# Patient Record
Sex: Male | Born: 1945
Health system: Southern US, Community
[De-identification: ages and names within clinical notes are randomized; demographics above are authoritative.]

## PROBLEM LIST (undated history)

## (undated) DIAGNOSIS — Z86018 Personal history of other benign neoplasm: Secondary | ICD-10-CM

## (undated) DIAGNOSIS — C3490 Malignant neoplasm of unspecified part of unspecified bronchus or lung: Secondary | ICD-10-CM

## (undated) DIAGNOSIS — R0600 Dyspnea, unspecified: Secondary | ICD-10-CM

## (undated) DIAGNOSIS — I219 Acute myocardial infarction, unspecified: Secondary | ICD-10-CM

## (undated) DIAGNOSIS — J9819 Other pulmonary collapse: Secondary | ICD-10-CM

## (undated) DIAGNOSIS — D689 Coagulation defect, unspecified: Secondary | ICD-10-CM

## (undated) DIAGNOSIS — M47816 Spondylosis without myelopathy or radiculopathy, lumbar region: Secondary | ICD-10-CM

## (undated) DIAGNOSIS — J189 Pneumonia, unspecified organism: Secondary | ICD-10-CM

## (undated) DIAGNOSIS — J449 Chronic obstructive pulmonary disease, unspecified: Secondary | ICD-10-CM

## (undated) DIAGNOSIS — J45909 Unspecified asthma, uncomplicated: Secondary | ICD-10-CM

## (undated) DIAGNOSIS — J939 Pneumothorax, unspecified: Secondary | ICD-10-CM

## (undated) DIAGNOSIS — A310 Pulmonary mycobacterial infection: Secondary | ICD-10-CM

## (undated) DIAGNOSIS — J9383 Other pneumothorax: Secondary | ICD-10-CM

## (undated) HISTORY — DX: Spondylosis without myelopathy or radiculopathy, lumbar region: M47.816

## (undated) HISTORY — DX: Pneumothorax, unspecified: J93.9

## (undated) HISTORY — DX: Other pneumothorax: J93.83

## (undated) HISTORY — PX: OTHER SURGICAL HISTORY: SHX169

## (undated) HISTORY — DX: Pneumonia, unspecified organism: J18.9

## (undated) HISTORY — DX: Malignant neoplasm of unspecified part of unspecified bronchus or lung: C34.90

## (undated) HISTORY — DX: Dyspnea, unspecified: R06.00

## (undated) HISTORY — DX: Other pulmonary collapse: J98.19

## (undated) HISTORY — DX: Unspecified asthma, uncomplicated: J45.909

## (undated) HISTORY — DX: Personal history of other benign neoplasm: Z86.018

## (undated) HISTORY — PX: APPENDECTOMY: SHX54

## (undated) HISTORY — DX: Acute myocardial infarction, unspecified: I21.9

## (undated) HISTORY — DX: Chronic obstructive pulmonary disease, unspecified: J44.9

## (undated) HISTORY — DX: Coagulation defect, unspecified: D68.9

## (undated) HISTORY — DX: Pulmonary mycobacterial infection: A31.0

## (undated) HISTORY — PX: CHEST TUBE INSERTION: SHX231

---

## 1999-10-20 ENCOUNTER — Encounter: Payer: Self-pay | Admitting: Thoracic Surgery

## 1999-10-22 ENCOUNTER — Encounter (INDEPENDENT_AMBULATORY_CARE_PROVIDER_SITE_OTHER): Payer: Self-pay | Admitting: *Deleted

## 1999-10-22 ENCOUNTER — Ambulatory Visit (HOSPITAL_COMMUNITY): Admission: RE | Admit: 1999-10-22 | Discharge: 1999-10-22 | Payer: Self-pay | Admitting: Thoracic Surgery

## 1999-10-29 ENCOUNTER — Encounter: Payer: Self-pay | Admitting: Thoracic Surgery

## 1999-10-31 ENCOUNTER — Inpatient Hospital Stay (HOSPITAL_COMMUNITY): Admission: RE | Admit: 1999-10-31 | Discharge: 1999-11-07 | Payer: Self-pay | Admitting: Thoracic Surgery

## 1999-10-31 ENCOUNTER — Encounter: Payer: Self-pay | Admitting: Thoracic Surgery

## 1999-10-31 ENCOUNTER — Encounter (INDEPENDENT_AMBULATORY_CARE_PROVIDER_SITE_OTHER): Payer: Self-pay | Admitting: Specialist

## 1999-11-01 ENCOUNTER — Encounter: Payer: Self-pay | Admitting: Thoracic Surgery

## 1999-11-02 ENCOUNTER — Encounter: Payer: Self-pay | Admitting: Thoracic Surgery

## 1999-11-03 ENCOUNTER — Encounter: Payer: Self-pay | Admitting: Thoracic Surgery

## 1999-11-04 ENCOUNTER — Encounter: Payer: Self-pay | Admitting: Thoracic Surgery

## 1999-11-05 ENCOUNTER — Encounter: Payer: Self-pay | Admitting: Thoracic Surgery

## 1999-11-06 ENCOUNTER — Encounter: Payer: Self-pay | Admitting: Thoracic Surgery

## 1999-11-07 ENCOUNTER — Encounter: Payer: Self-pay | Admitting: Thoracic Surgery

## 1999-11-12 ENCOUNTER — Encounter: Admission: RE | Admit: 1999-11-12 | Discharge: 1999-11-12 | Payer: Self-pay | Admitting: Thoracic Surgery

## 1999-11-12 ENCOUNTER — Encounter: Payer: Self-pay | Admitting: Thoracic Surgery

## 1999-11-26 ENCOUNTER — Encounter: Admission: RE | Admit: 1999-11-26 | Discharge: 1999-11-26 | Payer: Self-pay | Admitting: Thoracic Surgery

## 1999-11-26 ENCOUNTER — Encounter: Payer: Self-pay | Admitting: Thoracic Surgery

## 2000-02-06 ENCOUNTER — Encounter: Admission: RE | Admit: 2000-02-06 | Discharge: 2000-02-06 | Payer: Self-pay | Admitting: Thoracic Surgery

## 2000-02-06 ENCOUNTER — Encounter: Payer: Self-pay | Admitting: Thoracic Surgery

## 2000-05-14 ENCOUNTER — Encounter: Payer: Self-pay | Admitting: Thoracic Surgery

## 2000-05-14 ENCOUNTER — Encounter: Admission: RE | Admit: 2000-05-14 | Discharge: 2000-05-14 | Payer: Self-pay | Admitting: Thoracic Surgery

## 2000-08-13 ENCOUNTER — Encounter: Payer: Self-pay | Admitting: Thoracic Surgery

## 2000-08-13 ENCOUNTER — Encounter: Admission: RE | Admit: 2000-08-13 | Discharge: 2000-08-13 | Payer: Self-pay | Admitting: Thoracic Surgery

## 2000-12-15 ENCOUNTER — Encounter: Payer: Self-pay | Admitting: Thoracic Surgery

## 2000-12-15 ENCOUNTER — Encounter: Admission: RE | Admit: 2000-12-15 | Discharge: 2000-12-15 | Payer: Self-pay | Admitting: Thoracic Surgery

## 2001-06-14 ENCOUNTER — Encounter: Payer: Self-pay | Admitting: Thoracic Surgery

## 2001-06-14 ENCOUNTER — Encounter: Admission: RE | Admit: 2001-06-14 | Discharge: 2001-06-14 | Payer: Self-pay | Admitting: Thoracic Surgery

## 2001-09-14 ENCOUNTER — Encounter: Admission: RE | Admit: 2001-09-14 | Discharge: 2001-09-14 | Payer: Self-pay | Admitting: Thoracic Surgery

## 2001-09-14 ENCOUNTER — Encounter: Payer: Self-pay | Admitting: Thoracic Surgery

## 2002-03-08 ENCOUNTER — Encounter: Admission: RE | Admit: 2002-03-08 | Discharge: 2002-03-08 | Payer: Self-pay | Admitting: Thoracic Surgery

## 2002-03-08 ENCOUNTER — Encounter: Payer: Self-pay | Admitting: Thoracic Surgery

## 2002-07-31 ENCOUNTER — Ambulatory Visit (HOSPITAL_COMMUNITY): Admission: RE | Admit: 2002-07-31 | Discharge: 2002-07-31 | Payer: Self-pay | Admitting: Internal Medicine

## 2002-09-14 ENCOUNTER — Encounter: Admission: RE | Admit: 2002-09-14 | Discharge: 2002-09-14 | Payer: Self-pay | Admitting: Thoracic Surgery

## 2002-09-14 ENCOUNTER — Encounter: Payer: Self-pay | Admitting: Thoracic Surgery

## 2002-09-29 ENCOUNTER — Encounter: Payer: Self-pay | Admitting: Thoracic Surgery

## 2002-09-29 ENCOUNTER — Encounter: Admission: RE | Admit: 2002-09-29 | Discharge: 2002-09-29 | Payer: Self-pay | Admitting: Thoracic Surgery

## 2002-10-31 ENCOUNTER — Encounter: Payer: Self-pay | Admitting: Thoracic Surgery

## 2002-10-31 ENCOUNTER — Encounter: Admission: RE | Admit: 2002-10-31 | Discharge: 2002-10-31 | Payer: Self-pay | Admitting: Thoracic Surgery

## 2003-05-17 ENCOUNTER — Encounter: Admission: RE | Admit: 2003-05-17 | Discharge: 2003-05-17 | Payer: Self-pay | Admitting: Thoracic Surgery

## 2003-05-17 ENCOUNTER — Encounter: Payer: Self-pay | Admitting: Thoracic Surgery

## 2003-11-20 ENCOUNTER — Encounter: Admission: RE | Admit: 2003-11-20 | Discharge: 2003-11-20 | Payer: Self-pay | Admitting: Thoracic Surgery

## 2004-05-20 ENCOUNTER — Encounter: Admission: RE | Admit: 2004-05-20 | Discharge: 2004-05-20 | Payer: Self-pay | Admitting: Thoracic Surgery

## 2004-06-04 ENCOUNTER — Ambulatory Visit (HOSPITAL_COMMUNITY): Admission: RE | Admit: 2004-06-04 | Discharge: 2004-06-04 | Payer: Self-pay | Admitting: Internal Medicine

## 2005-04-06 ENCOUNTER — Encounter: Payer: Self-pay | Admitting: General Surgery

## 2005-04-06 ENCOUNTER — Inpatient Hospital Stay (HOSPITAL_COMMUNITY): Admission: EM | Admit: 2005-04-06 | Discharge: 2005-04-09 | Payer: Self-pay | Admitting: Emergency Medicine

## 2005-04-13 ENCOUNTER — Inpatient Hospital Stay: Payer: Self-pay | Admitting: General Surgery

## 2005-04-20 ENCOUNTER — Ambulatory Visit: Payer: Self-pay | Admitting: General Surgery

## 2005-04-24 ENCOUNTER — Inpatient Hospital Stay (HOSPITAL_COMMUNITY): Admission: EM | Admit: 2005-04-24 | Discharge: 2005-05-06 | Payer: Self-pay | Admitting: Emergency Medicine

## 2005-04-25 ENCOUNTER — Ambulatory Visit: Payer: Self-pay | Admitting: Internal Medicine

## 2005-04-30 ENCOUNTER — Encounter (INDEPENDENT_AMBULATORY_CARE_PROVIDER_SITE_OTHER): Payer: Self-pay | Admitting: Specialist

## 2005-05-13 ENCOUNTER — Encounter: Admission: RE | Admit: 2005-05-13 | Discharge: 2005-05-13 | Payer: Self-pay | Admitting: Thoracic Surgery

## 2005-06-03 ENCOUNTER — Encounter: Admission: RE | Admit: 2005-06-03 | Discharge: 2005-06-03 | Payer: Self-pay | Admitting: Thoracic Surgery

## 2005-07-15 ENCOUNTER — Encounter: Admission: RE | Admit: 2005-07-15 | Discharge: 2005-07-15 | Payer: Self-pay | Admitting: Thoracic Surgery

## 2005-07-17 ENCOUNTER — Ambulatory Visit: Payer: Self-pay | Admitting: Internal Medicine

## 2005-09-15 ENCOUNTER — Ambulatory Visit: Payer: Self-pay | Admitting: Internal Medicine

## 2005-10-14 ENCOUNTER — Encounter: Admission: RE | Admit: 2005-10-14 | Discharge: 2005-10-14 | Payer: Self-pay | Admitting: Thoracic Surgery

## 2006-02-17 ENCOUNTER — Encounter: Admission: RE | Admit: 2006-02-17 | Discharge: 2006-02-17 | Payer: Self-pay | Admitting: Thoracic Surgery

## 2006-03-15 ENCOUNTER — Ambulatory Visit: Payer: Self-pay | Admitting: Internal Medicine

## 2006-03-30 ENCOUNTER — Ambulatory Visit: Payer: Self-pay | Admitting: Internal Medicine

## 2006-07-13 ENCOUNTER — Ambulatory Visit: Payer: Self-pay | Admitting: Internal Medicine

## 2007-01-10 ENCOUNTER — Ambulatory Visit: Payer: Self-pay | Admitting: Internal Medicine

## 2007-05-10 ENCOUNTER — Ambulatory Visit: Payer: Self-pay | Admitting: Internal Medicine

## 2007-10-31 DIAGNOSIS — J930 Spontaneous tension pneumothorax: Secondary | ICD-10-CM

## 2007-10-31 DIAGNOSIS — J939 Pneumothorax, unspecified: Secondary | ICD-10-CM | POA: Insufficient documentation

## 2007-11-01 ENCOUNTER — Ambulatory Visit: Payer: Self-pay | Admitting: Internal Medicine

## 2007-11-06 DIAGNOSIS — J449 Chronic obstructive pulmonary disease, unspecified: Secondary | ICD-10-CM

## 2007-11-18 DIAGNOSIS — R0602 Shortness of breath: Secondary | ICD-10-CM

## 2008-04-30 ENCOUNTER — Ambulatory Visit: Payer: Self-pay | Admitting: Internal Medicine

## 2008-04-30 DIAGNOSIS — C3492 Malignant neoplasm of unspecified part of left bronchus or lung: Secondary | ICD-10-CM

## 2008-05-10 ENCOUNTER — Telehealth (INDEPENDENT_AMBULATORY_CARE_PROVIDER_SITE_OTHER): Payer: Self-pay | Admitting: *Deleted

## 2008-07-10 ENCOUNTER — Ambulatory Visit: Payer: Self-pay | Admitting: Internal Medicine

## 2008-07-10 ENCOUNTER — Ambulatory Visit (HOSPITAL_COMMUNITY): Admission: RE | Admit: 2008-07-10 | Discharge: 2008-07-10 | Payer: Self-pay | Admitting: Internal Medicine

## 2008-11-28 ENCOUNTER — Ambulatory Visit: Payer: Self-pay | Admitting: Internal Medicine

## 2008-12-11 ENCOUNTER — Ambulatory Visit: Payer: Self-pay | Admitting: Internal Medicine

## 2009-01-23 ENCOUNTER — Telehealth: Payer: Self-pay | Admitting: Internal Medicine

## 2009-04-11 ENCOUNTER — Ambulatory Visit: Payer: Self-pay | Admitting: Internal Medicine

## 2009-06-04 ENCOUNTER — Ambulatory Visit (HOSPITAL_COMMUNITY): Admission: RE | Admit: 2009-06-04 | Discharge: 2009-06-04 | Payer: Self-pay | Admitting: Internal Medicine

## 2009-10-18 ENCOUNTER — Telehealth: Payer: Self-pay | Admitting: Internal Medicine

## 2009-11-07 ENCOUNTER — Ambulatory Visit: Payer: Self-pay | Admitting: Internal Medicine

## 2010-04-22 ENCOUNTER — Telehealth (INDEPENDENT_AMBULATORY_CARE_PROVIDER_SITE_OTHER): Payer: Self-pay | Admitting: *Deleted

## 2010-06-06 ENCOUNTER — Ambulatory Visit: Payer: Self-pay | Admitting: Internal Medicine

## 2010-10-19 ENCOUNTER — Encounter: Payer: Self-pay | Admitting: Internal Medicine

## 2010-10-28 NOTE — Progress Notes (Signed)
Summary: prescript  Phone Note Call from Patient   Caller: Patient Call For: young Summary of Call: pt need prednisone called to [facility] Initial call taken by: Rickard Patience,  October 18, 2009 8:52 AM  Follow-up for Phone Call        Pt c/o increase SOB and productive cough x 3-4 weeks. Cough is productive with yellow phlegm.  Pt request refill on prednisone. Please advise. Carron Curie CMA  October 18, 2009 9:03 AM allergies: NKDA  Additional Follow-up for Phone Call Additional follow up Details #1::        pred 10 mg, # 20 1 tab four times daily x 2 days, 3 times daily x 2 days, 2 times daily x 2 days, 1 time daily x 2 days  No refill Additional Follow-up by: Waymon Budge MD,  October 18, 2009 9:10 AM    Additional Follow-up for Phone Call Additional follow up Details #2::    rx sent. pt aware.Carron Curie CMA  October 18, 2009 9:26 AM   Prescriptions: PREDNISONE 10 MG TABS (PREDNISONE) 1 tab four times daily x 2 days, 3 times daily x 2 days, 2 times daily x 2 days, 1 time daily x 2 days  #20 x 0   Entered by:   Carron Curie CMA   Authorized by:   Waymon Budge MD   Signed by:   Carron Curie CMA on 10/18/2009   Method used:   [facility] to        [id number]       [address]       [place], [place]  [address]       Ph: [phone] or [phone]       Fax: [phone]   RxID:   [id number]

## 2010-10-28 NOTE — Assessment & Plan Note (Signed)
Summary: F/U 6 MONTHS///KP   Primary Provider/Referring Provider:  Carylon Perches  CC:  6 month follow up-breathing doing better; "cold weather giving me a fit".  History of Present Illness:  11/28/08-  COPD, Hx small cell ca, Hx pneumothorax Notices little wheeze, but stable exertional dyspnea with lifting or steady walking. Combivent used about once daily, not as effective as it used to be. Still using Advair. Got both flu shots .Denies cough/ phlegm, chest pain/ palpitation/ edema.  04/11/09-COPD, hx small cell CA, hx pneumothorax. About to vacation in [place]- at altitude and smokers in casino. Asks prednisone to carry. Feels washed out in the current heat. Remains easily dyspneic with hills and steps. Uses Combivent 1-2 x daily. Coughs occasional scant white mucus. Denies blood, chest pain, purulent, palpitation, glands, edema. PFT- Moderate obstuctive disease with some response to dilator. FEV1/FVC 0.56. - 98/98, 99%; 429 meters. Room air sat today at rest 98%.  .November 07, 2009- COPD, Hx Small Cell Ca, Hx pneumothorax. Cold air tightens him some. Had early flu shot in September and he asked about getting a booster, but he decided he didn't care to do that. . Had a bronchitis needing prednisone a couple of weeks ago, fully resolved and back to baseline. Little cough and phlegm- not much.   Current Medications (verified): 1)  Advair Diskus 250-50 Mcg/dose  Misc (Fluticasone-Salmeterol) .Marland Kitchen.. 1 Puff Two Times A Day 2)  Lortab 5 5-500 Mg  Tabs (Hydrocodone-Acetaminophen) .... As Needed 3)  Combivent 103-18 Mcg/act  Aero (Ipratropium-Albuterol) .... Use As Directed As Needed 4)  Tylenol 325 Mg Tabs (Acetaminophen) .... To Use As Needed  Allergies (verified): No Known Drug Allergies  Past History:  Past Medical History: Last updated: 11/28/2008 DYSPNEA (ICD-786.05) C O P D (ICD-496) SMALL CELL CARCINOMA OF THE LUNG (ICD-162.9) SPONTANEOUS PNEUMOTHORAX (ICD-512.8)  Past  Surgical History: Last updated: 11/01/2007 left Upper lobectomy for Small Cell Lung Cancer Chest tube, VATS R thoracotomy- resection of bullaew Benign thyroid nodule resected appendectomy  Family History: Last updated: 04/30/2008 Mother - heart disease Father - colon cancer  Social History: Last updated: 11/01/2007 Patient states former smoker. 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  The patient denies anorexia, fever, weight loss, weight gain, vision loss, decreased hearing, hoarseness, chest pain, syncope, peripheral edema, prolonged cough, headaches, hemoptysis, abdominal pain, and severe indigestion/heartburn.         Dyspnea on hills.  Vital Signs:  Patient profile:   65 year old male Height:      67 inches Weight:      186.38 pounds O2 Sat:      96 % on Room air Pulse rate:   88 / minute BP sitting:   120 / 80  (left arm) Cuff size:   regular  Vitals Entered By: Reynaldo Minium CMA (November 07, 2009 9:56 AM)  O2 Flow:  Room air CC: 6 month follow up-breathing doing better; "cold weather giving me a fit" Comments Medications reviewed with patient Renold Genta RCP, LPN  November 07, 2009 9:33 AM    Physical Exam  Additional Exam:  General: A/Ox3; pleasant and cooperative, NAD, quiet spoken SKIN: no rash, lesions NODES: no lymphadenopathy HEENT: [place]/AT, EOM- WNL, Conjuctivae- clear, PERRLA, TM-WNL, Nose- clear, Throat- clear and wnl. [place]  II NECK: Supple w/ fair ROM, JVD-  none, normal carotid impulses w/o bruits Thyroid-  CHEST: Clear to P&A HEART: RRR, no m/g/r heard ABDOMEN: Soft and nl;  IRS:WNIO, nl pulses, no edema  NEURO: Grossly intact to observation      Impression & Recommendations:  Problem # 1:  C O P D (ICD-496) Recent bronchitis resolved. Reasonable exercise tolerance and no changes, no  symptoms suggesting cardiac issues. He understands cold air precautions.  Medications Added to Medication List This Visit: 1)  Prednisone 10 Mg Tabs (Prednisone) .Marland Kitchen.. 1 tab four times daily x 2 days, 3 times daily x 2 days, 2 times daily x 2 days, 1 time daily x 2 days  Other Orders: Est. Patient Level II (27035)  Patient Instructions: 1)  Please schedule a follow-up appointment in 6 months. 2)  Call sooner as needed. 3)  Prednisone taper to hold for travel Prescriptions: PREDNISONE 10 MG TABS (PREDNISONE) 1 tab four times daily x 2 days, 3 times daily x 2 days, 2 times daily x 2 days, 1 time daily x 2 days  #20 x 1   Entered and Authorized by:   Waymon Budge MD   Signed by:   Waymon Budge MD on 11/07/2009   Method used:   Print then Give to Patient   RxID:   [id number]

## 2010-10-28 NOTE — Assessment & Plan Note (Signed)
Summary: rov 6 months///kp   Primary Provider/Referring Provider:  Carylon Perches  CC:  6 month follow up visit-slight SOB today(flares up when around perfumes and smoke)..  History of Present Illness:  04/11/09-COPD, hx small cell CA, hx pneumothorax. About to vacation in [place]- at altitude and smokers in casino. Asks prednisone to carry. Feels washed out in the current heat. Remains easily dyspneic with hills and steps. Uses Combivent 1-2 x daily. Coughs occasional scant white mucus. Denies blood, chest pain, purulent, palpitation, glands, edema. PFT- Moderate obstuctive disease with some response to dilator. FEV1/FVC 0.56. - 98/98, 99%; 429 meters. Room air sat today at rest 98%.  June 06, 2010- COPD, hx small Cell CA, hx PTX Had good report on recent phy exam by Dr Ouida Sills. [place], exposed to smoke about 10 days ago- dyspnea persisted for a day or two. He took a prednisone burst. Has taken 2-3 prednisone bursts this year. Feels a little tight today, but no cough or phlegm, chest pain or fever. Feet don't swell. Dyspnea still limiting trying to walk up stairs or hills.  Last Combivent use yesterday evening, with increased use in past week.     Preventive Screening-Counseling & Management  Alcohol-Tobacco     Smoking Status: quit > 6 months     Year Quit: 1996     Pack years: 3 ppd x 15 years  Current Medications (verified): 1)  Advair Diskus 250-50 Mcg/dose  Misc (Fluticasone-Salmeterol) .Marland Kitchen.. 1 Puff Two Times A Day 2)  Lortab 5 5-500 Mg  Tabs (Hydrocodone-Acetaminophen) .... As Needed 3)  Combivent 103-18 Mcg/act  Aero (Ipratropium-Albuterol) .... Use As Directed As Needed 4)  Tylenol 325 Mg Tabs (Acetaminophen) .... To Use As Needed  Allergies (verified): No Known Drug Allergies  Past History:  Family History: Last updated: 04/30/2008 Mother - heart disease Father - colon cancer  Social History: Last updated: 11/01/2007 Patient  states former smoker. 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  The patient denies shortness of breath at rest, productive cough, non-productive cough, coughing up blood, chest pain, irregular heartbeats, acid heartburn, indigestion, loss of appetite, weight change, abdominal pain, difficulty swallowing, sore throat, tooth/dental problems, headaches, nasal congestion/difficulty breathing through nose, and sneezing.    Vital Signs:  Patient profile:   65 year old male Height:      67 inches Weight:      176.13 pounds BMI:     27.69 O2 Sat:      100 % on Room air Pulse rate:   78 / minute BP sitting:   124 / 68  (left arm) Cuff size:   regular  Vitals Entered By: Reynaldo Minium CMA (June 06, 2010 11:03 AM)  O2 Flow:  Room air CC: 6 month follow up visit-slight SOB today(flares up when around perfumes and smoke).   Physical Exam  Additional Exam:  General: A/Ox3; pleasant and cooperative, NAD, quiet spoken SKIN: no rash, lesions NODES: no lymphadenopathy HEENT: [place]/AT, EOM- WNL, Conjuctivae- clear, PERRLA, TM-WNL, Nose- clear, Throat- clear and wnl. Mallampati  II NECK: Supple w/ fair ROM, JVD- none, normal carotid impulses w/o bruits Thyroid-  CHEST: Clear to P&A, distant HEART: RRR, no m/g/r heard ABDOMEN: Soft and nl;  ZOX:WRUE, nl pulses, no edema  NEURO: Grossly intact to observation      Impression & Recommendations:  Problem # 1:  C O P D  (ICD-496) Exacerbation after irritant smoke exposure. We discussed steroids. Prednisone bursts help but he needed refresher discussion on steroid side effects which we did. I will give sample Dulera to see if that can clear him back to comfort without systemic steroids. He would like flu shot.   Problem # 2:  Hx of SMALL CELL CARCINOMA OF THE LUNG (ICD-162.9) No recurrence after VATS resection. Long term surveillance.  Problem # 3:  SPONTANEOUS PNEUMOTHORAX (ICD-512.8) Hx recurrent spontaneous PTX with chest tube. pleurodesis.  Medications Added to Medication List This Visit: 1)  Zithromax Z-pak 250 Mg Tabs (Azithromycin) .... 2 today then one daily  Other Orders: Est. Patient Level III (45409) Flu Vaccine 38yrs + MEDICARE PATIENTS (W1191) Administration Flu vaccine - MCR (Y7829)  Patient Instructions: 1)  Please schedule a follow-up appointment in 6 months. 2)  Try sample Dulera 200-5, 2 puffs and rinse mouth twice every day. When you have used this up, go back to Advair.  3)  Flu vax 4)  Script to hold for antibiotic to take if needed Prescriptions: ZITHROMAX Z-PAK 250 MG TABS (AZITHROMYCIN) 2 today then one daily  #1 pak x 0   Entered and Authorized by:   Waymon Budge MD   Signed by:   Waymon Budge MD on 06/06/2010   Method used:   Print then Give to Patient   RxID:   [id number]    Flu Vaccine Consent Questions     Do you have a history of severe allergic reactions to this vaccine? no    Any prior history of allergic reactions to egg and/or gelatin? no    Do you have a sensitivity to the preservative Thimersol? no    Do you have a past history of Guillan-Barre Syndrome? no    Do you currently have an acute febrile illness? no    Have you ever had a severe reaction to latex? no    Vaccine information given and explained to patient? yes    Are you currently pregnant? no    Lot Number:[id number]   Exp Date:03/28/2011   Site Given  Left Deltoid IMflu   Randell Loop  [facility]  June 06, 2010 12:08 PM

## 2010-10-28 NOTE — Progress Notes (Signed)
Summary: waiting on prednisone  Phone Note Call from Patient Call back at Home Phone [phone]   Caller: Patient Call For: young Summary of Call: pt says pharmacy is waiting to hear back re: [facility]ill[facility] [place] Initial call taken by: Tivis Ringer, CNA,  April 22, 2010 2:26 PM  Follow-up for Phone Call        called and spoke with pt.  pt states pharmacy never got refill that we show in our system from 04-01-2010.  pt states he last had refill on prednisone on 02-03-2010.  pt requesting a refill on the prednisone to have on hand when he [place] trips to the mountains.  will forward message to CY to address.  Arman Filter LPN  April 22, 2010 2:37 PM   Additional Follow-up for Phone Call Additional follow up Details #1::        ok per CY to send rx to pharmacy.  pt aware rx sent.  Aundra Millet Reynolds LPN  April 22, 2010 3:37 PM     Prescriptions: PREDNISONE 10 MG TABS (PREDNISONE) 1 tab four times daily x 2 days, 3 times daily x 2 days, 2 times daily x 2 days, 1 time daily x 2 days  #20 Tablet x 0   Entered by:   Arman Filter LPN   Authorized by:   Waymon Budge MD   Signed by:   Arman Filter LPN on 09/81/1914   Method used:   [facility]        [id number]       [address]       [place], [place]  [address]       Ph: [phone] or [phone]       Fax: [phone]   RxID:   [id number]

## 2010-12-03 ENCOUNTER — Ambulatory Visit (INDEPENDENT_AMBULATORY_CARE_PROVIDER_SITE_OTHER)
Admission: RE | Admit: 2010-12-03 | Discharge: 2010-12-03 | Disposition: A | Discharge status: Home / Self Care | Payer: Self-pay | Source: Ambulatory Visit | Attending: Internal Medicine | Admitting: Internal Medicine

## 2010-12-03 ENCOUNTER — Other Ambulatory Visit: Payer: Self-pay | Admitting: Internal Medicine

## 2010-12-03 ENCOUNTER — Ambulatory Visit (INDEPENDENT_AMBULATORY_CARE_PROVIDER_SITE_OTHER): Payer: Medicare Other | Admitting: Internal Medicine

## 2010-12-03 ENCOUNTER — Encounter: Payer: Self-pay | Admitting: Internal Medicine

## 2010-12-03 DIAGNOSIS — C3490 Malignant neoplasm of unspecified part of unspecified bronchus or lung: Secondary | ICD-10-CM

## 2010-12-03 DIAGNOSIS — J449 Chronic obstructive pulmonary disease, unspecified: Secondary | ICD-10-CM

## 2010-12-09 NOTE — Assessment & Plan Note (Signed)
Summary: follow up   Primary Provider/Referring Provider:  Carylon Perches  CC:  Follow up visit-COPD; slight wheezing, chest tightness, and slight cough-clear in color; had fever on Monday.Marland Kitchen  History of Present Illness:  04/11/09-COPD, hx small cell CA, hx pneumothorax. About to vacation in [place]- at [place] and smokers in [place]. Asks prednisone to carry. Feels washed out in the current heat. Remains easily dyspneic with hills and steps. Uses Combivent 1-2 x daily. Coughs occasional scant white mucus. Denies blood, chest pain, purulent, palpitation, glands, edema. PFT- Moderate obstuctive disease with some response to dilator. FEV1/FVC 0.56. - 98/98, 99%; 429 meters. Room air sat today at rest 98%.  June 06, 2010- COPD, hx small Cell CA, hx PTX Had good report on recent phy exam by Dr Ouida Sills. [place], exposed to smoke about 10 days ago- dyspnea persisted for a day or two. He took a prednisone burst. Has taken 2-3 prednisone bursts this year. Feels a little tight today, but no cough or phlegm, chest pain or fever. Feet don't swell. Dyspnea still limiting trying to walk up stairs or hills.  Last Combivent use yesterday evening, with increased use in past week.   December 03, 2010- COPD, hx small Cell CA, hx PTX Nurse-CC: Follow up visit-COPD; slight wheezing, chest tightness, slight cough-clear in color; had fever on Monday. Acute- 1 week definite cold w/ head anc chest congestion, productive cough, clear mucus nose and chest, some fever/ chill. Took mucinex and tylenol-may have helped. Slowly better. Still tight chest and short of breath. Using Combivent more- 3 to 4 x/ day. Last prednisone was in January.     Preventive Screening-Counseling & Management  Alcohol-Tobacco     Smoking Status: quit > 6 months     Year Quit: 1996     Pack years: 3 ppd x 15 years  Current Medications (verified): 1)  Advair Diskus 250-50 Mcg/dose  Misc  (Fluticasone-Salmeterol) .Marland Kitchen.. 1 Puff Two Times A Day 2)  Lortab 5 5-500 Mg  Tabs (Hydrocodone-Acetaminophen) .... As Needed 3)  Combivent 103-18 Mcg/act  Aero (Ipratropium-Albuterol) .... Use As Directed As Needed 4)  Tylenol 325 Mg Tabs (Acetaminophen) .... To Use As Needed  Allergies (verified): No Known Drug Allergies  Past History:  Past Surgical History: Last updated: 06/06/2010 left Upper lobectomy for Small Cell Lung Cancer Chest tube, VATS R thoracotomy- resection of bullae Benign thyroid nodule resected appendectomy  Family History: Last updated: 04/30/2008 Mother - heart disease Father - colon cancer  Social History: Last updated: 12/03/2010 Patient states former smoker. 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 retired Production designer, theatre/television/film of paper company Married - 1 daughter/ a Engineer, civil (consulting)  Review of Systems      See HPI       The patient complains of shortness of breath with activity, shortness of breath at rest, productive cough, nasal congestion/difficulty breathing through nose, itching, ear ache, hand/feet swelling, joint stiffness or pain, rash, and change in color of mucus.  The patient denies non-productive cough, coughing up blood, chest pain, irregular heartbeats, acid heartburn, indigestion, loss of  appetite, weight change, abdominal pain, difficulty swallowing, sore throat, tooth/dental problems, headaches, sneezing, anxiety, depression, and fever.    Vital Signs:  Patient profile:   65 year old male Height:      67 inches Weight:      183 pounds BMI:     28.77 O2 Sat:      95 % on Room air Pulse rate:   76 /  minute BP sitting:   116 / 78  (left arm) Cuff size:   regular  Vitals Entered By: Reynaldo Minium CMA (December 03, 2010 8:59 AM)  O2 Flow:  Room air CC: Follow up visit-COPD; slight wheezing, chest tightness, slight cough-clear in color; had fever on Monday.   Physical Exam  Additional Exam:  General: A/Ox3; pleasant and cooperative, NAD, quiet spoken SKIN: no rash, lesions NODES: no lymphadenopathy HEENT: [place]/AT, EOM- WNL, Conjuctivae- clear, PERRLA, TM-WNL, Nose- clear, Throat- clear and wnl, not red, no exudate. Mallampati  II NECK: Supple w/ fair ROM, JVD- none, normal carotid impulses w/o bruits Thyroid-  CHEST: Clear to P&A, distant. Dry cough repeatedly, no wheeze.  HEART: RRR, no m/g/r heard ABDOMEN: Soft and nl;  EAV:WUJW, nl pulses, no edema  NEURO: Grossly intact to observation      Impression & Recommendations:  Problem # 1:  C O P D (ICD-496) Acute exacerbation by viral syndrome URI with bronchitis. Not purulent. We discussed options and will respect his lack of reserve by giving neb, depo and script to hold for Z pak.   Problem # 2:  Hx of SMALL CELL CARCINOMA OF THE LUNG (ICD-162.9) No recurrence. No longer being followed formally for this. We will update CXR since last was done 2009.   Medications Added to Medication List This Visit: 1)  Zithromax Z-pak 250 Mg Tabs (Azithromycin) .... 2 today then one daily  Other Orders: Est. Patient Level III (11914) T-2 View CXR (71020TC) Admin of Therapeutic Inj  intramuscular or subcutaneous (78295) Depo- Medrol 80mg  (J1040) Nebulizer Tx (62130) Albuterol Sulfate Sol 1mg  unit dose (Q6578)  Patient Instructions: 1)  Please schedule a follow-up appointment in 6 months. 2)  A chest x-ray has been recommended.  Your imaging study may require preauthorization.  3)  neb a 4)  depo 80 5)  script for Z pak antibiotic to hold Prescriptions: ZITHROMAX Z-PAK 250 MG TABS (AZITHROMYCIN) 2 today then one daily  #1 pak x 0    Entered and Authorized by:   Waymon Budge MD   Signed by:   Waymon Budge MD on 12/03/2010   Method used:   Print then Give to Patient   RxID:   [id number]      Medication Administration  Injection # 1:    Medication: Depo- Medrol 80mg     Diagnosis: C O P D (ICD-496)    Route: SQ    Site: RUOQ gluteus    Exp Date: 03/2013    Lot #: [id number]    Mfr: Pharmacia    Patient tolerated injection without complications    Given by: Reynaldo Minium CMA (December 03, 2010 10:20 AM)  Medication # 1:    Medication: Albuterol Sulfate Sol 1mg  unit dose    Diagnosis: C O P D (ICD-496)    Dose: 1 vial    Route: inhaled    Exp Date: 10-2011    Lot #: [id number]    Mfr: nephron    Patient tolerated medication without complications    Given by: Florentina Addison  Welchel CMA (December 03, 2010 10:21 AM)  Orders Added: 1)  Est. Patient Level III [19147] 2)  T-2 View CXR [71020TC] 3)  Admin of Therapeutic Inj  intramuscular or subcutaneous [96372] 4)  Depo- Medrol 80mg  [J1040] 5)  Nebulizer Tx [82956] 6)  Albuterol Sulfate Sol 1mg  unit dose [O1308]

## 2011-02-10 NOTE — Assessment & Plan Note (Signed)
[facility]                             PULMONARY OFFICE NOTE   LIBAN, GUEDES                        MRN:          [id number]  DATE:05/10/2007                            DOB:          Jul 12, 1946    PROBLEM:  1. Chronic obstructive pulmonary disease.  2. Recurrent spontaneous pneumothorax.  3. Left upper lobectomy for small cell cancer (Dr. Edwyna Shell).   HISTORY:  Complaints of persistent exertional dyspnea, but says physical  exam went okay.  Occasional cough, comes and goes, scant phlegm, no  edema, bilateral rib pains reflecting old surgery.  He finds he prefers  Combivent over other inhalers, and I discussed change in propellants.   MEDICATIONS:  1. Advair 250/50.  2. Combivent inhaler.   ALLERGIES:  NO MEDICATION ALLERGY.   OBJECTIVE:  VITAL SIGNS:  Weight 176 pounds, BP 124/68, pulse 85, room  air saturation 97%, inspiratory wheeze almost sounds laryngeal, raising  possibility of ECD.  There is some petechiae on his arms, no adenopathy,  no edema.  HEART:  Sounds regular without murmur.   IMPRESSION:  1. Chronic obstructive pulmonary disease, status post bilateral      lobectomy.  2. Cancer with recurrent pneumothorax.   PLAN:  Continue Advair 250/50, and at his request he is given a  comparison sample Advair metered inhaler, 115/21 two puffs b.i.d.  Schedule pulmonary function test.  Pneumococcal vaccine booster.  Schedule return 6 months, earlier p.r.n.     Clinton D. Maple Hudson, MD, Tonny Bollman, FACP  Electronically Signed    CDY/MedQ  DD: 05/14/2007  DT: 05/15/2007  Job #: [id number]   cc:   Kingsley Callander. Ouida Sills, MD

## 2011-02-10 NOTE — Op Note (Signed)
NAMEBENNET, [name]                 ACCOUNT NO.:  [id number]   MEDICAL RECORD NO.:  [id number]          PATIENT TYPE:  AMB   LOCATION:  DAY                           FACILITY:  [facility]   PHYSICIAN:  R. Roetta Sessions, M.D. DATE OF BIRTH:  [birth year]   DATE OF PROCEDURE:  DATE OF DISCHARGE:                               OPERATIVE REPORT   INDICATIONS FOR PROCEDURE:  A 65 year old gentleman with history of  colonic adenoma back in 1998.  His last exam was by Dr. Dionicia Abler in 2003,  was found to have diverticulosis and diminutive rectosigmoid polyps  which were ablated.  He has no lower GI symptoms currently.  He is here  for surveillance.  Risks, benefits, alternatives, and limitations have  been reviewed, questions answered, is agreeable.  Please see  documentation in the medical record.   PROCEDURE NOTE:  O2 saturation, blood pressure, pulse, respirations  monitored throughout the entire procedure.   CONSCIOUS SEDATION:  Versed 4 mg IV, Demerol 75 mg IV in divided doses.   INSTRUMENTS:  Pentax video chip system.   FINDINGS:  Digital rectal exam revealed no abnormalities.  Endoscopic  Findings:  Prep was adequate.  Colon:  Colonic mucosa was surveyed from  the rectosigmoid junction through the left transverse right colon  appendiceal orifice, ileocecal valve, and cecum.  These structures were  well seen and photographed for the record.  From this level, the scope  was slowly and cautiously withdrawn.  All previously mentioned mucosal  surfaces were again seen.  The patient had scattered left-sided  diverticula and the colonic mucosa appeared normal.  Scope was pulled  down the rectum where a thorough examination of the rectal mucosa  including retroflexed anal verge demonstrated no abnormalities.  The  patient tolerated the procedure well and was reacted in Endoscopy.   IMPRESSION:  1. Normal rectum.  2. Left-sided diverticulum and colonic mucosa appeared normal.   RECOMMENDATIONS:  1. Diverticulosis, literature provided to [name].  2. Repeat colonoscopy in 5 years.      Jonathon Bellows, M.D.  Electronically Signed     RMR/MEDQ  D:  07/10/2008  T:  07/10/2008  Job:  [id number]   cc:   Kingsley Callander. Ouida Sills, MD  Fax: [phone]

## 2011-02-13 NOTE — Assessment & Plan Note (Signed)
[facility]                               PULMONARY OFFICE NOTE   [name], [name]                        MRN:          [id number]  DATE:07/13/2006                            DOB:          1946/07/09    PULMONARY FOLLOW-UP:   PROBLEMS:  1. Chronic obstructive pulmonary disease.  2. Recurrent spontaneous pneumothorax.  3. Left upper lobectomy for small cell cancer (Dr. Edwyna Shell).   HISTORY:  Wheezing and exertional dyspnea vary, mainly with weather changes.  He is trying to walk a mile a day.  He says he had a recent good report on  his physical exam.  Had pneumococcal vaccine about 4 years ago but would  like a flu shot now.  Occasional dry cough, little sputum, no chest pain or  palpitation.  He remains a little sore, as expected, at his old thoracotomy  site.  Dr. Edwyna Shell had done his last chest x-ray on may.   MEDICATIONS:  1. Spiriva.  2. Lopressor 25 mg b.i.d.  3. Foradil b.i.d.  4. Albuterol rescue inhaler.   No medication allergy.   OBJECTIVE:  VITAL SIGNS:  Weight 184 pounds, pulse regular at 76, room air  saturation 98%.  CHEST:  Breath sounds are diminished and clear.  He coughed once while he  was with me, but it did not sound especially congested.  CARDIAC:  Heart sounds regular without murmur.  LYMPHATIC:  There is no adenopathy.   IMPRESSION:  Mainly chronic obstructive pulmonary disease, now status post  lobectomy for his cancer but stable.   PLAN:  Keep walking.  Flu vaccine was given with discussion.  Plan return  about 6 months, anticipating chest x-ray once a year or so, earlier p.r.n.       Clinton D. Maple Hudson, MD, FCCP, FACP   CDY/MedQ DD:  07/17/2006 DT:  07/19/2006 Job #:  [id number]   cc:   Kingsley Callander. Ouida Sills, MD  Ines Bloomer, M.D.

## 2011-02-13 NOTE — Op Note (Signed)
[name], [name]                 ACCOUNT NO.:  [id number]   MEDICAL RECORD NO.:  [id number]          PATIENT TYPE:  INP   LOCATION:  3305                         FACILITY:  [facility]   PHYSICIAN:  Ines Bloomer, M.D. DATE OF BIRTH:  10/24/1945   DATE OF PROCEDURE:  04/30/2005  DATE OF DISCHARGE:                                 OPERATIVE REPORT   PREOPERATIVE DIAGNOSIS:  Persistent right pneumothorax secondary to bullous  disease.   POSTOPERATIVE DIAGNOSIS:  Persistent right pneumothorax secondary to bullous  disease.   OPERATION PERFORMED:  Right video assisted thoracoscopic surgery with  resection of right upper lobe bullae with pleurectomy, pleurodesis, mini-  thoracotomy.   SURGEON:  Ines Bloomer, M.D.   FIRST ASSISTANT:  Kennis Carina, RNFA   ANESTHESIA:  General.   After the percutaneous insertion of all monitoring lines, the patient  underwent general anesthesia and was turned to the right lateral thoracotomy  position.  A dual lumen tube was inserted.  He was prepped and draped in the  usual sterile manner.  Two trocar sites were made, a posterior trocar site  at the seventh intercostal space at the posterior axillary line, and a  trocar was inserted, and a 0 degree scope was inserted.  You could see some  adhesions of the lung to the anterior chest wall as well as to the apex and  could see several bullae, the largest being the posterior segment of the  right upper lobe.  Another trocar site was made at the anterior axillary  line through a previous chest tube site and another trocar was inserted.  The lung was then taken down using sharp and blunt dissection and we had to  use the hook scissors with electrocautery to take down the apical adhesions.  After this had been done, an anterior mini-thoracotomy was done over the  fifth intercostal space with the latissimus being only slightly divided,  approximately 1 cm, and the serratus anterior spread, and the fifth  intercostal space entered, and a Tuffier placed in the space.  This allowed  [place] to identify the posterior segment bullae and this was resected with the  TLC-75 stapler with Peri-Guard reinforcement and the EZ-45 stapler with Peri-  Guard reinforcement.  Then, there were several bullae in the apex and we  checked for a leak and there was a leak from this area, so we had to take  down all the apical adhesions and resected the apical bullae, two areas,  with the EZ-45 stapler with Peri-Guard reinforcement.  One area was still  leaking and this was stapled with a 45 stapler.  The pleura was removed from  the first 5-6 ribs circumferentially with sharp dissection and then a  pleurodesis was done of the lower ribs using both RDS sponges and a Bovie  scraper.  Two chest tubes were brought through the trocar sites and tied in  place with 0 silk.  The On-Q  catheter was placed under direct vision with the scope in the subpleural  space and a Marcaine block was done in the usual fashion.  The chest was  closed with four pericostals, #1 Vicryl in the muscle layer, 2-0 Vicryl in  the subcutaneous tissue, and Ethicon skin clips.  The returned to the  recovery room in stable condition.       DPB/MEDQ  D:  04/30/2005  T:  04/30/2005  Job:  [id number]   cc:   Joni Fears D. Maple Hudson, M.D.

## 2011-02-13 NOTE — H&P (Signed)
[name], [name]                 ACCOUNT NO.:  [id number]   MEDICAL RECORD NO.:  [id number]          PATIENT TYPE:  INP   LOCATION:  1825                         FACILITY:  [facility]   PHYSICIAN:  Ines Bloomer, M.D. DATE OF BIRTH:  Jan 03, 1946   DATE OF ADMISSION:  04/24/2005  DATE OF DISCHARGE:                                HISTORY & PHYSICAL   ADMIT DIAGNOSES:  Shortness of breath.   HISTORY OF PRESENT ILLNESS:  This is a 65 year old Caucasian male with a  history of resection of a left upper lobe secondary to non-small-cell lung  cancer.  Patient also has a history of recurrent right spontaneous  pneumothoraces.  This happened twice in the past.  This morning the patient  became very dyspneic with minimal exertion.  He had no chest pain or  shortness of breath at rest.  Patient had presented to the emergency room as  he is familiar with symptoms he experienced in the past with pneumothoraces.  The patient had a chest x-ray in the emergency room.  This revealed a  greater than 40% right spontaneous pneumothorax.  Dr. Edwyna Shell of the [facility]  service was consulted regarding chest tube placement.  Dr. Edwyna Shell evaluated  the patient and placed a chest tube.  The patient was also taken for chest  CT, results of which are pending at this time.  Patient will be admitted to  Dr. Scheryl Darter service for routine care of his chest tube.  Patient denies  cough, sputum production, hemoptysis, paroxysmal nocturnal dyspnea,  orthopnea, fever, chills, weight loss, reflux symptoms, peripheral edema,  angina, palpitations, arrhythmias, and TIA/CVA symptoms.   PAST MEDICAL HISTORY:  1.  Spontaneous right pneumothorax x2.  2.  COPD.  3.  Irritable bowel syndrome.  4.  Lung cancer, non-small-cell, status post left upper lobectomy.  5.  Colon adenoma.  6.  Benign thyroid tumor.   PAST SURGICAL HISTORY:  1.  Left upper lobe lobectomy.  2.  Appendectomy.  3.  Thyroid tumor resection.   ALLERGIES:  No  known drug allergies.   MEDICATIONS:  1.  Advair 100 mcg b.i.d.  2.  Combivent two puffs q.i.d.   REVIEW OF SYSTEMS:  Please see HPI for significant positives and negatives.  Otherwise, negative for renal disease, diabetes mellitus.   SOCIAL HISTORY:  This is a married male with one child who lives with his  family.  He stopped smoking tobacco in 1995 and denies drinking alcohol.  Patient is retired and he does continue to drive.   FAMILY HISTORY:  Noncontributory.   PHYSICAL EXAMINATION:  VITAL SIGNS:  Blood pressure 117/72, heart rate 79,  O2 saturation 90% on 2 L.  GENERAL:  This is a 65 year old Caucasian male in no acute distress.  HEENT:  Normocephalic, atraumatic.  Pupils are equal, round, and reactive to  light and accommodation.  Extraocular movements are intact.  The oral mucosa  is pink and moist.  The sclerae are non-icteric.  NECK:  Supple with no JVD, no lymphadenopathy, and no bruits.  The carotids  are easily palpable.  LUNGS:  Respirations are unlabored and clear on the left.  They are  diminished throughout the right lung field and the right chest tube is in  place.  CARDIAC:  Regular rate and rhythm.  No murmurs, rubs, or gallops.  ABDOMEN:  Soft, nontender, nondistended with normoactive bowel sounds.  There are no masses palpated.  GENITOURINARY:  Deferred.  RECTAL:  Deferred.  EXTREMITIES:  There is mild ankle edema present bilaterally.  There are no  varicosities or venous stasis changes.  Temperature is warm.  PULSES:  Radial, femoral, popliteal, and pedal pulses are 2+ bilaterally.  NEUROLOGIC:  Nonfocal.  Patient is alert and oriented x3.  Gait is  unobserved.  Muscle strength is 5/5 throughout and symmetric.  Deep tendon  reflexes are 2+.   ASSESSMENT:  Right spontaneous pneumothorax, recurrent.   PLAN:  Placement of right chest tube, chest CT, and admit for routine care  of chest tube.      Aman   AY/MEDQ  D:  04/24/2005  T:  04/24/2005  Job:   [id number]

## 2011-02-13 NOTE — H&P (Signed)
[name], [name]                 ACCOUNT NO.:  [id number]   MEDICAL RECORD NO.:  [id number]          PATIENT TYPE:  INP   LOCATION:  A319                          FACILITY:  [facility]   PHYSICIAN:  Kingsley Callander. Ouida Sills, MD       DATE OF BIRTH:  1946/04/13   DATE OF ADMISSION:  04/06/2005  DATE OF DISCHARGE:  LH                                HISTORY & PHYSICAL   CHIEF COMPLAINT:  Can't breathe.   HISTORY OF PRESENT ILLNESS:  This patient is a 65 year old white male with a  history of lung cancer who presented with difficulty breathing.  He had  awakened feeling as though he could not breathe normally.  He was seen at  the office and was obviously dyspneic and was sent to the emergency room for  definitive evaluation and treatment.  He was found to have dull breath  sounds on the right side at the office and was felt to possibly have a  pneumothorax.  His chest x-ray confirmed this, and Dr. Lovell Sheehan was consulted  and a chest tube was placed in the emergency room.  He has a history of  COPD.  He had a left upper lobectomy in February 2001 for lung cancer and  has had no recurrent cancer since that time.  His previous CT had revealed  numerous bullae and subpleural blebs.   PAST MEDICAL HISTORY:  1.  Lung cancer.  2.  COPD.  3.  Colon adenoma.  4.  IBS.  5.  Thyroid tumor.  6.  Appendectomy.   MEDICATIONS:  1.  Advair 100 mcg b.i.d.  2.  Combivent two puffs q.i.d.   ALLERGIES:  None.   SOCIAL HISTORY:  He is a former smoker.  He does not smoke now, nor does he  drink alcohol.   FAMILY HISTORY:  His mother died at 19 of stroke.  She had also had an MI.  A brother died at 48 of an MI.  Sister has had endometrial cancer.   REVIEW OF SYSTEMS:  Noncontributory.   PHYSICAL EXAMINATION:  VITAL SIGNS:  Temperature 98.3, blood pressure  146/102, pulse 90, respirations 16, oxygen saturation 95% on 2 L.  HEENT:  The eyes and oropharynx are unremarkable.  NECK:  No JVD or thyromegaly.  CHEST:  Lungs clear.  Initially dull on the right side.  No wheezes.  Chest  has a left thoracotomy scar.  CARDIAC:  Heart initially tachycardic but now normal, regular, no murmurs.  ABDOMEN:  Nontender, no hepatosplenomegaly.  EXTREMITIES:  No cyanosis, clubbing or edema.  NEUROLOGIC:  Intact.  GENITOURINARY, RECTAL:  He had a normal prostate exam and heme-negative  stool on July 6.   LABORATORY DATA:  His chest x-ray revealed a 40% right-sided pneumothorax.  ABG revealed a pH of 7.46, PCO2 29, PO2 of 58.  EKG revealed sinus  tachycardia at 100 beats per minute.   IMPRESSION:  1.  Right pneumothorax.  As stated, Dr. Lovell Sheehan was consulted and a chest      tube was placed.  There was full inflation of  the right lung following      that, and he had relief of his symptoms.  He will be hospitalized for      further chest tube management.  2.  History of lung cancer.  No sign of recurrence.  3.  Chronic obstructive pulmonary disease.  Continue Advair and Combivent.       ROF/MEDQ  D:  04/07/2005  T:  04/07/2005  Job:  [id number]

## 2011-02-13 NOTE — Consult Note (Signed)
[name], [name]                 ACCOUNT NO.:  [id number]   MEDICAL RECORD NO.:  [id number]          PATIENT TYPE:  INP   LOCATION:  A319                          FACILITY:  [facility]   PHYSICIAN:  Dalia Heading, M.D.  DATE OF BIRTH:  [birth year]   DATE OF CONSULTATION:  04/06/2005  DATE OF DISCHARGE:                                   CONSULTATION   SURGERY CONSULTATION:   DATE OF CONSULTATION:  April 06, 2005   REASON FOR CONSULTATION:  Right pneumothorax.   REFERRING PHYSICIAN:  Dr. Carylon Perches   HISTORY OF PRESENT ILLNESS:  Patient is a 65 year old white male status post  left upper lobectomy 5 years ago by Dr. Edwyna Shell in [place] for lung  carcinoma who complained of shortness of breath earlier today.  He was seen  by Dr. Carylon Perches who noticed decreased breath sounds on the right side.  A  chest x-ray was then performed which revealed a 40% pneumothorax with mild  mediastinal shift.  Patient was sent to the emergency room.   PAST MEDICAL AND SURGICAL HISTORY:  See Dr. Alonza Smoker H&P.   ALLERGIES:  No known drug allergies.   PHYSICAL EXAMINATION:  On physical examination, patient is a pleasant 59-  year-old white male in no acute distress.  Oxygen saturations are at 93% on  3 L nasal cannula.  Lung exam revealed decreased breath sounds on the right  side.   PROCEDURE NOTE:  Informed consent was obtained from the patient.  The right  midaxillary line at the fifth intercostal space was cleaned with ChloraPrep.  One percent Xylocaine was used for local anesthesia.  A small-bore chest  tube was then inserted into the right pleural space without difficulty.  This was connected to a Pleurovac.  Initially, air did leak through the  waterseal chamber, but this subsequently stopped.  The patient did have some  coughing at the end of procedure.  The patient tolerated the procedure well.   A chest x-ray after chest tube placement showed adequate position of the  chest tube with full  resolution of the right pneumothorax.   IMPRESSION:  Spontaneous right pneumothorax, history of left lung cancer.   PLAN:  The patient will be admitted by Dr. Ouida Sills for further management and  treatment.  I will follow the patient with him during the patient's stay.       MAJ/MEDQ  D:  04/06/2005  T:  04/06/2005  Job:  [id number]   cc:   Kingsley Callander. Ouida Sills, MD  [address]  [place]  [place] [address]  Fax: [phone]   Ines Bloomer, M.D.  [address]  [place]  [place] [address]

## 2011-02-13 NOTE — Op Note (Signed)
[place]. [facility]  Patient:    BARRET, ESQUIVEL                      MRN: [id number] Attending:  D. Karle Plumber, M.D. CC:         Algis Downs Karle Plumber, M.D.                           Operative Report  PREOPERATIVE DIAGNOSIS:  Non-small cell cancer, left upper lobe.  POSTOPERATIVE DIAGNOSIS:  Non-small cell cancer, left upper lobe.  OPERATION:  Fiberoptic bronchoscopy and mediastinoscopy.  SURGEON:  D. Karle Plumber, M.D.  ANESTHESIA:  General anesthesia.  DESCRIPTION OF PROCEDURE:  After general anesthesia, the fiberoptic bronchoscope was passed through the endotracheal tube and the right upper lobe, right middle  lobe and right lower lobe orifices were normal.  The left upper lobe and left lower lobe orifices were normal.  The carina was in the midline and the distal trachea was normal.  The fiberoptic bronchoscope was removed.  The anterior neck was prepped and draped in the usual sterile manner.  The patient had a previous thyroidectomy scar in the midportion and thyroidectomy scar was opened for approximately 2 to 3 cm.  Dissection was carried down to the subcutaneous tissue, the strap muscles were slit and the pretracheal fascia was entered.  Digital exploration was carried out and then a video mediastinoscope was inserted and then 2R, 4R and 4L nodes were biopsied; these were enlarged nodes that appeared to be more inflammatory but were definitely enlarged.  Strap muscles were closed with 2-0 Vicryl, subcutaneous tissue with 3-0 Vicryl and Steri-Strips.  The patient was returned to the recovery room in stable condition. DD:  10/22/99 TD:  10/23/99 Job: [id number]

## 2011-02-13 NOTE — Procedures (Signed)
[place]. [facility]  Patient:    [name]                         MRN: [id number] Proc. Date: 10/31/99 Adm. Date:  [id number] Attending:  Cameron Proud CC:         Norton Blizzard, M.D.             Dept of Anesthesiology                           Procedure Report  PROCEDURE PERFORMED:  ANESTHESIOLOGIST:  Edwin Cap. Zoila Shutter, M.D.  HISTORY:  The patient is a 65 year old white male with a diagnosis of carcinoma of the lung scheduled for thoracotomy, lobectomy and for whom epidural analgesia in the postoperative period has been requested, explained, understood and accepted  preoperatively as part of the medical surgical management.  DESCRIPTION OF PROCEDURE:  After the termination of surgery while still under general anesthesia, [name] remained in the right lateral decubitus position.  His back was prepped with Betadine and draped in the usual sterile fashion. Using loss of resistance technique and a midline approach, a peridural tap was accomplished at the T12-L1 interspace with a 17 gauge Tuohy needle.  After aspiration for blood and CSF were negative, a total of 10 cc of 0.5% lidocaine ith 75 mcg of fentanyl was infused with no problems.  This was followed by the passage of a peridural catheter 10 cm cephalad.  The catheter was then checked for patency and affixed to the patients back.  The patient was then returned to the supine position, awakened and brought to the post anesthesia care unit where his peridural catheter will be connected to an infusion pump with a mixture of fentanyl 5 mcg per cc and Marcaine 1/16% at an initial rate of 12 cc per hour to be adjusted as indicated.  There were no complications.  He did well and will be followed in the usual fashion. DD:  10/31/99 TD:  11/01/99 Job: [id number]

## 2011-02-13 NOTE — Consult Note (Signed)
[name], [name]                 ACCOUNT NO.:  [id number]   MEDICAL RECORD NO.:  [id number]          PATIENT TYPE:  INP   LOCATION:  3311                         FACILITY:  [facility]   PHYSICIAN:  Clinton D. Maple Hudson, M.D. DATE OF BIRTH:  1946/03/22   DATE OF CONSULTATION:  DATE OF DISCHARGE:                                   CONSULTATION   DATE OF PULMONARY CONSULTATION:  April 25, 2005.   PROBLEM FOR CONSULTATION:  A 65 year old former smoker seen at the request  of Dr. Edwyna Shell in Pulmonary consultation because of recurrent pneumothorax,  COPD, and pneumonia.   HISTORY:  This gentleman had a left upper lobectomy for non-small-cell  carcinoma around 1994 without adjuvant therapy.  He had quit smoking about  four years prior.  He was admitted with a new spontaneous right pneumothorax  and a history of two prior pneumothoraces.  A chest tube has been placed.  He understands that surgery is anticipated for definitive control.  Radiology during this admission most recently has demonstrated the  decreasing right pneumothorax.  Chest CT yesterday showed numerous, large,  peripheral blebs, right greater than left lung with no recurrence of  previous left cancer, but a left precarinal lymph node, which appears to  have enlarged.  He also now has a left lower lobe pneumonia with effusion.   REVIEW OF SYSTEMS:  He feels fairly comfortable currently, able to sit up in  a chair and watch TV.  He is coughing a little.  Baseline function is  dyspnea climbing hills and stairs.  He does not usually cough but does  sometimes wheeze.  Little sputum at home.  He had not been having exertional  or pleuritic pains, fevers or sweats, bleeding, nausea or vomiting.   FAMILY HISTORY:  Heart disease.  Nobody with pneumothorax.   SOCIAL HISTORY:  He quit cigarettes around 1990.  A retired Production designer, theatre/television/film of a  Research scientist (medical).   OBJECTIVE:  GENERAL:  Alert, pleasant, moderately obese man sitting upright  in a chair,  conversational.  Right chest tube, nasal prong oxygen.  VITAL SIGNS:  Temperature 97.9, BP 120/65, pulse regular 75, oxygen  saturation 97% on 2 liters.  SKIN:  No rash.  ADENOPATHY:  None found.  HEENT:  Speech clear, no neck vein distention or stridor.  LUNGS:  Lung fields are clear posteriorly.  Anterior upper zone expiratory  wheezes, left greater than right.  HEART:  Heart sounds are regular, normal S1 and S2, no murmur or gallop.  ABDOMEN:  Quiet, not distended, nontender.  EXTREMITIES:  No cyanosis, clubbing, or edema.   LABORATORY DATA:  Labs from April 24, 2005, include a white count of 10,600  with a hemoglobin of 12.8.  Glucose 108.  Renal function normal.  Albumin  3.7.  Bilirubin 1.3.   MEDICATIONS:  Completing his first hospital day of Maxipime.  Getting  nebulized Albuterol q.i.d. and a flutter valve.   IMPRESSION:  1.  Recurrent pneumothoraces reflecting bullous emphysema.  Bronchodilators      are appropriate, and he may benefit from adding Spiriva.  Ultimately,  pleurodesis/bulectomy may be the best course, if appropriate.  It will      help to order an alpha 1A trypsin level, recognizing that result will      not return during this hospitalization.  2.  Precarinal lymph node for Dr. Scheryl Darter consideration.  3.  Chronic obstructive pulmonary disease.  Outpatient Advair and Combivent      are appropriate.  Once stabilized after this hospitalization, he will      need pulmonary function tests before and after bronchodilator, and      measured lung volumes.  In the meantime, we will an add empiric trial of      Spiriva while continuing his nebulizer.  4.  Left lower lobe pneumonia covered appropriately with Maxipime pending      culture result.  5.  Left lower lobe effusion consistent with parapneumonic, although I do      not know how long it has been present.  It should be tapped if it grows.      If secondary to pneumonia, it may clear spontaneously with  antibiotics.       CDY/MEDQ  D:  04/25/2005  T:  04/25/2005  Job:  [id number]   cc:   Kingsley Callander. Ouida Sills, MD  [address]  [place]  [place] [address]  Fax: [phone]

## 2011-02-13 NOTE — Op Note (Signed)
. [facility]  Patient:    [name], [name]                       MRN: [id number] Attending:  D. Karle Plumber, M.D. Dictator:   D. Karle Plumber, M.D. CC:         Algis Downs Karle Plumber, M.D.             Carylon Perches, M.D. in [place]                           Operative Report  PREOPERATIVE DIAGNOSIS:  Non-small cell lung cancer, left upper lobe.  POSTOPERATIVE DIAGNOSIS:  Non-small cell lung cancer, left upper lobe.  OPERATIONS:  Left VATS, left upper lobectomy.  SURGEON:  Dr. Norton Blizzard.  ASSISTANT:  Eugenia Pancoast, P.A.  ANESTHESIA:  General.  DESCRIPTION OF PROCEDURE:  After proper insertion of all monitor lines the patient underwent general anesthesia.  He was prepped and draped in the usual sterile manner.  Was turned in the left lateral thoracotomy position.  Two trocars were  adequately placed in the anterior and posterior axillary line at the seventh intercostal space.  Two trocars were inserted.  A 30 degree scope was inserted, and the patient was found to have cancer was stuck to the anterior chest wall in the left upper lobe and the second and third ribs.  For this reason, I decided to do a posterolateral thoracotomy in order to resect this off the chest wall.  A posterolateral incision was made.  Subcutaneous tissue and latissimus was divided with electrocautery.  The serratus was split superiorly.  A portion of the sixth rib was taken subperiosteally posteriorly at the angle to finish that it was inserted, and then a ______ was placed at right angle.  The cancer in the left upper lobe was taken down with electrocautery for resection, and then dissection was started in the hilum, dissecting out a #6 and #5 node, and then dissecting p the fissure, and partially divided the fissure with an Autosuture 45 stapler which exposed the anterior branch to the left upper lobe.  This was ligated proximally with 2-0 silk,  clipped proximally and distally, and divided.  Then dissection was carried up to the apical posterior branch.  It was dissected out, stapled with he Autosuture stapler and divided.  A 10R node was taken in the dissection off the  bronchus, and dissection was going inferiorly, and the lingular branch was dissected out loop, then stapled with an Autosuture stapler.  This exposed the est of the fissure which was divided throughout the case, looped with vascular tape, and with an Systems analyst.  Then the superior pulmonary vein was resected ut, looped with the vascular tape and divided with an Systems analyst, divided with an Systems analyst.  The bronchus was stapled with an ethicon TL30 and divided distally.  Bronchial margins were negative.  There was a non-small lung cancer.  The area was irrigated copiously.  Another 11L node was taken, and then the inferior pulmonary ligament was taken down.  The patient had an emphysematous bleb and superior segment.  This was stapled with one application of the Autosuture 0 stapler with ______ guard reinforcement.  Three chest tubes were placed through  the trocar sites and tied in place with 0 silk.  The chest was closed with three pericostal #1 Dexon in the muscle layer, 2-0 Vicryl in  subcutaneous tissue, and  Ethicon skin clips.  The patient was taken to the recovery room in stable condition. DD:  10/31/99 TD:  11/01/99 Job: [id number]

## 2011-02-13 NOTE — Discharge Summary (Signed)
NAMEJOHNATHEN, TESTA                 ACCOUNT NO.:  [id number]   MEDICAL RECORD NO.:  [id number]          PATIENT TYPE:  INP   LOCATION:  A319                          FACILITY:  [facility]   PHYSICIAN:  Kingsley Callander. Ouida Sills, MD       DATE OF BIRTH:  [birth year]   DATE OF ADMISSION:  04/06/2005  DATE OF DISCHARGE:  07/13/2006LH                                 DISCHARGE SUMMARY   DISCHARGE DIAGNOSES:  1.  Spontaneous right pneumothorax.  2.  Chronic obstructive pulmonary disease with bullous disease.  3.  History of left upper lobectomy for lung cancer in February 2001.  4.  Irritable bowel syndrome.  5.  Colon adenoma.  6.  History of benign thyroid tumor.   DISCHARGE MEDICATIONS:  1.  Advair 100 mcg b.i.d.  2.  Combivent 2 puffs q.i.d. p.r.n.   HOSPITAL COURSE:  This patient is a 65 year old male who presented with  difficulty breathing.  He was found to have a 40% right-sided pneumothorax.  He had known bullous disease.  He was felt to have likely popped a bleb.  His ABG revealed a pH of 7.46, pCO2 29, pO2 of 58.  Dr.  Lovell Sheehan was  consulted and a chest tube was placed.  His lung reinflated nicely and  remained reinflated.  The chest tube was converted to water seal on July 12.  The tube was removed on July 13.  His lung remained well inflated and he was  stable for discharge on July 13.   FOLLOW UP:  He will follow up in the office in 1 week with a chest x-ray  then.       ROF/MEDQ  D:  04/09/2005  T:  04/09/2005  Job:  [id number]

## 2011-02-13 NOTE — Op Note (Signed)
NAMEHARRY, [name]                           ACCOUNT NO.:  [id number]   MEDICAL RECORD NO.:  [id number]                   PATIENT TYPE:  AMB   LOCATION:  DAY                                  FACILITY:  [facility]   PHYSICIAN:  Lionel December, M.D.                 DATE OF BIRTH:  1945/12/19   DATE OF PROCEDURE:  07/31/2002  DATE OF DISCHARGE:                                 OPERATIVE REPORT   PROCEDURE:  Total colonoscopy.   ENDOSCOPIST:  Lionel December, M.D.   INDICATIONS:  This patient is a 65 year old Caucasian male with a history of  colonic polyps.  His last exam was in December 1998.  While he does not have  any GI symptoms, he did have a left upper lobectomy fro a carcinoma, about 3  years ago, and remains in remission.  His family history is negative for  colorectal carcinoma.   PREOPERATIVE MEDICATIONS:  Demerol 25 mg IV and Versed 6 mg IV.   INSTRUMENT:  Olympus video system.   FINDINGS:  Procedure performed in endoscopy suite.  The patient's vital  signs and O2 saturation were monitored during the procedure and remained  stable.  The patient was placed in the left lateral recumbent position and  rectal examination was performed.  This was within normal limits.   The scope was placed in the rectum and advanced under vision into the  sigmoid colon and beyond.  Preparation was satisfactory to fair.  Still a  lot of thick liquid stool was scattered in his colon.  The scope was passed  to the cecum which was identified by ileocecal valve and appendiceal  orifice.  Pictures were taken for the record.  As the scope was withdrawn  the mucosa was carefully examined.  There were a few tiny diverticula in the  sigmoid colon.  Rectal mucosa was normal except in the distal segment where  there were multiple small polyps.  All of these were coagulated using snare  tip.  No tissue was taken for biopsy.  He also had hemorrhoids below the  dentate line.   Endoscope was straightened and  withdrawn.  The patient tolerated the  procedure well.   FINAL DIAGNOSES:  1. Examination performed to the cecum.  2. A few tiny diverticula at the sigmoid colon.  3. Multiple small polyps of the rectum were coagulated using snare tip.  4. Still small external hemorrhoids.    RECOMMENDATIONS:  Standard instructions given.  High fiber diet.  He should  consider the next exam in 5 years from now.                                                Lionel December, M.D.    NR/MEDQ  D:  07/31/2002  T:  07/31/2002  Job:  [id number]   cc:   Kingsley Callander. Ouida Sills, M.D.  [address]  [place]  [place] [address]  Fax: [phone]

## 2011-02-13 NOTE — Discharge Summary (Signed)
NAMEBRYTON, [name]                 ACCOUNT NO.:  [id number]   MEDICAL RECORD NO.:  [id number]          PATIENT TYPE:  INP   LOCATION:  3305                         FACILITY:  [facility]   PHYSICIAN:  Ines Bloomer, M.D. DATE OF BIRTH:  03-28-1946   DATE OF ADMISSION:  04/24/2005  DATE OF DISCHARGE:                                 DISCHARGE SUMMARY   ADMISSION DIAGNOSES:  Right spontaneous pneumothorax.   PAST MEDICAL HISTORY AND DISCHARGE DIAGNOSES:  1.  Chronic obstructive pulmonary disease, emphysematous.  2.  Irritable bowel syndrome.  3.  Lung cancer, non-small-cell, status post left upper lobectomy.  4.  Colon adenoma.  5.  Benign thyroid tumor, status post resection.  6.  Appendectomy.  7.  Spontaneous right pneumothorax X3, status post chest tube placement and      status post right video-assisted thoracoscopic surgery,  right      thoracotomy and resection of right upper lobe bullae.  8.  Community acquired pneumonia, resolved.   ALLERGIES:  No known drug allergies.   BRIEF HISTORY:  The patient is a 65 year old Caucasian male with a history  of non-small-cell lung cancer, status post resection of left upper lobe.  He  also has a history of recurrent spontaneous pneumothorax X2.  On the morning  of admission the patient became very dyspneic with minimal exertion in the  absence of chest pain.  The patient recognized these symptoms as similar to  those he had experienced in the past with previous pneumothoraces and  therefore he presented to the emergency room.  Chest x-ray in the emergency  room revealed a greater than 40% right spontaneous pneumothorax and  therefore Dr. Edwyna Shell and the CVTS service was consulted.  Dr. Edwyna Shell placed  a chest tube and admitted the patient for routine care.  A chest CT scan was  also ordered.   HOSPITAL COURSE:  The patient was admitted via the emergency room as  previously stated for spontaneous right pneumothorax after chest tube  placement.  The patient was initially monitored and then a chest CT scan was  performed.  This revealed a residual 30% anterior pneumothorax and a left  lower lobe infiltrate as well as multiple bullae in the right lung.  Secondary to the new diagnosis of community acquired pneumonia, any surgery  was postponed.  The patient was started on Maxipime and the chest tube was  continued to suction.   A pulmonary consult by Dr. Maple Hudson was obtained on April 25, 2005.  It was his  opinion that the patient would likely require pleurodesis and embolectomy.  He also started the patient on Spiriva.  The Maxipime was continued.  The  patient's pneumonia subsequently resolved with antibiotic treatment.  The  patient's chest x-ray remained stable with no air leak and his pneumothorax  had resolved.  Secondary to this his chest tube was discontinued and he was  monitored closely.   On the morning following chest tube removal, chest x-ray revealed an  increase in the right pneumothorax nondistended the patient was short of  breath.  Therefore, a video-assisted  thorascopic surgery with thoracotomy  and bullae resection was scheduled.  The patient was taken to the operating  room on April 30, 2005 for right video-assisted thorascopic surgery, right  thoracotomy, resection of right upper lobe bullae and pleurectomy.  The  patient tolerated the procedures well and was hemodynamically stable  immediately postoperatively.  The patient was transferred from the operating  room to the PACU in stable condition.  Patient was extubated without  complications, woke up from anesthesia neurologically intact.   On postoperative day #1 the patient's only complaint was of soreness.  His  vital signs were stable with the exception of a sinus tachycardia with a  heart rate of 120.  He was started on a low dose beta blocker for this.  His  heart rate has remained stable since that time.  His chest x-ray revealed a  decrease  in the right pneumothorax.  The chest tube was continued to suction  secondary to a 1 to 2 out of 7 air leak.   The remainder of the patient's postoperative course has progressed as  expected.  His anterior/posterior tube was discontinued in a routine manner  and he tolerated this well.  On postoperative day #4 the anterior tube was  switched to a PneumoSTAT and again he tolerated this well.  The patient was  ambulating well and his chest x-ray remained stable with no change in his  small right apical pneumothorax.  There was no air leak present.  On  postoperative day #5 the patient's chest tube was discontinued and follow up  chest x-ray revealed no pneumothorax.  The patient is in stable at this time  and as along as he continues to progress in the current manner should be  ready for discharge within the next one to two days pending morning rounds  re-evaluations.   LABORATORY DATA:  CBC on May 05, 2005 revealed white blood cell count of  11.2, hemoglobin 10.2, hematocrit 29.5, platelet count 346,000. BMP on  May 04, 2005 with sodium 134, potassium 3.6, BUN 10, creatinine 0.8,  glucose 123.   CONDITION ON DISCHARGE:  Improved.   DISCHARGE INSTRUCTIONS:  1.  Medications:      1.  Tylox one to two q.4-6h. PRN pain.      2.  Mucinex 600 mg b.i.d.      3.  Spiriva 18 mcg inhaled daily.      4.  Lopressor 25 mg b.i.d.  2.  Diet is low salt, low fat.  3.  Activity:  Patient should increase his activity slowly.  No driving for      two weeks and no lifting for three weeks.  4.  Wound care:  Clean site daily with soap and water.  If wound problems      arise he should contact the CVTS office.   FOLLOW UP:  Follow up appointment with [facility] for PA and lateral  chest x-ray on May 13, 2005 at 12:50.  Dr. Edwyna Shell on May 13, 2005 at  1:50.  Dr. Maple Hudson:  Patient will be instructed to call his office for a  follow up appointment.      [place]  AY/MEDQ  D:  05/05/2005   T:  July 15, 202006  Job:  [id number]   cc:   Joni Fears D. Maple Hudson, M.D.

## 2011-02-13 NOTE — Assessment & Plan Note (Signed)
[facility]                             PULMONARY OFFICE NOTE   LORA, GLOMSKI                        MRN:          [id number]  DATE:01/10/2007                            DOB:          Jan 24, 1946    PROBLEMS:  1. Chronic obstructive pulmonary disease.  2. Recurrent spontaneous pneumothorax.  3. Left upper lobectomy for small cell cancer (Dr. Cyndra Numbers).   HISTORY:  Increased shortness of breath in the last month or so. Spiriva  is not helping as much. He asks return to Advair which he thinks worked  better. There has been no sudden event chest pain or palpitation.  Nothing bloody or purulent.   MEDICATIONS:  1. Spiriva.  2. Lopressor 25 mg b.i.d.  3. Foradil with rescue Pro Air inhaler.   ALLERGIES:  None.   OBJECTIVE:  Weight 189 pounds, blood pressure 122/78, pulse regular 73,  room air saturation 98%. Dry cough, diminished breath sounds. No  difference in breath sounds between the two sides. Unlabored. No  adenopathy. No edema.   IMPRESSION:  Asthma with chronic obstructive pulmonary disease, mild  exacerbation; history of lobectomy for cancer.   PLAN:  1. Stop Foradil and Spiriva as currently unhelpful.  2. Return to Advair 250/50 once b.i.d. per discussion.  3. Chest x-ray.  4. Change his Pro Air inhaler to a Combivent inhaler 2 puffs daily      p.r.n.  5. Schedule return six months, earlier p.r.n.     Clinton D. Maple Hudson, MD, Tonny Bollman, FACP  Electronically Signed    CDY/MedQ  DD: 01/15/2007  DT: 01/16/2007  Job #: [id number]   cc:   Kingsley Callander. Ouida Sills, MD

## 2011-03-03 ENCOUNTER — Other Ambulatory Visit: Payer: Self-pay | Admitting: Internal Medicine

## 2011-03-12 ENCOUNTER — Other Ambulatory Visit: Payer: Self-pay | Admitting: *Deleted

## 2011-03-12 MED ORDER — PREDNISONE 10 MG PO TABS: ORAL_TABLET | ORAL | Status: DC

## 2011-03-25 ENCOUNTER — Other Ambulatory Visit: Payer: Self-pay | Admitting: Internal Medicine

## 2011-06-08 ENCOUNTER — Ambulatory Visit (INDEPENDENT_AMBULATORY_CARE_PROVIDER_SITE_OTHER): Payer: Medicare Other | Admitting: Internal Medicine

## 2011-06-08 ENCOUNTER — Encounter: Payer: Self-pay | Admitting: Internal Medicine

## 2011-06-08 VITALS — BP 118/74 | HR 74 | Ht 67.0 in | Wt 184.8 lb

## 2011-06-08 DIAGNOSIS — C3490 Malignant neoplasm of unspecified part of unspecified bronchus or lung: Secondary | ICD-10-CM

## 2011-06-08 DIAGNOSIS — J4489 Other specified chronic obstructive pulmonary disease: Secondary | ICD-10-CM

## 2011-06-08 DIAGNOSIS — J449 Chronic obstructive pulmonary disease, unspecified: Secondary | ICD-10-CM

## 2011-06-08 DIAGNOSIS — Z23 Encounter for immunization: Secondary | ICD-10-CM

## 2011-06-08 MED ORDER — TETANUS-DIPHTH-ACELL PERTUSSIS 5-2.5-18.5 LF-MCG/0.5 IM SUSP: 0.5000 mL | Freq: Once | INTRAMUSCULAR | Status: DC

## 2011-06-08 MED ORDER — FLUTICASONE-SALMETEROL 500-50 MCG/DOSE IN AEPB: 1.0000 | INHALATION_SPRAY | Freq: Two times a day (BID) | RESPIRATORY_TRACT | Status: DC

## 2011-06-08 MED ORDER — PREDNISONE 10 MG PO TABS: ORAL_TABLET | ORAL | Status: DC

## 2011-06-08 NOTE — Assessment & Plan Note (Addendum)
Stable, except that he has occasional need for prednisone. I will see if increase to stronger Advair makes cost-effective, systemic steroid sparing benefit.  He asked about pertussis vaccine and we will give TDAP and flu vaccine today with discusion.

## 2011-06-08 NOTE — Assessment & Plan Note (Signed)
No recurrence as of latest CXR, but at long term risk for new neoplasm.

## 2011-06-08 NOTE — Patient Instructions (Signed)
Sample and script changing Advair to 500-50  Script prednisone taper to hold  Flu vax  TDAP vax

## 2011-06-08 NOTE — Progress Notes (Signed)
Subjective:    Patient ID: [name], male    DOB: [birth year], 65 y.o.   MRN: [id number]  HPI 06/08/11- 65 year old male former smoker followed for COPD, history of small cell cancer, history of pneumothorax.   PCP is Dr Carylon Perches Last here December 03, 2010- Since then had routine physical w/ Dr Ouida Sills- okay except for the breathing part. He did use stand-by prednisone 3 months ago on trip to mountains and would like to keep some on hand.  He is okay today, asking about pertussis vaccine and wants flu vax. Last tetanus about 3 years ago- discussed. Has had more than one pneumovax. Had shingles vaccine.  Expects wheeze and dyspnea with exertion- unchanged. Spiriva didn't help- made him worse.  Review of Systems Constitutional:   No-   weight loss, night sweats, fevers, chills, fatigue, lassitude. HEENT:   No-  headaches, difficulty swallowing, tooth/dental problems, sore throat,       No-  sneezing, itching, ear ache, nasal congestion, post nasal drip,  CV:  No-   chest pain, orthopnea, PND, swelling in lower extremities, anasarca, dizziness, palpitations Resp: Persistent shortness of breath with exertion not at rest.              No-   productive cough,  No non-productive cough,  No-  coughing up of blood.              No-   change in color of mucus.  No- wheezing.   Skin: No-   rash or lesions. GI:  No-   heartburn, indigestion, abdominal pain, nausea, vomiting, diarrhea,                 change in bowel habits, loss of appetite GU: No-   dysuria, change in color of urine, no urgency or frequency.  No- flank pain. MS:  No-   joint pain or swelling.  No- decreased range of motion.  No- back pain. Neuro- grossly normal to observation, Or:  Psych:  No- change in mood or affect. No depression or anxiety.  No memory loss.      Objective:   Physical Exam  General- Alert, Oriented, Affect-appropriate, Distress- none acute  Abdominal obesity Skin- rash-none, lesions- none, excoriation-  none Lymphadenopathy- none Head- atraumatic            Eyes- Gross vision intact, PERRLA, conjunctivae clear secretions            Ears- Hearing, canals normal            Nose- Clear, No-Septal dev, mucus, polyps, erosion, perforation             Throat- Mallampati II , mucosa clear , drainage- none, tonsils- atrophic Neck- flexible , trachea midline, no stridor , thyroid nl, carotid no bruit Chest - symmetrical excursion , unlabored           Heart/CV- RRR , no murmur , no gallop  , no rub, nl s1 s2                           - JVD- none , edema- none, stasis changes- none, varices- none           Lung- faint expiratory wheeze, LUL, cough- light , dullness-none, rub- none           Chest wall-  Abd- tender-no, distended-no, bowel sounds-present, HSM- no Br/ Gen/ Rectal- Not done, not indicated Extrem- cyanosis- none, clubbing,  none, atrophy- none, strength- nl Neuro- grossly intact to observation       Assessment & Plan:

## 2011-07-21 ENCOUNTER — Other Ambulatory Visit: Payer: Self-pay | Admitting: Internal Medicine

## 2011-09-17 ENCOUNTER — Other Ambulatory Visit: Payer: Self-pay | Admitting: Internal Medicine

## 2011-09-29 HISTORY — PX: CARDIAC CATHETERIZATION: SHX172

## 2011-12-07 ENCOUNTER — Ambulatory Visit (INDEPENDENT_AMBULATORY_CARE_PROVIDER_SITE_OTHER): Payer: Medicare Other | Admitting: Internal Medicine

## 2011-12-07 ENCOUNTER — Ambulatory Visit (INDEPENDENT_AMBULATORY_CARE_PROVIDER_SITE_OTHER)
Admission: RE | Admit: 2011-12-07 | Discharge: 2011-12-07 | Disposition: A | Discharge status: Home / Self Care | Payer: Medicare Other | Source: Ambulatory Visit | Attending: Internal Medicine | Admitting: Internal Medicine

## 2011-12-07 ENCOUNTER — Encounter: Payer: Self-pay | Admitting: Internal Medicine

## 2011-12-07 VITALS — BP 126/76 | HR 80 | Ht 67.0 in | Wt 183.6 lb

## 2011-12-07 DIAGNOSIS — J984 Other disorders of lung: Secondary | ICD-10-CM | POA: Diagnosis not present

## 2011-12-07 DIAGNOSIS — J45909 Unspecified asthma, uncomplicated: Secondary | ICD-10-CM | POA: Diagnosis not present

## 2011-12-07 DIAGNOSIS — J449 Chronic obstructive pulmonary disease, unspecified: Secondary | ICD-10-CM | POA: Diagnosis not present

## 2011-12-07 DIAGNOSIS — C3490 Malignant neoplasm of unspecified part of unspecified bronchus or lung: Secondary | ICD-10-CM | POA: Diagnosis not present

## 2011-12-07 DIAGNOSIS — Z85118 Personal history of other malignant neoplasm of bronchus and lung: Secondary | ICD-10-CM

## 2011-12-07 MED ORDER — PREDNISONE 10 MG PO TABS: ORAL_TABLET | ORAL | Status: DC

## 2011-12-07 MED ORDER — FLUTICASONE-SALMETEROL 250-50 MCG/DOSE IN AEPB: 1.0000 | INHALATION_SPRAY | Freq: Two times a day (BID) | RESPIRATORY_TRACT | Status: DC

## 2011-12-07 NOTE — Patient Instructions (Signed)
Order- CXR- dx COPD, hx lung Ca  Scripts sent for Advair 250 and for prednisone taper  Please call as needed

## 2011-12-07 NOTE — Progress Notes (Signed)
Patient ID: [name], male    DOB: [birth year], 66 y.o.   MRN: [id number]  HPI 06/08/11- 66 year old male former smoker followed for COPD, history of small cell cancer, history of pneumothorax.   PCP is Dr Carylon Perches Last here December 03, 2010- Since then had routine physical w/ Dr Ouida Sills- okay except for the breathing part. He did use stand-by prednisone 3 months ago on trip to mountains and would like to keep some on hand.  He is okay today, asking about pertussis vaccine and wants flu vax. Last tetanus about 3 years ago- discussed. Has had more than one pneumovax. Had shingles vaccine.  Expects wheeze and dyspnea with exertion- unchanged. Spiriva didn't help- made him worse.   12/07/11-06/08/11- 66 year old male former smoker followed for COPD, history of small cell cancer, history of pneumothorax.   PCP is Dr Carylon Perches Did well through the winter until he caught a cold last week. Coughing clear mucus. Denies fever or purulent sputum. Feels he just needs more time to clear this. Used up last standby prescription for prednisone one month ago. Discussed change of delivery device for Combivent. Advair 250 works as well as Advair 500.  Review of Systems- see HPI Constitutional:   No-   weight loss, night sweats, fevers, chills, fatigue, lassitude. HEENT:   No-  headaches, difficulty swallowing, tooth/dental problems, sore throat,       No-  sneezing, itching, ear ache, nasal congestion, post nasal drip,  CV:  No-   chest pain, orthopnea, PND, swelling in lower extremities, anasarca, dizziness, palpitations Resp:  +Persistent shortness of breath with exertion not at rest.              +  productive cough,  No non-productive cough,  No-  coughing up of blood.              No-   change in color of mucus.  No- wheezing.   Skin: No-   rash or lesions. GI:  No-   heartburn, indigestion, abdominal pain, nausea, vomiting, GU: . MS:  No-   joint pain or swelling.   Neuro- grossly normal to observation, Or:   Psych:  No- change in mood or affect. No depression or anxiety.  No memory loss.      Objective:   Physical Exam  General- Alert, Oriented, Affect-appropriate, Distress- none acute  Abdominal obesity Skin- rash-none, lesions- none, excoriation- none Lymphadenopathy- none Head- atraumatic            Eyes- Gross vision intact, PERRLA, conjunctivae clear secretions            Ears- Hearing, canals normal            Nose- Clear, No-Septal dev, mucus, polyps, erosion, perforation             Throat- Mallampati II , mucosa clear , drainage- none, tonsils- atrophic Neck- flexible , trachea midline, no stridor , thyroid nl, carotid no bruit Chest - symmetrical excursion , unlabored           Heart/CV- RRR , no murmur , no gallop  , no rub, nl s1 s2                           - JVD- none , edema- none, stasis changes- none, varices- none           Lung- faint expiratory wheeze, very distant, LUL, cough- light , dullness-none, rub- none  Chest wall-  Abd-  Br/ Gen/ Rectal- Not done, not indicated Extrem- cyanosis- none, clubbing, none, atrophy- none, strength- nl Neuro- grossly intact to observation

## 2011-12-11 ENCOUNTER — Encounter: Payer: Self-pay | Admitting: Internal Medicine

## 2011-12-11 NOTE — Assessment & Plan Note (Addendum)
Upper respiratory infection with bronchitis, viral pattern, resolving without antibiotics. Acute exacerbation of COPD. Change to Advair 250, refill prednisone taper to hold.

## 2011-12-11 NOTE — Assessment & Plan Note (Signed)
Plan-chest x-ray 

## 2012-02-16 DIAGNOSIS — M545 Low back pain: Secondary | ICD-10-CM | POA: Diagnosis not present

## 2012-02-16 DIAGNOSIS — G894 Chronic pain syndrome: Secondary | ICD-10-CM | POA: Diagnosis not present

## 2012-02-16 DIAGNOSIS — M47817 Spondylosis without myelopathy or radiculopathy, lumbosacral region: Secondary | ICD-10-CM | POA: Diagnosis not present

## 2012-03-03 ENCOUNTER — Other Ambulatory Visit: Payer: Self-pay | Admitting: Internal Medicine

## 2012-03-07 DIAGNOSIS — M62838 Other muscle spasm: Secondary | ICD-10-CM | POA: Diagnosis not present

## 2012-03-07 DIAGNOSIS — M48062 Spinal stenosis, lumbar region with neurogenic claudication: Secondary | ICD-10-CM | POA: Diagnosis not present

## 2012-03-07 DIAGNOSIS — IMO0002 Reserved for concepts with insufficient information to code with codable children: Secondary | ICD-10-CM | POA: Diagnosis not present

## 2012-03-07 DIAGNOSIS — M5137 Other intervertebral disc degeneration, lumbosacral region: Secondary | ICD-10-CM | POA: Diagnosis not present

## 2012-03-08 DIAGNOSIS — M47817 Spondylosis without myelopathy or radiculopathy, lumbosacral region: Secondary | ICD-10-CM | POA: Diagnosis not present

## 2012-03-08 DIAGNOSIS — IMO0002 Reserved for concepts with insufficient information to code with codable children: Secondary | ICD-10-CM | POA: Diagnosis not present

## 2012-03-08 DIAGNOSIS — M5137 Other intervertebral disc degeneration, lumbosacral region: Secondary | ICD-10-CM | POA: Diagnosis not present

## 2012-03-08 DIAGNOSIS — M62838 Other muscle spasm: Secondary | ICD-10-CM | POA: Diagnosis not present

## 2012-04-11 DIAGNOSIS — M62838 Other muscle spasm: Secondary | ICD-10-CM | POA: Diagnosis not present

## 2012-04-11 DIAGNOSIS — M545 Low back pain: Secondary | ICD-10-CM | POA: Diagnosis not present

## 2012-04-11 DIAGNOSIS — M48061 Spinal stenosis, lumbar region without neurogenic claudication: Secondary | ICD-10-CM | POA: Diagnosis not present

## 2012-04-11 DIAGNOSIS — G894 Chronic pain syndrome: Secondary | ICD-10-CM | POA: Diagnosis not present

## 2012-04-11 DIAGNOSIS — M5137 Other intervertebral disc degeneration, lumbosacral region: Secondary | ICD-10-CM | POA: Diagnosis not present

## 2012-05-05 ENCOUNTER — Other Ambulatory Visit: Payer: Self-pay | Admitting: Internal Medicine

## 2012-05-05 NOTE — Telephone Encounter (Signed)
Ok to refill 

## 2012-05-05 NOTE — Telephone Encounter (Signed)
CY-please advise if okay to refill. Thanks.  

## 2012-05-09 ENCOUNTER — Inpatient Hospital Stay: Payer: Self-pay | Admitting: Internal Medicine

## 2012-05-09 DIAGNOSIS — R918 Other nonspecific abnormal finding of lung field: Secondary | ICD-10-CM | POA: Diagnosis not present

## 2012-05-09 DIAGNOSIS — R748 Abnormal levels of other serum enzymes: Secondary | ICD-10-CM | POA: Diagnosis not present

## 2012-05-09 DIAGNOSIS — R0602 Shortness of breath: Secondary | ICD-10-CM | POA: Diagnosis not present

## 2012-05-09 DIAGNOSIS — Z87891 Personal history of nicotine dependence: Secondary | ICD-10-CM | POA: Diagnosis not present

## 2012-05-09 DIAGNOSIS — Z800 Family history of malignant neoplasm of digestive organs: Secondary | ICD-10-CM | POA: Diagnosis not present

## 2012-05-09 DIAGNOSIS — I214 Non-ST elevation (NSTEMI) myocardial infarction: Secondary | ICD-10-CM | POA: Diagnosis not present

## 2012-05-09 DIAGNOSIS — Z823 Family history of stroke: Secondary | ICD-10-CM | POA: Diagnosis not present

## 2012-05-09 DIAGNOSIS — R079 Chest pain, unspecified: Secondary | ICD-10-CM | POA: Diagnosis not present

## 2012-05-09 DIAGNOSIS — R062 Wheezing: Secondary | ICD-10-CM | POA: Diagnosis not present

## 2012-05-09 DIAGNOSIS — Z85118 Personal history of other malignant neoplasm of bronchus and lung: Secondary | ICD-10-CM | POA: Diagnosis not present

## 2012-05-09 DIAGNOSIS — I517 Cardiomegaly: Secondary | ICD-10-CM | POA: Diagnosis not present

## 2012-05-09 DIAGNOSIS — J441 Chronic obstructive pulmonary disease with (acute) exacerbation: Secondary | ICD-10-CM | POA: Diagnosis not present

## 2012-05-09 DIAGNOSIS — R799 Abnormal finding of blood chemistry, unspecified: Secondary | ICD-10-CM | POA: Diagnosis not present

## 2012-05-09 DIAGNOSIS — D72829 Elevated white blood cell count, unspecified: Secondary | ICD-10-CM | POA: Diagnosis not present

## 2012-05-09 DIAGNOSIS — Z8520 Personal history of malignant neoplasm of unspecified respiratory organ: Secondary | ICD-10-CM | POA: Diagnosis not present

## 2012-05-09 DIAGNOSIS — J449 Chronic obstructive pulmonary disease, unspecified: Secondary | ICD-10-CM | POA: Diagnosis not present

## 2012-05-09 DIAGNOSIS — Z8249 Family history of ischemic heart disease and other diseases of the circulatory system: Secondary | ICD-10-CM | POA: Diagnosis not present

## 2012-05-09 DIAGNOSIS — R6889 Other general symptoms and signs: Secondary | ICD-10-CM | POA: Diagnosis not present

## 2012-05-09 DIAGNOSIS — R0989 Other specified symptoms and signs involving the circulatory and respiratory systems: Secondary | ICD-10-CM | POA: Diagnosis not present

## 2012-05-09 DIAGNOSIS — Z79899 Other long term (current) drug therapy: Secondary | ICD-10-CM | POA: Diagnosis not present

## 2012-05-09 LAB — CBC
HCT: 39.7 % — ABNORMAL LOW (ref 40.0–52.0)
MCH: 30.1 pg (ref 26.0–34.0)
MCHC: 32.8 g/dL (ref 32.0–36.0)
MCV: 92 fL (ref 80–100)
RDW: 14.2 % (ref 11.5–14.5)
WBC: 14.7 10*3/uL — ABNORMAL HIGH (ref 3.8–10.6)

## 2012-05-09 LAB — COMPREHENSIVE METABOLIC PANEL
Albumin: 3.9 g/dL (ref 3.4–5.0)
Anion Gap: 9 (ref 7–16)
BUN: 16 mg/dL (ref 7–18)
Bilirubin,Total: 0.4 mg/dL (ref 0.2–1.0)
Chloride: 105 mmol/L (ref 98–107)
Co2: 23 mmol/L (ref 21–32)
Creatinine: 1.19 mg/dL (ref 0.60–1.30)
EGFR (African American): >60
EGFR (Non-African Amer.): >60
Glucose: 179 mg/dL — ABNORMAL HIGH (ref 65–99)
Osmolality: 279 (ref 275–301)
Potassium: 4.4 mmol/L (ref 3.5–5.1)
Total Protein: 7.5 g/dL (ref 6.4–8.2)

## 2012-05-09 LAB — CK TOTAL AND CKMB (NOT AT ARMC)
CK, Total: 272 U/L — ABNORMAL HIGH (ref 35–232)
CK-MB: 9.4 ng/mL — ABNORMAL HIGH (ref 0.5–3.6)

## 2012-05-09 LAB — TROPONIN I: Troponin-I: 1.41 ng/mL — ABNORMAL HIGH

## 2012-05-09 LAB — PROTIME-INR: Prothrombin Time: 11.8 secs (ref 11.5–14.7)

## 2012-05-10 DIAGNOSIS — I517 Cardiomegaly: Secondary | ICD-10-CM

## 2012-05-10 DIAGNOSIS — R0602 Shortness of breath: Secondary | ICD-10-CM | POA: Diagnosis not present

## 2012-05-10 DIAGNOSIS — I214 Non-ST elevation (NSTEMI) myocardial infarction: Secondary | ICD-10-CM

## 2012-05-10 LAB — CK TOTAL AND CKMB (NOT AT ARMC)
CK, Total: 277 U/L — ABNORMAL HIGH (ref 35–232)
CK-MB: 15 ng/mL — ABNORMAL HIGH (ref 0.5–3.6)

## 2012-05-10 LAB — CBC WITH DIFFERENTIAL/PLATELET
Basophil %: 0.1 %
Eosinophil %: 0 %
HCT: 42.6 % (ref 40.0–52.0)
Lymphocyte #: 0.9 10*3/uL — ABNORMAL LOW (ref 1.0–3.6)
MCH: 29.1 pg (ref 26.0–34.0)
MCV: 92 fL (ref 80–100)
Monocyte #: 0.6 x10 3/mm (ref 0.2–1.0)
Monocyte %: 2.5 %
Neutrophil #: 23.7 10*3/uL — ABNORMAL HIGH (ref 1.4–6.5)
Platelet: 391 10*3/uL (ref 150–440)
RBC: 4.64 10*6/uL (ref 4.40–5.90)

## 2012-05-10 LAB — TROPONIN I: Troponin-I: 1.66 ng/mL — ABNORMAL HIGH

## 2012-05-10 LAB — BASIC METABOLIC PANEL
Anion Gap: 10 (ref 7–16)
BUN: 17 mg/dL (ref 7–18)
Calcium, Total: 9.7 mg/dL (ref 8.5–10.1)
Chloride: 101 mmol/L (ref 98–107)
Co2: 28 mmol/L (ref 21–32)
Creatinine: 1.1 mg/dL (ref 0.60–1.30)
EGFR (Non-African Amer.): >60
Potassium: 4.2 mmol/L (ref 3.5–5.1)

## 2012-05-25 ENCOUNTER — Encounter: Payer: Self-pay | Admitting: Cardiovascular Disease

## 2012-05-27 DIAGNOSIS — Z79899 Other long term (current) drug therapy: Secondary | ICD-10-CM | POA: Diagnosis not present

## 2012-05-27 DIAGNOSIS — E785 Hyperlipidemia, unspecified: Secondary | ICD-10-CM | POA: Diagnosis not present

## 2012-05-27 DIAGNOSIS — E039 Hypothyroidism, unspecified: Secondary | ICD-10-CM | POA: Diagnosis not present

## 2012-05-27 DIAGNOSIS — J449 Chronic obstructive pulmonary disease, unspecified: Secondary | ICD-10-CM | POA: Diagnosis not present

## 2012-05-27 DIAGNOSIS — Z125 Encounter for screening for malignant neoplasm of prostate: Secondary | ICD-10-CM | POA: Diagnosis not present

## 2012-06-03 ENCOUNTER — Encounter: Payer: Self-pay | Admitting: Cardiovascular Disease

## 2012-06-03 ENCOUNTER — Ambulatory Visit (INDEPENDENT_AMBULATORY_CARE_PROVIDER_SITE_OTHER): Payer: Medicare Other | Admitting: Cardiovascular Disease

## 2012-06-03 VITALS — BP 160/80 | HR 85 | Ht 66.0 in | Wt 183.2 lb

## 2012-06-03 DIAGNOSIS — I10 Essential (primary) hypertension: Secondary | ICD-10-CM

## 2012-06-03 DIAGNOSIS — I214 Non-ST elevation (NSTEMI) myocardial infarction: Secondary | ICD-10-CM | POA: Diagnosis not present

## 2012-06-03 DIAGNOSIS — I2109 ST elevation (STEMI) myocardial infarction involving other coronary artery of anterior wall: Secondary | ICD-10-CM

## 2012-06-03 DIAGNOSIS — J4489 Other specified chronic obstructive pulmonary disease: Secondary | ICD-10-CM

## 2012-06-03 DIAGNOSIS — J449 Chronic obstructive pulmonary disease, unspecified: Secondary | ICD-10-CM

## 2012-06-03 MED ORDER — ATORVASTATIN CALCIUM 10 MG PO TABS: 10.0000 mg | ORAL_TABLET | Freq: Every day | ORAL | Status: DC

## 2012-06-03 NOTE — Assessment & Plan Note (Signed)
Recent non-ST elevation MI in the setting of severe choking on a vitamin pill. Cardiac catheterization showing minimal coronary artery disease. He is on aspirin and statin.

## 2012-06-03 NOTE — Progress Notes (Signed)
Patient ID: [name], male    DOB: [birth year], 66 y.o.   MRN: [id number]  HPI Comments: [name] is a pleasant 66 year old gentleman with history of lung cancer, COPD who had a non-ST elevation MI after choking on a vitamin pill, admission to [facility] 05/09/2012 with peak troponin 1.5. He had a cardiac catheterization that showed nonocclusive coronary artery disease, several regions of 20% stenosis otherwise normal ejection fraction, normal echocardiogram and he was discharged with followup today.  He was started on low-dose Lipitor. Overall he reports that he is doing well. He does not check his blood pressure at home. He is active and manages a large garden. He denies any significant chest pain or shortness of breath. Lab work in the hospital showed BNP 142, peak troponin 1.5,   EKG shows normal sinus rhythm with rate 85 beats per minute, nonspecific T wave abnormality in V6, 1, aVL    Outpatient Encounter Prescriptions as of 06/03/2012  Medication Sig Dispense Refill  . acetaminophen (TYLENOL) 325 MG tablet Take 650 mg by mouth as needed.        Marland Kitchen ADVAIR DISKUS 250-50 MCG/DOSE AEPB INHALE 1 PUFF BY MOUTH TWICE A DAY  1 each  3  . aspirin 81 MG EC tablet Take 81 mg by mouth daily. Swallow whole.      Marland Kitchen atorvastatin (LIPITOR) 10 MG tablet Take 1 tablet (10 mg total) by mouth daily.  90 tablet  3  . COMBIVENT 18-103 MCG/ACT inhaler USE AS DIRECTED AS NEEDED  14.7 g  3  . Fluticasone-Salmeterol (ADVAIR DISKUS) 250-50 MCG/DOSE AEPB Inhale 1 puff into the lungs 2 (two) times daily.  60 each  prn  . HYDROcodone-acetaminophen (LORTAB 5) 5-500 MG per tablet Take 1 tablet by mouth as needed.        . metoprolol succinate (TOPROL-XL) 12.5 mg TB24 Take by mouth daily.      . predniSONE (DELTASONE) 10 MG tablet TAKE 4 TABLETS BY MOUTH FOR 2 DAYS THEN 3 TABS FOR 2 DAYS, 2 TABS FOR 2 DAYS, 1 TABLET FOR 2 DAYS  20 tablet  3  . traMADol (ULTRAM) 50 MG tablet Takes 2 tablets 4 times daily as needed for pain.          Review of Systems  Constitutional: Negative.   HENT: Negative.   Eyes: Negative.   Respiratory: Negative.   Cardiovascular: Negative.   Gastrointestinal: Negative.   Musculoskeletal: Negative.   Skin: Negative.   Neurological: Negative.   Hematological: Negative.   Psychiatric/Behavioral: Negative.   All other systems reviewed and are negative.    BP 160/80  Pulse 85  Ht 5\' 6"  (1.676 m)  Wt 183 lb 4 oz (83.122 kg)  BMI 29.58 kg/m2  Physical Exam  Nursing note and vitals reviewed. Constitutional: He is oriented to person, place, and time. He appears well-developed and well-nourished.  HENT:  Head: Normocephalic.  Nose: Nose normal.  Mouth/Throat: Oropharynx is clear and moist.  Eyes: Conjunctivae are normal. Pupils are equal, round, and reactive to light.  Neck: Normal range of motion. Neck supple. No JVD present.  Cardiovascular: Normal rate, regular rhythm, S1 normal, S2 normal, normal heart sounds and intact distal pulses.  Exam reveals no gallop and no friction rub.   No murmur heard. Pulmonary/Chest: Effort normal and breath sounds normal. No respiratory distress. He has no wheezes. He has no rales. He exhibits no tenderness.  Abdominal: Soft. Bowel sounds are normal. He exhibits no distension. There is  no tenderness.  Musculoskeletal: Normal range of motion. He exhibits no edema and no tenderness.  Lymphadenopathy:    He has no cervical adenopathy.  Neurological: He is alert and oriented to person, place, and time. Coordination normal.  Skin: Skin is warm and dry. No rash noted. No erythema.  Psychiatric: He has a normal mood and affect. His behavior is normal. Judgment and thought content normal.           Assessment and Plan

## 2012-06-03 NOTE — Assessment & Plan Note (Signed)
Blood pressure is elevated today. Will improvement on recheck. We've asked him to have his daughter who is a nurse check his blood pressure at home on a regular basis and call our office in the next several weeks. We have given him some parameters with goal systolic pressure less than 140.

## 2012-06-03 NOTE — Patient Instructions (Addendum)
Please monitor your blood pressure at home It is high today Goal blood pressure on the top number is less than 140 Call the office if pressures are high: [phone]  Please call us if you have new issues that need to be addressed before your next appt.  Your physician wants you to follow-up in: 6 months.  You will receive a reminder letter in the mail two months in advance. If you don't receive a letter, please call our office to schedule the follow-up appointment.

## 2012-06-03 NOTE — Assessment & Plan Note (Signed)
Currently on inhalers. Followed by pulmonary.

## 2012-06-07 ENCOUNTER — Telehealth: Payer: Self-pay

## 2012-06-07 ENCOUNTER — Encounter: Payer: Self-pay | Admitting: Internal Medicine

## 2012-06-07 ENCOUNTER — Ambulatory Visit (INDEPENDENT_AMBULATORY_CARE_PROVIDER_SITE_OTHER): Payer: Medicare Other | Admitting: Internal Medicine

## 2012-06-07 VITALS — BP 172/80 | HR 84 | Ht 67.0 in | Wt 182.0 lb

## 2012-06-07 DIAGNOSIS — Z23 Encounter for immunization: Secondary | ICD-10-CM

## 2012-06-07 DIAGNOSIS — J449 Chronic obstructive pulmonary disease, unspecified: Secondary | ICD-10-CM

## 2012-06-07 MED ORDER — PREDNISONE (PAK) 10 MG PO TABS: 10.0000 mg | ORAL_TABLET | Freq: Every day | ORAL | Status: AC

## 2012-06-07 MED ORDER — ALBUTEROL SULFATE HFA 108 (90 BASE) MCG/ACT IN AERS: 2.0000 | INHALATION_SPRAY | Freq: Four times a day (QID) | RESPIRATORY_TRACT | Status: DC | PRN

## 2012-06-07 NOTE — Progress Notes (Signed)
Patient ID: VAUN HYNDMAN, male    DOB: 11-08-1945, 66 y.o.   MRN: [id number]  HPI 06/08/11- 66 year old male former smoker followed for COPD, history of small cell cancer, history of pneumothorax.   PCP is Dr Carylon Perches Last here December 03, 2010- Since then had routine physical w/ Dr Ouida Sills- okay except for the breathing part. He did use stand-by prednisone 3 months ago on trip to mountains and would like to keep some on hand.  He is okay today, asking about pertussis vaccine and wants flu vax. Last tetanus about 3 years ago- discussed. Has had more than one pneumovax. Had shingles vaccine.  Expects wheeze and dyspnea with exertion- unchanged. Spiriva didn't help- made him worse.   12/07/11- 67 year old male former smoker followed for COPD, history of small cell cancer, history of pneumothorax.   PCP is Dr Carylon Perches Did well through the winter until he caught a cold last week. Coughing clear mucus. Denies fever or purulent sputum. Feels he just needs more time to clear this. Used up last standby prescription for prednisone one month ago. Discussed change of delivery device for Combivent. Advair 250 works as well as Advair 500.  06/07/12-57 year-old male former smoker followed for COPD, history of small cell cancer, history of pneumothorax.   PCP is Dr Carylon Perches Had  NSTMI about 3 weeks ago;/  [facility]; Denies any wheezing, cough, congestion, or SOB. He describes the episode as having choked on a vitamin pill. He tried to use his new Combivent Respimat device for his hands were shaking too much so he decided he did not like that type of dispenser. Workup indicated normal ejection fraction and nonocclusive coronary artery disease.  He says his breathing goes "fine" on 10 mg of maintenance prednisone daily, but poorly if she skips it. Little cough or phlegm, no wheeze. COPD assessment test (CAT) core 5/40 CXR 12/07/11 IMPRESSION:  Stable postoperative and chronic changes. No active disease.    Original Report Authenticated By: Cyndie Chime, M.D.   Review of Systems- see HPI Constitutional:   No-   weight loss, night sweats, fevers, chills, fatigue, lassitude. HEENT:   No-  headaches, difficulty swallowing, tooth/dental problems, sore throat,       No-  sneezing, itching, ear ache, nasal congestion, post nasal drip,  CV:  No-   chest pain, orthopnea, PND, swelling in lower extremities, anasarca, dizziness, palpitations Resp:  +Persistent shortness of breath with exertion not at rest.              No-  productive cough, + occasional non-productive cough,  No-  coughing up of blood.              No-   change in color of mucus.  No- wheezing.   Skin: No-   rash or lesions. GI:  No-   heartburn, indigestion, abdominal pain, nausea, vomiting, GU: . MS:  No-   joint pain or swelling.   Neuro- nothing unusual Psych:  No- change in mood or affect. No depression or anxiety.  No memory loss.  Objective:   Physical Exam BP 172/80  Pulse 84  Ht 5\' 7"  (1.702 m)  Wt 182 lb (82.555 kg)  BMI 28.51 kg/m2  SpO2 99%  General- Alert, Oriented, Affect-appropriate, Distress- none acute  Abdominal obesity Skin- rash-none, lesions- none, excoriation- none Lymphadenopathy- none Head- atraumatic            Eyes- Gross vision intact, PERRLA, conjunctivae clear secretions  Ears- Hearing, canals normal            Nose- Clear, No-Septal dev, mucus, polyps, erosion, perforation             Throat- Mallampati II , mucosa clear , drainage- none, tonsils- atrophic Neck- flexible , trachea midline, no stridor , thyroid nl, carotid no bruit Chest - symmetrical excursion , unlabored           Heart/CV- RRR , no murmur , no gallop  , no rub, nl s1 s2                           - JVD- none , edema- none, stasis changes- none, varices- none           Lung- +faint expiratory wheeze, very distant, LUL, cough- light , dullness-none, rub- none           Chest wall-  Abd-  Br/ Gen/ Rectal- Not  done, not indicated Extrem- cyanosis- none, clubbing, none, atrophy- none, strength- nl Neuro- grossly intact to observation

## 2012-06-07 NOTE — Patient Instructions (Addendum)
Sample albuterol HFA rescue inhaler     2 puffs, up to 4 times daily as needed. Try this instead of Combivent.    Script for rescue inhaler  Try reducing your total prednisone intake by alternating 1 tab(10 mg) with 1/2 tab(5 mg) every other day Script for prednisone  Flu v ax

## 2012-06-07 NOTE — Telephone Encounter (Signed)
Would start amlodipine 5 mg daily with close check of the blood pressure, increased to 10 mg in one week if not at goal. Would have him continue close blood pressure checks and call us with results weekly.

## 2012-06-07 NOTE — Telephone Encounter (Signed)
Dr. Roxy Cedar office called to say pt is in their office now BP=172/80 Wants to know if we want to make any changes in meds We will call pt back at home

## 2012-06-08 ENCOUNTER — Ambulatory Visit: Payer: Medicare Other | Admitting: Internal Medicine

## 2012-06-08 ENCOUNTER — Other Ambulatory Visit: Payer: Self-pay

## 2012-06-08 MED ORDER — AMLODIPINE BESYLATE 5 MG PO TABS: 5.0000 mg | ORAL_TABLET | Freq: Every day | ORAL | Status: DC

## 2012-06-08 NOTE — Telephone Encounter (Signed)
Pt informed Understanding verb New RX sent to pharmacy 

## 2012-06-08 NOTE — Telephone Encounter (Signed)
LMTCB

## 2012-06-10 ENCOUNTER — Telehealth: Payer: Self-pay | Admitting: Internal Medicine

## 2012-06-10 MED ORDER — ALBUTEROL SULFATE HFA 108 (90 BASE) MCG/ACT IN AERS: 2.0000 | INHALATION_SPRAY | Freq: Four times a day (QID) | RESPIRATORY_TRACT | Status: DC | PRN

## 2012-06-10 NOTE — Telephone Encounter (Signed)
Rx changed from ventolin to proair and called change into [facility].  Med list updated.  Pt aware.

## 2012-06-14 DIAGNOSIS — I10 Essential (primary) hypertension: Secondary | ICD-10-CM | POA: Diagnosis not present

## 2012-06-14 DIAGNOSIS — I2510 Atherosclerotic heart disease of native coronary artery without angina pectoris: Secondary | ICD-10-CM | POA: Diagnosis not present

## 2012-06-14 DIAGNOSIS — E785 Hyperlipidemia, unspecified: Secondary | ICD-10-CM | POA: Diagnosis not present

## 2012-06-14 DIAGNOSIS — J449 Chronic obstructive pulmonary disease, unspecified: Secondary | ICD-10-CM | POA: Diagnosis not present

## 2012-06-14 DIAGNOSIS — Z23 Encounter for immunization: Secondary | ICD-10-CM | POA: Diagnosis not present

## 2012-06-14 DIAGNOSIS — Z1212 Encounter for screening for malignant neoplasm of rectum: Secondary | ICD-10-CM | POA: Diagnosis not present

## 2012-06-15 NOTE — Assessment & Plan Note (Signed)
Low-dose maintenance prednisone has stabilized his breathing considerably. We have discussed trying to reduce the dose by alternating 5 with 10 mg every other day. Plan-flu shot. Replace Combivent with albuterol metered inhaler so he'll be more comfortable with his device or rescue as needed.

## 2012-06-16 ENCOUNTER — Telehealth: Payer: Self-pay

## 2012-06-16 NOTE — Telephone Encounter (Signed)
Patient returned your call, told him you would call him back when you return to office.

## 2012-06-16 NOTE — Telephone Encounter (Signed)
FYI

## 2012-06-16 NOTE — Telephone Encounter (Signed)
Pt reports systolic BP being normal (119 mm hg) since starting amlodipine. He also says he went to [facility] the other day and BP was normal. I advised him to remain on this medication and continue to monitor BP. Understanding verb.

## 2012-06-16 NOTE — Telephone Encounter (Signed)
Message copied by [facility], Zamoria Boss E on Thu Jun 16, 2012 10:00 AM ------      Message from: Khamarion Bjelland E      Created: Wed Jun 08, 2012  9:15 AM      Regarding: BP       BP after starting amlodipine

## 2012-06-16 NOTE — Telephone Encounter (Signed)
LMTCB re: BP/amlodipine start

## 2012-07-02 ENCOUNTER — Other Ambulatory Visit: Payer: Self-pay | Admitting: Internal Medicine

## 2012-08-02 ENCOUNTER — Other Ambulatory Visit: Payer: Self-pay | Admitting: Internal Medicine

## 2012-11-16 ENCOUNTER — Other Ambulatory Visit: Payer: Self-pay | Admitting: Internal Medicine

## 2012-11-16 NOTE — Telephone Encounter (Signed)
Ok to refill and add back to his med list

## 2012-11-16 NOTE — Telephone Encounter (Signed)
Please advise if okay to refill as this is not his his medication list. Thanks.

## 2012-11-17 NOTE — Telephone Encounter (Signed)
Rx has been sent in per CY. 

## 2012-11-24 ENCOUNTER — Other Ambulatory Visit: Payer: Self-pay | Admitting: Internal Medicine

## 2012-11-25 ENCOUNTER — Telehealth: Payer: Self-pay | Admitting: Internal Medicine

## 2012-11-25 NOTE — Telephone Encounter (Signed)
[facility] Was advised they have RX on hold for pt form when we sent it in on 11/17/12. I advised them pt calling and needed it today. She will get this filled for pt. Called and made pt aware of this. Nothing further was needed

## 2012-12-06 ENCOUNTER — Ambulatory Visit: Payer: Medicare Other | Admitting: Cardiovascular Disease

## 2012-12-06 ENCOUNTER — Ambulatory Visit (INDEPENDENT_AMBULATORY_CARE_PROVIDER_SITE_OTHER): Payer: Medicare Other | Admitting: Internal Medicine

## 2012-12-06 ENCOUNTER — Encounter: Payer: Self-pay | Admitting: Internal Medicine

## 2012-12-06 ENCOUNTER — Ambulatory Visit (INDEPENDENT_AMBULATORY_CARE_PROVIDER_SITE_OTHER)
Admission: RE | Admit: 2012-12-06 | Discharge: 2012-12-06 | Disposition: A | Discharge status: Home / Self Care | Payer: Medicare Other | Source: Ambulatory Visit | Attending: Internal Medicine | Admitting: Internal Medicine

## 2012-12-06 VITALS — BP 138/70 | HR 83 | Ht 67.0 in | Wt 189.2 lb

## 2012-12-06 DIAGNOSIS — IMO0002 Reserved for concepts with insufficient information to code with codable children: Secondary | ICD-10-CM

## 2012-12-06 DIAGNOSIS — C3490 Malignant neoplasm of unspecified part of unspecified bronchus or lung: Secondary | ICD-10-CM

## 2012-12-06 DIAGNOSIS — J4489 Other specified chronic obstructive pulmonary disease: Secondary | ICD-10-CM

## 2012-12-06 DIAGNOSIS — Z9889 Other specified postprocedural states: Secondary | ICD-10-CM | POA: Diagnosis not present

## 2012-12-06 MED ORDER — ALBUTEROL SULFATE HFA 108 (90 BASE) MCG/ACT IN AERS: 2.0000 | INHALATION_SPRAY | Freq: Four times a day (QID) | RESPIRATORY_TRACT | Status: DC | PRN

## 2012-12-06 MED ORDER — MOMETASONE FURO-FORMOTEROL FUM 100-5 MCG/ACT IN AERO: INHALATION_SPRAY | RESPIRATORY_TRACT | Status: DC

## 2012-12-06 MED ORDER — MOMETASONE FURO-FORMOTEROL FUM 100-5 MCG/ACT IN AERO: 2.0000 | INHALATION_SPRAY | Freq: Two times a day (BID) | RESPIRATORY_TRACT | Status: DC

## 2012-12-06 MED ORDER — PREDNISONE (PAK) 5 MG PO TABS: ORAL_TABLET | ORAL | Status: DC

## 2012-12-06 NOTE — Patient Instructions (Addendum)
Change Advair to sample and script (sent) for Dulera 100      2 puffs then rinse mouth, twice daily  Change prednisone to 5 mg tabs (script sent) so you can alternate 10 mg with 5 mg every other day.  Please call as needed  Now that the weather is getting better, try to walk as much as you can. This will build stamina and help you get your weight under control.  Order Bone density test  For dx chronic steroid therapy  Order- CXR  Dx COPD  Script refill Proair

## 2012-12-06 NOTE — Progress Notes (Signed)
Patient ID: [name], male    DOB: April 06, 1946, 67 y.o.   MRN: [id number]  HPI 06/08/11- 67 year old male former smoker followed for COPD, history of small cell cancer, history of pneumothorax.   PCP is Dr Carylon Perches Last here December 03, 2010- Since then had routine physical w/ Dr Ouida Sills- okay except for the breathing part. He did use stand-by prednisone 3 months ago on trip to mountains and would like to keep some on hand.  He is okay today, asking about pertussis vaccine and wants flu vax. Last tetanus about 3 years ago- discussed. Has had more than one pneumovax. Had shingles vaccine.  Expects wheeze and dyspnea with exertion- unchanged. Spiriva didn't help- made him worse.   12/07/11- 67 year old male former smoker followed for COPD, history of small cell cancer, history of pneumothorax.   PCP is Dr Carylon Perches Did well through the winter until he caught a cold last week. Coughing clear mucus. Denies fever or purulent sputum. Feels he just needs more time to clear this. Used up last standby prescription for prednisone one month ago. Discussed change of delivery device for Combivent. Advair 250 works as well as Advair 500.  06/07/12-67 year-old male former smoker followed for COPD, history of small cell cancer, history of pneumothorax.   PCP is Dr Carylon Perches Had  NSTMI about 3 weeks ago;/  [facility]; Denies any wheezing, cough, congestion, or SOB. He describes the episode as having choked on a vitamin pill. He tried to use his new Combivent Respimat device but his hands were shaking too much so he decided he did not like that type of dispenser. Workup indicated normal ejection fraction and nonocclusive coronary artery disease.  He says his breathing goes "fine" on 10 mg of maintenance prednisone daily, but poorly if he skips it. Little cough or phlegm, no wheeze. COPD assessment test (CAT) core 5/40 CXR 12/07/11 IMPRESSION:  Stable postoperative and chronic changes. No active disease.   Original Report Authenticated By: Cyndie Chime, M.D.   12/06/12- 67 year-old male former smoker followed for COPD, history of small cell cancer, history of pneumothorax, complicated by CAD/ MI.  FOLLOWS FOR: SOB with activity, wheezing at times as well with chest congestion. Noticing weight gain. We discussed chronic steroids and exercise. No cough, chest pain or acute issues.  Has not had bone density check.  Review of Systems- see HPI Constitutional:   No-   weight loss, night sweats, fevers, chills, fatigue, lassitude. HEENT:   No-  headaches, difficulty swallowing, tooth/dental problems, sore throat,       No-  sneezing, itching, ear ache, nasal congestion, post nasal drip,  CV:  No-   chest pain, orthopnea, PND, swelling in lower extremities, anasarca, dizziness, palpitations Resp:  +Persistent shortness of breath with exertion not at rest.              No-  productive cough, + occasional non-productive cough,  No-  coughing up of blood.              No-   change in color of mucus.  No- wheezing.   Skin: No-   rash or lesions. GI:  No-   heartburn, indigestion, abdominal pain, nausea, vomiting, GU: . MS:  No-   joint pain or swelling.   Neuro- nothing unusual Psych:  No- change in mood or affect. No depression or anxiety.  No memory loss.  Objective:   Physical Exam BP 138/70  Pulse 83  Ht  5\' 7"  (1.702 m)  Wt 189 lb 3.2 oz (85.821 kg)  BMI 29.63 kg/m2  SpO2 98% General- Alert, Oriented, Affect-appropriate, Distress- none acute.  Abdominal obesity Skin- rash-none, lesions- none, excoriation- none Lymphadenopathy- none Head- atraumatic            Eyes- Gross vision intact, PERRLA, conjunctivae clear secretions            Ears- Hearing, canals normal            Nose- Clear, No-Septal dev, mucus, polyps, erosion, perforation             Throat- Mallampati II , mucosa clear , drainage- none, tonsils- atrophic Neck- flexible , trachea midline, no stridor , thyroid nl, carotid  no bruit Chest - symmetrical excursion , unlabored           Heart/CV- RRR , no murmur , no gallop  , no rub, nl s1 s2                           - JVD- none , edema- none, stasis changes- none, varices- none           Lung- No- wheeze, very distant, LUL, cough- light , dullness-none, rub- none. Mild puffing breathlessness with long sentences.            Chest wall-  Abd-  Br/ Gen/ Rectal- Not done, not indicated Extrem- cyanosis- none, clubbing, none, atrophy- none, strength- nl Neuro- grossly intact to observation

## 2012-12-06 NOTE — Assessment & Plan Note (Signed)
Steroid dependent, but getting weight gain. We discussed steroids and will try to reduce the dose. Plan- reduce maintenance steroid to 10 alt w/ 5 mg every other day from 10 mg daily.  Check bone density.           Because of cost increase for Advair- will change to [facility] as discussed

## 2012-12-06 NOTE — Assessment & Plan Note (Signed)
Still no evidence of recurrence.  Plan CXR

## 2012-12-14 ENCOUNTER — Ambulatory Visit: Payer: Medicare Other | Admitting: Cardiovascular Disease

## 2012-12-14 NOTE — Progress Notes (Signed)
Quick Note:  Pt aware of results. ______ 

## 2012-12-15 ENCOUNTER — Ambulatory Visit: Payer: Medicare Other | Admitting: Cardiovascular Disease

## 2012-12-20 ENCOUNTER — Ambulatory Visit (INDEPENDENT_AMBULATORY_CARE_PROVIDER_SITE_OTHER)
Admission: RE | Admit: 2012-12-20 | Discharge: 2012-12-20 | Disposition: A | Discharge status: Home / Self Care | Payer: Medicare Other | Source: Ambulatory Visit | Attending: Internal Medicine | Admitting: Internal Medicine

## 2012-12-20 DIAGNOSIS — IMO0002 Reserved for concepts with insufficient information to code with codable children: Secondary | ICD-10-CM

## 2012-12-26 ENCOUNTER — Encounter: Payer: Self-pay | Admitting: Cardiovascular Disease

## 2012-12-26 ENCOUNTER — Ambulatory Visit (INDEPENDENT_AMBULATORY_CARE_PROVIDER_SITE_OTHER): Payer: Medicare Other | Admitting: Cardiovascular Disease

## 2012-12-26 VITALS — BP 140/80 | HR 89 | Ht 67.0 in | Wt 185.5 lb

## 2012-12-26 DIAGNOSIS — J449 Chronic obstructive pulmonary disease, unspecified: Secondary | ICD-10-CM

## 2012-12-26 DIAGNOSIS — I10 Essential (primary) hypertension: Secondary | ICD-10-CM | POA: Diagnosis not present

## 2012-12-26 DIAGNOSIS — I214 Non-ST elevation (NSTEMI) myocardial infarction: Secondary | ICD-10-CM

## 2012-12-26 DIAGNOSIS — E785 Hyperlipidemia, unspecified: Secondary | ICD-10-CM | POA: Insufficient documentation

## 2012-12-26 NOTE — Assessment & Plan Note (Signed)
Blood pressure is well controlled on today's visit. No changes made to the medications. 

## 2012-12-26 NOTE — Assessment & Plan Note (Signed)
Cholesterol is at goal on the current lipid regimen. No changes to the medications were made.  

## 2012-12-26 NOTE — Patient Instructions (Addendum)
You are doing well. No medication changes were made.  Please call us if you have new issues that need to be addressed before your next appt.  Your physician wants you to follow-up in: 12 months.  You will receive a reminder letter in the mail two months in advance. If you don't receive a letter, please call our office to schedule the follow-up appointment. 

## 2012-12-26 NOTE — Assessment & Plan Note (Signed)
Currently on chronic steroids

## 2012-12-26 NOTE — Assessment & Plan Note (Signed)
Currently with no symptoms of angina. No further workup at this time. Continue current medication regimen. 

## 2012-12-26 NOTE — Progress Notes (Signed)
Patient ID: [name], male    DOB: April 02, 1946, 67 y.o.   MRN: [id number]  HPI Comments: Mr. Enberg is a pleasant 67 year old gentleman with history of lung cancer, COPD who had a non-ST elevation MI after choking on a vitamin pill, admission to [facility] 05/09/2012 with peak troponin 1.5. He had a cardiac catheterization that showed nonocclusive coronary artery disease, several regions of 20% stenosis otherwise normal ejection fraction, normal echocardiogram. He presents for routine followup  Lab work in the hospital showed BNP 142, peak troponin 1.5,   He was previously started on low-dose Lipitor. Overall he reports that he is doing well.  He does not check his blood pressure at home. He is active and manages a large garden. He denies any significant chest pain or shortness of breath.he is currently on chronic steroids for lung disease. Prior lung cancer 15 years ago with treatment at that time  Total cholesterol 158, LDL 65, HDL 54  EKG shows normal sinus rhythm with rate 89 beats per minute, no significant ST or T wave changes    Outpatient Encounter Prescriptions as of 12/26/2012  Medication Sig Dispense Refill  . acetaminophen (TYLENOL) 325 MG tablet Take 650 mg by mouth as needed.        Marland Kitchen albuterol (PROAIR HFA) 108 (90 BASE) MCG/ACT inhaler Inhale 2 puffs into the lungs every 6 (six) hours as needed for wheezing or shortness of breath.  1 Inhaler  prn  . amLODipine (NORVASC) 5 MG tablet Take 1 tablet (5 mg total) by mouth daily.  180 tablet  3  . aspirin 81 MG EC tablet Take 81 mg by mouth daily. Swallow whole.      Marland Kitchen atorvastatin (LIPITOR) 10 MG tablet Take 1 tablet (10 mg total) by mouth daily.  90 tablet  3  . mometasone-formoterol (DULERA) 100-5 MCG/ACT AERO 2 puffs, then rinse mouth, twice daily  1 Inhaler  prn  . mometasone-formoterol (DULERA) 100-5 MCG/ACT AERO Inhale 2 puffs into the lungs 2 (two) times daily. RINSE MOUTH AFTER USE  1 Inhaler  0  . predniSONE (STERAPRED UNI-PAK)  5 MG TABS Alternate 2 (10 mg) with 1 tab (5 mg) every other day  50 tablet  3  . traMADol (ULTRAM) 50 MG tablet Takes 2 tablets 4 times daily as needed for pain.       Review of Systems  Constitutional: Negative.   HENT: Negative.   Eyes: Negative.   Respiratory: Negative.   Cardiovascular: Negative.   Gastrointestinal: Negative.   Musculoskeletal: Negative.   Skin: Negative.   Neurological: Negative.   Psychiatric/Behavioral: Negative.   All other systems reviewed and are negative.    BP 140/80  Pulse 89  Ht 5\' 7"  (1.702 m)  Wt 185 lb 8 oz (84.142 kg)  BMI 29.05 kg/m2  Physical Exam  Nursing note and vitals reviewed. Constitutional: He is oriented to person, place, and time. He appears well-developed and well-nourished.  HENT:  Head: Normocephalic.  Nose: Nose normal.  Mouth/Throat: Oropharynx is clear and moist.  Eyes: Conjunctivae are normal. Pupils are equal, round, and reactive to light.  Neck: Normal range of motion. Neck supple. No JVD present.  Cardiovascular: Normal rate, regular rhythm, S1 normal, S2 normal, normal heart sounds and intact distal pulses.  Exam reveals no gallop and no friction rub.   No murmur heard. Pulmonary/Chest: Effort normal and breath sounds normal. No respiratory distress. He has no wheezes. He has no rales. He exhibits no tenderness.  Abdominal: Soft. Bowel sounds are normal. He exhibits no distension. There is no tenderness.  Musculoskeletal: Normal range of motion. He exhibits no edema and no tenderness.  Lymphadenopathy:    He has no cervical adenopathy.  Neurological: He is alert and oriented to person, place, and time. Coordination normal.  Skin: Skin is warm and dry. No rash noted. No erythema.  Psychiatric: He has a normal mood and affect. His behavior is normal. Judgment and thought content normal.      Assessment and Plan

## 2013-01-23 ENCOUNTER — Other Ambulatory Visit: Payer: Self-pay | Admitting: Internal Medicine

## 2013-03-27 ENCOUNTER — Other Ambulatory Visit: Payer: Self-pay | Admitting: Internal Medicine

## 2013-04-07 ENCOUNTER — Encounter: Payer: Self-pay | Admitting: Internal Medicine

## 2013-04-07 ENCOUNTER — Ambulatory Visit (INDEPENDENT_AMBULATORY_CARE_PROVIDER_SITE_OTHER): Payer: Medicare Other | Admitting: Internal Medicine

## 2013-04-07 VITALS — BP 142/76 | HR 89 | Ht 67.0 in | Wt 184.4 lb

## 2013-04-07 DIAGNOSIS — C3490 Malignant neoplasm of unspecified part of unspecified bronchus or lung: Secondary | ICD-10-CM | POA: Diagnosis not present

## 2013-04-07 DIAGNOSIS — J449 Chronic obstructive pulmonary disease, unspecified: Secondary | ICD-10-CM

## 2013-04-07 DIAGNOSIS — J4489 Other specified chronic obstructive pulmonary disease: Secondary | ICD-10-CM

## 2013-04-07 MED ORDER — FLUTICASONE-SALMETEROL 250-50 MCG/DOSE IN AEPB: INHALATION_SPRAY | RESPIRATORY_TRACT | Status: DC

## 2013-04-07 MED ORDER — PREDNISONE 5 MG PO TABS: ORAL_TABLET | ORAL | Status: DC

## 2013-04-07 NOTE — Progress Notes (Signed)
Patient ID: [name], male    DOB: 1945/10/24, 67 y.o.   MRN: [id number]  HPI 06/08/11- 67 year old male former smoker followed for COPD, history of small cell cancer, history of pneumothorax.   PCP is Dr Carylon Perches Last here December 03, 2010- Since then had routine physical w/ Dr Ouida Sills- okay except for the breathing part. He did use stand-by prednisone 3 months ago on trip to mountains and would like to keep some on hand.  He is okay today, asking about pertussis vaccine and wants flu vax. Last tetanus about 3 years ago- discussed. Has had more than one pneumovax. Had shingles vaccine.  Expects wheeze and dyspnea with exertion- unchanged. Spiriva didn't help- made him worse.   12/07/11- 67 year old male former smoker followed for COPD, history of small cell cancer, history of pneumothorax.   PCP is Dr Carylon Perches Did well through the winter until he caught a cold last week. Coughing clear mucus. Denies fever or purulent sputum. Feels he just needs more time to clear this. Used up last standby prescription for prednisone one month ago. Discussed change of delivery device for Combivent. Advair 250 works as well as Advair 500.  06/07/12-28 year-old male former smoker followed for COPD, history of small cell cancer, history of pneumothorax.   PCP is Dr Carylon Perches Had  NSTMI about 3 weeks ago;/  [facility]; Denies any wheezing, cough, congestion, or SOB. He describes the episode as having choked on a vitamin pill. He tried to use his new Combivent Respimat device but his hands were shaking too much so he decided he did not like that type of dispenser. Workup indicated normal ejection fraction and nonocclusive coronary artery disease.  He says his breathing goes "fine" on 10 mg of maintenance prednisone daily, but poorly if he skips it. Little cough or phlegm, no wheeze. COPD assessment test (CAT) core 5/40 CXR 12/07/11 IMPRESSION:  Stable postoperative and chronic changes. No active disease.   Original Report Authenticated By: Cyndie Chime, M.D.   12/06/12- 68 year-old male former smoker followed for COPD, history of small cell cancer, history of pneumothorax, complicated by CAD/ MI.  FOLLOWS FOR: SOB with activity, wheezing at times as well with chest congestion. Noticing weight gain. We discussed chronic steroids and exercise. No cough, chest pain or acute issues.  Has not had bone density check.  04/07/13- 56 year-old male former smoker followed for COPD, history of small cell cancer, history of pneumothorax, complicated by CAD/ MI.  FOLLOWS FOR: reports breathing has been doing well since last ov.  feels that the [facility] does not help breathing as much as as the Advair and is much more expensive.  no new complaints. Incidental tick bites. Still alternating prednisone 5 with 10 mg every other day. Had bone density test in March. CXR 12/14/12 IMPRESSION: Underlying emphysema. Postoperative change in the  left. Bibasilar scarring. No edema or consolidation. Essentially  stable chest appearance compared to prior study.  Original Report Authenticated By: Bretta Bang, M.D.  Review of Systems- see HPI Constitutional:   No-   weight loss, night sweats, fevers, chills, fatigue, lassitude. HEENT:   No-  headaches, difficulty swallowing, tooth/dental problems, sore throat,       No-  sneezing, itching, ear ache, nasal congestion, post nasal drip,  CV:  No-   chest pain, orthopnea, PND, swelling in lower extremities, anasarca, dizziness, palpitations Resp:  +Persistent shortness of breath with exertion not at rest.  No-  productive cough, + occasional non-productive cough,  No-  coughing up of blood.              No-   change in color of mucus.  + wheezing.   Skin: No-   rash or lesions. GI:  No-   heartburn, indigestion, abdominal pain, nausea, vomiting, GU: . MS:  No-   joint pain or swelling.   Neuro- nothing unusual Psych:  No- change in mood or affect. No  depression or anxiety.  No memory loss.  Objective:   Physical Exam  General- Alert, Oriented, Affect-appropriate, Distress- none acute.  Abdominal obesity Skin- rash-none, lesions- none, excoriation- none Lymphadenopathy- none Head- atraumatic            Eyes- Gross vision intact, PERRLA, conjunctivae clear secretions            Ears- Hearing, canals normal            Nose- Clear, No-Septal dev, mucus, polyps, erosion, perforation             Throat- Mallampati II , mucosa clear , drainage- none, tonsils- atrophic Neck- flexible , trachea midline, no stridor , thyroid nl, carotid no bruit Chest - symmetrical excursion , unlabored           Heart/CV- RRR , no murmur , no gallop  , no rub, nl s1 s2                           - JVD- none , edema- none, stasis changes- none, varices- none           Lung- +wheeze course and unlabored, very distant, LUL, cough- light , dullness-none, rub- none.             Chest wall-  Abd-  Br/ Gen/ Rectal- Not done, not indicated Extrem- cyanosis- none, clubbing, none, atrophy- none, strength- nl Neuro- grossly intact to observation

## 2013-04-07 NOTE — Patient Instructions (Addendum)
Script sent for prednisone. See if you can reduce to 1 tab daily. If not, ok to stay alternating one with two tabs every other day  Script sent to switch from [facility] back to Advair 250  Take a copy of your bone density test result back to your primary doctor for his information.  Please call as needed

## 2013-04-24 NOTE — Assessment & Plan Note (Signed)
No recurrence so far-long-term remission

## 2013-04-25 ENCOUNTER — Other Ambulatory Visit: Payer: Self-pay | Admitting: Internal Medicine

## 2013-05-25 ENCOUNTER — Other Ambulatory Visit: Payer: Self-pay | Admitting: Internal Medicine

## 2013-06-06 ENCOUNTER — Encounter: Payer: Self-pay | Admitting: Internal Medicine

## 2013-06-06 ENCOUNTER — Telehealth: Payer: Self-pay | Admitting: Internal Medicine

## 2013-06-06 NOTE — Telephone Encounter (Signed)
Error

## 2013-06-14 DIAGNOSIS — Z79899 Other long term (current) drug therapy: Secondary | ICD-10-CM | POA: Diagnosis not present

## 2013-06-14 DIAGNOSIS — E785 Hyperlipidemia, unspecified: Secondary | ICD-10-CM | POA: Diagnosis not present

## 2013-06-14 DIAGNOSIS — C3410 Malignant neoplasm of upper lobe, unspecified bronchus or lung: Secondary | ICD-10-CM | POA: Diagnosis not present

## 2013-06-14 DIAGNOSIS — J4489 Other specified chronic obstructive pulmonary disease: Secondary | ICD-10-CM | POA: Diagnosis not present

## 2013-06-14 DIAGNOSIS — J449 Chronic obstructive pulmonary disease, unspecified: Secondary | ICD-10-CM | POA: Diagnosis not present

## 2013-06-14 DIAGNOSIS — Z125 Encounter for screening for malignant neoplasm of prostate: Secondary | ICD-10-CM | POA: Diagnosis not present

## 2013-06-22 ENCOUNTER — Other Ambulatory Visit: Payer: Self-pay | Admitting: Cardiovascular Disease

## 2013-06-22 DIAGNOSIS — C3490 Malignant neoplasm of unspecified part of unspecified bronchus or lung: Secondary | ICD-10-CM | POA: Diagnosis not present

## 2013-06-22 DIAGNOSIS — I10 Essential (primary) hypertension: Secondary | ICD-10-CM | POA: Diagnosis not present

## 2013-06-22 DIAGNOSIS — E785 Hyperlipidemia, unspecified: Secondary | ICD-10-CM | POA: Diagnosis not present

## 2013-06-22 DIAGNOSIS — Z1212 Encounter for screening for malignant neoplasm of rectum: Secondary | ICD-10-CM | POA: Diagnosis not present

## 2013-06-22 DIAGNOSIS — J449 Chronic obstructive pulmonary disease, unspecified: Secondary | ICD-10-CM | POA: Diagnosis not present

## 2013-06-22 DIAGNOSIS — Z23 Encounter for immunization: Secondary | ICD-10-CM | POA: Diagnosis not present

## 2013-07-03 ENCOUNTER — Other Ambulatory Visit: Payer: Self-pay | Admitting: Internal Medicine

## 2013-07-23 ENCOUNTER — Other Ambulatory Visit: Payer: Self-pay | Admitting: Cardiovascular Disease

## 2013-07-24 ENCOUNTER — Other Ambulatory Visit: Payer: Self-pay | Admitting: *Deleted

## 2013-07-24 MED ORDER — ATORVASTATIN CALCIUM 10 MG PO TABS: 10.0000 mg | ORAL_TABLET | Freq: Every day | ORAL | Status: DC

## 2013-07-24 NOTE — Telephone Encounter (Signed)
Requested Prescriptions   Signed Prescriptions Disp Refills  . atorvastatin (LIPITOR) 10 MG tablet 90 tablet 3    Sig: Take 1 tablet (10 mg total) by mouth daily.    Authorizing Provider: GOLLAN, TIMOTHY J    Ordering User: LOPEZ, MARINA C    

## 2013-07-31 ENCOUNTER — Other Ambulatory Visit: Payer: Self-pay | Admitting: Cardiovascular Disease

## 2013-07-31 ENCOUNTER — Other Ambulatory Visit: Payer: Self-pay | Admitting: *Deleted

## 2013-07-31 ENCOUNTER — Other Ambulatory Visit: Payer: Self-pay | Admitting: Internal Medicine

## 2013-07-31 MED ORDER — AMLODIPINE BESYLATE 5 MG PO TABS: ORAL_TABLET | ORAL | Status: DC

## 2013-07-31 NOTE — Telephone Encounter (Signed)
Requested Prescriptions   Signed Prescriptions Disp Refills  . amLODipine (NORVASC) 5 MG tablet 90 tablet 3    Sig: TAKE 1 TABLET BY MOUTH ONCE A DAY    Authorizing Provider: Antonieta Iba    Ordering User: Kendrick Fries

## 2013-08-03 ENCOUNTER — Other Ambulatory Visit: Payer: Self-pay | Admitting: Internal Medicine

## 2013-08-09 ENCOUNTER — Emergency Department: Payer: Self-pay | Admitting: Emergency Medicine

## 2013-08-09 DIAGNOSIS — J45909 Unspecified asthma, uncomplicated: Secondary | ICD-10-CM | POA: Diagnosis not present

## 2013-08-09 DIAGNOSIS — I10 Essential (primary) hypertension: Secondary | ICD-10-CM | POA: Diagnosis not present

## 2013-08-09 DIAGNOSIS — Z79899 Other long term (current) drug therapy: Secondary | ICD-10-CM | POA: Diagnosis not present

## 2013-08-09 DIAGNOSIS — S81009A Unspecified open wound, unspecified knee, initial encounter: Secondary | ICD-10-CM | POA: Diagnosis not present

## 2013-08-09 DIAGNOSIS — Z87891 Personal history of nicotine dependence: Secondary | ICD-10-CM | POA: Diagnosis not present

## 2013-08-17 ENCOUNTER — Emergency Department: Payer: Self-pay | Admitting: Emergency Medicine

## 2013-08-28 ENCOUNTER — Other Ambulatory Visit: Payer: Self-pay | Admitting: Internal Medicine

## 2013-09-05 DIAGNOSIS — I10 Essential (primary) hypertension: Secondary | ICD-10-CM | POA: Diagnosis not present

## 2013-09-05 DIAGNOSIS — B354 Tinea corporis: Secondary | ICD-10-CM | POA: Diagnosis not present

## 2013-09-24 ENCOUNTER — Other Ambulatory Visit: Payer: Self-pay | Admitting: Internal Medicine

## 2013-10-09 DIAGNOSIS — I10 Essential (primary) hypertension: Secondary | ICD-10-CM | POA: Diagnosis not present

## 2013-10-13 ENCOUNTER — Ambulatory Visit (INDEPENDENT_AMBULATORY_CARE_PROVIDER_SITE_OTHER)
Admission: RE | Admit: 2013-10-13 | Discharge: 2013-10-13 | Disposition: A | Discharge status: Home / Self Care | Payer: Medicare Other | Source: Ambulatory Visit | Attending: Internal Medicine | Admitting: Internal Medicine

## 2013-10-13 ENCOUNTER — Encounter: Payer: Self-pay | Admitting: Internal Medicine

## 2013-10-13 ENCOUNTER — Ambulatory Visit (INDEPENDENT_AMBULATORY_CARE_PROVIDER_SITE_OTHER): Payer: Medicare Other | Admitting: Internal Medicine

## 2013-10-13 VITALS — BP 118/80 | HR 85 | Ht 67.0 in | Wt 187.2 lb

## 2013-10-13 DIAGNOSIS — J449 Chronic obstructive pulmonary disease, unspecified: Secondary | ICD-10-CM

## 2013-10-13 DIAGNOSIS — Z23 Encounter for immunization: Secondary | ICD-10-CM | POA: Diagnosis not present

## 2013-10-13 DIAGNOSIS — C3490 Malignant neoplasm of unspecified part of unspecified bronchus or lung: Secondary | ICD-10-CM

## 2013-10-13 DIAGNOSIS — J438 Other emphysema: Secondary | ICD-10-CM | POA: Diagnosis not present

## 2013-10-13 MED ORDER — FLUTICASONE-SALMETEROL 250-50 MCG/DOSE IN AEPB: INHALATION_SPRAY | RESPIRATORY_TRACT | Status: DC

## 2013-10-13 MED ORDER — PREDNISONE 5 MG PO TABS: ORAL_TABLET | ORAL | Status: DC

## 2013-10-13 MED ORDER — ALBUTEROL SULFATE HFA 108 (90 BASE) MCG/ACT IN AERS: 1.0000 | INHALATION_SPRAY | RESPIRATORY_TRACT | Status: DC | PRN

## 2013-10-13 NOTE — Patient Instructions (Addendum)
Scripts for prednisone, Advair, Tour manager- CXR             hx COPD, lung cancer  Prevnar pneumonia conjugate 13 vaccine  Please call as needed

## 2013-10-13 NOTE — Progress Notes (Signed)
Patient ID: [name], male    DOB: [birth year], 68 y.o.   MRN: [id number]  HPI 06/08/11- 68 year old male former smoker followed for COPD, history of small cell cancer, history of pneumothorax.   PCP is Dr Asencion Noble Last here December 03, 2010- Since then had routine physical w/ Dr Willey Blade- okay except for the breathing part. He did use stand-by prednisone 3 months ago on trip to [place] and would like to keep some on hand.  He is okay today, asking about pertussis vaccine and wants flu vax. Last tetanus about 3 years ago- discussed. Has had more than one pneumovax. Had shingles vaccine.  Expects wheeze and dyspnea with exertion- unchanged. Spiriva didn't help- made him worse.   12/07/11-06/08/11- 68 year old male former smoker followed for COPD, history of small cell cancer, history of pneumothorax.   PCP is Dr Asencion Noble Did well through the winter until he caught a cold last week. Coughing clear mucus. Denies fever or purulent sputum. Feels he just needs more time to clear this. Used up last standby prescription for prednisone one month ago. Discussed change of delivery device for Combivent. Advair 250 works as well as Advair 500.  10/13/13- 68 year old male former smoker followed for COPD, history of small cell cancer, history of pneumothorax.  FOLLOWS URK:[id number] about the same since last visit; continues to use Advair with success. Prednisone alternating 10 mg with 5 mg every other day. CXR 12/14/12 IMPRESSION: Underlying emphysema. Postoperative change in the  left. Bibasilar scarring. No edema or consolidation. Essentially  stable chest appearance compared to prior study.  Original Report Authenticated By: Lowella Grip, M.D.  Review of Systems- see HPI Constitutional:   No-   weight loss, night sweats, fevers, chills, fatigue, lassitude. HEENT:   No-  headaches, difficulty swallowing, tooth/dental problems, sore throat,       No-  sneezing, itching, ear ache, nasal congestion, post  nasal drip,  CV:  No-   chest pain, orthopnea, PND, swelling in lower extremities, anasarca, dizziness, palpitations Resp:  +Persistent shortness of breath with exertion not at rest.              +  productive cough,  No non-productive cough,  No-  coughing up of blood.              No-   change in color of mucus.  No- wheezing.   Skin: No-   rash or lesions. GI:  No-   heartburn, indigestion, abdominal pain, nausea, vomiting, GU: . MS:  No-   joint pain or swelling.   Neuro- grossly normal to observation, Or:  Psych:  No- change in mood or affect. No depression or anxiety.  No memory loss.    Objective:   Physical Exam  General- Alert, Oriented, Affect-appropriate, Distress- none acute  Abdominal obesity Skin- rash-none, lesions- none, excoriation- none Lymphadenopathy- none Head- atraumatic            Eyes- Gross vision intact, PERRLA, conjunctivae clear secretions            Ears- Hearing, canals normal            Nose- Clear, No-Septal dev, mucus, polyps, erosion, perforation             Throat- Mallampati II , mucosa clear , drainage- none, tonsils- atrophic Neck- flexible , trachea midline, no stridor , thyroid nl, carotid no bruit Chest - symmetrical excursion , unlabored           Heart/CV-  RRR , no murmur , no gallop  , no rub, nl s1 s2                           - JVD- none , edema- none, stasis changes- none, varices- none           Lung-  Wheeze-none, very distant, unlabored,  cough- light , dullness-none, rub- none           Chest wall-  Abd-  Br/ Gen/ Rectal- Not done, not indicated Extrem- cyanosis- none, clubbing, none, atrophy- none, strength- nl Neuro- grossly intact to observation

## 2013-10-25 ENCOUNTER — Other Ambulatory Visit: Payer: Self-pay | Admitting: Internal Medicine

## 2013-11-12 NOTE — Assessment & Plan Note (Signed)
Needing to maintain prednisone 10 mg alternating with 5 mg every other day. Plan-chest x-ray, refill prednisone, update pneumonia vaccine with Prevnar

## 2013-11-12 NOTE — Assessment & Plan Note (Signed)
No recurrence so far 

## 2013-12-11 ENCOUNTER — Inpatient Hospital Stay: Payer: Self-pay | Admitting: Internal Medicine

## 2013-12-11 DIAGNOSIS — I059 Rheumatic mitral valve disease, unspecified: Secondary | ICD-10-CM | POA: Diagnosis not present

## 2013-12-11 DIAGNOSIS — R059 Cough, unspecified: Secondary | ICD-10-CM | POA: Diagnosis not present

## 2013-12-11 DIAGNOSIS — J90 Pleural effusion, not elsewhere classified: Secondary | ICD-10-CM | POA: Diagnosis not present

## 2013-12-11 DIAGNOSIS — Z87891 Personal history of nicotine dependence: Secondary | ICD-10-CM | POA: Diagnosis not present

## 2013-12-11 DIAGNOSIS — D72829 Elevated white blood cell count, unspecified: Secondary | ICD-10-CM | POA: Diagnosis not present

## 2013-12-11 DIAGNOSIS — J189 Pneumonia, unspecified organism: Secondary | ICD-10-CM | POA: Diagnosis not present

## 2013-12-11 DIAGNOSIS — R05 Cough: Secondary | ICD-10-CM | POA: Diagnosis not present

## 2013-12-11 DIAGNOSIS — I498 Other specified cardiac arrhythmias: Secondary | ICD-10-CM | POA: Diagnosis not present

## 2013-12-11 DIAGNOSIS — J441 Chronic obstructive pulmonary disease with (acute) exacerbation: Secondary | ICD-10-CM | POA: Diagnosis not present

## 2013-12-11 DIAGNOSIS — I2699 Other pulmonary embolism without acute cor pulmonale: Secondary | ICD-10-CM | POA: Diagnosis not present

## 2013-12-11 DIAGNOSIS — A419 Sepsis, unspecified organism: Secondary | ICD-10-CM | POA: Diagnosis not present

## 2013-12-11 DIAGNOSIS — I252 Old myocardial infarction: Secondary | ICD-10-CM | POA: Diagnosis not present

## 2013-12-11 DIAGNOSIS — J9600 Acute respiratory failure, unspecified whether with hypoxia or hypercapnia: Secondary | ICD-10-CM | POA: Diagnosis not present

## 2013-12-11 DIAGNOSIS — IMO0002 Reserved for concepts with insufficient information to code with codable children: Secondary | ICD-10-CM | POA: Diagnosis not present

## 2013-12-11 DIAGNOSIS — R000 Tachycardia, unspecified: Secondary | ICD-10-CM | POA: Diagnosis not present

## 2013-12-11 DIAGNOSIS — R0602 Shortness of breath: Secondary | ICD-10-CM | POA: Diagnosis not present

## 2013-12-11 DIAGNOSIS — J984 Other disorders of lung: Secondary | ICD-10-CM | POA: Diagnosis not present

## 2013-12-11 DIAGNOSIS — R0902 Hypoxemia: Secondary | ICD-10-CM | POA: Diagnosis not present

## 2013-12-11 DIAGNOSIS — Z833 Family history of diabetes mellitus: Secondary | ICD-10-CM | POA: Diagnosis not present

## 2013-12-11 DIAGNOSIS — R040 Epistaxis: Secondary | ICD-10-CM | POA: Diagnosis present

## 2013-12-11 DIAGNOSIS — Z79899 Other long term (current) drug therapy: Secondary | ICD-10-CM | POA: Diagnosis not present

## 2013-12-11 DIAGNOSIS — I2510 Atherosclerotic heart disease of native coronary artery without angina pectoris: Secondary | ICD-10-CM | POA: Diagnosis present

## 2013-12-11 DIAGNOSIS — Z9089 Acquired absence of other organs: Secondary | ICD-10-CM | POA: Diagnosis not present

## 2013-12-11 DIAGNOSIS — Z7982 Long term (current) use of aspirin: Secondary | ICD-10-CM | POA: Diagnosis not present

## 2013-12-11 DIAGNOSIS — I82409 Acute embolism and thrombosis of unspecified deep veins of unspecified lower extremity: Secondary | ICD-10-CM | POA: Diagnosis not present

## 2013-12-11 DIAGNOSIS — Z85118 Personal history of other malignant neoplasm of bronchus and lung: Secondary | ICD-10-CM | POA: Diagnosis not present

## 2013-12-11 DIAGNOSIS — I824Z9 Acute embolism and thrombosis of unspecified deep veins of unspecified distal lower extremity: Secondary | ICD-10-CM | POA: Diagnosis not present

## 2013-12-11 DIAGNOSIS — I10 Essential (primary) hypertension: Secondary | ICD-10-CM | POA: Diagnosis present

## 2013-12-11 LAB — CBC
HCT: 35.6 % — ABNORMAL LOW (ref 40.0–52.0)
HGB: 11.8 g/dL — ABNORMAL LOW (ref 13.0–18.0)
MCH: 30.4 pg (ref 26.0–34.0)
MCHC: 33.3 g/dL (ref 32.0–36.0)
MCV: 91 fL (ref 80–100)
PLATELETS: 333 10*3/uL (ref 150–440)
RBC: 3.89 10*6/uL — ABNORMAL LOW (ref 4.40–5.90)
RDW: 14.8 % — AB (ref 11.5–14.5)
WBC: 16.1 10*3/uL — ABNORMAL HIGH (ref 3.8–10.6)

## 2013-12-11 LAB — TROPONIN I: Troponin-I: <0.02 ng/mL

## 2013-12-11 LAB — BASIC METABOLIC PANEL
ANION GAP: 10 (ref 7–16)
BUN: 14 mg/dL (ref 7–18)
CHLORIDE: 101 mmol/L (ref 98–107)
Calcium, Total: 9.2 mg/dL (ref 8.5–10.1)
Co2: 23 mmol/L (ref 21–32)
Creatinine: 1.05 mg/dL (ref 0.60–1.30)
EGFR (African American): >60
Glucose: 106 mg/dL — ABNORMAL HIGH (ref 65–99)
OSMOLALITY: 269 (ref 275–301)
Potassium: 3.9 mmol/L (ref 3.5–5.1)
SODIUM: 134 mmol/L — AB (ref 136–145)

## 2013-12-11 LAB — PROTIME-INR
INR: 1
PROTHROMBIN TIME: 13.2 s (ref 11.5–14.7)

## 2013-12-11 LAB — PRO B NATRIURETIC PEPTIDE: B-Type Natriuretic Peptide: 276 pg/mL — ABNORMAL HIGH (ref 0–125)

## 2013-12-12 DIAGNOSIS — I059 Rheumatic mitral valve disease, unspecified: Secondary | ICD-10-CM

## 2013-12-12 LAB — CBC WITH DIFFERENTIAL/PLATELET
BASOS PCT: 0.3 %
Basophil #: 0 10*3/uL (ref 0.0–0.1)
Eosinophil #: 0 10*3/uL (ref 0.0–0.7)
Eosinophil %: 0.1 %
HCT: 36.4 % — ABNORMAL LOW (ref 40.0–52.0)
HGB: 11.5 g/dL — ABNORMAL LOW (ref 13.0–18.0)
LYMPHS PCT: 4.9 %
Lymphocyte #: 0.8 10*3/uL — ABNORMAL LOW (ref 1.0–3.6)
MCH: 28.9 pg (ref 26.0–34.0)
MCHC: 31.6 g/dL — AB (ref 32.0–36.0)
MCV: 91 fL (ref 80–100)
MONO ABS: 0.3 x10 3/mm (ref 0.2–1.0)
Monocyte %: 1.7 %
NEUTROS ABS: 15.2 10*3/uL — AB (ref 1.4–6.5)
Neutrophil %: 93 %
PLATELETS: 345 10*3/uL (ref 150–440)
RBC: 3.98 10*6/uL — ABNORMAL LOW (ref 4.40–5.90)
RDW: 14.9 % — ABNORMAL HIGH (ref 11.5–14.5)
WBC: 16.3 10*3/uL — ABNORMAL HIGH (ref 3.8–10.6)

## 2013-12-12 LAB — BASIC METABOLIC PANEL
ANION GAP: 10 (ref 7–16)
BUN: 19 mg/dL — AB (ref 7–18)
CHLORIDE: 100 mmol/L (ref 98–107)
Calcium, Total: 9.6 mg/dL (ref 8.5–10.1)
Co2: 25 mmol/L (ref 21–32)
Creatinine: 1.04 mg/dL (ref 0.60–1.30)
EGFR (Non-African Amer.): >60
Glucose: 151 mg/dL — ABNORMAL HIGH (ref 65–99)
Osmolality: 275 (ref 275–301)
POTASSIUM: 4.2 mmol/L (ref 3.5–5.1)
Sodium: 135 mmol/L — ABNORMAL LOW (ref 136–145)

## 2013-12-13 LAB — CBC WITH DIFFERENTIAL/PLATELET
BASOS PCT: 0.1 %
Basophil #: 0 10*3/uL (ref 0.0–0.1)
Eosinophil #: 0 10*3/uL (ref 0.0–0.7)
Eosinophil %: 0 %
HCT: 34.2 % — AB (ref 40.0–52.0)
HGB: 10.9 g/dL — AB (ref 13.0–18.0)
LYMPHS ABS: 1.1 10*3/uL (ref 1.0–3.6)
LYMPHS PCT: 3.1 %
MCH: 29 pg (ref 26.0–34.0)
MCHC: 32 g/dL (ref 32.0–36.0)
MCV: 91 fL (ref 80–100)
Monocyte #: 1.3 x10 3/mm — ABNORMAL HIGH (ref 0.2–1.0)
Monocyte %: 3.5 %
Neutrophil #: 34.1 10*3/uL — ABNORMAL HIGH (ref 1.4–6.5)
Neutrophil %: 93.3 %
Platelet: 429 10*3/uL (ref 150–440)
RBC: 3.77 10*6/uL — ABNORMAL LOW (ref 4.40–5.90)
RDW: 15.1 % — AB (ref 11.5–14.5)
WBC: 36.5 10*3/uL — ABNORMAL HIGH (ref 3.8–10.6)

## 2013-12-14 LAB — CBC WITH DIFFERENTIAL/PLATELET
Bands: 1 %
Comment - H1-Com2: NORMAL
HCT: 34.9 % — AB (ref 40.0–52.0)
HGB: 11 g/dL — AB (ref 13.0–18.0)
Lymphocytes: 5 %
MCH: 28.4 pg (ref 26.0–34.0)
MCHC: 31.5 g/dL — AB (ref 32.0–36.0)
MCV: 90 fL (ref 80–100)
MONOS PCT: 5 %
Metamyelocyte: 4 %
PLATELETS: 469 10*3/uL — AB (ref 150–440)
RBC: 3.87 10*6/uL — ABNORMAL LOW (ref 4.40–5.90)
RDW: 14.9 % — ABNORMAL HIGH (ref 11.5–14.5)
Segmented Neutrophils: 85 %
WBC: 35.2 10*3/uL — AB (ref 3.8–10.6)

## 2013-12-14 LAB — CLOSTRIDIUM DIFFICILE(ARMC)

## 2013-12-15 LAB — CBC WITH DIFFERENTIAL/PLATELET
Basophil #: 0.1 10*3/uL (ref 0.0–0.1)
Basophil %: 0.3 %
EOS PCT: 0.5 %
Eosinophil #: 0.1 10*3/uL (ref 0.0–0.7)
HCT: 36.5 % — ABNORMAL LOW (ref 40.0–52.0)
HGB: 11.9 g/dL — ABNORMAL LOW (ref 13.0–18.0)
LYMPHS ABS: 2.3 10*3/uL (ref 1.0–3.6)
Lymphocyte %: 7.6 %
MCH: 29.6 pg (ref 26.0–34.0)
MCHC: 32.6 g/dL (ref 32.0–36.0)
MCV: 91 fL (ref 80–100)
MONO ABS: 1.7 x10 3/mm — AB (ref 0.2–1.0)
Monocyte %: 5.7 %
NEUTROS ABS: 25.8 10*3/uL — AB (ref 1.4–6.5)
Neutrophil %: 85.9 %
Platelet: 461 10*3/uL — ABNORMAL HIGH (ref 150–440)
RBC: 4.03 10*6/uL — AB (ref 4.40–5.90)
RDW: 14.8 % — AB (ref 11.5–14.5)
WBC: 30.1 10*3/uL — AB (ref 3.8–10.6)

## 2013-12-15 LAB — WBCS, STOOL

## 2013-12-16 LAB — CBC WITH DIFFERENTIAL/PLATELET
BASOS ABS: 0.1 10*3/uL (ref 0.0–0.1)
Basophil %: 0.3 %
EOS ABS: 0.1 10*3/uL (ref 0.0–0.7)
Eosinophil %: 0.4 %
HCT: 37.6 % — AB (ref 40.0–52.0)
HGB: 12 g/dL — AB (ref 13.0–18.0)
LYMPHS ABS: 2.2 10*3/uL (ref 1.0–3.6)
Lymphocyte %: 6.5 %
MCH: 29.1 pg (ref 26.0–34.0)
MCHC: 31.9 g/dL — ABNORMAL LOW (ref 32.0–36.0)
MCV: 91 fL (ref 80–100)
MONO ABS: 1.3 x10 3/mm — AB (ref 0.2–1.0)
MONOS PCT: 3.9 %
NEUTROS ABS: 30.4 10*3/uL — AB (ref 1.4–6.5)
Neutrophil %: 88.9 %
Platelet: 432 10*3/uL (ref 150–440)
RBC: 4.12 10*6/uL — AB (ref 4.40–5.90)
RDW: 14.6 % — ABNORMAL HIGH (ref 11.5–14.5)
WBC: 34.1 10*3/uL — AB (ref 3.8–10.6)

## 2013-12-16 LAB — CREATININE, SERUM
CREATININE: 1.06 mg/dL (ref 0.60–1.30)
EGFR (African American): >60
EGFR (Non-African Amer.): >60

## 2013-12-16 LAB — CULTURE, BLOOD (SINGLE)

## 2013-12-18 ENCOUNTER — Telehealth: Payer: Self-pay | Admitting: Internal Medicine

## 2013-12-18 DIAGNOSIS — J189 Pneumonia, unspecified organism: Secondary | ICD-10-CM

## 2013-12-18 LAB — PSA: PSA: 1 ng/mL (ref 0.0–4.0)

## 2013-12-18 NOTE — Telephone Encounter (Signed)
Called spoke with patient who reported he was discharged from [facility] on 3.21.15 for "blood clots and pneumonia."  Per pt he was instructed to follow up with this office within 1 week and was discharged on 5 days of cefuroxime.  CDY with no openings for the next few weeks.  HFU with TP scheduled for 3.27.15 @ 3pm, to arrive early for cxr.  Order placed.  Pt okay with this date/time/provider.  Nothing further needed at this time; will sign off.

## 2013-12-22 ENCOUNTER — Ambulatory Visit (INDEPENDENT_AMBULATORY_CARE_PROVIDER_SITE_OTHER)
Admission: RE | Admit: 2013-12-22 | Discharge: 2013-12-22 | Disposition: A | Discharge status: Home / Self Care | Payer: Medicare Other | Source: Ambulatory Visit | Attending: Adult Health | Admitting: Adult Health

## 2013-12-22 ENCOUNTER — Ambulatory Visit (INDEPENDENT_AMBULATORY_CARE_PROVIDER_SITE_OTHER): Payer: Medicare Other | Admitting: Adult Health

## 2013-12-22 ENCOUNTER — Encounter: Payer: Self-pay | Admitting: Adult Health

## 2013-12-22 VITALS — BP 132/72 | HR 110 | Temp 97.8°F | Ht 66.0 in | Wt 188.2 lb

## 2013-12-22 DIAGNOSIS — J449 Chronic obstructive pulmonary disease, unspecified: Secondary | ICD-10-CM

## 2013-12-22 DIAGNOSIS — R918 Other nonspecific abnormal finding of lung field: Secondary | ICD-10-CM | POA: Diagnosis not present

## 2013-12-22 DIAGNOSIS — J189 Pneumonia, unspecified organism: Secondary | ICD-10-CM | POA: Diagnosis not present

## 2013-12-22 DIAGNOSIS — T81718A Complication of other artery following a procedure, not elsewhere classified, initial encounter: Secondary | ICD-10-CM | POA: Diagnosis not present

## 2013-12-22 DIAGNOSIS — I2699 Other pulmonary embolism without acute cor pulmonale: Secondary | ICD-10-CM | POA: Insufficient documentation

## 2013-12-22 NOTE — Assessment & Plan Note (Signed)
Compensated on present regimen Cont on Advair

## 2013-12-22 NOTE — Assessment & Plan Note (Signed)
Cont on xarelto  follow up Dr. Annamaria Boots  In 3-4 weeks and As needed   Obtain records from [facility]

## 2013-12-22 NOTE — Assessment & Plan Note (Signed)
Recent PNA w/ recent admission at [facility] abx  cxr w/ perisistent markings on left   Plan  Repeat cxr on return  No further abx at this time as clinically improved.

## 2013-12-22 NOTE — Patient Instructions (Signed)
Continue on Xarelto.  Follow up Dr. Annamaria Boots  In 3 weeks with chest xray  Please contact office for sooner follow up if symptoms do not improve or worsen or seek emergency care

## 2013-12-22 NOTE — Progress Notes (Signed)
Patient ID: [name], male    DOB: 07/27/1946, 68 y.o.   MRN: [id number]  HPI 06/08/11- 68 year old male former smoker followed for COPD, history of small cell cancer, history of pneumothorax.   PCP is Dr Asencion Noble Last here December 03, 2010- Since then had routine physical w/ Dr Willey Blade- okay except for the breathing part. He did use stand-by prednisone 3 months ago on trip to [place] and would like to keep some on hand.  He is okay today, asking about pertussis vaccine and wants flu vax. Last tetanus about 3 years ago- discussed. Has had more than one pneumovax. Had shingles vaccine.  Expects wheeze and dyspnea with exertion- unchanged. Spiriva didn't help- made him worse.   12/07/11-06/08/11- 68 year old male former smoker followed for COPD, history of small cell cancer, history of pneumothorax.   PCP is Dr Asencion Noble Did well through the winter until he caught a cold last week. Coughing clear mucus. Denies fever or purulent sputum. Feels he just needs more time to clear this. Used up last standby prescription for prednisone one month ago. Discussed change of delivery device for Combivent. Advair 250 works as well as Advair 500.  10/13/13- 68 year old male former smoker followed for COPD, history of small cell cancer, history of pneumothorax.  FOLLOWS NWG:[id number] about the same since last visit; continues to use Advair with success. Prednisone alternating 10 mg with 5 mg every other day. CXR 12/14/12 IMPRESSION: Underlying emphysema. Postoperative change in the  left. Bibasilar scarring. No edema or consolidation. Essentially  stable chest appearance compared to prior study.  Original Report Authenticated By: Lowella Grip, M.D.   12/22/2013 [facility] follow up --68 year old male former smoker followed for COPD, history of small cell cancer, history of pneumothorax. Patient returns for a post hospital followup. He was admitted at [facility] on March 16 and found  to have an acute pulmonary embolus. CT chest showed pulmonary embolus involving the right middle lobe and posterior, right lower lobe pulmonary arteries. Patchy peripheral consolidation in the right middle lobe and right base, consistent with infarct. Says he developed worsening dyspnea after recent URI that prompted the ER visit. Also had a DVT in right lower leg.  Pt was also tx for possible PNA on left. Was tx w/ abx, says he finished them yesterday.  Started on Xarelto .  Records requested from [facility].  Since discharge he is feeling some better, but gets winded easily . Still weak.  Can walk on flat surfaces ok , incline makes it harder.  No chest pain, hemopytsis, leg swelling, n/v/d., orthopnea.  Remains on Advair and prednisone 5mg  alt 10mg  .       Review of Systems- see HPI Constitutional:   No  weight loss, night sweats,  Fevers, chills,  +fatigue, or  lassitude.  HEENT:   No headaches,  Difficulty swallowing,  Tooth/dental problems, or  Sore throat,                No sneezing, itching, ear ache, nasal congestion, post nasal drip,   CV:  No chest pain,  Orthopnea, PND, swelling in lower extremities, anasarca, dizziness, palpitations, syncope.   GI  No heartburn, indigestion, abdominal pain, nausea, vomiting, diarrhea, change in bowel habits, loss of appetite, bloody stools.   Resp:    No chest wall deformity  Skin: no rash or lesions.  GU: no dysuria, change in color of urine, no urgency or frequency.  No flank pain, no hematuria  MS:  No joint pain or swelling.  No decreased range of motion.  No back pain.  Psych:  No change in mood or affect. No depression or anxiety.  No memory loss.         Objective:   Physical Exam GEN: A/Ox3; pleasant , NAD, elderly   HEENT:  [place]/AT,  EACs-clear, TMs-wnl, NOSE-clear, THROAT-clear, no lesions, no postnasal drip or exudate noted.   NECK:  Supple w/ fair ROM; no JVD; normal carotid impulses w/o bruits; no thyromegaly or  nodules palpated; no lymphadenopathy.  RESP  Diminshed BS in bases no accessory muscle use, no dullness to percussion  CARD:  RRR, no m/r/g  , tr peripheral edema, pulses intact, no cyanosis or clubbing.  GI:   Soft & nt; nml bowel sounds; no organomegaly or masses detected.  Musco: Warm bil, no deformities or joint swelling noted.   Neuro: alert, no focal deficits noted.    Skin: Warm, no lesions or rashes

## 2013-12-26 ENCOUNTER — Ambulatory Visit (INDEPENDENT_AMBULATORY_CARE_PROVIDER_SITE_OTHER): Payer: Medicare Other | Admitting: Cardiovascular Disease

## 2013-12-26 ENCOUNTER — Encounter: Payer: Self-pay | Admitting: Cardiovascular Disease

## 2013-12-26 VITALS — BP 140/60 | HR 92 | Ht 67.0 in | Wt 177.0 lb

## 2013-12-26 DIAGNOSIS — I10 Essential (primary) hypertension: Secondary | ICD-10-CM

## 2013-12-26 DIAGNOSIS — R0602 Shortness of breath: Secondary | ICD-10-CM | POA: Diagnosis not present

## 2013-12-26 DIAGNOSIS — S40269A Insect bite (nonvenomous) of unspecified shoulder, initial encounter: Secondary | ICD-10-CM

## 2013-12-26 DIAGNOSIS — I214 Non-ST elevation (NSTEMI) myocardial infarction: Secondary | ICD-10-CM

## 2013-12-26 DIAGNOSIS — S40869A Insect bite (nonvenomous) of unspecified upper arm, initial encounter: Secondary | ICD-10-CM | POA: Insufficient documentation

## 2013-12-26 DIAGNOSIS — E785 Hyperlipidemia, unspecified: Secondary | ICD-10-CM

## 2013-12-26 DIAGNOSIS — I2699 Other pulmonary embolism without acute cor pulmonale: Secondary | ICD-10-CM

## 2013-12-26 DIAGNOSIS — W57XXXA Bitten or stung by nonvenomous insect and other nonvenomous arthropods, initial encounter: Secondary | ICD-10-CM | POA: Diagnosis not present

## 2013-12-26 DIAGNOSIS — J449 Chronic obstructive pulmonary disease, unspecified: Secondary | ICD-10-CM

## 2013-12-26 NOTE — Assessment & Plan Note (Signed)
Cholesterol is at goal on the current lipid regimen. No changes to the medications were made.  

## 2013-12-26 NOTE — Assessment & Plan Note (Signed)
Followed by Dr. Annamaria Boots. He feels that he is back to his baseline

## 2013-12-26 NOTE — Progress Notes (Signed)
Quick Note:  LMOM TCB x1. ______ 

## 2013-12-26 NOTE — Patient Instructions (Signed)
You are doing well. No medication changes were made.  Please call us if you have new issues that need to be addressed before your next appt.  Your physician wants you to follow-up in: 6 months.  You will receive a reminder letter in the mail two months in advance. If you don't receive a letter, please call our office to schedule the follow-up appointment.   

## 2013-12-26 NOTE — Progress Notes (Signed)
Quick Note:  Patient returned call. He is aware he will need to have another cxr prior to his 5.1.15 appt with CDY. Pt verbalized his understanding and denied any questions. ______

## 2013-12-26 NOTE — Assessment & Plan Note (Signed)
Samples of anticoagulation/xarelto given today, 20 mg. Encouraged him to follow up with pulmonary. He reports being very sedentary through the winter. Encouraged him to wear his compression hose when not working in his garden

## 2013-12-26 NOTE — Assessment & Plan Note (Signed)
Tick was removed after some difficulty. It was felt that all of the insect was extracted. Suggested he followup with primary care.

## 2013-12-26 NOTE — Assessment & Plan Note (Signed)
Blood pressure is well controlled on today's visit. No changes made to the medications. 

## 2013-12-26 NOTE — Assessment & Plan Note (Signed)
Back to his baseline mild chronic shortness of breath. Encouraged him to stay on his anticoagulation for now. Concerning that he developed DVT and PE without precipitating cause.

## 2013-12-26 NOTE — Progress Notes (Signed)
Patient ID: [name], male    DOB: [birth year], 68 y.o.   MRN: [id number]  HPI Comments: [name] is a pleasant 68 year old gentleman with history of lung cancer, COPD who had a non-ST elevation MI after choking on a vitamin pill, admission to [facility] 05/09/2012 with peak troponin 1.5. He had a cardiac catheterization that showed nonocclusive coronary artery disease, several regions of 20% stenosis otherwise normal ejection fraction, normal echocardiogram. He presents for routine followup  Lab work in the hospital showed BNP 142, peak troponin 1.5,   He presents today after recent hospital admission December 12 2011 with discharge 12/16/2013. Final diagnosis was acute respiratory failure, pneumonia with clinical sepsis, DVT with PE. He was started on xarelto initially 15 mg twice a day. He is scheduled to start 20 mg daily next week. Shortness of breath has significantly improved since his hospital admission. Echocardiogram 12/13/2011 showed normal LV function, moderately elevated right ventricular systolic pressure Ultrasound of the leg showed DVT in the right posterior tibial vein branch otherwise no other DVT CT scan of the chest showed PE of the right middle lobe and posterior basal right lower lobe pulmonary arteries  Tolerating low-dose Lipitor. Overall he reports that he is doing well. He was out in his garden yesterday and we noted a tick on his left side, lower armpit area. He did not know he had this This was removed during his visit today.  He is active and manages a large garden. He denies any significant chest pain or shortness of breath.  on chronic steroids for lung disease. Prior lung cancer 15 years ago with treatment at that time  Total cholesterol 158, LDL 65, HDL 54  EKG shows normal sinus rhythm with rate 92 beats per minute, no significant ST or T wave changes    Outpatient Encounter Prescriptions as of 12/26/2013  Medication Sig  . acetaminophen (TYLENOL) 325 MG  tablet Take 650 mg by mouth as needed.    Marland Kitchen albuterol (PROAIR HFA) 108 (90 BASE) MCG/ACT inhaler Inhale 1-2 puffs into the lungs every 4 (four) hours as needed for wheezing or shortness of breath.  Marland Kitchen amLODipine (NORVASC) 5 MG tablet TAKE 1 TABLET BY MOUTH ONCE A DAY  . aspirin 81 MG EC tablet Take 81 mg by mouth daily. Swallow whole.  Marland Kitchen atorvastatin (LIPITOR) 10 MG tablet TAKE 1 TABLET BY MOUTH EVERY DAY  . Fluticasone-Salmeterol (ADVAIR DISKUS) 250-50 MCG/DOSE AEPB 1 puff then rinse mouth, twice daily  . losartan (COZAAR) 50 MG tablet Take 1 tablet by mouth daily.  . predniSONE (DELTASONE) 5 MG tablet ALTERNATE TAKING 2 TABLETS WITH 1 TABLET EVERY OTHER DAY  . traMADol (ULTRAM) 50 MG tablet Takes 2 tablets 4 times daily as needed for pain.  Marland Kitchen XARELTO 15 MG TABS tablet Take 1 tablet by mouth 2 (two) times daily.  . [DISCONTINUED] metoprolol succinate (TOPROL-XL) 25 MG 24 hr tablet 1/2 tab by mouth once daily    Review of Systems  Constitutional: Negative.   HENT: Negative.   Eyes: Negative.   Respiratory: Negative.   Cardiovascular: Negative.   Gastrointestinal: Negative.   Endocrine: Negative.   Musculoskeletal: Negative.   Skin: Negative.   Allergic/Immunologic: Negative.   Neurological: Negative.   Hematological: Negative.   Psychiatric/Behavioral: Negative.   All other systems reviewed and are negative.   BP 140/60  Pulse 92  Ht 5\' 7"  (1.702 m)  Wt 177 lb (80.287 kg)  BMI 27.72 kg/m2  Physical Exam  Nursing  note and vitals reviewed. Constitutional: He is oriented to person, place, and time. He appears well-developed and well-nourished.  Tick noted in his left armpit area, white spot on the back of the Tick. No bullseye or erythema noted around the site.  HENT:  Head: Normocephalic.  Nose: Nose normal.  Mouth/Throat: Oropharynx is clear and moist.  Eyes: Conjunctivae are normal. Pupils are equal, round, and reactive to light.  Neck: Normal range of motion. Neck supple.  No JVD present.  Cardiovascular: Normal rate, regular rhythm, S1 normal, S2 normal, normal heart sounds and intact distal pulses.  Exam reveals no gallop and no friction rub.   No murmur heard. Pulmonary/Chest: Effort normal and breath sounds normal. No respiratory distress. He has no wheezes. He has no rales. He exhibits no tenderness.  Abdominal: Soft. Bowel sounds are normal. He exhibits no distension. There is no tenderness.  Musculoskeletal: Normal range of motion. He exhibits no edema and no tenderness.  Lymphadenopathy:    He has no cervical adenopathy.  Neurological: He is alert and oriented to person, place, and time. Coordination normal.  Skin: Skin is warm and dry. No rash noted. No erythema.  Psychiatric: He has a normal mood and affect. His behavior is normal. Judgment and thought content normal.      Assessment and Plan

## 2013-12-27 ENCOUNTER — Encounter: Payer: Self-pay | Admitting: Cardiovascular Disease

## 2014-01-11 DIAGNOSIS — I10 Essential (primary) hypertension: Secondary | ICD-10-CM | POA: Diagnosis not present

## 2014-01-11 DIAGNOSIS — I2699 Other pulmonary embolism without acute cor pulmonale: Secondary | ICD-10-CM | POA: Diagnosis not present

## 2014-01-12 ENCOUNTER — Telehealth: Payer: Self-pay | Admitting: *Deleted

## 2014-01-12 NOTE — Telephone Encounter (Signed)
Spoke w/ pt.  Advised him that these meds were on his med list at his last ov and Dr. Rockey Situ is aware of the two together, to continue taking them as prescribed.  He asks that his last office note be sent to Dr. Willey Blade in [place].

## 2014-01-12 NOTE — Telephone Encounter (Signed)
Patient called and wants to know if he should take a baby asprin while on Xarelto 20mg .

## 2014-01-26 ENCOUNTER — Encounter: Payer: Self-pay | Admitting: Internal Medicine

## 2014-01-26 ENCOUNTER — Ambulatory Visit (INDEPENDENT_AMBULATORY_CARE_PROVIDER_SITE_OTHER)
Admission: RE | Admit: 2014-01-26 | Discharge: 2014-01-26 | Disposition: A | Discharge status: Home / Self Care | Payer: Medicare Other | Source: Ambulatory Visit | Attending: Internal Medicine | Admitting: Internal Medicine

## 2014-01-26 ENCOUNTER — Ambulatory Visit (INDEPENDENT_AMBULATORY_CARE_PROVIDER_SITE_OTHER): Payer: Medicare Other | Admitting: Internal Medicine

## 2014-01-26 ENCOUNTER — Other Ambulatory Visit (INDEPENDENT_AMBULATORY_CARE_PROVIDER_SITE_OTHER): Payer: Medicare Other

## 2014-01-26 ENCOUNTER — Encounter (INDEPENDENT_AMBULATORY_CARE_PROVIDER_SITE_OTHER): Payer: Self-pay

## 2014-01-26 VITALS — BP 130/80 | HR 91 | Ht 67.0 in | Wt 174.8 lb

## 2014-01-26 DIAGNOSIS — I2699 Other pulmonary embolism without acute cor pulmonale: Secondary | ICD-10-CM

## 2014-01-26 DIAGNOSIS — Z85118 Personal history of other malignant neoplasm of bronchus and lung: Secondary | ICD-10-CM

## 2014-01-26 DIAGNOSIS — J449 Chronic obstructive pulmonary disease, unspecified: Secondary | ICD-10-CM

## 2014-01-26 DIAGNOSIS — R590 Localized enlarged lymph nodes: Secondary | ICD-10-CM | POA: Insufficient documentation

## 2014-01-26 DIAGNOSIS — R079 Chest pain, unspecified: Secondary | ICD-10-CM | POA: Diagnosis not present

## 2014-01-26 DIAGNOSIS — C3490 Malignant neoplasm of unspecified part of unspecified bronchus or lung: Secondary | ICD-10-CM

## 2014-01-26 DIAGNOSIS — R599 Enlarged lymph nodes, unspecified: Secondary | ICD-10-CM | POA: Diagnosis not present

## 2014-01-26 LAB — CBC WITH DIFFERENTIAL/PLATELET
BASOS ABS: 0 10*3/uL (ref 0.0–0.1)
Basophils Relative: 0.4 % (ref 0.0–3.0)
EOS PCT: 3 % (ref 0.0–5.0)
Eosinophils Absolute: 0.3 10*3/uL (ref 0.0–0.7)
HCT: 36.1 % — ABNORMAL LOW (ref 39.0–52.0)
Hemoglobin: 12 g/dL — ABNORMAL LOW (ref 13.0–17.0)
Lymphocytes Relative: 7.7 % — ABNORMAL LOW (ref 12.0–46.0)
Lymphs Abs: 0.8 10*3/uL (ref 0.7–4.0)
MCHC: 33.2 g/dL (ref 30.0–36.0)
MCV: 91.7 fl (ref 78.0–100.0)
MONO ABS: 0.7 10*3/uL (ref 0.1–1.0)
Monocytes Relative: 6.1 % (ref 3.0–12.0)
Neutro Abs: 8.9 10*3/uL — ABNORMAL HIGH (ref 1.4–7.7)
Neutrophils Relative %: 82.8 % — ABNORMAL HIGH (ref 43.0–77.0)
PLATELETS: 321 10*3/uL (ref 150.0–400.0)
RBC: 3.94 Mil/uL — ABNORMAL LOW (ref 4.22–5.81)
RDW: 14.8 % — ABNORMAL HIGH (ref 11.5–14.6)
WBC: 10.7 10*3/uL — ABNORMAL HIGH (ref 4.5–10.5)

## 2014-01-26 LAB — BASIC METABOLIC PANEL
BUN: 12 mg/dL (ref 6–23)
CALCIUM: 9.5 mg/dL (ref 8.4–10.5)
CO2: 26 mEq/L (ref 19–32)
Chloride: 104 mEq/L (ref 96–112)
Creatinine, Ser: 0.7 mg/dL (ref 0.4–1.5)
GFR: 115.69 mL/min (ref >60.00)
GLUCOSE: 99 mg/dL (ref 70–99)
POTASSIUM: 4.5 meq/L (ref 3.5–5.1)
Sodium: 138 mEq/L (ref 135–145)

## 2014-01-26 MED ORDER — PREDNISONE 5 MG PO TABS: ORAL_TABLET | ORAL | Status: DC

## 2014-01-26 NOTE — Assessment & Plan Note (Signed)
We agreed to maintain prednisone 10 mg daily for now

## 2014-01-26 NOTE — Assessment & Plan Note (Signed)
Without known recurrence. CT scan 12/11/2013 showed mediastinal adenopathy Plan-followup chest CT with contrast to reassess mediastinal nodes

## 2014-01-26 NOTE — Progress Notes (Signed)
Patient ID: [name], male    DOB: [birth year], 68 y.o.   MRN: [id number]  HPI 06/08/11- 68 year old male former smoker followed for COPD, history of small cell cancer, history of pneumothorax.   PCP is Dr Asencion Noble Last here December 03, 2010- Since then had routine physical w/ Dr Willey Blade- okay except for the breathing part. He did use stand-by prednisone 3 months ago on trip to [place] and would like to keep some on hand.  He is okay today, asking about pertussis vaccine and wants flu vax. Last tetanus about 3 years ago- discussed. Has had more than one pneumovax. Had shingles vaccine.  Expects wheeze and dyspnea with exertion- unchanged. Spiriva didn't help- made him worse.   12/07/11-06/08/11- 68 year old male former smoker followed for COPD, history of small cell cancer, history of pneumothorax.   PCP is Dr Asencion Noble Did well through the winter until he caught a cold last week. Coughing clear mucus. Denies fever or purulent sputum. Feels he just needs more time to clear this. Used up last standby prescription for prednisone one month ago. Discussed change of delivery device for Combivent. Advair 250 works as well as Advair 500.  10/13/13- 68 year old male former smoker followed for COPD, history of small cell cancer, history of pneumothorax.  FOLLOWS [id number] about the same since last visit; continues to use Advair with success. Prednisone alternating 10 mg with 5 mg every other day. CXR 12/14/12 IMPRESSION: Underlying emphysema. Postoperative change in the  left. Bibasilar scarring. No edema or consolidation. Essentially  stable chest appearance compared to prior study.  Original Report Authenticated By: Lowella Grip, M.D.   12/22/2013 [facility] follow up --68 year old male former smoker followed for COPD, history of small cell cancer, history of pneumothorax. Patient returns for a post hospital followup. He was admitted at [facility] on March 16 and found  to have an acute pulmonary embolus. CT chest showed pulmonary embolus involving the right middle lobe and posterior, right lower lobe pulmonary arteries. Patchy peripheral consolidation in the right middle lobe and right base, consistent with infarct. Says he developed worsening dyspnea after recent URI that prompted the ER visit. Also had a DVT in right lower leg.  Pt was also tx for possible PNA on left. Was tx w/ abx, says he finished them yesterday.  Started on Xarelto .  Records requested from [facility].  Since discharge he is feeling some better, but gets winded easily . Still weak.  Can walk on flat surfaces ok , incline makes it harder.  No chest pain, hemopytsis, leg swelling, n/v/d., orthopnea.  Remains on Advair and prednisone 5mg  alt 10mg  .   01/26/14- 68 year old male former smoker followed for COPD, history of small cell cancer, history of pneumothorax, hx DVT/PE/Xarelto FOLLOWS FOR:  CXR done today-Still having SOB with exertion, tightness in chest and some  cough with clear mucus CT chest done 12/11/2013 at [facility] noted enlarged paratracheal and hilar nodes, as well as diagnosed pulmonary emboli. Clot was in the right calf based on Doppler. He is concerned about cost of Xarelto.still notes easy dyspnea on exertion some days. He feels better on the days he takes prednisone 10 mg rather than 5, asking to stay on 10 mg daily. CXR 01/26/14 IMPRESSION:  Improved opacities throughout the left lung with volume loss.  Baseline has not been achieved yet compared with January 2015.  Continued followup is warranted.  Electronically Signed  By: Maryclare Bean M.D.  On: 01/26/2014 09:17  ROS-see HPI Constitutional:   No-   weight loss, night sweats, fevers, chills, fatigue, lassitude. HEENT:   No-  headaches, difficulty swallowing, tooth/dental problems, sore throat,       No-  sneezing, itching, ear ache, nasal congestion, post nasal drip,  CV:  No-   chest pain, orthopnea, PND, swelling in lower  extremities, anasarca,                                  dizziness, palpitations Resp: + shortness of breath with exertion or at rest.              No-   productive cough,  No non-productive cough,  No- coughing up of blood.              No-   change in color of mucus.  No- wheezing.   Skin: No-   rash or lesions. GI:  No-   heartburn, indigestion, abdominal pain, nausea, vomiting,  GU:  MS:  No-   joint pain or swelling.   Neuro-     nothing unusual Psych:  No- change in mood or affect. No depression or anxiety.  No memory loss.    Objective:  OBJ- Physical Exam General- Alert, Oriented, Affect-appropriate, Distress- none acute Skin- rash-none, lesions- none, excoriation- none Lymphadenopathy- none Head- atraumatic            Eyes- Gross vision intact, PERRLA, conjunctivae and secretions clear            Ears- Hearing, canals-normal            Nose- Clear, no-Septal dev, mucus, polyps, erosion, perforation             Throat- Mallampati II , mucosa clear , drainage- none, tonsils- atrophic Neck- flexible , trachea midline, no stridor , thyroid nl, carotid no bruit Chest - symmetrical excursion , unlabored           Heart/CV- RRR , no murmur , no gallop  , no rub, nl s1 s2                           - JVD- none , edema- none, stasis changes- none, varices- none           Lung- clear to P&A/ distant, wheeze- none, cough- none , dullness-none, rub- none           Chest wall-  Abd-  Br/ Gen/ Rectal- Not done, not indicated Extrem- cyanosis- none, clubbing, none, atrophy- none, strength- nl Neuro- grossly intact to observation

## 2014-01-26 NOTE — Assessment & Plan Note (Signed)
Xarelto very expensive Plan- samples Xareelto 20 mg, coupon

## 2014-01-26 NOTE — Patient Instructions (Addendum)
Samples/ coupon for Xarelto 20 mg if available  Order- future CT chest with contrast " PE, hx lung CA, adenopathy on CT at [facility]"   To be done in about 1 week- after May 9  Order lab- CBC w diff, BMET  Increase prednisone to 10 mg daily. Refill sent.

## 2014-01-26 NOTE — Assessment & Plan Note (Signed)
Concern is this adenopathy, although it might be reactive after PE, but also be malignant given his past history. This would be an explanation for his PE, so it needs followup Plan-followup chest CT with contrast for diagnosis of mediastinal adenopathy, and 6 weeks after his March 16 pulmonary embolism. BMET for contrast tolerance.

## 2014-02-09 ENCOUNTER — Telehealth: Payer: Self-pay | Admitting: Internal Medicine

## 2014-02-09 NOTE — Telephone Encounter (Signed)
ATC line busy x 2 Then called back someone answered and then hung up. Tried calling back again and line was busy x 3  Called back again and line rang numerous times and no answer and no CM [facility]

## 2014-02-09 NOTE — Telephone Encounter (Signed)
Spoke with [name] at [employer] to clarify what we were doing CT for-- PE or ?CA If for PE and possible Cancer, will need 2 separate CTs Explained to [place] per CDY notes as seen below Mediastinal adenopathy - Deneise Lever, MD at 01/26/2014  8:01 PM      Status: Written Related Problem: Mediastinal adenopathy    Concern is this adenopathy, although it might be reactive after PE, but also be malignant given his past history. This would be an explanation for his PE, so it needs followup Plan-followup chest CT with contrast for diagnosis of mediastinal adenopathy, and 6 weeks after his March 16 pulmonary embolism. BMET for contrast tolerance.       SMALL CELL CARCINOMA OF THE LUNG, hx of - Deneise Lever, MD at 01/26/2014  8:01 PM      Status: Written Related Problem: SMALL CELL CARCINOMA OF THE LUNG, hx of    Without known recurrence. CT scan 12/11/2013 showed mediastinal adenopathy Plan-followup chest CT with contrast to reassess mediastinal nodes    [name] states nothing further needed. Will do CT for Mediastinal Adenopathy/?CA

## 2014-02-09 NOTE — Telephone Encounter (Signed)
[name] returned call regarding CT scheduled for tomorrow.

## 2014-02-12 ENCOUNTER — Ambulatory Visit: Payer: Self-pay | Admitting: Internal Medicine

## 2014-02-12 DIAGNOSIS — K7689 Other specified diseases of liver: Secondary | ICD-10-CM | POA: Diagnosis not present

## 2014-02-12 DIAGNOSIS — C3490 Malignant neoplasm of unspecified part of unspecified bronchus or lung: Secondary | ICD-10-CM | POA: Diagnosis not present

## 2014-02-12 DIAGNOSIS — R599 Enlarged lymph nodes, unspecified: Secondary | ICD-10-CM | POA: Diagnosis not present

## 2014-02-12 DIAGNOSIS — I2699 Other pulmonary embolism without acute cor pulmonale: Secondary | ICD-10-CM | POA: Diagnosis not present

## 2014-02-12 DIAGNOSIS — Z902 Acquired absence of lung [part of]: Secondary | ICD-10-CM | POA: Diagnosis not present

## 2014-02-12 DIAGNOSIS — Z85118 Personal history of other malignant neoplasm of bronchus and lung: Secondary | ICD-10-CM | POA: Diagnosis not present

## 2014-03-02 ENCOUNTER — Ambulatory Visit (INDEPENDENT_AMBULATORY_CARE_PROVIDER_SITE_OTHER): Payer: Medicare Other | Admitting: Internal Medicine

## 2014-03-02 ENCOUNTER — Encounter: Payer: Self-pay | Admitting: Internal Medicine

## 2014-03-02 ENCOUNTER — Ambulatory Visit (INDEPENDENT_AMBULATORY_CARE_PROVIDER_SITE_OTHER)
Admission: RE | Admit: 2014-03-02 | Discharge: 2014-03-02 | Disposition: A | Discharge status: Home / Self Care | Payer: Medicare Other | Source: Ambulatory Visit | Attending: Internal Medicine | Admitting: Internal Medicine

## 2014-03-02 ENCOUNTER — Other Ambulatory Visit (INDEPENDENT_AMBULATORY_CARE_PROVIDER_SITE_OTHER): Payer: Medicare Other

## 2014-03-02 VITALS — BP 128/74 | HR 96 | Ht 67.0 in | Wt 169.4 lb

## 2014-03-02 DIAGNOSIS — J449 Chronic obstructive pulmonary disease, unspecified: Secondary | ICD-10-CM

## 2014-03-02 DIAGNOSIS — R918 Other nonspecific abnormal finding of lung field: Secondary | ICD-10-CM | POA: Diagnosis not present

## 2014-03-02 DIAGNOSIS — I2699 Other pulmonary embolism without acute cor pulmonale: Secondary | ICD-10-CM

## 2014-03-02 DIAGNOSIS — R0602 Shortness of breath: Secondary | ICD-10-CM

## 2014-03-02 LAB — CBC WITH DIFFERENTIAL/PLATELET
Basophils Absolute: 0 10*3/uL (ref 0.0–0.1)
Basophils Relative: 0.2 % (ref 0.0–3.0)
EOS ABS: 0.2 10*3/uL (ref 0.0–0.7)
Eosinophils Relative: 2.3 % (ref 0.0–5.0)
HCT: 39.6 % (ref 39.0–52.0)
Hemoglobin: 13.2 g/dL (ref 13.0–17.0)
Lymphocytes Relative: 8 % — ABNORMAL LOW (ref 12.0–46.0)
Lymphs Abs: 0.8 10*3/uL (ref 0.7–4.0)
MCHC: 33.3 g/dL (ref 30.0–36.0)
MCV: 91.1 fl (ref 78.0–100.0)
MONO ABS: 0.7 10*3/uL (ref 0.1–1.0)
Monocytes Relative: 7.1 % (ref 3.0–12.0)
NEUTROS PCT: 82.4 % — AB (ref 43.0–77.0)
Neutro Abs: 8.5 10*3/uL — ABNORMAL HIGH (ref 1.4–7.7)
PLATELETS: 322 10*3/uL (ref 150.0–400.0)
RBC: 4.35 Mil/uL (ref 4.22–5.81)
RDW: 14.8 % (ref 11.5–15.5)
WBC: 10.3 10*3/uL (ref 4.0–10.5)

## 2014-03-02 LAB — PROTIME-INR
INR: 1.4 ratio — AB (ref 0.8–1.0)
PROTHROMBIN TIME: 14.9 s — AB (ref 9.6–13.1)

## 2014-03-02 MED ORDER — WARFARIN SODIUM 5 MG PO TABS: 5.0000 mg | ORAL_TABLET | Freq: Every day | ORAL | Status: DC

## 2014-03-02 NOTE — Assessment & Plan Note (Signed)
Controlled. RA sat 96% today.  Plan continue walking

## 2014-03-02 NOTE — Assessment & Plan Note (Signed)
He cannot afford Xolair. We discussed change to coumadin.  Plan- use up current Xarelto supply. Refer to coumadin clinic/[place]. Initial script written for 5 mg tabs, 2 daily. Preliminary discussion of diet etc, needs reinforcement. Duration of therapy at least 6 months total, but he is at increased risk of recurrent DVT.  PT, PTT, INR

## 2014-03-02 NOTE — Progress Notes (Signed)
Patient ID: [name], male    DOB: 04-13-1946, 68 y.o.   MRN: [id number]  HPI 06/08/11- 68 year old male former smoker followed for COPD, history of small cell cancer, history of pneumothorax.   PCP is Dr Asencion Noble Last here December 03, 2010- Since then had routine physical w/ Dr Willey Blade- okay except for the breathing part. He did use stand-by prednisone 3 months ago on trip to [place] and would like to keep some on hand.  He is okay today, asking about pertussis vaccine and wants flu vax. Last tetanus about 3 years ago- discussed. Has had more than one pneumovax. Had shingles vaccine.  Expects wheeze and dyspnea with exertion- unchanged. Spiriva didn't help- made him worse.   12/07/11-06/08/11- 68 year old male former smoker followed for COPD, history of small cell cancer, history of pneumothorax.   PCP is Dr Asencion Noble Did well through the winter until he caught a cold last week. Coughing clear mucus. Denies fever or purulent sputum. Feels he just needs more time to clear this. Used up last standby prescription for prednisone one month ago. Discussed change of delivery device for Combivent. Advair 250 works as well as Advair 500.  10/13/13- 68 year old male former smoker followed for COPD, history of small cell cancer, history of pneumothorax.  FOLLOWS [id number] about the same since last visit; continues to use Advair with success. Prednisone alternating 10 mg with 5 mg every other day. CXR 12/14/12 IMPRESSION: Underlying emphysema. Postoperative change in the  left. Bibasilar scarring. No edema or consolidation. Essentially  stable chest appearance compared to prior study.  Original Report Authenticated By: Lowella Grip, M.D.   12/22/2013 [facility] follow up --68 year old male former smoker followed for COPD, history of small cell cancer, history of pneumothorax. Patient returns for a post hospital followup. He was admitted at [facility] on March 16 and found  to have an acute pulmonary embolus. CT chest showed pulmonary embolus involving the right middle lobe and posterior, right lower lobe pulmonary arteries. Patchy peripheral consolidation in the right middle lobe and right base, consistent with infarct. Says he developed worsening dyspnea after recent URI that prompted the ER visit. Also had a DVT in right lower leg.  Pt was also tx for possible PNA on left. Was tx w/ abx, says he finished them yesterday.  Started on Xarelto .  Records requested from [facility].  Since discharge he is feeling some better, but gets winded easily . Still weak.  Can walk on flat surfaces ok , incline makes it harder.  No chest pain, hemopytsis, leg swelling, n/v/d., orthopnea.  Remains on Advair and prednisone 5mg  alt 10mg  .   01/26/14- 67 year old male former smoker followed for COPD, history of small cell cancer, history of pneumothorax, hx DVT/PE/Xarelto FOLLOWS FOR:  CXR done today-Still having SOB with exertion, tightness in chest and some  cough with clear mucus CT chest done 12/11/2013 at [facility] noted enlarged paratracheal and hilar nodes, as well as diagnosed pulmonary emboli. Clot was in the right calf based on Doppler. He is concerned about cost of Xarelto.still notes easy dyspnea on exertion some days. He feels better on the days he takes prednisone 10 mg rather than 5, asking to stay on 10 mg daily. CXR 01/26/14 IMPRESSION:  Improved opacities throughout the left lung with volume loss.  Baseline has not been achieved yet compared with January 2015.  Continued followup is warranted.  Electronically Signed  By: Maryclare Bean M.D.  On: 01/26/2014 09:17  03/02/14-  68 year old male former smoker followed for COPD, history of small cell cancer, history of pneumothorax, hx DVT/PE/Xarelto(December 10, 2013) FOLLOWS FOR:  Breathing improved since last OV Little cough, no chest pain. Can now walk 1 mile daily w/o stopping, on room air.  Xarelto is too expensive, unsustainable.  Discussed change to coumadin. Easy bruise on xarelto but no overt bleed.   ROS-see HPI Constitutional:   No-   weight loss, night sweats, fevers, chills, fatigue, lassitude. HEENT:   No-  headaches, difficulty swallowing, tooth/dental problems, sore throat,       No-  sneezing, itching, ear ache, nasal congestion, post nasal drip,  CV:  No-   chest pain, orthopnea, PND, swelling in lower extremities, anasarca,                                  dizziness, palpitations Resp: + shortness of breath with exertion or at rest.              No-   productive cough,  No non-productive cough,  No- coughing up of blood.              No-   change in color of mucus.  No- wheezing.   Skin: No-   rash or lesions. GI:  No-   heartburn, indigestion, abdominal pain, nausea, vomiting,  GU:  MS:  No-   joint pain or swelling.   Neuro-     nothing unusual Psych:  No- change in mood or affect. No depression or anxiety.  No memory loss.    Objective:  OBJ- Physical Exam General- Alert, Oriented, Affect-appropriate, Distress- none acute Skin- rash-none, lesions- none, excoriation- none Lymphadenopathy- none Head- atraumatic            Eyes- Gross vision intact, PERRLA, conjunctivae and secretions clear            Ears- Hearing, canals-normal            Nose- Clear, no-Septal dev, mucus, polyps, erosion, perforation             Throat- Mallampati II , mucosa clear , drainage- none, tonsils- atrophic Neck- flexible , trachea midline, no stridor , thyroid nl, carotid no bruit Chest - symmetrical excursion , unlabored           Heart/CV- RRR , no murmur , no gallop  , no rub, nl s1 s2                           - JVD- none , edema- none, stasis changes- none, varices- none           Lung- clear to P&A/ distant, wheeze- none, cough- none , dullness-none, rub- none           Chest wall-  Abd-  Br/ Gen/ Rectal- Not done, not indicated Extrem- +prominent superficial leg veins, superficial varices, spider varices.  Some bruising. Neuro- grossly intact to observation

## 2014-03-02 NOTE — Progress Notes (Signed)
Quick Note:  lmtcb X1 to relay results. ______ 

## 2014-03-02 NOTE — Patient Instructions (Signed)
Order- CXR  COPD, hx lung Ca, hx PE  Order- lab- CBC w diff, PT,PTT       Dx pulmonary embolism  Stop Xarelto, then start coumadin/ warfarin at 10 mg daily (5mg  tabs) until advised by coumadin clinic  Order- refer to coumadin clinic downstairs. Hx PE. Changing from xarelto due to cost.

## 2014-03-05 ENCOUNTER — Telehealth: Payer: Self-pay | Admitting: Internal Medicine

## 2014-03-05 NOTE — Telephone Encounter (Signed)
Labs- Blood count and clotting tests are ok for change from Xarelto to coumadin, and will be available to the coumadin clinic   Pt aware of results. He is unsure if he wants to change to coumadin right now; will see his PCP this week and decide. He will keep [place] updated.

## 2014-03-05 NOTE — Telephone Encounter (Signed)
Noted. He was concerned about cost of Xarelto. If he chooses, he can stay on it instead of switching to coumadin.

## 2014-03-05 NOTE — Telephone Encounter (Signed)
I called made pt aware. Nothing further needed 

## 2014-03-06 ENCOUNTER — Telehealth: Payer: Self-pay | Admitting: General Practice

## 2014-03-06 NOTE — Telephone Encounter (Signed)
Message copied by Warden Fillers on Tue Mar 06, 2014 10:52 AM ------      Message from: Baird Lyons D      Created: Fri Mar 02, 2014  5:20 PM       That will work fine. Thank you for your help.      -Clint Young      ----- Message -----         From: Warden Fillers, RN         Sent: 03/02/2014  10:50 AM           To: Deneise Lever, MD            Hi Dr. Annamaria Boots,            I did just see patient.  Protocol is to bridge with Lovenox when changing from Xarelto to coumadin.  Just want to get your OK for that and I will be happy to dose it and call it in.                  Thanks,      Villa Herb, RN       ------

## 2014-03-06 NOTE — Telephone Encounter (Signed)
Spoke with patient regarding the switch from Xarelto to coumadin.  Patient not sure at this point if he wants to make the change.  Patient will call coumadin clinic after speaking with PCP this week.

## 2014-03-08 DIAGNOSIS — T81718A Complication of other artery following a procedure, not elsewhere classified, initial encounter: Secondary | ICD-10-CM | POA: Diagnosis not present

## 2014-03-08 DIAGNOSIS — I10 Essential (primary) hypertension: Secondary | ICD-10-CM | POA: Diagnosis not present

## 2014-03-08 DIAGNOSIS — I2699 Other pulmonary embolism without acute cor pulmonale: Secondary | ICD-10-CM | POA: Diagnosis not present

## 2014-03-09 ENCOUNTER — Other Ambulatory Visit: Payer: Self-pay | Admitting: General Practice

## 2014-03-09 ENCOUNTER — Telehealth: Payer: Self-pay | Admitting: General Practice

## 2014-03-09 MED ORDER — WARFARIN SODIUM 5 MG PO TABS: ORAL_TABLET | ORAL | Status: DC

## 2014-03-09 MED ORDER — ENOXAPARIN SODIUM 120 MG/0.8ML ~~LOC~~ SOLN: 1.5000 mg/kg | SUBCUTANEOUS | Status: DC

## 2014-03-09 NOTE — Telephone Encounter (Signed)
Spoke with patient regarding switching from Xarelto to Coumadin.  Instructed patient, starting Monday, 6-22 to take Coumadin, 5 mg and Lovenox for 4 days and to keep appointment in coumadin clinic Friday, 6-26.  Patient verbalized understanding.

## 2014-03-14 ENCOUNTER — Encounter: Payer: Self-pay | Admitting: Internal Medicine

## 2014-03-15 ENCOUNTER — Other Ambulatory Visit: Payer: Self-pay | Admitting: General Practice

## 2014-03-15 ENCOUNTER — Ambulatory Visit (INDEPENDENT_AMBULATORY_CARE_PROVIDER_SITE_OTHER): Payer: Medicare Other | Admitting: General Practice

## 2014-03-15 DIAGNOSIS — I2699 Other pulmonary embolism without acute cor pulmonale: Secondary | ICD-10-CM | POA: Diagnosis not present

## 2014-03-15 DIAGNOSIS — Z5181 Encounter for therapeutic drug level monitoring: Secondary | ICD-10-CM

## 2014-03-15 LAB — POCT INR: INR: 1.2

## 2014-03-15 MED ORDER — ENOXAPARIN SODIUM 120 MG/0.8ML ~~LOC~~ SOLN: 1.5000 mg/kg | SUBCUTANEOUS | Status: DC

## 2014-03-15 NOTE — Progress Notes (Signed)
Pre visit review using our clinic review tool, if applicable. No additional management support is needed unless otherwise documented below in the visit note. 

## 2014-03-15 NOTE — Patient Instructions (Signed)

## 2014-03-20 ENCOUNTER — Ambulatory Visit (INDEPENDENT_AMBULATORY_CARE_PROVIDER_SITE_OTHER): Payer: Medicare Other | Admitting: Family Medicine

## 2014-03-20 DIAGNOSIS — Z5181 Encounter for therapeutic drug level monitoring: Secondary | ICD-10-CM

## 2014-03-20 DIAGNOSIS — I2699 Other pulmonary embolism without acute cor pulmonale: Secondary | ICD-10-CM | POA: Diagnosis not present

## 2014-03-20 LAB — POCT INR: INR: 1.9

## 2014-03-27 ENCOUNTER — Ambulatory Visit (INDEPENDENT_AMBULATORY_CARE_PROVIDER_SITE_OTHER): Payer: Medicare Other | Admitting: General Practice

## 2014-03-27 DIAGNOSIS — I2699 Other pulmonary embolism without acute cor pulmonale: Secondary | ICD-10-CM | POA: Diagnosis not present

## 2014-03-27 DIAGNOSIS — Z5181 Encounter for therapeutic drug level monitoring: Secondary | ICD-10-CM

## 2014-03-27 LAB — POCT INR: INR: 1.6

## 2014-03-27 NOTE — Progress Notes (Signed)
Pre visit review using our clinic review tool, if applicable. No additional management support is needed unless otherwise documented below in the visit note. 

## 2014-04-03 ENCOUNTER — Ambulatory Visit (INDEPENDENT_AMBULATORY_CARE_PROVIDER_SITE_OTHER): Payer: Medicare Other | Admitting: General Practice

## 2014-04-03 DIAGNOSIS — I2699 Other pulmonary embolism without acute cor pulmonale: Secondary | ICD-10-CM | POA: Diagnosis not present

## 2014-04-03 DIAGNOSIS — Z5181 Encounter for therapeutic drug level monitoring: Secondary | ICD-10-CM | POA: Diagnosis not present

## 2014-04-03 LAB — POCT INR: INR: 2.5

## 2014-04-03 NOTE — Progress Notes (Signed)
Pre visit review using our clinic review tool, if applicable. No additional management support is needed unless otherwise documented below in the visit note. 

## 2014-04-17 ENCOUNTER — Ambulatory Visit (INDEPENDENT_AMBULATORY_CARE_PROVIDER_SITE_OTHER): Payer: Medicare Other | Admitting: General Practice

## 2014-04-17 DIAGNOSIS — I2699 Other pulmonary embolism without acute cor pulmonale: Secondary | ICD-10-CM | POA: Diagnosis not present

## 2014-04-17 DIAGNOSIS — Z5181 Encounter for therapeutic drug level monitoring: Secondary | ICD-10-CM

## 2014-04-17 LAB — POCT INR: INR: 2.6

## 2014-04-17 NOTE — Progress Notes (Signed)
Pre visit review using our clinic review tool, if applicable. No additional management support is needed unless otherwise documented below in the visit note. 

## 2014-05-09 ENCOUNTER — Ambulatory Visit (INDEPENDENT_AMBULATORY_CARE_PROVIDER_SITE_OTHER): Payer: Medicare Other | Admitting: Family Medicine

## 2014-05-09 DIAGNOSIS — Z5181 Encounter for therapeutic drug level monitoring: Secondary | ICD-10-CM

## 2014-05-09 DIAGNOSIS — I2699 Other pulmonary embolism without acute cor pulmonale: Secondary | ICD-10-CM | POA: Diagnosis not present

## 2014-05-09 LAB — POCT INR: INR: 4.9

## 2014-05-16 ENCOUNTER — Ambulatory Visit (INDEPENDENT_AMBULATORY_CARE_PROVIDER_SITE_OTHER): Payer: Medicare Other | Admitting: *Deleted

## 2014-05-16 DIAGNOSIS — I2699 Other pulmonary embolism without acute cor pulmonale: Secondary | ICD-10-CM

## 2014-05-16 DIAGNOSIS — Z5181 Encounter for therapeutic drug level monitoring: Secondary | ICD-10-CM

## 2014-05-16 LAB — POCT INR: INR: 2.3

## 2014-06-11 ENCOUNTER — Ambulatory Visit (INDEPENDENT_AMBULATORY_CARE_PROVIDER_SITE_OTHER): Payer: Medicare Other | Admitting: Cardiovascular Disease

## 2014-06-11 ENCOUNTER — Encounter: Payer: Self-pay | Admitting: Cardiovascular Disease

## 2014-06-11 VITALS — BP 132/70 | HR 85 | Ht 67.0 in | Wt 169.2 lb

## 2014-06-11 DIAGNOSIS — Z5181 Encounter for therapeutic drug level monitoring: Secondary | ICD-10-CM

## 2014-06-11 DIAGNOSIS — I158 Other secondary hypertension: Secondary | ICD-10-CM | POA: Diagnosis not present

## 2014-06-11 DIAGNOSIS — E785 Hyperlipidemia, unspecified: Secondary | ICD-10-CM | POA: Diagnosis not present

## 2014-06-11 DIAGNOSIS — I214 Non-ST elevation (NSTEMI) myocardial infarction: Secondary | ICD-10-CM

## 2014-06-11 NOTE — Progress Notes (Signed)
Patient ID: [name], male    DOB: 12-06-1945, 68 y.o.   MRN: [id number]  HPI Comments: Mr. Servantes is a pleasant 68 year old gentleman with history of lung cancer, COPD who had a non-ST elevation MI after choking on a vitamin pill, admission to [facility] 05/09/2012 with peak troponin 1.5. He had a cardiac catheterization that showed nonocclusive coronary artery disease, several regions of 20% stenosis otherwise normal ejection fraction, normal echocardiogram. He presents for routine followup  Lab work in the hospital showed BNP 142, peak troponin 1.5,    hospital admission December 12 2011 with discharge 12/16/2013. Final diagnosis was acute respiratory failure, pneumonia with clinical sepsis, DVT with PE. He was started on xarelto initially 15 mg twice a day then changed to 20 mg daily   In followup today, he reports that he is doing well. He is on warfarin, unable to handle the cost of xarelto.  So far has no complaints apart from bruising on his arms He is active, does not participate in a regular exercise program He is active and manages a large garden. He denies any significant chest pain or shortness of breath.  on chronic steroids for lung disease. Prior lung cancer 15 years ago with treatment at that time  Echocardiogram 12/13/2011 showed normal LV function, moderately elevated right ventricular systolic pressure Ultrasound of the leg showed DVT in the right posterior tibial vein branch otherwise no other DVT CT scan of the chest showed PE of the right middle lobe and posterior basal right lower lobe pulmonary arteries  Total cholesterol 158, LDL 65, HDL 54  EKG shows normal sinus rhythm with rate 85 beats per minute, no significant ST or T wave changes    Outpatient Encounter Prescriptions as of 06/11/2014  Medication Sig  . acetaminophen (TYLENOL) 325 MG tablet Take 650 mg by mouth as needed.    Marland Kitchen albuterol (PROAIR HFA) 108 (90 BASE) MCG/ACT inhaler Inhale 1-2 puffs into the lungs  every 4 (four) hours as needed for wheezing or shortness of breath.  Marland Kitchen amLODipine (NORVASC) 5 MG tablet TAKE 1 TABLET BY MOUTH ONCE A DAY  . aspirin 81 MG EC tablet Take 81 mg by mouth daily. Swallow whole.  Marland Kitchen atorvastatin (LIPITOR) 10 MG tablet TAKE 1 TABLET BY MOUTH EVERY DAY  . Fluticasone-Salmeterol (ADVAIR DISKUS) 250-50 MCG/DOSE AEPB 1 puff then rinse mouth, twice daily  . losartan (COZAAR) 50 MG tablet Take 1 tablet by mouth daily.  . predniSONE (DELTASONE) 5 MG tablet 2 daily  . traMADol (ULTRAM) 50 MG tablet Takes 2 tablets 4 times daily as needed for pain.  Marland Kitchen warfarin (COUMADIN) 5 MG tablet Take as directed by anticoagulation clinic    Review of Systems  Constitutional: Negative.   HENT: Negative.   Eyes: Negative.   Respiratory: Negative.   Cardiovascular: Negative.   Gastrointestinal: Negative.   Endocrine: Negative.   Musculoskeletal: Negative.   Skin: Negative.   Allergic/Immunologic: Negative.   Neurological: Negative.   Hematological: Negative.   Psychiatric/Behavioral: Negative.   All other systems reviewed and are negative.  BP 150/80  Pulse 85  Ht 5\' 7"  (1.702 m)  Wt 169 lb 4 oz (76.771 kg)  BMI 26.50 kg/m2  Physical Exam  Nursing note and vitals reviewed. Constitutional: He is oriented to person, place, and time. He appears well-developed and well-nourished.  Tick noted in his left armpit area, white spot on the back of the Tick. No bullseye or erythema noted around the site.  HENT:  Head: Normocephalic.  Nose: Nose normal.  Mouth/Throat: Oropharynx is clear and moist.  Eyes: Conjunctivae are normal. Pupils are equal, round, and reactive to light.  Neck: Normal range of motion. Neck supple. No JVD present.  Cardiovascular: Normal rate, regular rhythm, S1 normal, S2 normal, normal heart sounds and intact distal pulses.  Exam reveals no gallop and no friction rub.   No murmur heard. Pulmonary/Chest: Effort normal and breath sounds normal. No respiratory  distress. He has no wheezes. He has no rales. He exhibits no tenderness.  Abdominal: Soft. Bowel sounds are normal. He exhibits no distension. There is no tenderness.  Musculoskeletal: Normal range of motion. He exhibits no edema and no tenderness.  Lymphadenopathy:    He has no cervical adenopathy.  Neurological: He is alert and oriented to person, place, and time. Coordination normal.  Skin: Skin is warm and dry. No rash noted. No erythema.  Psychiatric: He has a normal mood and affect. His behavior is normal. Judgment and thought content normal.      Assessment and Plan

## 2014-06-11 NOTE — Patient Instructions (Signed)
You are doing well. No medication changes were made.  Please call us if you have new issues that need to be addressed before your next appt.  Your physician wants you to follow-up in: 12 months.  You will receive a reminder letter in the mail two months in advance. If you don't receive a letter, please call our office to schedule the follow-up appointment. 

## 2014-06-11 NOTE — Assessment & Plan Note (Signed)
He is tolerating warfarin apart from some bruising

## 2014-06-11 NOTE — Assessment & Plan Note (Signed)
Blood pressure is well controlled on today's visit. No changes made to the medications. 

## 2014-06-11 NOTE — Assessment & Plan Note (Signed)
Encouraged him to stay on his Lipitor

## 2014-06-13 ENCOUNTER — Ambulatory Visit (INDEPENDENT_AMBULATORY_CARE_PROVIDER_SITE_OTHER): Payer: Medicare Other | Admitting: *Deleted

## 2014-06-13 DIAGNOSIS — I2699 Other pulmonary embolism without acute cor pulmonale: Secondary | ICD-10-CM

## 2014-06-13 DIAGNOSIS — Z5181 Encounter for therapeutic drug level monitoring: Secondary | ICD-10-CM

## 2014-06-13 LAB — POCT INR: INR: 3.6

## 2014-06-18 DIAGNOSIS — Z79899 Other long term (current) drug therapy: Secondary | ICD-10-CM | POA: Diagnosis not present

## 2014-06-18 DIAGNOSIS — J449 Chronic obstructive pulmonary disease, unspecified: Secondary | ICD-10-CM | POA: Diagnosis not present

## 2014-06-18 DIAGNOSIS — C3410 Malignant neoplasm of upper lobe, unspecified bronchus or lung: Secondary | ICD-10-CM | POA: Diagnosis not present

## 2014-06-18 DIAGNOSIS — E785 Hyperlipidemia, unspecified: Secondary | ICD-10-CM | POA: Diagnosis not present

## 2014-06-26 DIAGNOSIS — Z0000 Encounter for general adult medical examination without abnormal findings: Secondary | ICD-10-CM | POA: Diagnosis not present

## 2014-06-26 DIAGNOSIS — Z23 Encounter for immunization: Secondary | ICD-10-CM | POA: Diagnosis not present

## 2014-06-27 ENCOUNTER — Other Ambulatory Visit: Payer: Self-pay | Admitting: *Deleted

## 2014-06-27 MED ORDER — ATORVASTATIN CALCIUM 10 MG PO TABS: ORAL_TABLET | ORAL | Status: DC

## 2014-07-02 ENCOUNTER — Encounter: Payer: Self-pay | Admitting: Internal Medicine

## 2014-07-02 ENCOUNTER — Ambulatory Visit (INDEPENDENT_AMBULATORY_CARE_PROVIDER_SITE_OTHER): Payer: Medicare Other | Admitting: Internal Medicine

## 2014-07-02 VITALS — BP 124/70 | HR 80 | Ht 66.0 in | Wt 174.2 lb

## 2014-07-02 DIAGNOSIS — I2699 Other pulmonary embolism without acute cor pulmonale: Secondary | ICD-10-CM | POA: Diagnosis not present

## 2014-07-02 DIAGNOSIS — J432 Centrilobular emphysema: Secondary | ICD-10-CM

## 2014-07-02 DIAGNOSIS — J930 Spontaneous tension pneumothorax: Secondary | ICD-10-CM | POA: Diagnosis not present

## 2014-07-02 MED ORDER — AMOXICILLIN-POT CLAVULANATE 875-125 MG PO TABS: 1.0000 | ORAL_TABLET | Freq: Two times a day (BID) | ORAL | Status: DC

## 2014-07-02 NOTE — Assessment & Plan Note (Signed)
Coumadin managed by Coumadin clinic/[facility]

## 2014-07-02 NOTE — Progress Notes (Signed)
Patient ID: [name], male    DOB: November 29, 1945, 68 y.o.   MRN: [id number]  HPI 06/08/11- 68 year old male former smoker followed for COPD, history of small cell cancer, history of pneumothorax.   PCP is Dr Asencion Noble Last here December 03, 2010- Since then had routine physical w/ Dr Willey Blade- okay except for the breathing part. He did use stand-by prednisone 3 months ago on trip to [place] and would like to keep some on hand.  He is okay today, asking about pertussis vaccine and wants flu vax. Last tetanus about 3 years ago- discussed. Has had more than one pneumovax. Had shingles vaccine.  Expects wheeze and dyspnea with exertion- unchanged. Spiriva didn't help- made him worse.   12/07/11-06/08/11- 68 year old male former smoker followed for COPD, history of small cell cancer, history of pneumothorax.   PCP is Dr Asencion Noble Did well through the winter until he caught a cold last week. Coughing clear mucus. Denies fever or purulent sputum. Feels he just needs more time to clear this. Used up last standby prescription for prednisone one month ago. Discussed change of delivery device for Combivent. Advair 250 works as well as Advair 500.  10/13/13- 68 year old male former smoker followed for COPD, history of small cell cancer, history of pneumothorax.  FOLLOWS TIR:[id number] about the same since last visit; continues to use Advair with success. Prednisone alternating 10 mg with 5 mg every other day. CXR 12/14/12 IMPRESSION: Underlying emphysema. Postoperative change in the  left. Bibasilar scarring. No edema or consolidation. Essentially  stable chest appearance compared to prior study.  Original Report Authenticated By: Lowella Grip, M.D.   12/22/2013 [facility] follow up --68 year old male former smoker followed for COPD, history of small cell cancer, history of pneumothorax. Patient returns for a post hospital followup. He was admitted at [facility] on March 16 and found  to have an acute pulmonary embolus. CT chest showed pulmonary embolus involving the right middle lobe and posterior, right lower lobe pulmonary arteries. Patchy peripheral consolidation in the right middle lobe and right base, consistent with infarct. Says he developed worsening dyspnea after recent URI that prompted the ER visit. Also had a DVT in right lower leg.  Pt was also tx for possible PNA on left. Was tx w/ abx, says he finished them yesterday.  Started on Xarelto .  Records requested from [facility].  Since discharge he is feeling some better, but gets winded easily . Still weak.  Can walk on flat surfaces ok , incline makes it harder.  No chest pain, hemopytsis, leg swelling, n/v/d., orthopnea.  Remains on Advair and prednisone 5mg  alt 10mg  .   01/26/14- 68 year old male former smoker followed for COPD, history of small cell cancer, history of pneumothorax, hx DVT/PE/Xarelto FOLLOWS FOR:  CXR done today-Still having SOB with exertion, tightness in chest and some  cough with clear mucus CT chest done 12/11/2013 [facility] noted enlarged paratracheal and hilar nodes, as well as diagnosed pulmonary emboli. Clot was in the right calf based on Doppler. He is concerned about cost of Xarelto.still notes easy dyspnea on exertion some days. He feels better on the days he takes prednisone 10 mg rather than 5, asking to stay on 10 mg daily. CXR 01/26/14 IMPRESSION:  Improved opacities throughout the left lung with volume loss.  Baseline has not been achieved yet compared with January 2015.  Continued followup is warranted.  Electronically Signed  By: Maryclare Bean M.D.  On: 01/26/2014 09:17  03/02/14-  68 year old male former smoker followed for COPD, history of small cell cancer, history of pneumothorax, hx DVT/PE/Xarelto(December 10, 2013) FOLLOWS FOR:  Breathing improved since last OV Little cough, no chest pain. Can now walk 1 mile daily w/o stopping, on room air.  Xarelto is too expensive, unsustainable.  Discussed change to coumadin. Easy bruise on xarelto but no overt bleed.   07/02/14- 68 year old male former smoker followed for COPD, history of small cell cancer, history of pneumothorax, hx DVT/PE/warfarin(December 10, 2013) FOLLOWS FOR: continues to cough up green colored mucus-recently had cold. Pt denies any wheezing, SOB, fever, or chills. Recent chest cold clearing very slowly with residual cough and some green sputum. No fever, blood or chest pain. Converted to warfarin because Xarelto was too expensive. Managed by Coumadin clinic. No bleeding.  ROS-see HPI Constitutional:   No-   weight loss, night sweats, fevers, chills, fatigue, lassitude. HEENT:   No-  headaches, difficulty swallowing, tooth/dental problems, sore throat,       No-  sneezing, itching, ear ache, nasal congestion, post nasal drip,  CV:  No-   chest pain, orthopnea, PND, swelling in lower extremities, anasarca,                                  dizziness, palpitations Resp: + shortness of breath with exertion or at rest.              + productive cough,  No non-productive cough,  No- coughing up of blood.              No-   change in color of mucus.  No- wheezing.   Skin: No-   rash or lesions. GI:  No-   heartburn, indigestion, abdominal pain, nausea, vomiting,  GU:  MS:  No-   joint pain or swelling.   Neuro-     nothing unusual Psych:  No- change in mood or affect. No depression or anxiety.  No memory loss.    Objective:  OBJ- Physical Exam General- Alert, Oriented, Affect-appropriate, Distress- none acute Skin- rash-none, lesions- none, excoriation- none Lymphadenopathy- none Head- atraumatic            Eyes- Gross vision intact, PERRLA, conjunctivae and secretions clear            Ears- Hearing, canals-normal            Nose- Clear, no-Septal dev, mucus, polyps, erosion, perforation             Throat- Mallampati II , mucosa clear , drainage- none, tonsils- atrophic Neck- flexible , trachea midline, no  stridor , thyroid nl, carotid no bruit Chest - symmetrical excursion , unlabored           Heart/CV- RRR , no murmur , no gallop  , no rub, nl s1 s2                           - JVD- none , edema- none, stasis changes- none, varices- none           Lung- + crackles right lateral chest/unlabored, wheeze- none, cough- none ,                                dullness-none, rub- none           Chest  wall-  Abd-  Br/ Gen/ Rectal- Not done, not indicated Extrem- +prominent superficial leg veins, superficial varices, spider varices. Some bruising. Neuro- grossly intact to observation

## 2014-07-02 NOTE — Patient Instructions (Signed)
Script for antibiotic sent  We can continue regular meds  Please call as needed

## 2014-07-02 NOTE — Assessment & Plan Note (Signed)
Recent acute exacerbation of bronchitis Plan- augmentin

## 2014-07-02 NOTE — Assessment & Plan Note (Signed)
No further recurrence

## 2014-07-11 ENCOUNTER — Ambulatory Visit (INDEPENDENT_AMBULATORY_CARE_PROVIDER_SITE_OTHER): Payer: Medicare Other | Admitting: *Deleted

## 2014-07-11 DIAGNOSIS — I2699 Other pulmonary embolism without acute cor pulmonale: Secondary | ICD-10-CM

## 2014-07-11 DIAGNOSIS — Z5181 Encounter for therapeutic drug level monitoring: Secondary | ICD-10-CM

## 2014-07-11 LAB — POCT INR: INR: 3.5

## 2014-07-13 ENCOUNTER — Telehealth: Payer: Self-pay

## 2014-07-13 NOTE — Telephone Encounter (Signed)
Spoke w/ pt.  He states that he was given instructions at the coumadin clinic to discontinue his lipitor.  Advised him that I see no indication of why this was done and Dr. Rockey Situ did not make any changes to his meds at last ov.  Advised him to call the doctor stopped this to discuss.  He verbalizes understanding and will call back w/ any questions or concerns.

## 2014-07-13 NOTE — Telephone Encounter (Signed)
Pt called, states he was taken off his Lipitor and not sure why. States his PCP took him off. Please call and advise

## 2014-07-23 ENCOUNTER — Other Ambulatory Visit: Payer: Self-pay | Admitting: Internal Medicine

## 2014-07-23 NOTE — Telephone Encounter (Signed)
Ok to refill 

## 2014-07-23 NOTE — Telephone Encounter (Signed)
12/22/2013 [facility] follow up --68 year old male former smoker followed for COPD, history of small cell cancer, history of pneumothorax.  Patient returns for a post hospital followup.  He was admitted at [facility] on March 16 and found to have an acute pulmonary embolus.  CT chest showed pulmonary embolus involving the right middle lobe and posterior, right lower lobe pulmonary arteries. Patchy peripheral consolidation in the right middle lobe and right base, consistent with infarct.  Says he developed worsening dyspnea after recent URI that prompted the ER visit. Also had a DVT in right lower leg.  Pt was also tx for possible PNA on left. Was tx w/ abx, says he finished them yesterday.  Started on Xarelto .  Records requested from [facility].  Since discharge he is feeling some better, but gets winded easily . Still weak.  Can walk on flat surfaces ok , incline makes it harder.  No chest pain, hemopytsis, leg swelling, n/v/d., orthopnea.  Remains on Advair and prednisone 5mg  alt 10mg  .   01/26/14- 68 year old male former smoker followed for COPD, history of small cell cancer, history of pneumothorax, hx DVT/PE/Xarelto  FOLLOWS FOR: CXR done today-Still having SOB with exertion, tightness in chest and some cough with clear mucus  CT chest done 12/11/2013 at [facility] noted enlarged paratracheal and hilar nodes, as well as diagnosed pulmonary emboli. Clot was in the right calf based on Doppler.  He is concerned about cost of Xarelto.still notes easy dyspnea on exertion some days. He feels better on the days he takes prednisone 10 mg rather than 5, asking to stay on 10 mg daily.  03/02/14- 68 year old male former smoker followed for COPD, history of small cell cancer, history of pneumothorax, hx DVT/PE/Xarelto(December 10, 2013)  FOLLOWS FOR: Breathing improved since last OV  Little cough, no chest pain. Can now walk 1 mile daily w/o stopping, on room air.  Xarelto is too expensive,  unsustainable. Discussed change to coumadin. Easy bruise on xarelto but no overt bleed.   07/02/14- 68 year old male former smoker followed for COPD, history of small cell cancer, history of pneumothorax, hx DVT/PE/warfarin(December 10, 2013)  FOLLOWS FOR: continues to cough up green colored mucus-recently had cold. Pt denies any wheezing, SOB, fever, or chills.  Recent chest cold clearing very slowly with residual cough and some green sputum. No fever, blood or chest pain.  Converted to warfarin because Xarelto was too expensive. Managed by Coumadin clinic. No bleeding.  CY, okay to refill in under your name? Thanks.

## 2014-07-26 ENCOUNTER — Telehealth: Payer: Self-pay | Admitting: Internal Medicine

## 2014-07-26 NOTE — Telephone Encounter (Signed)
Spoke with patient-aware that Rx refill has been sent to pharmacy. Nothing more needed at this time.

## 2014-07-31 DIAGNOSIS — H2513 Age-related nuclear cataract, bilateral: Secondary | ICD-10-CM | POA: Diagnosis not present

## 2014-08-08 ENCOUNTER — Telehealth: Payer: Self-pay | Admitting: Family

## 2014-08-08 ENCOUNTER — Ambulatory Visit (INDEPENDENT_AMBULATORY_CARE_PROVIDER_SITE_OTHER): Payer: Medicare Other

## 2014-08-08 DIAGNOSIS — Z5181 Encounter for therapeutic drug level monitoring: Secondary | ICD-10-CM | POA: Diagnosis not present

## 2014-08-08 DIAGNOSIS — I2699 Other pulmonary embolism without acute cor pulmonale: Secondary | ICD-10-CM

## 2014-08-08 LAB — POCT INR: INR: 3.4

## 2014-08-08 NOTE — Telephone Encounter (Signed)
Informed patient of coumadin instructions as outlined during his visit.  Verbalized understanding.

## 2014-08-08 NOTE — Telephone Encounter (Signed)
Agree with recommendations of holding one dose.

## 2014-08-08 NOTE — Progress Notes (Signed)
Pre visit review using our clinic review tool, if applicable. No additional management support is needed unless otherwise documented below in the visit note. 

## 2014-09-05 ENCOUNTER — Ambulatory Visit (INDEPENDENT_AMBULATORY_CARE_PROVIDER_SITE_OTHER): Payer: Medicare Other

## 2014-09-05 DIAGNOSIS — Z5181 Encounter for therapeutic drug level monitoring: Secondary | ICD-10-CM | POA: Diagnosis not present

## 2014-09-05 DIAGNOSIS — I2699 Other pulmonary embolism without acute cor pulmonale: Secondary | ICD-10-CM

## 2014-09-05 LAB — POCT INR: INR: 2

## 2014-09-06 ENCOUNTER — Emergency Department: Payer: Self-pay | Admitting: Emergency Medicine

## 2014-09-06 DIAGNOSIS — Z87891 Personal history of nicotine dependence: Secondary | ICD-10-CM | POA: Diagnosis not present

## 2014-09-06 DIAGNOSIS — H1132 Conjunctival hemorrhage, left eye: Secondary | ICD-10-CM | POA: Diagnosis not present

## 2014-09-06 DIAGNOSIS — I10 Essential (primary) hypertension: Secondary | ICD-10-CM | POA: Diagnosis not present

## 2014-09-07 LAB — PROTIME-INR
INR: 2
Prothrombin Time: 21.9 secs — ABNORMAL HIGH (ref 11.5–14.7)

## 2014-09-13 DIAGNOSIS — H1132 Conjunctival hemorrhage, left eye: Secondary | ICD-10-CM | POA: Diagnosis not present

## 2014-09-13 DIAGNOSIS — H05232 Hemorrhage of left orbit: Secondary | ICD-10-CM | POA: Diagnosis not present

## 2014-09-14 DIAGNOSIS — H1132 Conjunctival hemorrhage, left eye: Secondary | ICD-10-CM | POA: Diagnosis not present

## 2014-09-18 DIAGNOSIS — H1132 Conjunctival hemorrhage, left eye: Secondary | ICD-10-CM | POA: Diagnosis not present

## 2014-09-19 ENCOUNTER — Ambulatory Visit: Payer: Medicare Other | Admitting: Family Medicine

## 2014-09-19 DIAGNOSIS — I2699 Other pulmonary embolism without acute cor pulmonale: Secondary | ICD-10-CM | POA: Diagnosis not present

## 2014-09-19 DIAGNOSIS — Z5181 Encounter for therapeutic drug level monitoring: Secondary | ICD-10-CM | POA: Diagnosis not present

## 2014-09-19 LAB — POCT INR: INR: 2.2

## 2014-09-24 DIAGNOSIS — H1132 Conjunctival hemorrhage, left eye: Secondary | ICD-10-CM | POA: Diagnosis not present

## 2014-09-24 DIAGNOSIS — D3132 Benign neoplasm of left choroid: Secondary | ICD-10-CM | POA: Diagnosis not present

## 2014-09-24 DIAGNOSIS — H18412 Arcus senilis, left eye: Secondary | ICD-10-CM | POA: Diagnosis not present

## 2014-10-02 ENCOUNTER — Other Ambulatory Visit: Payer: Medicare Other

## 2014-10-02 ENCOUNTER — Ambulatory Visit: Payer: Medicare Other | Admitting: Internal Medicine

## 2014-10-02 ENCOUNTER — Other Ambulatory Visit: Payer: Self-pay | Admitting: Internal Medicine

## 2014-10-02 ENCOUNTER — Encounter: Payer: Self-pay | Admitting: Internal Medicine

## 2014-10-02 VITALS — BP 174/84 | HR 95 | Ht 66.0 in | Wt 175.8 lb

## 2014-10-02 DIAGNOSIS — J441 Chronic obstructive pulmonary disease with (acute) exacerbation: Secondary | ICD-10-CM

## 2014-10-02 MED ORDER — PROMETHAZINE-CODEINE 6.25-10 MG/5ML PO SYRP
5.0000 mL | ORAL_SOLUTION | Freq: Four times a day (QID) | ORAL | Status: DC | PRN
Start: 2014-10-02 — End: 2014-10-24

## 2014-10-02 MED ORDER — AMOXICILLIN-POT CLAVULANATE 875-125 MG PO TABS: 1.0000 | ORAL_TABLET | Freq: Two times a day (BID) | ORAL | Status: DC

## 2014-10-02 MED ORDER — METHYLPREDNISOLONE ACETATE 80 MG/ML IJ SUSP
80.0000 mg | Freq: Once | INTRAMUSCULAR | Status: AC
Start: 2014-10-02 — End: 2014-10-02
  Administered 2014-10-02: 80 mg via INTRAMUSCULAR

## 2014-10-02 MED ORDER — LEVALBUTEROL HCL 0.63 MG/3ML IN NEBU
0.6300 mg | INHALATION_SOLUTION | Freq: Once | RESPIRATORY_TRACT | Status: AC
  Administered 2014-10-02: 0.63 mg via RESPIRATORY_TRACT

## 2014-10-02 NOTE — Patient Instructions (Signed)
Script printed to hold for augmentin antibiotic to use if needed  Script printed for cough syrup  Neb xop 0.63  Depo 80

## 2014-10-02 NOTE — Progress Notes (Signed)
Patient ID: [name], male    DOB: April 05, 1946, 69 y.o.   MRN: [id number]  HPI 06/08/11- 69 year old male former smoker followed for COPD, history of small cell cancer, history of pneumothorax.   PCP is Dr Asencion Noble Last here December 03, 2010- Since then had routine physical w/ Dr Willey Blade- okay except for the breathing part. He did use stand-by prednisone 3 months ago on trip to [place] and would like to keep some on hand.  He is okay today, asking about pertussis vaccine and wants flu vax. Last tetanus about 3 years ago- discussed. Has had more than one pneumovax. Had shingles vaccine.  Expects wheeze and dyspnea with exertion- unchanged. Spiriva didn't help- made him worse.   12/07/11-06/08/11- 69 year old male former smoker followed for COPD, history of small cell cancer, history of pneumothorax.   PCP is Dr Asencion Noble Did well through the winter until he caught a cold last week. Coughing clear mucus. Denies fever or purulent sputum. Feels he just needs more time to clear this. Used up last standby prescription for prednisone one month ago. Discussed change of delivery device for Combivent. Advair 250 works as well as Advair 500.  10/13/13- 69 year old male former smoker followed for COPD, history of small cell cancer, history of pneumothorax.  FOLLOWS LFY:[id number] about the same since last visit; continues to use Advair with success. Prednisone alternating 10 mg with 5 mg every other day. CXR 12/14/12 IMPRESSION: Underlying emphysema. Postoperative change in the  left. Bibasilar scarring. No edema or consolidation. Essentially  stable chest appearance compared to prior study.  Original Report Authenticated By: Lowella Grip, M.D.   12/22/2013 [facility] follow up --69 year old male former smoker followed for COPD, history of small cell cancer, history of pneumothorax. Patient returns for a post hospital followup. He was admitted at [facility] on March 16 and found  to have an acute pulmonary embolus. CT chest showed pulmonary embolus involving the right middle lobe and posterior, right lower lobe pulmonary arteries. Patchy peripheral consolidation in the right middle lobe and right base, consistent with infarct. Says he developed worsening dyspnea after recent URI that prompted the ER visit. Also had a DVT in right lower leg.  Pt was also tx for possible PNA on left. Was tx w/ abx, says he finished them yesterday.  Started on Xarelto .  Records requested from [facility].  Since discharge he is feeling some better, but gets winded easily . Still weak.  Can walk on flat surfaces ok , incline makes it harder.  No chest pain, hemopytsis, leg swelling, n/v/d., orthopnea.  Remains on Advair and prednisone 5mg  alt 10mg  .   01/26/14- 69 year old male former smoker followed for COPD, history of small cell cancer, history of pneumothorax, hx DVT/PE/Xarelto FOLLOWS FOR:  CXR done today-Still having SOB with exertion, tightness in chest and some  cough with clear mucus CT chest done 12/11/2013 at [facility] noted enlarged paratracheal and hilar nodes, as well as diagnosed pulmonary emboli. Clot was in the right calf based on Doppler. He is concerned about cost of Xarelto.still notes easy dyspnea on exertion some days. He feels better on the days he takes prednisone 10 mg rather than 5, asking to stay on 10 mg daily. CXR 01/26/14 IMPRESSION:  Improved opacities throughout the left lung with volume loss.  Baseline has not been achieved yet compared with January 2015.  Continued followup is warranted.  Electronically Signed  By: Maryclare Bean M.D.  On: 01/26/2014 09:17  03/02/14-  69 year old male former smoker followed for COPD, history of small cell cancer, history of pneumothorax, hx DVT/PE/Xarelto(December 10, 2013) FOLLOWS FOR:  Breathing improved since last OV Little cough, no chest pain. Can now walk 1 mile daily w/o stopping, on room air.  Xarelto is too expensive, unsustainable.  Discussed change to coumadin. Easy bruise on xarelto but no overt bleed.   07/02/14- 69 year old male former smoker followed for COPD, history of small cell cancer, history of pneumothorax, hx DVT/PE/warfarin(December 10, 2013) FOLLOWS FOR: continues to cough up green colored mucus-recently had cold. Pt denies any wheezing, SOB, fever, or chills. Recent chest cold clearing very slowly with residual cough and some green sputum. No fever, blood or chest pain. Converted to warfarin because Xarelto was too expensive. Managed by Coumadin clinic. No bleeding.  10/02/14- 69 year old male former smoker followed for COPD, history of small cell cancer, history of pneumothorax, hx DVT/PE/warfarin(December 10, 2013) FOLLOWS FOR: Pt c/o sneezing, runny nose, wheezing and productive cough with yellow mucus at times. Denies fever. Using cough drops PRN cough. Pt reports having a recent subconjunctival hemorrhage with high BPs  CXR 03/02/14 IMPRESSION: Postsurgical changes the left hemithorax. No evidence of acute cardiopulmonary disease. Electronically Signed  By: Julian Hy M.D.  On: 03/02/2014 12:59  ROS-see HPI Constitutional:   No-   weight loss, night sweats, fevers, chills, fatigue, lassitude. HEENT:   No-  headaches, difficulty swallowing, tooth/dental problems, sore throat,       No-  sneezing, itching, ear ache, nasal congestion, post nasal drip,  CV:  No-   chest pain, orthopnea, PND, swelling in lower extremities, anasarca,                                  dizziness, palpitations Resp: + shortness of breath with exertion or at rest.              + productive cough,  No non-productive cough,  No- coughing up of blood.              No-   change in color of mucus.  No- wheezing.   Skin: No-   rash or lesions. GI:  No-   heartburn, indigestion, abdominal pain, nausea, vomiting,  GU:  MS:  No-   joint pain or swelling.   Neuro-     nothing unusual Psych:  No- change in mood or affect. No  depression or anxiety.  No memory loss.    Objective:  OBJ- Physical Exam General- Alert, Oriented, Affect-appropriate, Distress- none acute Skin- rash-none, lesions- none, excoriation- none Lymphadenopathy- none Head- atraumatic            Eyes- Gross vision intact, PERRLA, conjunctivae and secretions clear            Ears- Hearing, canals-normal            Nose- Clear, no-Septal dev, mucus, polyps, erosion, perforation             Throat- Mallampati II , mucosa clear , drainage- none, tonsils- atrophic Neck- flexible , trachea midline, no stridor , thyroid nl, carotid no bruit Chest - symmetrical excursion , unlabored           Heart/CV- RRR , no murmur , no gallop  , no rub, nl s1 s2                           -  JVD- none , edema- none, stasis changes- none, varices- none           Lung- + crackles right lateral chest/unlabored, wheeze- none, cough- none , dullness-none, rub- none           Chest wall-  Abd-  Br/ Gen/ Rectal- Not done, not indicated Extrem- +prominent superficial leg veins, superficial varices, spider varices. Some bruising. Neuro- grossly intact to observation

## 2014-10-03 ENCOUNTER — Ambulatory Visit: Payer: Medicare Other | Admitting: Family Medicine

## 2014-10-03 DIAGNOSIS — Z5181 Encounter for therapeutic drug level monitoring: Secondary | ICD-10-CM

## 2014-10-03 DIAGNOSIS — I2699 Other pulmonary embolism without acute cor pulmonale: Secondary | ICD-10-CM

## 2014-10-03 LAB — POCT INR: INR: 3

## 2014-10-10 DIAGNOSIS — H1132 Conjunctival hemorrhage, left eye: Secondary | ICD-10-CM | POA: Diagnosis not present

## 2014-10-15 ENCOUNTER — Ambulatory Visit: Payer: Medicare Other | Admitting: Internal Medicine

## 2014-10-17 ENCOUNTER — Ambulatory Visit: Payer: Medicare Other

## 2014-10-17 DIAGNOSIS — Z5181 Encounter for therapeutic drug level monitoring: Secondary | ICD-10-CM

## 2014-10-17 DIAGNOSIS — I2699 Other pulmonary embolism without acute cor pulmonale: Secondary | ICD-10-CM | POA: Diagnosis not present

## 2014-10-17 LAB — POCT INR: INR: 3

## 2014-10-17 MED ORDER — WARFARIN SODIUM 5 MG PO TABS: ORAL_TABLET | ORAL | Status: DC

## 2014-10-24 ENCOUNTER — Encounter: Payer: Self-pay | Admitting: Adult Health

## 2014-10-24 ENCOUNTER — Telehealth: Payer: Self-pay | Admitting: Internal Medicine

## 2014-10-24 ENCOUNTER — Ambulatory Visit (INDEPENDENT_AMBULATORY_CARE_PROVIDER_SITE_OTHER): Payer: Medicare Other | Admitting: Adult Health

## 2014-10-24 ENCOUNTER — Ambulatory Visit (INDEPENDENT_AMBULATORY_CARE_PROVIDER_SITE_OTHER)
Admission: RE | Admit: 2014-10-24 | Discharge: 2014-10-24 | Disposition: A | Discharge status: Home / Self Care | Payer: Medicare Other | Source: Ambulatory Visit | Attending: Adult Health | Admitting: Adult Health

## 2014-10-24 VITALS — BP 138/84 | HR 75 | Temp 98.2°F | Ht 66.0 in | Wt 170.0 lb

## 2014-10-24 DIAGNOSIS — J432 Centrilobular emphysema: Secondary | ICD-10-CM

## 2014-10-24 DIAGNOSIS — J984 Other disorders of lung: Secondary | ICD-10-CM | POA: Diagnosis not present

## 2014-10-24 DIAGNOSIS — I2699 Other pulmonary embolism without acute cor pulmonale: Secondary | ICD-10-CM | POA: Diagnosis not present

## 2014-10-24 MED ORDER — DOXYCYCLINE HYCLATE 100 MG PO TABS: 100.0000 mg | ORAL_TABLET | Freq: Two times a day (BID) | ORAL | Status: DC

## 2014-10-24 MED ORDER — PREDNISONE 10 MG PO TABS: ORAL_TABLET | ORAL | Status: DC

## 2014-10-24 MED ORDER — LEVALBUTEROL HCL 0.63 MG/3ML IN NEBU
0.6300 mg | INHALATION_SOLUTION | Freq: Once | RESPIRATORY_TRACT | Status: AC
  Administered 2014-10-24: 0.63 mg via RESPIRATORY_TRACT

## 2014-10-24 MED ORDER — HYDROCODONE-HOMATROPINE 5-1.5 MG/5ML PO SYRP: 5.0000 mL | ORAL_SOLUTION | Freq: Four times a day (QID) | ORAL | Status: DC | PRN

## 2014-10-24 NOTE — Assessment & Plan Note (Signed)
Continue on Coumadin  notify Coumadin clinic that starting new antibiotic , as may interfere with Coumadin

## 2014-10-24 NOTE — Progress Notes (Signed)
Patient ID: [name], male    DOB: [birth year], 69 y.o.   MRN: [id number]  HPI 06/08/11- 69 year old male former smoker followed for COPD, history of small cell cancer, history of pneumothorax.   PCP is Dr Asencion Noble Last here December 03, 2010- Since then had routine physical w/ Dr Willey Blade- okay except for the breathing part. He did use stand-by prednisone 3 months ago on trip to [place] and would like to keep some on hand.  He is okay today, asking about pertussis vaccine and wants flu vax. Last tetanus about 3 years ago- discussed. Has had more than one pneumovax. Had shingles vaccine.  Expects wheeze and dyspnea with exertion- unchanged. Spiriva didn't help- made him worse.   12/07/11-06/08/11- 69 year old male former smoker followed for COPD, history of small cell cancer, history of pneumothorax.   PCP is Dr Asencion Noble Did well through the winter until he caught a cold last week. Coughing clear mucus. Denies fever or purulent sputum. Feels he just needs more time to clear this. Used up last standby prescription for prednisone one month ago. Discussed change of delivery device for Combivent. Advair 250 works as well as Advair 500.  10/13/13- 69 year old male former smoker followed for COPD, history of small cell cancer, history of pneumothorax.  FOLLOWS WUJ:[id number] about the same since last visit; continues to use Advair with success. Prednisone alternating 10 mg with 5 mg every other day. CXR 12/14/12 IMPRESSION: Underlying emphysema. Postoperative change in the  left. Bibasilar scarring. No edema or consolidation. Essentially  stable chest appearance compared to prior study.  Original Report Authenticated By: Lowella Grip, M.D.   12/22/2013 [facility] follow up --69 year old male former smoker followed for COPD, history of small cell cancer, history of pneumothorax. Patient returns for a post hospital followup. He was admitted at [facility] on March 16 and found  to have an acute pulmonary embolus. CT chest showed pulmonary embolus involving the right middle lobe and posterior, right lower lobe pulmonary arteries. Patchy peripheral consolidation in the right middle lobe and right base, consistent with infarct. Says he developed worsening dyspnea after recent URI that prompted the ER visit. Also had a DVT in right lower leg.  Pt was also tx for possible PNA on left. Was tx w/ abx, says he finished them yesterday.  Started on Xarelto .  Records requested from [facility].  Since discharge he is feeling some better, but gets winded easily . Still weak.  Can walk on flat surfaces ok , incline makes it harder.  No chest pain, hemopytsis, leg swelling, n/v/d., orthopnea.  Remains on Advair and prednisone 5mg  alt 10mg  .   01/26/14- 69 year old male former smoker followed for COPD, history of small cell cancer, history of pneumothorax, hx DVT/PE/Xarelto FOLLOWS FOR:  CXR done today-Still having SOB with exertion, tightness in chest and some  cough with clear mucus CT chest done 12/11/2013 at [facility] noted enlarged paratracheal and hilar nodes, as well as diagnosed pulmonary emboli. Clot was in the right calf based on Doppler. He is concerned about cost of Xarelto.still notes easy dyspnea on exertion some days. He feels better on the days he takes prednisone 10 mg rather than 5, asking to stay on 10 mg daily. CXR 01/26/14 IMPRESSION:  Improved opacities throughout the left lung with volume loss.  Baseline has not been achieved yet compared with January 2015.  Continued followup is warranted.  Electronically Signed  By: Maryclare Bean M.D.  On: 01/26/2014 09:17  03/02/14-  69 year old male former smoker followed for COPD, history of small cell cancer, history of pneumothorax, hx DVT/PE/Xarelto(December 10, 2013) FOLLOWS FOR:  Breathing improved since last OV Little cough, no chest pain. Can now walk 1 mile daily w/o stopping, on room air.  Xarelto is too expensive, unsustainable.  Discussed change to coumadin. Easy bruise on xarelto but no overt bleed.   07/02/14- 69 year old male former smoker followed for COPD, history of small cell cancer, history of pneumothorax, hx DVT/PE/warfarin(December 10, 2013) FOLLOWS FOR: continues to cough up green colored mucus-recently had cold. Pt denies any wheezing, SOB, fever, or chills. Recent chest cold clearing very slowly with residual cough and some green sputum. No fever, blood or chest pain. Converted to warfarin because Xarelto was too expensive. Managed by Coumadin clinic. No bleeding.  10/02/14- 69 year old male former smoker followed for COPD, history of small cell cancer, history of pneumothorax, hx DVT/PE/warfarin(December 10, 2013) FOLLOWS FOR: Pt c/o sneezing, runny nose, wheezing and productive cough with yellow mucus at times. Denies fever. Using cough drops PRN cough. Pt reports having a recent subconjunctival hemorrhage with high BPs  CXR 03/02/14 IMPRESSION: Postsurgical changes the left hemithorax. No evidence of acute cardiopulmonary disease. Electronically Signed  By: Julian Hy M.D.  On: 03/02/2014 12:59  10/24/2014 69 year old male former smoker followed for COPD, history of small cell cancer, history of pneumothorax, hx DVT/PE/warfarin(December 10, 2013) Pt presents for an acute office visit.  Complains of increased SOB, wheezing, tightness in chest, prod cough with clear mucus worse x2 days.   Denies f/c/s, n/v/d, hemoptysis, chest pain, orthopnea, or leg swelling Had URI /bronchitis 3 weeks ago, took Augmentin . Felt better until this week.    ROS-see HPI Constitutional:   No  weight loss, night sweats,  Fevers, chills,  +fatigue, or  lassitude.  HEENT:   No headaches,  Difficulty swallowing,  Tooth/dental problems, or  Sore throat,                No sneezing, itching, ear ache,  +nasal congestion, post nasal drip,   CV:  No chest pain,  Orthopnea, PND, swelling in lower extremities, anasarca,  dizziness, palpitations, syncope.   GI  No heartburn, indigestion, abdominal pain, nausea, vomiting, diarrhea, change in bowel habits, loss of appetite, bloody stools.   Resp:    No chest wall deformity  Skin: no rash or lesions.  GU: no dysuria, change in color of urine, no urgency or frequency.  No flank pain, no hematuria   MS:  No joint pain or swelling.  No decreased range of motion.  No back pain.  Psych:  No change in mood or affect. No depression or anxiety.  No memory loss.        Objective:  OBJ- Physical Exam GEN: A/Ox3; pleasant , NAD, elderly   HEENT:  [place]/AT,  EACs-clear, TMs-wnl, NOSE-clear, THROAT-clear, no lesions, no postnasal drip or exudate noted.   NECK:  Supple w/ fair ROM; no JVD; normal carotid impulses w/o bruits; no thyromegaly or nodules palpated; no lymphadenopathy.  RESP  Scattered rhonchi , few trace wheezes no accessory muscle use, no dullness to percussion  CARD:  RRR, no m/r/g  , no peripheral edema, pulses intact, no cyanosis or clubbing.  GI:   Soft & nt; nml bowel sounds; no organomegaly or masses detected.  Musco: Warm bil, no deformities or joint swelling noted.   Neuro: alert, no focal deficits noted.    Skin: Warm, no lesions or rashes  CXR  10/24/2014  Independently reviewed this image  Bilateral pleural parenchymal scarring. Postsurgical changes left lung. No acute cardiopulmonary disease

## 2014-10-24 NOTE — Assessment & Plan Note (Signed)
Exacerbation Chest x-ray without any sign of pneumonia Xopenex nebulizer treatment given in the office  Plan  Doxycycline 100 mg twice daily for 7 days-take with food, eat yogurt while on antibiotic Mucinex DM twice daily as needed for cough and congestion  Prednisone taper over next week.then back to your regular daily dose.  Fluids and rest  Follow up Dr. Annamaria Boots  In 2 months and As needed   Notify coumadin clinic as you are beginning antibitoics.  Please contact office for sooner follow up if symptoms do not improve or worsen or seek emergency care

## 2014-10-24 NOTE — Addendum Note (Signed)
Addended by: Parke Poisson E on: 10/24/2014 12:59 PM   Modules accepted: Orders

## 2014-10-24 NOTE — Telephone Encounter (Signed)
Spoke with pt. There is very noticeable audible wheezing while talking to him. He has been placed on TP's schedule today at 12pm.

## 2014-10-24 NOTE — Patient Instructions (Addendum)
Doxycycline 100 mg twice daily for 7 days-take with food, eat yogurt while on antibiotic Mucinex DM twice daily as needed for cough and congestion  Prednisone taper over next week.then back to your regular daily dose.  Fluids and rest  Follow up Dr. Annamaria Boots  In 2 months and As needed   Notify coumadin clinic as you are beginning antibitoics.  Please contact office for sooner follow up if symptoms do not improve or worsen or seek emergency care

## 2014-10-28 ENCOUNTER — Other Ambulatory Visit: Payer: Self-pay | Admitting: Internal Medicine

## 2014-11-01 DIAGNOSIS — J440 Chronic obstructive pulmonary disease with acute lower respiratory infection: Secondary | ICD-10-CM | POA: Diagnosis not present

## 2014-11-01 DIAGNOSIS — I10 Essential (primary) hypertension: Secondary | ICD-10-CM | POA: Diagnosis not present

## 2014-11-07 ENCOUNTER — Ambulatory Visit (INDEPENDENT_AMBULATORY_CARE_PROVIDER_SITE_OTHER): Payer: Medicare Other | Admitting: General Practice

## 2014-11-07 DIAGNOSIS — I2699 Other pulmonary embolism without acute cor pulmonale: Secondary | ICD-10-CM

## 2014-11-07 DIAGNOSIS — Z5181 Encounter for therapeutic drug level monitoring: Secondary | ICD-10-CM

## 2014-11-07 LAB — POCT INR: INR: 2.3

## 2014-11-07 NOTE — Progress Notes (Signed)
Pre visit review using our clinic review tool, if applicable. No additional management support is needed unless otherwise documented below in the visit note. 

## 2014-11-07 NOTE — Progress Notes (Signed)
Agree with plan 

## 2014-11-20 ENCOUNTER — Emergency Department: Payer: Self-pay | Admitting: Emergency Medicine

## 2014-11-20 DIAGNOSIS — J441 Chronic obstructive pulmonary disease with (acute) exacerbation: Secondary | ICD-10-CM | POA: Diagnosis not present

## 2014-11-20 DIAGNOSIS — Z7982 Long term (current) use of aspirin: Secondary | ICD-10-CM | POA: Diagnosis not present

## 2014-11-20 DIAGNOSIS — Z7952 Long term (current) use of systemic steroids: Secondary | ICD-10-CM | POA: Diagnosis not present

## 2014-11-20 DIAGNOSIS — S99911A Unspecified injury of right ankle, initial encounter: Secondary | ICD-10-CM | POA: Diagnosis not present

## 2014-11-20 DIAGNOSIS — Z87891 Personal history of nicotine dependence: Secondary | ICD-10-CM | POA: Diagnosis not present

## 2014-11-20 DIAGNOSIS — S93401A Sprain of unspecified ligament of right ankle, initial encounter: Secondary | ICD-10-CM | POA: Diagnosis not present

## 2014-11-20 DIAGNOSIS — S93409A Sprain of unspecified ligament of unspecified ankle, initial encounter: Secondary | ICD-10-CM | POA: Diagnosis not present

## 2014-11-20 DIAGNOSIS — Z79899 Other long term (current) drug therapy: Secondary | ICD-10-CM | POA: Diagnosis not present

## 2014-11-20 DIAGNOSIS — J439 Emphysema, unspecified: Secondary | ICD-10-CM | POA: Diagnosis not present

## 2014-11-20 DIAGNOSIS — M7989 Other specified soft tissue disorders: Secondary | ICD-10-CM | POA: Diagnosis not present

## 2014-11-20 DIAGNOSIS — R0602 Shortness of breath: Secondary | ICD-10-CM | POA: Diagnosis not present

## 2014-11-20 DIAGNOSIS — M25571 Pain in right ankle and joints of right foot: Secondary | ICD-10-CM | POA: Diagnosis not present

## 2014-11-20 DIAGNOSIS — Z7901 Long term (current) use of anticoagulants: Secondary | ICD-10-CM | POA: Diagnosis not present

## 2014-11-21 ENCOUNTER — Telehealth: Payer: Self-pay | Admitting: General Practice

## 2014-11-21 ENCOUNTER — Ambulatory Visit (INDEPENDENT_AMBULATORY_CARE_PROVIDER_SITE_OTHER): Payer: Medicare Other | Admitting: Adult Health

## 2014-11-21 ENCOUNTER — Encounter: Payer: Self-pay | Admitting: Adult Health

## 2014-11-21 VITALS — BP 110/68 | HR 95 | Temp 97.9°F | Ht 67.0 in | Wt 171.6 lb

## 2014-11-21 DIAGNOSIS — J189 Pneumonia, unspecified organism: Secondary | ICD-10-CM | POA: Diagnosis not present

## 2014-11-21 DIAGNOSIS — J432 Centrilobular emphysema: Secondary | ICD-10-CM

## 2014-11-21 MED ORDER — MOMETASONE FUROATE 50 MCG/ACT NA SUSP: 2.0000 | Freq: Every day | NASAL | Status: DC

## 2014-11-21 NOTE — Telephone Encounter (Signed)
Patient taking Levaquin and Prednisone for 5 days.  Instructed patient to take 2.5 mg of coumadin through Monday.  Re-check INR on Tuesday 3/1.  Patient verbalized understanding.

## 2014-11-21 NOTE — Assessment & Plan Note (Signed)
?  left basilar PNA on ER cxr 2/23  Cont abx  follow up with serial cxr   Plan  Finish Levaquin .  Taper steroids as directed.  Saline nasal rinses As needed   Mucinex DM Twice daily  As needed  Cough/congesiton  Fluids and rest  follow up Dr. Annamaria Boots  In 2 weeks with chest xray Call if blood tinged mucus persists.  Please contact office for sooner follow up if symptoms do not improve or worsen or seek emergency care

## 2014-11-21 NOTE — Patient Instructions (Signed)
Finish Levaquin .  Taper steroids as directed.  Saline nasal rinses As needed   Mucinex DM Twice daily  As needed  Cough/congesiton  Fluids and rest  follow up Dr. Annamaria Boots  In 2 weeks with chest xray Call if blood tinged mucus persists.  Please contact office for sooner follow up if symptoms do not improve or worsen or seek emergency care

## 2014-11-21 NOTE — Assessment & Plan Note (Signed)
Recurrent flare   Plan  Finish Levaquin .  Taper steroids as directed.  Saline nasal rinses As needed   Mucinex DM Twice daily  As needed  Cough/congesiton  Fluids and rest  follow up Dr. Annamaria Boots  In 2 weeks with chest xray Call if blood tinged mucus persists.  Please contact office for sooner follow up if symptoms do not improve or worsen or seek emergency care

## 2014-11-21 NOTE — Progress Notes (Signed)
Patient ID: [name], male    DOB: 10-25-1945, 69 y.o.   MRN: [id number]  HPI 06/08/11- 69 year old male former smoker followed for COPD, history of small cell cancer, history of pneumothorax.   PCP is Dr Asencion Noble Last here December 03, 2010- Since then had routine physical w/ Dr Willey Blade- okay except for the breathing part. He did use stand-by prednisone 3 months ago on trip to [place] and would like to keep some on hand.  He is okay today, asking about pertussis vaccine and wants flu vax. Last tetanus about 3 years ago- discussed. Has had more than one pneumovax. Had shingles vaccine.  Expects wheeze and dyspnea with exertion- unchanged. Spiriva didn't help- made him worse.   12/07/11-06/08/11- 69 year old male former smoker followed for COPD, history of small cell cancer, history of pneumothorax.   PCP is Dr Asencion Noble Did well through the winter until he caught a cold last week. Coughing clear mucus. Denies fever or purulent sputum. Feels he just needs more time to clear this. Used up last standby prescription for prednisone one month ago. Discussed change of delivery device for Combivent. Advair 250 works as well as Advair 500.  10/13/13- 69 year old male former smoker followed for COPD, history of small cell cancer, history of pneumothorax.  FOLLOWS GLO:VFIEPPIRJ about the same since last visit; continues to use Advair with success. Prednisone alternating 10 mg with 5 mg every other day. CXR 12/14/12 IMPRESSION: Underlying emphysema. Postoperative change in the  left. Bibasilar scarring. No edema or consolidation. Essentially  stable chest appearance compared to prior study.  Original Report Authenticated By: Lowella Grip, M.D.   12/22/2013 [facility] follow up --69 year old male former smoker followed for COPD, history of small cell cancer, history of pneumothorax. Patient returns for a post hospital followup. He was admitted at [facility] on March 16 and found  to have an acute pulmonary embolus. CT chest showed pulmonary embolus involving the right middle lobe and posterior, right lower lobe pulmonary arteries. Patchy peripheral consolidation in the right middle lobe and right base, consistent with infarct. Says he developed worsening dyspnea after recent URI that prompted the ER visit. Also had a DVT in right lower leg.  Pt was also tx for possible PNA on left. Was tx w/ abx, says he finished them yesterday.  Started on Xarelto .  Records requested from [facility].  Since discharge he is feeling some better, but gets winded easily . Still weak.  Can walk on flat surfaces ok , incline makes it harder.  No chest pain, hemopytsis, leg swelling, n/v/d., orthopnea.  Remains on Advair and prednisone 5mg  alt 10mg  .   01/26/14- 69 year old male former smoker followed for COPD, history of small cell cancer, history of pneumothorax, hx DVT/PE/Xarelto FOLLOWS FOR:  CXR done today-Still having SOB with exertion, tightness in chest and some  cough with clear mucus CT chest done 12/11/2013 at [facility] noted enlarged paratracheal and hilar nodes, as well as diagnosed pulmonary emboli. Clot was in the right calf based on Doppler. He is concerned about cost of Xarelto.still notes easy dyspnea on exertion some days. He feels better on the days he takes prednisone 10 mg rather than 5, asking to stay on 10 mg daily. CXR 01/26/14 IMPRESSION:  Improved opacities throughout the left lung with volume loss.  Baseline has not been achieved yet compared with January 2015.  Continued followup is warranted.  Electronically Signed  By: Maryclare Bean M.D.  On: 01/26/2014 09:17  03/02/14-  69 year old male former smoker followed for COPD, history of small cell cancer, history of pneumothorax, hx DVT/PE/Xarelto(December 10, 2013) FOLLOWS FOR:  Breathing improved since last OV Little cough, no chest pain. Can now walk 1 mile daily w/o stopping, on room air.  Xarelto is too expensive, unsustainable.  Discussed change to coumadin. Easy bruise on xarelto but no overt bleed.   07/02/14- 69 year old male former smoker followed for COPD, history of small cell cancer, history of pneumothorax, hx DVT/PE/warfarin(December 10, 2013) FOLLOWS FOR: continues to cough up green colored mucus-recently had cold. Pt denies any wheezing, SOB, fever, or chills. Recent chest cold clearing very slowly with residual cough and some green sputum. No fever, blood or chest pain. Converted to warfarin because Xarelto was too expensive. Managed by Coumadin clinic. No bleeding.  10/02/14- 69 year old male former smoker followed for COPD, history of small cell cancer, history of pneumothorax, hx DVT/PE/warfarin(December 10, 2013) FOLLOWS FOR: Pt c/o sneezing, runny nose, wheezing and productive cough with yellow mucus at times. Denies fever. Using cough drops PRN cough. Pt reports having a recent subconjunctival hemorrhage with high BPs  CXR 03/02/14 IMPRESSION: Postsurgical changes the left hemithorax. No evidence of acute cardiopulmonary disease. Electronically Signed  By: Julian Hy M.D.  On: 03/02/2014 12:59  10/24/2014 69 year old male former smoker followed for COPD, history of small cell cancer, history of pneumothorax, hx DVT/PE/warfarin(December 10, 2013) Pt presents for an acute office visit.  Complains of increased SOB, wheezing, tightness in chest, prod cough with clear mucus worse x2 days.   Denies f/c/s, n/v/d, hemoptysis, chest pain, orthopnea, or leg swelling Had URI /bronchitis 3 weeks ago, took Augmentin . Felt better until this week.  >>doxycycline /pred taper   11/21/2014 ER follow up : 69 year old male former smoker followed for COPD, history of small cell cancer s/p resection (2000) , history of pneumothorax, hx DVT/PE/warfarin(December 10, 2013) Complains of 1 week with productive cough with blood tinged mucus. No frank hemoptysis .  Thick mucus, dypspnea and wheezing.  Seen 1 month ago with COPD  flare , tx w/ abx and steroids .  CXR 10/24/14 showed chronic changes.  On coumadin for PE -INR 2/23 2.2  CXR in ER at [facility] noted a PNA yesterday, report says ?Left basilar infiltrate.  started on Levaquin 750mg  daily and pred taper.  Feels weak. No dizziness , chest pain, n/vd/. No hemoptysis or blood tinged mucus today.  CT chest 01/2014 with COPD changes , stable nodules , improved aeration.  Appetite is good.      ROS-see HPI Constitutional:   No  weight loss, night sweats,  Fevers, chills,  +fatigue, or  lassitude.  HEENT:   No headaches,  Difficulty swallowing,  Tooth/dental problems, or  Sore throat,                No sneezing, itching, ear ache,  +nasal congestion, post nasal drip,   CV:  No chest pain,  Orthopnea, PND, swelling in lower extremities, anasarca, dizziness, palpitations, syncope.   GI  No heartburn, indigestion, abdominal pain, nausea, vomiting, diarrhea, change in bowel habits, loss of appetite, bloody stools.   Resp:    No chest wall deformity  Skin: no rash or lesions.  GU: no dysuria, change in color of urine, no urgency or frequency.  No flank pain, no hematuria   MS:  No joint pain or swelling.  No decreased range of motion.  No back pain.  Psych:  No change in mood or affect. No  depression or anxiety.  No memory loss.        Objective:  OBJ- Physical Exam GEN: A/Ox3; pleasant , NAD, elderly   HEENT:  [place]/AT,  EACs-clear, TMs-wnl, NOSE-clear, THROAT-clear, no lesions, no postnasal drip or exudate noted.   NECK:  Supple w/ fair ROM; no JVD; normal carotid impulses w/o bruits; no thyromegaly or nodules palpated; no lymphadenopathy.  RESP  Scattered rhonchi, no wheezing   CARD:  RRR, no m/r/g  , no peripheral edema, pulses intact, no cyanosis or clubbing.  GI:   Soft & nt; nml bowel sounds; no organomegaly or masses detected.  Musco: Warm bil, no deformities or joint swelling noted.   Neuro: alert, no focal deficits noted.    Skin: Warm,  no lesions or rashes    CXR 10/24/2014  Independently reviewed this image  Bilateral pleural parenchymal scarring. Postsurgical changes left lung. No acute cardiopulmonary disease  Report CXR 11/20/14 [facility]  ?left basilar infiltrate

## 2014-11-21 NOTE — Addendum Note (Signed)
Addended by: Oscar La R on: 11/21/2014 12:05 PM   Modules accepted: Orders

## 2014-11-27 ENCOUNTER — Ambulatory Visit (INDEPENDENT_AMBULATORY_CARE_PROVIDER_SITE_OTHER): Payer: Medicare Other | Admitting: General Practice

## 2014-11-27 DIAGNOSIS — I2699 Other pulmonary embolism without acute cor pulmonale: Secondary | ICD-10-CM

## 2014-11-27 DIAGNOSIS — Z5181 Encounter for therapeutic drug level monitoring: Secondary | ICD-10-CM | POA: Diagnosis not present

## 2014-11-27 LAB — POCT INR: INR: 1.3

## 2014-11-27 NOTE — Progress Notes (Signed)
Agree with plan 

## 2014-11-27 NOTE — Progress Notes (Signed)
Pre visit review using our clinic review tool, if applicable. No additional management support is needed unless otherwise documented below in the visit note. 

## 2014-12-05 ENCOUNTER — Ambulatory Visit: Payer: Medicare Other

## 2014-12-05 LAB — POCT INR: INR: 2

## 2014-12-12 ENCOUNTER — Other Ambulatory Visit: Payer: Self-pay | Admitting: Internal Medicine

## 2014-12-12 ENCOUNTER — Ambulatory Visit (INDEPENDENT_AMBULATORY_CARE_PROVIDER_SITE_OTHER): Payer: Medicare Other | Admitting: General Practice

## 2014-12-12 ENCOUNTER — Ambulatory Visit (INDEPENDENT_AMBULATORY_CARE_PROVIDER_SITE_OTHER): Payer: Medicare Other | Admitting: Internal Medicine

## 2014-12-12 ENCOUNTER — Ambulatory Visit (INDEPENDENT_AMBULATORY_CARE_PROVIDER_SITE_OTHER)
Admission: RE | Admit: 2014-12-12 | Discharge: 2014-12-12 | Disposition: A | Discharge status: Home / Self Care | Payer: Medicare Other | Source: Ambulatory Visit | Attending: Internal Medicine | Admitting: Internal Medicine

## 2014-12-12 ENCOUNTER — Encounter: Payer: Self-pay | Admitting: Internal Medicine

## 2014-12-12 VITALS — BP 114/62 | HR 88 | Ht 67.0 in | Wt 172.4 lb

## 2014-12-12 DIAGNOSIS — J441 Chronic obstructive pulmonary disease with (acute) exacerbation: Secondary | ICD-10-CM

## 2014-12-12 DIAGNOSIS — Z5181 Encounter for therapeutic drug level monitoring: Secondary | ICD-10-CM

## 2014-12-12 DIAGNOSIS — J189 Pneumonia, unspecified organism: Secondary | ICD-10-CM

## 2014-12-12 DIAGNOSIS — J432 Centrilobular emphysema: Secondary | ICD-10-CM

## 2014-12-12 DIAGNOSIS — J45909 Unspecified asthma, uncomplicated: Secondary | ICD-10-CM | POA: Diagnosis not present

## 2014-12-12 DIAGNOSIS — R0602 Shortness of breath: Secondary | ICD-10-CM | POA: Diagnosis not present

## 2014-12-12 DIAGNOSIS — J449 Chronic obstructive pulmonary disease, unspecified: Secondary | ICD-10-CM | POA: Diagnosis not present

## 2014-12-12 DIAGNOSIS — I2699 Other pulmonary embolism without acute cor pulmonale: Secondary | ICD-10-CM

## 2014-12-12 MED ORDER — LEVALBUTEROL HCL 0.63 MG/3ML IN NEBU
0.6300 mg | INHALATION_SOLUTION | Freq: Once | RESPIRATORY_TRACT | Status: AC
  Administered 2014-12-12: 0.63 mg via RESPIRATORY_TRACT

## 2014-12-12 NOTE — Progress Notes (Signed)
Patient ID: [name], male    DOB: [birth year], 69 y.o.   MRN: [id number]  HPI 06/08/11- 69 year old male former smoker followed for COPD, history of small cell cancer, history of pneumothorax.   PCP is Dr Asencion Noble Last here December 03, 2010- Since then had routine physical w/ Dr Willey Blade- okay except for the breathing part. He did use stand-by prednisone 3 months ago on trip to [place] and would like to keep some on hand.  He is okay today, asking about pertussis vaccine and wants flu vax. Last tetanus about 3 years ago- discussed. Has had more than one pneumovax. Had shingles vaccine.  Expects wheeze and dyspnea with exertion- unchanged. Spiriva didn't help- made him worse.   12/07/11-06/08/11- 69 year old male former smoker followed for COPD, history of small cell cancer, history of pneumothorax.   PCP is Dr Asencion Noble Did well through the winter until he caught a cold last week. Coughing clear mucus. Denies fever or purulent sputum. Feels he just needs more time to clear this. Used up last standby prescription for prednisone one month ago. Discussed change of delivery device for Combivent. Advair 250 works as well as Advair 500.  10/13/13- 69 year old male former smoker followed for COPD, history of small cell cancer, history of pneumothorax.  FOLLOWS IWL:[id number] about the same since last visit; continues to use Advair with success. Prednisone alternating 10 mg with 5 mg every other day. CXR 12/14/12 IMPRESSION: Underlying emphysema. Postoperative change in the  left. Bibasilar scarring. No edema or consolidation. Essentially  stable chest appearance compared to prior study.  Original Report Authenticated By: Lowella Grip, M.D.   12/22/2013 [facility] follow up --69 year old male former smoker followed for COPD, history of small cell cancer, history of pneumothorax. Patient returns for a post hospital followup. He was admitted at [facility] on March 16 and found  to have an acute pulmonary embolus. CT chest showed pulmonary embolus involving the right middle lobe and posterior, right lower lobe pulmonary arteries. Patchy peripheral consolidation in the right middle lobe and right base, consistent with infarct. Says he developed worsening dyspnea after recent URI that prompted the ER visit. Also had a DVT in right lower leg.  Pt was also tx for possible PNA on left. Was tx w/ abx, says he finished them yesterday.  Started on Xarelto .  Records requested from [facility].  Since discharge he is feeling some better, but gets winded easily . Still weak.  Can walk on flat surfaces ok , incline makes it harder.  No chest pain, hemopytsis, leg swelling, n/v/d., orthopnea.  Remains on Advair and prednisone 5mg  alt 10mg  .   01/26/14- 69 year old male former smoker followed for COPD, history of small cell cancer, history of pneumothorax, hx DVT/PE/Xarelto FOLLOWS FOR:  CXR done today-Still having SOB with exertion, tightness in chest and some  cough with clear mucus CT chest done 12/11/2013 at [facility] noted enlarged paratracheal and hilar nodes, as well as diagnosed pulmonary emboli. Clot was in the right calf based on Doppler. He is concerned about cost of Xarelto.still notes easy dyspnea on exertion some days. He feels better on the days he takes prednisone 10 mg rather than 5, asking to stay on 10 mg daily. CXR 01/26/14 IMPRESSION:  Improved opacities throughout the left lung with volume loss.  Baseline has not been achieved yet compared with January 2015.  Continued followup is warranted.  Electronically Signed  By: Maryclare Bean M.D.  On: 01/26/2014 09:17  03/02/14-  69 year old male former smoker followed for COPD, history of small cell cancer, history of pneumothorax, hx DVT/PE/Xarelto(December 10, 2013) FOLLOWS FOR:  Breathing improved since last OV Little cough, no chest pain. Can now walk 1 mile daily w/o stopping, on room air.  Xarelto is too expensive, unsustainable.  Discussed change to coumadin. Easy bruise on xarelto but no overt bleed.   07/02/14- 69 year old male former smoker followed for COPD, history of small cell cancer, history of pneumothorax, hx DVT/PE/warfarin(December 10, 2013) FOLLOWS FOR: continues to cough up green colored mucus-recently had cold. Pt denies any wheezing, SOB, fever, or chills. Recent chest cold clearing very slowly with residual cough and some green sputum. No fever, blood or chest pain. Converted to warfarin because Xarelto was too expensive. Managed by Coumadin clinic. No bleeding.  10/02/14- 69 year old male former smoker followed for COPD, history of small cell cancer, history of pneumothorax, hx DVT/PE/warfarin(December 10, 2013) FOLLOWS FOR: Pt c/o sneezing, runny nose, wheezing and productive cough with yellow mucus at times. Denies fever. Using cough drops PRN cough. Pt reports having a recent subconjunctival hemorrhage with high BPs  CXR 03/02/14 IMPRESSION: Postsurgical changes the left hemithorax. No evidence of acute cardiopulmonary disease. Electronically Signed  By: Julian Hy M.D.  On: 03/02/2014 12:59  10/24/2014 69 year old male former smoker followed for COPD, history of small cell cancer, history of pneumothorax, hx DVT/PE/warfarin(December 10, 2013) Pt presents for an acute office visit.  Complains of increased SOB, wheezing, tightness in chest, prod cough with clear mucus worse x2 days.   Denies f/c/s, n/v/d, hemoptysis, chest pain, orthopnea, or leg swelling Had URI /bronchitis 3 weeks ago, took Augmentin . Felt better until this week.  >>doxycycline /pred taper   11/21/2014 ER follow up : 69 year old male former smoker followed for COPD, history of small cell cancer s/p resection (2000) , history of pneumothorax, hx DVT/PE/warfarin(December 10, 2013) Complains of 1 week with productive cough with blood tinged mucus. No frank hemoptysis .  Thick mucus, dypspnea and wheezing.  Seen 1 month ago with COPD  flare , tx w/ abx and steroids .  CXR 10/24/14 showed chronic changes.  On coumadin for PE -INR 2/23 2.2  CXR in ER at [facility] noted a PNA yesterday, report says ?Left basilar infiltrate.  started on Levaquin 750mg  daily and pred taper.  Feels weak. No dizziness , chest pain, n/vd/. No hemoptysis or blood tinged mucus today.  CT chest 01/2014 with COPD changes , stable nodules , improved aeration.  Appetite is good. CXR 10/24/2014  Independently reviewed this image Bilateral pleural parenchymal scarring. Postsurgical changes left lung. No acute cardiopulmonary disease Report CXR 11/20/14 [facility]  ?left basilar infiltrate   12/12/14-69 year old male former smoker followed for COPD, history of small cell cancer, history of pneumothorax, hx DVT/PE/warfarin(December 10, 2013) FOLLOWS FOR: follow up per TP due to PNA-CXR done today as well. Pt continues to have SOB as before but starting to feel slightly better. Coughing clear phlegm. On prednisone 5 mg twice daily, his feet have begun to swell. CXR 12/12/14 (today) IMPRESSION: COPD changes with scattered scarring bilaterally greater on LEFT. No definite acute abnormalities. Electronically Signed By: Lavonia Dana M.D. On: 12/12/2014 10:49  ROS-see HPI   Negative unless "+" Constitutional:    weight loss, night sweats, fevers, chills, fatigue, lassitude. HEENT:    headaches, difficulty swallowing, tooth/dental problems, sore throat,       sneezing, itching, ear ache, nasal congestion, post nasal drip, snoring CV:    chest pain, orthopnea,  PND, swelling in lower extremities, anasarca,                                  dizziness, palpitations Resp:   +shortness of breath with exertion or at rest.                +productive cough,   non-productive cough, coughing up of blood.              change in color of mucus.  wheezing.   Skin:    rash or lesions. GI:  No-   heartburn, indigestion, abdominal pain, nausea, vomiting,  GU: . MS:   joint pain,  stiffness,  Neuro-     nothing unusual Psych:  change in mood or affect.  depression or anxiety.   memory loss.  OBJ- Physical Exam General- Alert, Oriented, Affect-appropriate, Distress- none acute Skin- rash-none, lesions- none, excoriation- none Lymphadenopathy- none Head- atraumatic            Eyes- Gross vision intact, PERRLA, conjunctivae and secretions clear            Ears- Hearing, canals-normal            Nose- Clear, no-Septal dev, mucus, polyps, erosion, perforation             Throat- Mallampati II , mucosa clear , drainage- none, tonsils- atrophic Neck- flexible , trachea midline, no stridor , thyroid nl, carotid no bruit Chest - symmetrical excursion , unlabored           Heart/CV- RRR , no murmur , no gallop  , no rub, nl s1 s2                           - JVD- none , edema+1, stasis changes- none, varices- none           Lung- + raspy breath sounds, wheeze- none, cough + light, dullness-none, rub- none           Chest wall-  Abd-  Br/ Gen/ Rectal- Not done, not indicated Extrem- cyanosis- none, clubbing, none, atrophy- none, strength- nl Neuro- grossly intact to observation

## 2014-12-12 NOTE — Progress Notes (Signed)
Pre visit review using our clinic review tool, if applicable. No additional management support is needed unless otherwise documented below in the visit note. 

## 2014-12-12 NOTE — Progress Notes (Signed)
Agree with plan 

## 2014-12-12 NOTE — Patient Instructions (Addendum)
Neb xop 0.63  Sample x 1 or 2 Advair 500    1 puff then rinse mouth,k twice daily. When you run out of the sample go back to Advair 250.  Keep appointment on March 30 unless you need to change

## 2014-12-16 ENCOUNTER — Encounter: Payer: Self-pay | Admitting: Internal Medicine

## 2014-12-16 NOTE — Assessment & Plan Note (Signed)
Acute exacerbation now without obvious bacterial infection. He is beginning to retain some fluid while on prednisone. Plan-nebulizer treatments Xopenex today, sample Advair 500, then return to Advair 250/50, At next visit we will discuss reducing steroid. We discussed diuretics today.

## 2014-12-26 ENCOUNTER — Other Ambulatory Visit (INDEPENDENT_AMBULATORY_CARE_PROVIDER_SITE_OTHER): Payer: Medicare Other

## 2014-12-26 ENCOUNTER — Encounter: Payer: Self-pay | Admitting: Internal Medicine

## 2014-12-26 ENCOUNTER — Ambulatory Visit (INDEPENDENT_AMBULATORY_CARE_PROVIDER_SITE_OTHER): Payer: Medicare Other | Admitting: Internal Medicine

## 2014-12-26 VITALS — BP 118/66 | HR 88 | Ht 67.0 in | Wt 176.4 lb

## 2014-12-26 DIAGNOSIS — J432 Centrilobular emphysema: Secondary | ICD-10-CM | POA: Diagnosis not present

## 2014-12-26 DIAGNOSIS — J441 Chronic obstructive pulmonary disease with (acute) exacerbation: Secondary | ICD-10-CM | POA: Diagnosis not present

## 2014-12-26 DIAGNOSIS — R609 Edema, unspecified: Secondary | ICD-10-CM

## 2014-12-26 DIAGNOSIS — C3412 Malignant neoplasm of upper lobe, left bronchus or lung: Secondary | ICD-10-CM

## 2014-12-26 DIAGNOSIS — I2699 Other pulmonary embolism without acute cor pulmonale: Secondary | ICD-10-CM

## 2014-12-26 LAB — BASIC METABOLIC PANEL
BUN: 14 mg/dL (ref 6–23)
CO2: 27 mEq/L (ref 19–32)
Calcium: 9.4 mg/dL (ref 8.4–10.5)
Chloride: 103 mEq/L (ref 96–112)
Creatinine, Ser: 0.86 mg/dL (ref 0.40–1.50)
GFR: 93.99 mL/min (ref >60.00)
Glucose, Bld: 131 mg/dL — ABNORMAL HIGH (ref 70–99)
Potassium: 4.9 mEq/L (ref 3.5–5.1)
SODIUM: 136 meq/L (ref 135–145)

## 2014-12-26 LAB — PROTIME-INR
INR: 1.8 ratio — AB (ref 0.8–1.0)
PROTHROMBIN TIME: 19.9 s — AB (ref 9.6–13.1)

## 2014-12-26 MED ORDER — PREDNISONE 5 MG PO TABS: ORAL_TABLET | ORAL | Status: DC

## 2014-12-26 MED ORDER — ALBUTEROL SULFATE HFA 108 (90 BASE) MCG/ACT IN AERS: INHALATION_SPRAY | RESPIRATORY_TRACT | Status: DC

## 2014-12-26 MED ORDER — FUROSEMIDE 20 MG PO TABS: 20.0000 mg | ORAL_TABLET | Freq: Every day | ORAL | Status: DC

## 2014-12-26 NOTE — Patient Instructions (Signed)
Script sent for lasix/ furosemide diuretic   Take 2 daily x 2 days, then one daily x 7 days, then 1 daily as needed  Reduce prednisone to 5 mg once daily  Order lab- BMET    Dx acute exacerbation of COPD, peripheral edema

## 2014-12-26 NOTE — Progress Notes (Signed)
Patient ID: [name], male    DOB: 1946-06-26, 69 y.o.   MRN: [id number]  HPI 06/08/11- 69 year old male former smoker followed for COPD, history of small cell cancer, history of pneumothorax.   PCP is Dr Asencion Noble Last here December 03, 2010- Since then had routine physical w/ Dr Willey Blade- okay except for the breathing part. He did use stand-by prednisone 3 months ago on trip to [place] and would like to keep some on hand.  He is okay today, asking about pertussis vaccine and wants flu vax. Last tetanus about 3 years ago- discussed. Has had more than one pneumovax. Had shingles vaccine.  Expects wheeze and dyspnea with exertion- unchanged. Spiriva didn't help- made him worse.   12/07/11-06/08/11- 69 year old male former smoker followed for COPD, history of small cell cancer, history of pneumothorax.   PCP is Dr Asencion Noble Did well through the winter until he caught a cold last week. Coughing clear mucus. Denies fever or purulent sputum. Feels he just needs more time to clear this. Used up last standby prescription for prednisone one month ago. Discussed change of delivery device for Combivent. Advair 250 works as well as Advair 500.  10/13/13- 69 year old male former smoker followed for COPD, history of small cell cancer, history of pneumothorax.  FOLLOWS GUY:[id number] about the same since last visit; continues to use Advair with success. Prednisone alternating 10 mg with 5 mg every other day. CXR 12/14/12 IMPRESSION: Underlying emphysema. Postoperative change in the  left. Bibasilar scarring. No edema or consolidation. Essentially  stable chest appearance compared to prior study.  Original Report Authenticated By: Lowella Grip, M.D.   12/22/2013 [facility] follow up --69 year old male former smoker followed for COPD, history of small cell cancer, history of pneumothorax. Patient returns for a post hospital followup. He was admitted at [facility] on March 16 and found  to have an acute pulmonary embolus. CT chest showed pulmonary embolus involving the right middle lobe and posterior, right lower lobe pulmonary arteries. Patchy peripheral consolidation in the right middle lobe and right base, consistent with infarct. Says he developed worsening dyspnea after recent URI that prompted the ER visit. Also had a DVT in right lower leg.  Pt was also tx for possible PNA on left. Was tx w/ abx, says he finished them yesterday.  Started on Xarelto .  Records requested from [facility]  Since discharge he is feeling some better, but gets winded easily . Still weak.  Can walk on flat surfaces ok , incline makes it harder.  No chest pain, hemopytsis, leg swelling, n/v/d., orthopnea.  Remains on Advair and prednisone 5mg  alt 10mg  .   01/26/14- 69 year old male former smoker followed for COPD, history of small cell cancer, history of pneumothorax, hx DVT/PE/Xarelto FOLLOWS FOR:  CXR done today-Still having SOB with exertion, tightness in chest and some  cough with clear mucus CT chest done 12/11/2013 at [facility] noted enlarged paratracheal and hilar nodes, as well as diagnosed pulmonary emboli. Clot was in the right calf based on Doppler. He is concerned about cost of Xarelto.still notes easy dyspnea on exertion some days. He feels better on the days he takes prednisone 10 mg rather than 5, asking to stay on 10 mg daily. CXR 01/26/14 IMPRESSION:  Improved opacities throughout the left lung with volume loss.  Baseline has not been achieved yet compared with January 2015.  Continued followup is warranted.  Electronically Signed  By: Maryclare Bean M.D.  On: 01/26/2014 09:17  03/02/14-  69 year old male former smoker followed for COPD, history of small cell cancer, history of pneumothorax, hx DVT/PE/Xarelto(December 10, 2013) FOLLOWS FOR:  Breathing improved since last OV Little cough, no chest pain. Can now walk 1 mile daily w/o stopping, on room air.  Xarelto is too expensive, unsustainable.  Discussed change to coumadin. Easy bruise on xarelto but no overt bleed.   07/02/14- 69 year old male former smoker followed for COPD, history of small cell cancer, history of pneumothorax, hx DVT/PE/warfarin(December 10, 2013) FOLLOWS FOR: continues to cough up green colored mucus-recently had cold. Pt denies any wheezing, SOB, fever, or chills. Recent chest cold clearing very slowly with residual cough and some green sputum. No fever, blood or chest pain. Converted to warfarin because Xarelto was too expensive. Managed by Coumadin clinic. No bleeding.  10/02/14- 69 year old male former smoker followed for COPD, history of small cell cancer, history of pneumothorax, hx DVT/PE/warfarin(December 10, 2013) FOLLOWS FOR: Pt c/o sneezing, runny nose, wheezing and productive cough with yellow mucus at times. Denies fever. Using cough drops PRN cough. Pt reports having a recent subconjunctival hemorrhage with high BPs  CXR 03/02/14 IMPRESSION: Postsurgical changes the left hemithorax. No evidence of acute cardiopulmonary disease. Electronically Signed  By: Julian Hy M.D.  On: 03/02/2014 12:59  10/24/2014 69 year old male former smoker followed for COPD, history of small cell cancer, history of pneumothorax, hx DVT/PE/warfarin(December 10, 2013) Pt presents for an acute office visit.  Complains of increased SOB, wheezing, tightness in chest, prod cough with clear mucus worse x2 days.   Denies f/c/s, n/v/d, hemoptysis, chest pain, orthopnea, or leg swelling Had URI /bronchitis 3 weeks ago, took Augmentin . Felt better until this week.  >>doxycycline /pred taper   11/21/2014 ER follow up : 69 year old male former smoker followed for COPD, history of small cell cancer s/p resection (2000) , history of pneumothorax, hx DVT/PE/warfarin(December 10, 2013) Complains of 1 week with productive cough with blood tinged mucus. No frank hemoptysis .  Thick mucus, dypspnea and wheezing.  Seen 1 month ago with COPD  flare , tx w/ abx and steroids .  CXR 10/24/14 showed chronic changes.  On coumadin for PE -INR 2/23 2.2  CXR in ER at [facility] noted a PNA yesterday, report says ?Left basilar infiltrate.  started on Levaquin 750mg  daily and pred taper.  Feels weak. No dizziness , chest pain, n/vd/. No hemoptysis or blood tinged mucus today.  CT chest 01/2014 with COPD changes , stable nodules , improved aeration.  Appetite is good. CXR 10/24/2014  Independently reviewed this image Bilateral pleural parenchymal scarring. Postsurgical changes left lung. No acute cardiopulmonary disease Report CXR 11/20/14 [facility]  ?left basilar infiltrate   12/12/14-69 year old male former smoker followed for COPD, history of small cell cancer, history of pneumothorax, hx DVT/PE/warfarin(December 10, 2013) FOLLOWS FOR: follow up per TP due to PNA-CXR done today as well. Pt continues to have SOB as before but starting to feel slightly better. Coughing clear phlegm. On prednisone 5 mg twice daily, his feet have begun to swell. CXR 12/12/14 (today) IMPRESSION: COPD changes with scattered scarring bilaterally greater on LEFT. No definite acute abnormalities. Electronically Signed By: Lavonia Dana M.D. On: 12/12/2014 10:49  12/26/14- 69 year old male former smoker followed for COPD, history of small cell cancer, history of pneumothorax, hx DVT/PE/warfarin(December 10, 2013) FOLLOWS FOR: fullness in chest and head; swelling in feet. Could not tell a big difference with using Advair 500 vs Advair 250. Cough productive yellow. Still feels he is retaining fluid. Continues  prednisone 5 mg twice daily.  ROS-see HPI   Negative unless "+" Constitutional:    weight loss, night sweats, fevers, chills, fatigue, lassitude. HEENT:    headaches, difficulty swallowing, tooth/dental problems, sore throat,       sneezing, itching, ear ache, +nasal congestion, post nasal drip, snoring CV:    chest pain, orthopnea, PND, +swelling in lower extremities,  anasarca,                                  dizziness, palpitations Resp:   +shortness of breath with exertion or at rest.                +productive cough,   non-productive cough, coughing up of blood.              change in color of mucus.  wheezing.   Skin:    rash or lesions. GI:  No-   heartburn, indigestion, abdominal pain, nausea, vomiting,  GU: . MS:   joint pain, stiffness,  Neuro-     nothing unusual Psych:  change in mood or affect.  depression or anxiety.   memory loss.  OBJ- Physical Exam General- Alert, Oriented, Affect-appropriate, Distress- none acute Skin- rash-none, lesions- none, excoriation- none Lymphadenopathy- none Head- atraumatic            Eyes- Gross vision intact, PERRLA, conjunctivae and secretions clear            Ears- Hearing, canals-normal            Nose- Clear, no-Septal dev, mucus, polyps, erosion, perforation             Throat- Mallampati II , mucosa clear , drainage- none, tonsils- atrophic Neck- flexible , trachea midline, no stridor , thyroid nl, carotid no bruit Chest - symmetrical excursion , unlabored           Heart/CV- RRR , no murmur , no gallop  , no rub, nl s1 s2                           - JVD- none , edema+2-3, stasis changes- none, varices- none           Lung- + raspy breath sounds, wheeze- none, cough + light, dullness-none, rub- none           Chest wall-  Abd-  Br/ Gen/ Rectal- Not done, not indicated Extrem- cyanosis- none, clubbing, none, atrophy- none, strength- nl Neuro- grossly intact to observation

## 2014-12-30 DIAGNOSIS — R609 Edema, unspecified: Secondary | ICD-10-CM | POA: Insufficient documentation

## 2014-12-30 DIAGNOSIS — R600 Localized edema: Secondary | ICD-10-CM | POA: Insufficient documentation

## 2014-12-30 NOTE — Assessment & Plan Note (Signed)
Remains in long-term remission with risk for recurrence or new primary

## 2014-12-30 NOTE — Assessment & Plan Note (Signed)
Bronchitis exacerbation Plan-discussed medication options. We are going to try reducing prednisone to 5 mg once daily if possible.

## 2014-12-30 NOTE — Assessment & Plan Note (Signed)
Probably multifactorial with bilateral edema, negative Homans. Cor pulmonale, peripheral venous insufficiency and left heart failure may contribute. We will reduce prednisone to see if that reduces fluid retention and check renal function. Plan-reduce prednisone to 5 mg daily, chemistry, lasix 20

## 2015-01-09 ENCOUNTER — Ambulatory Visit (INDEPENDENT_AMBULATORY_CARE_PROVIDER_SITE_OTHER): Payer: Medicare Other | Admitting: General Practice

## 2015-01-09 DIAGNOSIS — Z5181 Encounter for therapeutic drug level monitoring: Secondary | ICD-10-CM | POA: Diagnosis not present

## 2015-01-09 DIAGNOSIS — I2699 Other pulmonary embolism without acute cor pulmonale: Secondary | ICD-10-CM

## 2015-01-09 LAB — POCT INR: INR: 1.8

## 2015-01-09 NOTE — Progress Notes (Signed)
Pre visit review using our clinic review tool, if applicable. No additional management support is needed unless otherwise documented below in the visit note. 

## 2015-01-09 NOTE — Progress Notes (Signed)
Agree with plan 

## 2015-01-15 NOTE — Consult Note (Signed)
Brief Consult Note: Diagnosis: NSTEMI.   Patient was seen by consultant.   Consult note dictated.   Discussed with Attending MD.   Comments: Recommend cardiac cath today if schedule allows.  Electronic Signatures: Kathlyn Sacramento (MD)  (Signed 13-Aug-13 11:55)  Authored: Brief Consult Note   Last Updated: 13-Aug-13 11:55 by Kathlyn Sacramento (MD)

## 2015-01-15 NOTE — H&P (Signed)
PATIENT NAME:  [name], [name] MR#:  [id number] DATE OF BIRTH:  05/14/1946  DATE OF ADMISSION:  05/09/2012   PRIMARY CARE PHYSICIAN: Dr. Willey Blade in [place]   REFERRING PHYSICIAN: Dr. Jasmine December   CHIEF COMPLAINT: Shortness of breath.   HISTORY OF PRESENT ILLNESS: The patient is a 69 year old male with a known history of lung cancer and COPD who is being admitted for COPD exacerbation. The patient was taking his vitamin pill this morning when he felt pill being choked in his throat and could not breathe. EMS was called and his oxygen level was found to be in the 70's. He was cold and clammy. He was placed on 100% nonrebreather mask and after a few minutes he started feeling somewhat better but he started having trouble breathing and was brought into the Emergency Department. While in the ED, he was found to have a troponin of 0.29 and he is being admitted for further evaluation and management.   PAST MEDICAL HISTORY:  1. Lung cancer, status post lobectomy on the right.  2. Collapsed lung three times.  3. History of asthma.  4. Pneumothorax.  5. Chronic obstructive pulmonary disease.   ALLERGIES: No known drug allergies.   SOCIAL HISTORY: Former smoker, quit since his lung surgery. No alcohol or drug use.   FAMILY HISTORY: Mother with a history of stroke and MI at the age of 94. Father had colon cancer.   MEDICATIONS AT HOME:  1. Tramadol 50 mg 2 tablets p.o. 4 times a day.  2. Prednisone 40 mg daily, 10 mg daily taper.  3. Multivitamin once daily.  4. Combivent as needed.  5. Advair 250/50 1 puff b.i.d.   REVIEW OF SYSTEMS: CONSTITUTIONAL: No fever, fatigue, weakness. EYES: No blurred or double vision. ENT: No tinnitus or ear pain. RESPIRATORY: No cough. Positive for wheezing and shortness of breath. CARDIOVASCULAR: No chest pain, orthopnea, or edema. GI: No nausea, vomiting, or diarrhea. GU: No dysuria or hematuria. ENDOCRINE: No polyuria or nocturia. HEMATOLOGY: No anemia or easy bruising.  History of lung cancer. SKIN: No rash or lesion. MUSCULOSKELETAL: No arthritis or muscle cramp. NEUROLOGIC: No tingling, numbness, or weakness. PSYCHIATRIC: No history of anxiety or depression.   PHYSICAL EXAMINATION:   VITAL SIGNS: Temperature 98, heart rate 99 per minute, respirations 24 per minute, blood pressure 157/83 mmHg. He was saturating 98% on 2 liters oxygen via nasal cannula.   GENERAL: The patient is a 69 year old male lying in the bed comfortably without any acute distress.   EYES: Pupils equal, round, and reactive to light and accommodation. No scleral icterus. Extraocular muscles intact.   HEENT: Head atraumatic, normocephalic. Oropharynx and nasopharynx clear.   NECK: Supple without jugular venous distention. No thyroid enlargement or tenderness.   LUNGS: Decreased breath sounds at the bases. Has wheezing throughout both lungs. No rales or rhonchi.   CARDIOVASCULAR: S1, S2 normal. No murmurs, rubs, or gallops.   ABDOMEN: Soft, nontender, and nondistended. Bowel sounds present. No organomegaly or mass.   EXTREMITIES: No pedal edema, cyanosis, or clubbing.   NEUROLOGIC: Nonfocal examination. Cranial nerves II to XII intact. Muscle strength 5 out of 5. Extremity sensation intact.   PSYCH: The patient is alert and oriented to time, place, and person x3.   SKIN: No obvious rash, lesion, or ulcer.   LABORATORY, DIAGNOSTIC, AND RADIOLOGICAL DATA: Normal BMP except blood glucose of 179. Normal liver function tests. CK 255. MB fraction 9.4. Troponin 0.29. CBC showed white count 14.7, hemoglobin 13.0, hematocrit 39.7, platelets  333. PT 11.8. INR 0.8.    Chest x-ray showed volume loss within the left hemithorax. Possible pulmonary fibrosis. No consultation.   Neck x-ray showed unremarkable soft tissue neck evaluation.   EKG unremarkable.    IMPRESSION AND PLAN:  1. Acute COPD exacerbation. Will start him on IV steroid, nebulizer breathing treatment. Continue his home  inhalers. Monitor on off-unit telemetry.  2. Elevated cardiac enzymes likely due to supply/demand ischemia although cannot rule out MI at this time. Will start him on aspirin, nitroglycerin, beta-blocker, and full dose Lovenox. 3. History of lung cancer, seems to be in remission as per patient. Will monitor.  4. Leukocytosis, could be stress related. Will monitor. No signs of infection.   TOTAL TIME TAKING CARE OF THIS PATIENT: 45 minutes.   CODE STATUS: FULL CODE.   ____________________________ Cyanne Delmar S. Manuella Ghazi, MD vss:drc D: 05/09/2012 15:22:48 ET T: 05/09/2012 15:57:01 ET JOB#: [id number]  cc: Wendle Kina S. Manuella Ghazi, MD, <Dictator> Dr. Willey Blade in [place] MD ELECTRONICALLY SIGNED 05/09/2012 22:45

## 2015-01-15 NOTE — Consult Note (Signed)
PATIENT NAME:  [name], [name] MR#:  [id number] DATE OF BIRTH:  [birth year]  DATE OF CONSULTATION:  05/10/2012  REFERRING PHYSICIAN:  Dr. Manuella Ghazi  CONSULTING PHYSICIAN:  Rogue Jury A. Fletcher Anon, MD  PRIMARY CARE PHYSICIAN: Dr. Willey Blade in [place]: Myocardial infarction.   HISTORY OF PRESENT ILLNESS: This is a 69 year old gentleman with no previous cardiac history. He has known history of lung cancer, status post resection a few years ago as well as chronic obstructive pulmonary disease and previous tobacco use. The patient presented to the hospital yesterday after he had a choking episode while he was taking a vitamin pill. He had significant respiratory distress and hypoxia. He was cold and clammy and was very hypoxic. Oxygen saturation was in the 70s. He was placed on 100% breather and started feeling better after that. His initial troponin was noted to be elevated at 0.29 which continued to rise with elevated CPK and CK-MB. He currently denies chest pain. He does have chronic exertional dyspnea. It is difficult for him to determine if his dyspnea is cardiac related or whether it is related to his known history of lung disease.   PAST MEDICAL HISTORY:  1. Lung cancer, status post resection, currently in remission. He had complications related to pneumothorax and collapsed lung.  2. Chronic obstructive pulmonary disease.  3. Pneumothorax.  4. Previous tobacco use.   HOME MEDICATIONS:  1. Tramadol as needed for pain.  2. Prednisone 40 mg daily.  3. Multivitamin once a day.  4. Combivent as needed.  5. Advair 1 puff twice daily.   ALLERGIES: No known drug allergies.   SOCIAL HISTORY: Negative for alcohol or recreational drug use. He quit smoking after his lung surgery.   FAMILY HISTORY: Negative for premature coronary artery disease. His father had colon cancer.   REVIEW OF SYSTEMS: A 10 point review of systems was performed. It is negative other than what is mentioned in  the history of present illness.   PHYSICAL EXAMINATION:  GENERAL: The patient appears to be at his stated age and in no acute distress.   VITAL SIGNS: Temperature 97.9, pulse 93, respiratory rate 18, blood pressure 137/73, and oxygen saturation is 96% on 2 liters nasal cannula.   HEENT: Normocephalic, atraumatic.   NECK: No jugular venous distention or carotid bruits.   RESPIRATORY: Normal respiratory effort with no use of accessory muscles. Auscultation reveals normal breath sounds.   CARDIOVASCULAR: Normal PMI. Normal S1 and S2 with no gallops or murmurs.   ABDOMEN: Benign, nontender, nondistended.   EXTREMITIES: No clubbing, cyanosis, or edema.   SKIN: Warm and dry with no rash.   PSYCHIATRIC: Alert and oriented x3 with normal mood and affect.   LABORATORY, DIAGNOSTIC, AND RADIOLOGICAL DATA: His labs showed normal renal function. Troponin initially was 0.29 and increased to 1.66. CK-MB was 9.4 and increased to 15. White cell count increased to 25,000 but he is on steroids. INR was normal. BNP 142. ECG showed normal sinus rhythm with no significant ST or T wave changes.   IMPRESSION:  1. Non-ST elevation myocardial infarction.  2. Chronic obstructive pulmonary disease.  3. History of lung cancer, currently in remission.   RECOMMENDATIONS: The patient's rise in cardiac enzymes is suggestive of non-ST elevation myocardial infarction. Although it is possible that this might be due to supply demand ischemia related to significant hypoxia and respiratory distress, the amount of elevation rise seems to be out of proportion to his clinical presentation. Thus, underlying coronary  artery disease will need to be ruled out. I recommend proceeding with cardiac catheterization and possible coronary intervention. Risks, benefits and alternatives were discussed with the patient. I will hold his Lovenox in anticipation of doing the procedure today if the schedule allows. In the meanwhile, continue  aspirin and small dose metoprolol. Further recommendations to follow after cardiac catheterization.   ____________________________ Mertie Clause Fletcher Anon, MD maa:cms D: 05/10/2012 12:01:39 ET T: 05/10/2012 12:47:02 ET JOB#: [id number]  cc: Usama Harkless A. Fletcher Anon, MD, <Dictator> Dr. Willey Blade in [facility] Ferne Reus MD ELECTRONICALLY SIGNED 06/03/2012 9:56

## 2015-01-15 NOTE — Discharge Summary (Signed)
PATIENT NAME:  [name], [name] MR#:  [id number] DATE OF BIRTH:  Oct 01, 1945  DATE OF ADMISSION:  05/09/2012 DATE OF DISCHARGE:  05/11/2012  DISCHARGE DIAGNOSES:  1. Non-ST-elevation myocardial infarction based on cardiac biomarkers with cardiac catheterization showing mild diffuse disease, normal ejection fraction on echocardiogram. Enzyme leak was thought to be secondary to profound coughing spells and hypoxia. Started on aspirin, beta blocker and statin.  2. Acute chronic obstructive pulmonary disease exacerbation, improving on steroids. 3. Leukocytosis, likely stress related.   SECONDARY DIAGNOSES:  1. Chronic obstructive pulmonary disease. 2. History of lung cancer.  3. Pneumothorax.  4. History of lung collapse.   CONSULTANTS: Kathlyn Sacramento, MD - Cardiology  PROCEDURES/RADIOLOGY: Cardiac catheterization on 05/10/2012 showed mild diffuse disease with an ejection fraction of 60%.   2-D echocardiogram on 05/10/2012 showed normal LV systolic function, ejection fraction more than 55%, impaired LV relaxation, and mild concentric left ventricular hypertrophy.   Chest x-ray on 05/10/2012 showed no acute cardiopulmonary disease.   Chest x-ray on 05/09/2012 showed no acute cardiopulmonary disease, possible pulmonary fibrosis.   Soft tissue neck x-ray on 05/09/2012 showed no abnormal findings.   HISTORY AND SHORT HOSPITAL COURSE: The patient is a 69 year old male with the above-mentioned medical problems who was admitted for non-ST-elevation myocardial infarction. Please see Dr. Trena Platt dictated history and physical for further details. He also was found to have chronic obstructive pulmonary disease exacerbation. He was ruled in by serial cardiac biomarkers with troponin peak of up to 1.66 for which cardiology consultation was obtained with Dr. Fletcher Anon who recommended cardiac catheterization which was performed with results dictated above. Catheterization showed mild diffuse coronary artery disease.  The patient was feeling much better on 05/11/2012 and was discharged home in stable condition.   DISCHARGE PHYSICAL EXAMINATION:   VITAL SIGNS: On the date of discharge his temperature was 98.1, heart rate 97 per minute, respirations 19 per minute, blood pressure 126/75 mmHg, and he was saturating 99% on room air.   CARDIOVASCULAR: S1 and S2 normal. No murmurs, rubs or gallops.   LUNGS: Clear to auscultation bilaterally. No wheezing, rales, rhonchi, or crepitation.   ABDOMEN: Soft and benign.   NEUROLOGIC: Nonfocal examination. All other physical examination remained at the baseline.   DISCHARGE MEDICATIONS:  1. Tramadol 50 mg 2 tablets p.o. four times daily as needed.  2. Combivent as needed.  3. Advair 250/50 one puff twice a day.  4. Multivitamin once daily.  5. Prednisone 60 mg p.o. daily, taper by 10 mg daily until finished.  6. Metoprolol 25 mg 1/2 tablet p.o. daily.  7. Aspirin 81 mg p.o. daily.  8. Lipitor 10 mg p.o. at bedtime.   DISCHARGE DIET: Low sodium.   DISCHARGE ACTIVITY: As tolerated.       DISCHARGE INSTRUCTIONS AND FOLLOW-UP: The patient was instructed to follow-up with his primary care physician, Dr. Willey Blade, in [place], in 1 to 2 weeks. He will need follow-up with Dr. Rockey Situ from cardiology in 2 to 4 weeks and [place] [facility] in 4 to 6 weeks.   TOTAL TIME DISCHARGING THIS PATIENT: 45 minutes.  ____________________________ Lucina Mellow. Manuella Ghazi, MD vss:slb D: 05/15/2012 23:34:40 ET T: 05/16/2012 11:45:20 ET JOB#: [id number]  cc: Jacoby Zanni S. Manuella Ghazi, MD, <Dictator> Minna Merritts, MD Paula Compton. Willey Blade, MD Juanito Doom, MD Remer Macho MD ELECTRONICALLY SIGNED 09-08-202013 10:34

## 2015-01-19 ENCOUNTER — Other Ambulatory Visit: Payer: Self-pay | Admitting: Internal Medicine

## 2015-01-19 NOTE — Discharge Summary (Signed)
PATIENT NAME:  [name], [name] MR#:  [id number] DATE OF BIRTH:  11/18/1945  DATE OF ADMISSION:  12/11/2013 DATE OF DISCHARGE:  12/16/2013  PRIMARY CARE PHYSICIAN: Paula Compton. Willey Blade, MD   PULMONOLOGIST: Dr. Annamaria Boots in [place].   FINAL DIAGNOSES: 1. Acute respiratory failure, which resolved.  2. Clinical sepsis with pneumonia.  3. Pulmonary embolism and deep vein thrombosis.  4. Persistent leukocytosis.  5. History of lung cancer.  6. History of coronary artery disease.  7. Epistaxis.   MEDICATIONS ON DISCHARGE: Include, tramadol 50 milligrams 2 tablets 4 times a day as needed for pain, Combivent CFC use as directed as needed, Advair Diskus 250/50 one puff twice a day, multivitamin 1 tablet daily, metoprolol succinate 25 milligrams extended-release 1/2 tablet daily, aspirin 81 milligrams daily, Lipitor 10 milligrams at bedtime, amlodipine 5 milligrams daily, losartan 50 milligrams daily, prednisone 10 milligrams 1 tablet orally daily for 3 days, then go back to alternating 10 milligrams and 5 milligrams, Xarelto 15 milligrams twice a day. I did write the patient the initial 21 day course. Cefuroxime 500 milligrams every 12 hours for 5 more days, then stop.   HOME OXYGEN: No.   DIET: Low-sodium diet, regular consistency.   ACTIVITY: As tolerated.   FOLLOWUP: With Dr. Annamaria Boots 1 week, 1 to 2 weeks with Dr. Willey Blade.   HOSPITAL COURSE: The patient was admitted 12/11/2013 and discharged 12/16/2013. He came into the ER with trouble breathing, found to have a low oxygen saturation, 84%. The patient was admitted with pulmonary emboli and pneumonia. The patient was started on Xarelto for the pulmonary embolism, started on Rocephin and Zithromax for the pneumonia.   LABORATORY AND RADIOLOGICAL DATA DURING THE HOSPITAL COURSE: Included an EKG that showed sinus tachycardia, left atrial enlargement. INR 1.0, glucose 106, BUN 14, creatinine 1.05, sodium 134, potassium 3.9, chloride 101, CO2 of 23, calcium 9.2.  Troponin negative. BNP at 276. White blood cell count 16.1, hemoglobin and hematocrit 11.8 and 35.6, platelet count of 333,000.   Chest x-ray showed new abnormal parenchymal consolidation left mid and lower lung. CT scan recommended. CT scan of the chest for pulmonary embolism showed pulmonary embolism and right middle lobe and posterior basal right lower lobe pulmonary arteries, increased opacification throughout the left lung may reflect pneumonia and large mediastinal lymph nodes can be reactive in the setting of pneumonia, patchy peripheral consolidation right middle lobe and right base consistent with infarct. Blood cultures were negative. Ultrasound of the lower extremity showed deep vein thrombosis right posterior tibial vein branch. Echocardiogram showed EF of 60% to 65%. Antineutrophil cytoplasmic antibody negative. Perinuclear antibody negative. Angiotensin converting enzyme normal range. PSA 1.0. White blood cell count on the 19th went up to 35.2.   Repeat chest x-ray showed mild improvement the left lung airspace disease.   Stool for C. difficile was negative. Stool white blood cells negative. White count on the 20th, 30.1. White count on the 21st of 34.1, creatinine 1.06.   HOSPITAL COURSE PER PROBLEM LIST:  1. For the patient's acute respiratory failure, this had resolved. Initially the patient's pulse oximetry was 84% on room air. Upon discharge 94% on room air. No need for oxygen upon discharge.  2. For the patient's clinical sepsis with pneumonia, started on Rocephin and Zithromax. Zithromax course was finished during the hospital course. The patient was given Ceftin to go home with. Lungs were clear upon discharge.  3. Pulmonary embolism and DVT. The patient was started on Xarelto, was given his initial 21 day  prescription for this. Must get a refill for the next prescription through his primary care physician or pulmonologist.  4. Persistent leukocytosis. I do recommend checking up a  CBC as outpatient to ensure that the leukocytosis improves. Likely this was secondary to steroids. The patient was clinically improved upon discharge.  5. History of lung cancer. Followup with pulmonologist as outpatient. I do recommend a repeat CT scan of the chest to ensure that these lymph nodes are reactive secondary to pneumonia.  6. History of coronary artery disease. The patient is on aspirin and metoprolol as outpatient.  7. Epistaxis, resolved.  8. A history of COPD and chronic prednisone. Followup with pulmonary as outpatient.   TIME SPENT ON DISCHARGE: 35 minutes.   ____________________________ Tana Conch. Leslye Peer, MD [id number] D: 12/16/2013 14:50:08 ET T: 12/16/2013 20:12:37 ET JOB#: [id number]  cc: Tana Conch. Leslye Peer, MD, <Dictator> Dr. Romero Liner MD ELECTRONICALLY SIGNED 12/22/2013 16:25

## 2015-01-19 NOTE — H&P (Signed)
PATIENT NAME:  [name], [name] MR#:  [id number] DATE OF BIRTH:  [birth year]  DATE OF ADMISSION:  12/11/2013  PRIMARY CARE PHYSICIAN: From [place].  EMERGENCY DEPARTMENT PHYSICIAN:  Lisa Roca, MD  CHIEF COMPLAINT: Trouble breathing.   HISTORY OF PRESENT ILLNESS: The patient is a 69 year old male in with history of lung cancer, now in remission, brought in by EMS because of trouble breathing. The patient also has cough about 3 to 4 days. O2 saturations are 84% on room air.   The patient placed on 3 L and sats went up to 96%. CT chest showed pulmonary emboli and also pneumonia. Admitting him for acute respiratory failure due to PE and pneumonia. The patient was seen at bedside and says that he has been having trouble breathing for three days, tried inhalers and prednisone at home with no relief. Also has cough with yellow phlegm and also fever. Everything started on Friday. The patient complains of leg swelling but no orthopnea or PND. Complains of chest pressure due to trouble breathing.   PAST MEDICAL HISTORY: Significant for lung cancer 14 years ago status post surgery. Never received chemotherapy but did receive radiation. The patient's pulmonologist id Dr. Annamaria Boots in [place]. The patient has a diagnosis that includes hypertension.   ALLERGIES: No known allergies.   SOCIAL HISTORY: Previous smoker, now quit for five years and smoked for 30 years. No alcohol. No drugs.   PAST SURGICAL HISTORY: Significant for appendectomy and left lung surgery.   FAMILY HISTORY: The patient's daughter has diabetes but not sure about his father and mother.   HOME MEDICATIONS:  Include: 1.  Aspirin 81 mg daily.  2.  Combivent Respimat 1 puff four times daily.  3.  Lipitor 10 mg p.o. daily.  4.  Metoprolol succinate 25 mg p.o. daily.  5.  Prednisone 10 mg p.o. daily.  6.  Tramadol 50 mg two tablets 4 times daily as needed for pain.  7.  Advair Diskus 250/50 one puff b.i.d.   REVIEW OF SYSTEMS:   CONSTITUTIONAL: Has fever and fatigue and trouble breathing for three days.  EYES: No blurred vision.  ENT: No tinnitus. No epistaxis. No difficulty swallowing.  RESPIRATORY: Has cough, shortness of breath, wheezing for three days.  CARDIOVASCULAR: No orthopnea. The patient does have PND. No palpitations. No syncope. The patient does have chest pressure secondary to wheezing.  GENITOURINARY: No dysuria.  ENDOCRINE: No polyuria or nocturia.  INTEGUMENTARY: No skin rashes.  MUSCULOSKELETAL: No joint pain.  NEUROLOGIC: No numbness or weakness.  PSYCHIATRIC: No anxiety or insomnia.   PHYSICAL EXAMINATION:  VITAL SIGNS: Temperature 98.4, heart rate 106, blood pressure 166/76, O2 sats 84% on room air, improved to 96% on 3 liters.  GENERAL: A 69 year old male, well developed, well nourished. He is in slight distress secondary to respiratory difficulties but he is able to answer the questions appropriately.  HEAD: Normocephalic atraumatic.  EYES: Pupils equally reacting to light. Extraocular movements are intact.  ENT: No tympanic membrane congestion. No external lesions.  NOSE: Mild turbinate hypertrophy present.  THROAT: No oropharyngeal erythema.  NECK: Normal range of motion. No thyroid enlargement. No lymphadenopathy.  RESPIRATORY: Bilateral expiratory wheeze, very mild expiratory wheeze in right and left lung fields. The patient is not using accessory muscles of respiration. Does have a decreased air entry  in both lungs. The patient does not have any chest wall tenderness.  CARDIOVASCULAR: S1, S2 regular, tachycardic. No murmurs. No gallops. Pulses are equal and full and in dorsalis pedis and  femoral artery. ABDOMEN: Soft, nontender, nondistended. Bowel sounds present.  SKIN: Warm and dry. The patient has no cyanosis. No ulcers.  EXTREMITIES: Has 1+ edema up to knees. No calf tenderness.  NEUROLOGIC: Alert, awake, oriented. Cranial nerves II through XII are intact. Motor exam 5/5 in upper  and lower extremities. Senses are intact. DTRs 2+ bilaterally.  PSYCHIATRIC: Mood and affect are within normal limits.   DIAGNOSTIC AND RADIOLOGICAL DATA: CT angio chest shows heart is normal. No pericardial effusion. Pulmonary arteries are patent. The patient has abnormal filling defect in the posterior basal segment of right lower lobe pulmonary artery. Also has developed a filling defect in the right middle lobe artery. The patient also has left lower lobe paratracheal lymph node enlargement and has left pleural effusion which is very small. The patient also has emphysematous changes.   The patient is status post left upper lobectomy, air-space consolidation, interstitial thickening, and ground-glass attenuation throughout the left lung. There are nonspecific findings in the acute setting that may reflect pneumonia. The patient also has peripheral subpleural consolidation in the right middle lobe.   1.  CT chest shows pulmonary emboli in the right middle lobe, posterior basilar right lower lobe, pulmonary arteries.  2.  Nonspecific increased opacification through the left lung representing pneumonia.  3.  Mediastinal lymphadenopathy, likely reactive due to pneumonia. Given the history of cancer, unable to exclude metastasis.  4.  Patchy peripheral consolidation in the right middle lobe and the right base consistent with infarct and hepatic steatosis. Chest x-ray shows new abnormality in the left mid and lower lungs, reflecting pneumonia.  5.  Chest CAT scan is recommended.  6.  No acute abnormality in the right lung.   LABORATORIES: EMOGRAM ` PLATELET CNT                             WBC                    HI  16.1                 3.8-10.6       x10^3/mm^3        RBC                    LO  3.89                 4.40-5.90      x10^6/mm^3        HGB                    LO  11.8                 13.0-18.0      g/dL              HCT                    LO  35.6                 40.0-52.0      %                  MCV                        91  80-100         fL                MCH                        30.4                 26.0-34.0      pg                MCHC                       33.3                 32.0-36.0      g/dL              RDW                    HI  14.8                 11.5-14.5      %                 PLATELET                   333                  150-440        x10^3/mm^3      TROPONIN-I                   < 0.02    ELECTROLYTES: Sodium 134, potassium 3.9, chloride 101, bicarb 23, BUN 14, creatinine 1.05, glucose 106.   EKG shows sinus tachycardia, 103 beats per minute.   ASSESSMENT AND PLAN:  1.  The patient is a 69 year old male patient with acute respiratory failure due to a combination of pneumonia and acute pulmonary emboli. Admit him to telemetry. The patient is hypoxic on room air. Continue oxygen 2 liters to keep saturations above 90.  2.  Regarding pulmonary embolism, he is started on Xarelto. I have discussed the risks and benefits of anticoagulation. We will start Xarelto. The patient will get lower-extremity ultrasound to evaluate for any deep vein thrombosis.  3.  Pneumonia. The patient has elevated white count and hypoxia with cough and fever, so we are going to start Rocephin and Zithromax and continue him on DuoNebs along with the Solu-Medrol and follow clinical improvement. Follow blood culture results.  4.  Hypertension. Right now, he is tachycardic, so we will resume his beta blockers and monitor him on telemetry.  5.  History of non-ST-elevation myocardial infarction. The patient was here in 2013. At that time, he was discharged on aspirin, beta blockers, and statins. The patient is still taking them. Continue them.   TIME SPENT: About 60 minutes.   ____________________________ Epifanio Lesches, MD sk:np D: 12/11/2013 13:49:07 ET T: 12/11/2013 15:16:03 ET JOB#: [id number]  cc: Epifanio Lesches, MD, <Dictator> Epifanio Lesches  MD ELECTRONICALLY SIGNED 12/13/2013 13:54

## 2015-01-29 ENCOUNTER — Encounter: Payer: Self-pay | Admitting: Internal Medicine

## 2015-01-29 ENCOUNTER — Ambulatory Visit (INDEPENDENT_AMBULATORY_CARE_PROVIDER_SITE_OTHER): Payer: Medicare Other | Admitting: Internal Medicine

## 2015-01-29 VITALS — BP 134/62 | HR 89 | Ht 67.0 in | Wt 173.4 lb

## 2015-01-29 DIAGNOSIS — I2699 Other pulmonary embolism without acute cor pulmonale: Secondary | ICD-10-CM | POA: Diagnosis not present

## 2015-01-29 DIAGNOSIS — J432 Centrilobular emphysema: Secondary | ICD-10-CM

## 2015-01-29 MED ORDER — UMECLIDINIUM-VILANTEROL 62.5-25 MCG/INH IN AEPB: INHALATION_SPRAY | RESPIRATORY_TRACT | Status: DC

## 2015-01-29 MED ORDER — UMECLIDINIUM-VILANTEROL 62.5-25 MCG/INH IN AEPB: 1.0000 | INHALATION_SPRAY | Freq: Every day | RESPIRATORY_TRACT | Status: DC

## 2015-01-29 NOTE — Progress Notes (Signed)
Patient ID: [name], male    DOB: November 29, 1945, 69 y.o.   MRN: [id number]  HPI 06/08/11- 69 year old male former smoker followed for COPD, history of small cell cancer, history of pneumothorax.   PCP is Dr Asencion Noble Last here December 03, 2010- Since then had routine physical w/ Dr Willey Blade- okay except for the breathing part. He did use stand-by prednisone 3 months ago on trip to [place] and would like to keep some on hand.  He is okay today, asking about pertussis vaccine and wants flu vax. Last tetanus about 3 years ago- discussed. Has had more than one pneumovax. Had shingles vaccine.  Expects wheeze and dyspnea with exertion- unchanged. Spiriva didn't help- made him worse.   12/07/11-06/08/11- 69 year old male former smoker followed for COPD, history of small cell cancer, history of pneumothorax.   PCP is Dr Asencion Noble Did well through the winter until he caught a cold last week. Coughing clear mucus. Denies fever or purulent sputum. Feels he just needs more time to clear this. Used up last standby prescription for prednisone one month ago. Discussed change of delivery device for Combivent. Advair 250 works as well as Advair 500.  10/13/13- 69 year old male former smoker followed for COPD, history of small cell cancer, history of pneumothorax.  FOLLOWS TIR:[id number] about the same since last visit; continues to use Advair with success. Prednisone alternating 10 mg with 5 mg every other day. CXR 12/14/12 IMPRESSION: Underlying emphysema. Postoperative change in the  left. Bibasilar scarring. No edema or consolidation. Essentially  stable chest appearance compared to prior study.  Original Report Authenticated By: Lowella Grip, M.D.   12/22/2013 [facility] follow up --69 year old male former smoker followed for COPD, history of small cell cancer, history of pneumothorax. Patient returns for a post hospital followup. He was admitted at [facility] on March 16 and found  to have an acute pulmonary embolus. CT chest showed pulmonary embolus involving the right middle lobe and posterior, right lower lobe pulmonary arteries. Patchy peripheral consolidation in the right middle lobe and right base, consistent with infarct. Says he developed worsening dyspnea after recent URI that prompted the ER visit. Also had a DVT in right lower leg.  Pt was also tx for possible PNA on left. Was tx w/ abx, says he finished them yesterday.  Started on Xarelto .  Records requested from [facility].  Since discharge he is feeling some better, but gets winded easily . Still weak.  Can walk on flat surfaces ok , incline makes it harder.  No chest pain, hemopytsis, leg swelling, n/v/d., orthopnea.  Remains on Advair and prednisone 5mg  alt 10mg  .   01/26/14- 69 year old male former smoker followed for COPD, history of small cell cancer, history of pneumothorax, hx DVT/PE/Xarelto FOLLOWS FOR:  CXR done today-Still having SOB with exertion, tightness in chest and some  cough with clear mucus CT chest done 12/11/2013 [facility] noted enlarged paratracheal and hilar nodes, as well as diagnosed pulmonary emboli. Clot was in the right calf based on Doppler. He is concerned about cost of Xarelto.still notes easy dyspnea on exertion some days. He feels better on the days he takes prednisone 10 mg rather than 5, asking to stay on 10 mg daily. CXR 01/26/14 IMPRESSION:  Improved opacities throughout the left lung with volume loss.  Baseline has not been achieved yet compared with January 2015.  Continued followup is warranted.  Electronically Signed  By: Maryclare Bean M.D.  On: 01/26/2014 09:17  03/02/14-  69 year old male former smoker followed for COPD, history of small cell cancer, history of pneumothorax, hx DVT/PE/Xarelto(December 10, 2013) FOLLOWS FOR:  Breathing improved since last OV Little cough, no chest pain. Can now walk 1 mile daily w/o stopping, on room air.  Xarelto is too expensive, unsustainable.  Discussed change to coumadin. Easy bruise on xarelto but no overt bleed.   07/02/14- 69 year old male former smoker followed for COPD, history of small cell cancer, history of pneumothorax, hx DVT/PE/warfarin(December 10, 2013) FOLLOWS FOR: continues to cough up green colored mucus-recently had cold. Pt denies any wheezing, SOB, fever, or chills. Recent chest cold clearing very slowly with residual cough and some green sputum. No fever, blood or chest pain. Converted to warfarin because Xarelto was too expensive. Managed by Coumadin clinic. No bleeding.  10/02/14- 69 year old male former smoker followed for COPD, history of small cell cancer, history of pneumothorax, hx DVT/PE/warfarin(December 10, 2013) FOLLOWS FOR: Pt c/o sneezing, runny nose, wheezing and productive cough with yellow mucus at times. Denies fever. Using cough drops PRN cough. Pt reports having a recent subconjunctival hemorrhage with high BPs  CXR 03/02/14 IMPRESSION: Postsurgical changes the left hemithorax. No evidence of acute cardiopulmonary disease. Electronically Signed  By: Julian Hy M.D.  On: 03/02/2014 12:59  10/24/2014 69 year old male former smoker followed for COPD, history of small cell cancer, history of pneumothorax, hx DVT/PE/warfarin(December 10, 2013) Pt presents for an acute office visit.  Complains of increased SOB, wheezing, tightness in chest, prod cough with clear mucus worse x2 days.   Denies f/c/s, n/v/d, hemoptysis, chest pain, orthopnea, or leg swelling Had URI /bronchitis 3 weeks ago, took Augmentin . Felt better until this week.  >>doxycycline /pred taper   11/21/2014 ER follow up : 69 year old male former smoker followed for COPD, history of small cell cancer s/p resection (2000) , history of pneumothorax, hx DVT/PE/warfarin(December 10, 2013) Complains of 1 week with productive cough with blood tinged mucus. No frank hemoptysis .  Thick mucus, dypspnea and wheezing.  Seen 1 month ago with COPD  flare , tx w/ abx and steroids .  CXR 10/24/14 showed chronic changes.  On coumadin for PE -INR 2/23 2.2  CXR in ER at [facility] noted a PNA yesterday, report says ?Left basilar infiltrate.  started on Levaquin 750mg  daily and pred taper.  Feels weak. No dizziness , chest pain, n/vd/. No hemoptysis or blood tinged mucus today.  CT chest 01/2014 with COPD changes , stable nodules , improved aeration.  Appetite is good. CXR 10/24/2014  Independently reviewed this image Bilateral pleural parenchymal scarring. Postsurgical changes left lung. No acute cardiopulmonary disease Report CXR 11/20/14 [facility]  ?left basilar infiltrate   12/12/14-69 year old male former smoker followed for COPD, history of small cell cancer, history of pneumothorax, hx DVT/PE/warfarin(December 10, 2013) FOLLOWS FOR: follow up per TP due to PNA-CXR done today as well. Pt continues to have SOB as before but starting to feel slightly better. Coughing clear phlegm. On prednisone 5 mg twice daily, his feet have begun to swell. CXR 12/12/14 (today) IMPRESSION: COPD changes with scattered scarring bilaterally greater on LEFT. No definite acute abnormalities. Electronically Signed By: Lavonia Dana M.D. On: 12/12/2014 10:49  12/26/14- 69 year old male former smoker followed for COPD, history of small cell cancer, history of pneumothorax, hx DVT/PE/warfarin(December 10, 2013) FOLLOWS FOR: fullness in chest and head; swelling in feet. Could not tell a big difference with using Advair 500 vs Advair 250. Cough productive yellow. Still feels he is retaining fluid. Continues  prednisone 5 mg twice daily.  01/29/15- 69 year old male former smoker followed for COPD, history of small cell cancer, history of pneumothorax, hx DVT/PE/warfarin(December 10, 2013) FOLLOWS FOR:Sob improved over 2-3 mths.,occass. wheezing,nasal congestion,bilat. edema ankles and calves,no fcs,occass. midchest tightness Says he is breathing better with no acute events. Seldom uses  rescue inhaler now. Feet still swell some since pneumonia in January and he shows rash on leg which looks like early stasis dermatitis. Coumadin has been managed by his cardiologist in [place]. Maintenance prednisone is now at 5 mg daily  ROS-see HPI   Negative unless "+" Constitutional:    weight loss, night sweats, fevers, chills, fatigue, lassitude. HEENT:    headaches, difficulty swallowing, tooth/dental problems, sore throat,       sneezing, itching, ear ache, +nasal congestion, post nasal drip, snoring CV:    chest pain, orthopnea, PND, +swelling in lower extremities, anasarca,                                  dizziness, palpitations Resp:   +shortness of breath with exertion or at rest.                +productive cough,   non-productive cough, coughing up of blood.              change in color of mucus.  wheezing.   Skin:    rash or lesions. GI:  No-   heartburn, indigestion, abdominal pain, nausea, vomiting,  GU: . MS:   joint pain, stiffness,  Neuro-     nothing unusual Psych:  change in mood or affect.  depression or anxiety.   memory loss.  OBJ- Physical Exam General- Alert, Oriented, Affect-appropriate, Distress- none acute Skin- rash-none, lesions- none, excoriation- none Lymphadenopathy- none Head- atraumatic            Eyes- Gross vision intact, PERRLA, conjunctivae and secretions clear            Ears- Hearing, canals-normal            Nose- Clear, no-Septal dev, mucus, polyps, erosion, perforation             Throat- Mallampati II , mucosa clear , drainage- none, tonsils- atrophic Neck- flexible , trachea midline, no stridor , thyroid nl, carotid no bruit Chest - symmetrical excursion , unlabored           Heart/CV- RRR , no murmur , no gallop  , no rub, nl s1 s2                           - JVD- none , edema+2-3, stasis changes- none, varices- none           Lung- + raspy breath sounds, wheeze- none, cough + light, dullness-none, rub- none           Chest wall-   Abd-  Br/ Gen/ Rectal- Not done, not indicated Extrem- cyanosis- none, clubbing, none, atrophy- none, strength- nl Neuro- grossly intact to observation

## 2015-01-29 NOTE — Patient Instructions (Addendum)
Sample and script for Anoro inhaler   1 puff, once daily Try this instead of Advair for your breathing  Elevate your legs as much as you can, and keep the skin clean and dry

## 2015-02-01 DIAGNOSIS — I2699 Other pulmonary embolism without acute cor pulmonale: Secondary | ICD-10-CM | POA: Diagnosis not present

## 2015-02-01 DIAGNOSIS — I10 Essential (primary) hypertension: Secondary | ICD-10-CM | POA: Diagnosis not present

## 2015-02-04 ENCOUNTER — Telehealth: Payer: Self-pay | Admitting: Internal Medicine

## 2015-02-04 DIAGNOSIS — I82401 Acute embolism and thrombosis of unspecified deep veins of right lower extremity: Secondary | ICD-10-CM

## 2015-02-04 NOTE — Telephone Encounter (Signed)
Spoke with pt. He recently saw his PCP. They wanted to know how much longer he was going to be on Coumadin. Pt states that CY started him on this medication.  CY - please advise. Thanks.

## 2015-02-04 NOTE — Telephone Encounter (Signed)
Spoke with pt and advised of Dr Janee Morn recommendations.  Order placed for venous doppler.

## 2015-02-04 NOTE — Telephone Encounter (Signed)
He had R leg clot (DVT) with pulmonary embolism documented March, 2015.  Order- bilateral lower extremity venous dopplers for dx DVT. Because of his leg edema, I want to see what veins look like now before stopping [name]

## 2015-02-06 ENCOUNTER — Ambulatory Visit: Payer: Medicare Other

## 2015-02-06 ENCOUNTER — Ambulatory Visit (INDEPENDENT_AMBULATORY_CARE_PROVIDER_SITE_OTHER): Payer: Medicare Other

## 2015-02-06 DIAGNOSIS — Z5181 Encounter for therapeutic drug level monitoring: Secondary | ICD-10-CM

## 2015-02-06 DIAGNOSIS — I2699 Other pulmonary embolism without acute cor pulmonale: Secondary | ICD-10-CM | POA: Diagnosis not present

## 2015-02-06 LAB — POCT INR: INR: 1.7

## 2015-02-06 NOTE — Assessment & Plan Note (Signed)
Has a complicated medical history. He has been on anticoagulant therapy since his DVT over a year ago. We have emphasized the importance of avoiding stagnant blood flow. Elevate feet when possible, keep moving. Consider elastic hose. He can decide if he wants to stop anticoagulant now and he will discuss this with his cardiologist.

## 2015-02-06 NOTE — Assessment & Plan Note (Signed)
Now at baseline. We discussed maintenance prednisone and effect on ankle edema. We can try reducing his total steroid dose a little by changing Advair inhaler to [employer]

## 2015-02-12 ENCOUNTER — Encounter (HOSPITAL_COMMUNITY): Payer: Medicare Other

## 2015-02-16 ENCOUNTER — Other Ambulatory Visit: Payer: Self-pay | Admitting: Internal Medicine

## 2015-02-19 ENCOUNTER — Ambulatory Visit (HOSPITAL_COMMUNITY): Payer: Medicare Other | Attending: Cardiovascular Disease

## 2015-02-19 DIAGNOSIS — I82401 Acute embolism and thrombosis of unspecified deep veins of right lower extremity: Secondary | ICD-10-CM | POA: Insufficient documentation

## 2015-02-21 ENCOUNTER — Telehealth: Payer: Self-pay | Admitting: Internal Medicine

## 2015-02-21 NOTE — Telephone Encounter (Signed)
I called spoke with pt. He is wanting to know when he is able to come off the coumadin. He reports he was told by [place] he did not have further blood clots. Please advise CDY thanks

## 2015-02-26 NOTE — Telephone Encounter (Signed)
CY - please advise. Thanks! 

## 2015-02-27 ENCOUNTER — Ambulatory Visit (INDEPENDENT_AMBULATORY_CARE_PROVIDER_SITE_OTHER): Payer: Medicare Other

## 2015-02-27 DIAGNOSIS — I2699 Other pulmonary embolism without acute cor pulmonale: Secondary | ICD-10-CM

## 2015-02-27 DIAGNOSIS — Z5181 Encounter for therapeutic drug level monitoring: Secondary | ICD-10-CM

## 2015-02-27 LAB — POCT INR: INR: 2.1

## 2015-02-28 NOTE — Telephone Encounter (Signed)
Per 5.3.16 ov w/ CY: Pulmonary embolus - Steven Lever, MD at 02/06/2015  9:30 PM       Status: Written Related Problem: Pulmonary embolus    Expand All Collapse All   Has a complicated medical history. He has been on anticoagulant therapy since his DVT over a year ago. We have emphasized the importance of avoiding stagnant blood flow. Elevate feet when possible, keep moving. Consider elastic hose. He can decide if he wants to stop anticoagulant now and he will discuss this with his cardiologist     Pt seen by coumadin clinic in [place] 6.1.16 and was recommended to continue same dosing Called spoke with pt who reports he sees Dr [name] yearly and is not due for next appt until August/September of this year Patient is also requesting results of the 5.24.16 doppler - results printed along with message  Dr Annamaria Boots please advise, thank you.

## 2015-03-01 DIAGNOSIS — I2699 Other pulmonary embolism without acute cor pulmonale: Secondary | ICD-10-CM | POA: Diagnosis not present

## 2015-03-01 DIAGNOSIS — R600 Localized edema: Secondary | ICD-10-CM | POA: Diagnosis not present

## 2015-03-01 NOTE — Telephone Encounter (Signed)
Pt's wife is aware that we are going to discontinue coumadin. Nothing further was needed.

## 2015-03-01 NOTE — Telephone Encounter (Signed)
Patient's PCP called today with patient in office to discuss coumadin. 1 year since DVT, compliant with coumadin, recent leg vein dopplers negative. Plan- ok to d/c coumadin. Avoid stasis- routine advice.

## 2015-03-01 NOTE — Telephone Encounter (Signed)
We are dc'ing coumadin per phone note today.

## 2015-03-01 NOTE — Telephone Encounter (Signed)
Pt seen by coumadin clinic in [place] 6.1.16 and was recommended to continue same dosing  Called spoke with pt who reports he sees Dr Rockey Situ yearly and is not due for next appt until August/September of this year  Patient is also requesting results of the 5.24.16 doppler - results printed along with message  ------------------------------------------------------------------------ Per Last OV Has a complicated medical history. He has been on anticoagulant therapy since his DVT over a year ago. We have emphasized the importance of avoiding stagnant blood flow. Elevate feet when possible, keep moving. Consider elastic hose.  He can decide if he wants to stop anticoagulant now and he will discuss this with his cardiologist -----------------------------------------------------------------------------  Dr. Annamaria Boots, Please advise.

## 2015-03-11 DIAGNOSIS — B359 Dermatophytosis, unspecified: Secondary | ICD-10-CM | POA: Diagnosis not present

## 2015-03-11 DIAGNOSIS — B355 Tinea imbricata: Secondary | ICD-10-CM | POA: Diagnosis not present

## 2015-03-18 DIAGNOSIS — B355 Tinea imbricata: Secondary | ICD-10-CM | POA: Diagnosis not present

## 2015-03-27 ENCOUNTER — Other Ambulatory Visit: Payer: Self-pay | Admitting: Internal Medicine

## 2015-03-28 ENCOUNTER — Telehealth: Payer: Self-pay | Admitting: Internal Medicine

## 2015-03-28 NOTE — Telephone Encounter (Signed)
[phone], pt cb or [phone]

## 2015-03-28 NOTE — Telephone Encounter (Signed)
Per 01/29/15 OV: Patient Instructions       Sample and script for Anoro inhaler   1 puff, once daily Try this instead of Advair for your breathing Elevate your legs as much as you can, and keep the skin clean and dry  ---  lmomtcb x1

## 2015-03-28 NOTE — Telephone Encounter (Signed)
CY changed Advair to Anoro last ov 01/29/15. Need to know if Anoro was not helping and patient wished to switch back to Advair.

## 2015-03-28 NOTE — Telephone Encounter (Signed)
Spoke with pt, states he could not tolerate Anoro-states he could not breathe as well.  Pt has gone back to using advair.   Nothing further needed at this time.

## 2015-04-05 DIAGNOSIS — I2699 Other pulmonary embolism without acute cor pulmonale: Secondary | ICD-10-CM | POA: Diagnosis not present

## 2015-04-05 DIAGNOSIS — I10 Essential (primary) hypertension: Secondary | ICD-10-CM | POA: Diagnosis not present

## 2015-04-11 DIAGNOSIS — B355 Tinea imbricata: Secondary | ICD-10-CM | POA: Diagnosis not present

## 2015-04-22 ENCOUNTER — Ambulatory Visit: Payer: Self-pay | Admitting: General Practice

## 2015-04-22 DIAGNOSIS — Z5181 Encounter for therapeutic drug level monitoring: Secondary | ICD-10-CM

## 2015-04-22 DIAGNOSIS — I2699 Other pulmonary embolism without acute cor pulmonale: Secondary | ICD-10-CM

## 2015-04-30 ENCOUNTER — Encounter: Payer: Self-pay | Admitting: Internal Medicine

## 2015-04-30 ENCOUNTER — Ambulatory Visit (INDEPENDENT_AMBULATORY_CARE_PROVIDER_SITE_OTHER): Payer: Medicare Other | Admitting: Internal Medicine

## 2015-04-30 VITALS — BP 124/68 | HR 79 | Ht 67.0 in | Wt 165.2 lb

## 2015-04-30 DIAGNOSIS — J438 Other emphysema: Secondary | ICD-10-CM | POA: Diagnosis not present

## 2015-04-30 MED ORDER — FLUTICASONE FUROATE-VILANTEROL 100-25 MCG/INH IN AEPB: 1.0000 | INHALATION_SPRAY | Freq: Every day | RESPIRATORY_TRACT | Status: DC

## 2015-04-30 MED ORDER — FLUTICASONE-SALMETEROL 250-50 MCG/DOSE IN AEPB: INHALATION_SPRAY | RESPIRATORY_TRACT | Status: DC

## 2015-04-30 MED ORDER — PREDNISONE 5 MG PO TABS: 10.0000 mg | ORAL_TABLET | Freq: Every day | ORAL | Status: DC

## 2015-04-30 MED ORDER — FLUTICASONE-SALMETEROL 250-50 MCG/DOSE IN AEPB: 1.0000 | INHALATION_SPRAY | Freq: Two times a day (BID) | RESPIRATORY_TRACT | Status: DC

## 2015-04-30 MED ORDER — FLUTICASONE FUROATE-VILANTEROL 100-25 MCG/INH IN AEPB: INHALATION_SPRAY | RESPIRATORY_TRACT | Status: DC

## 2015-04-30 NOTE — Assessment & Plan Note (Signed)
Improved with less dyspnea on maintenance prednisone 10 mg daily, discussed. Did not like Anoro but Advair is too expensive. Plan-try Breo Ellipta. He will use either this or Advair based on preference and cost.

## 2015-04-30 NOTE — Patient Instructions (Signed)
Scripts printed: Prednisone, to continue 2 x 5 mg daily for now  Breo Ellipta 100- sample and script    1 puff then rinse mouth, once daily. Try this instead of Advair. If you like it, then take the script to the drug store to see what it would cost compared with Advair 250.  Sample and script Advair 250  If you still prefer this to the [facility] sample, then continue using Advair 250, 1 puff then rinse mouth, twice daily.

## 2015-04-30 NOTE — Progress Notes (Signed)
Patient ID: [name], male    DOB: November 29, 1945, 69 y.o.   MRN: [id number]  HPI 06/08/11- 69 year old male former smoker followed for COPD, history of small cell cancer, history of pneumothorax.   PCP is Dr Asencion Noble Last here December 03, 2010- Since then had routine physical w/ Dr Willey Blade- okay except for the breathing part. He did use stand-by prednisone 3 months ago on trip to [place] and would like to keep some on hand.  He is okay today, asking about pertussis vaccine and wants flu vax. Last tetanus about 3 years ago- discussed. Has had more than one pneumovax. Had shingles vaccine.  Expects wheeze and dyspnea with exertion- unchanged. Spiriva didn't help- made him worse.   12/07/11-06/08/11- 69 year old male former smoker followed for COPD, history of small cell cancer, history of pneumothorax.   PCP is Dr Asencion Noble Did well through the winter until he caught a cold last week. Coughing clear mucus. Denies fever or purulent sputum. Feels he just needs more time to clear this. Used up last standby prescription for prednisone one month ago. Discussed change of delivery device for Combivent. Advair 250 works as well as Advair 500.  10/13/13- 69 year old male former smoker followed for COPD, history of small cell cancer, history of pneumothorax.  FOLLOWS TIR:[id number] about the same since last visit; continues to use Advair with success. Prednisone alternating 10 mg with 5 mg every other day. CXR 12/14/12 IMPRESSION: Underlying emphysema. Postoperative change in the  left. Bibasilar scarring. No edema or consolidation. Essentially  stable chest appearance compared to prior study.  Original Report Authenticated By: Lowella Grip, M.D.   12/22/2013 [facility] follow up --69 year old male former smoker followed for COPD, history of small cell cancer, history of pneumothorax. Patient returns for a post hospital followup. He was admitted at [facility] on March 16 and found  to have an acute pulmonary embolus. CT chest showed pulmonary embolus involving the right middle lobe and posterior, right lower lobe pulmonary arteries. Patchy peripheral consolidation in the right middle lobe and right base, consistent with infarct. Says he developed worsening dyspnea after recent URI that prompted the ER visit. Also had a DVT in right lower leg.  Pt was also tx for possible PNA on left. Was tx w/ abx, says he finished them yesterday.  Started on Xarelto .  Records requested from [facility].  Since discharge he is feeling some better, but gets winded easily . Still weak.  Can walk on flat surfaces ok , incline makes it harder.  No chest pain, hemopytsis, leg swelling, n/v/d., orthopnea.  Remains on Advair and prednisone 5mg  alt 10mg  .   01/26/14- 69 year old male former smoker followed for COPD, history of small cell cancer, history of pneumothorax, hx DVT/PE/Xarelto FOLLOWS FOR:  CXR done today-Still having SOB with exertion, tightness in chest and some  cough with clear mucus CT chest done 12/11/2013 [facility] noted enlarged paratracheal and hilar nodes, as well as diagnosed pulmonary emboli. Clot was in the right calf based on Doppler. He is concerned about cost of Xarelto.still notes easy dyspnea on exertion some days. He feels better on the days he takes prednisone 10 mg rather than 5, asking to stay on 10 mg daily. CXR 01/26/14 IMPRESSION:  Improved opacities throughout the left lung with volume loss.  Baseline has not been achieved yet compared with January 2015.  Continued followup is warranted.  Electronically Signed  By: Maryclare Bean M.D.  On: 01/26/2014 09:17  03/02/14-  69 year old male former smoker followed for COPD, history of small cell cancer, history of pneumothorax, hx DVT/PE/Xarelto(December 10, 2013) FOLLOWS FOR:  Breathing improved since last OV Little cough, no chest pain. Can now walk 1 mile daily w/o stopping, on room air.  Xarelto is too expensive, unsustainable.  Discussed change to coumadin. Easy bruise on xarelto but no overt bleed.   07/02/14- 69 year old male former smoker followed for COPD, history of small cell cancer, history of pneumothorax, hx DVT/PE/warfarin(December 10, 2013) FOLLOWS FOR: continues to cough up green colored mucus-recently had cold. Pt denies any wheezing, SOB, fever, or chills. Recent chest cold clearing very slowly with residual cough and some green sputum. No fever, blood or chest pain. Converted to warfarin because Xarelto was too expensive. Managed by Coumadin clinic. No bleeding.  10/02/14- 69 year old male former smoker followed for COPD, history of small cell cancer, history of pneumothorax, hx DVT/PE/warfarin(December 10, 2013) FOLLOWS FOR: Pt c/o sneezing, runny nose, wheezing and productive cough with yellow mucus at times. Denies fever. Using cough drops PRN cough. Pt reports having a recent subconjunctival hemorrhage with high BPs  CXR 03/02/14 IMPRESSION: Postsurgical changes the left hemithorax. No evidence of acute cardiopulmonary disease. Electronically Signed  By: Julian Hy M.D.  On: 03/02/2014 12:59  10/24/2014 69 year old male former smoker followed for COPD, history of small cell cancer, history of pneumothorax, hx DVT/PE/warfarin(December 10, 2013) Pt presents for an acute office visit.  Complains of increased SOB, wheezing, tightness in chest, prod cough with clear mucus worse x2 days.   Denies f/c/s, n/v/d, hemoptysis, chest pain, orthopnea, or leg swelling Had URI /bronchitis 3 weeks ago, took Augmentin . Felt better until this week.  >>doxycycline /pred taper   11/21/2014 ER follow up : 69 year old male former smoker followed for COPD, history of small cell cancer s/p resection (2000) , history of pneumothorax, hx DVT/PE/warfarin(December 10, 2013) Complains of 1 week with productive cough with blood tinged mucus. No frank hemoptysis .  Thick mucus, dypspnea and wheezing.  Seen 1 month ago with COPD  flare , tx w/ abx and steroids .  CXR 10/24/14 showed chronic changes.  On coumadin for PE -INR 2/23 2.2  CXR in ER at [facility] noted a PNA yesterday, report says ?Left basilar infiltrate.  started on Levaquin 750mg  daily and pred taper.  Feels weak. No dizziness , chest pain, n/vd/. No hemoptysis or blood tinged mucus today.  CT chest 01/2014 with COPD changes , stable nodules , improved aeration.  Appetite is good. CXR 10/24/2014  Independently reviewed this image Bilateral pleural parenchymal scarring. Postsurgical changes left lung. No acute cardiopulmonary disease Report CXR 11/20/14 [facility]  ?left basilar infiltrate   12/12/14-69 year old male former smoker followed for COPD, history of small cell cancer, history of pneumothorax, hx DVT/PE/warfarin(December 10, 2013) FOLLOWS FOR: follow up per TP due to PNA-CXR done today as well. Pt continues to have SOB as before but starting to feel slightly better. Coughing clear phlegm. On prednisone 5 mg twice daily, his feet have begun to swell. CXR 12/12/14 (today) IMPRESSION: COPD changes with scattered scarring bilaterally greater on LEFT. No definite acute abnormalities. Electronically Signed By: Lavonia Dana M.D. On: 12/12/2014 10:49  12/26/14- 69 year old male former smoker followed for COPD, history of small cell cancer, history of pneumothorax, hx DVT/PE/warfarin(December 10, 2013) FOLLOWS FOR: fullness in chest and head; swelling in feet. Could not tell a big difference with using Advair 500 vs Advair 250. Cough productive yellow. Still feels he is retaining fluid. Continues  prednisone 5 mg twice daily.  01/29/15- 69 year old male former smoker followed for COPD, history of small cell cancer, history of pneumothorax, hx DVT/PE/warfarin(December 10, 2013) FOLLOWS FOR:Sob improved over 2-3 mths.,occass. wheezing,nasal congestion,bilat. edema ankles and calves,no fcs,occass. midchest tightness Says he is breathing better with no acute events. Seldom uses  rescue inhaler now. Feet still swell some since pneumonia in January and he shows rash on leg which looks like early stasis dermatitis. Coumadin has been managed by his cardiologist in [place]. Maintenance prednisone is now at 5 mg daily  04/30/15-69 year old male former smoker followed for COPD, history of small cell cancer, history of pneumothorax, hx DVT/PE/warfarin(March 15, 83) Follows For: SOB has improved. Currently using Advair. Denies any coughing or wheezing.  Disliked in oral and went back to Advair which is expensive. Continues prednisone 10 mg daily, finding 5 mg was not enough. CXR 12/12/14 IMPRESSION: COPD changes with scattered scarring bilaterally greater on LEFT. No definite acute abnormalities. Electronically Signed  By: Lavonia Dana M.D.  On: 12/12/2014 10:49  ROS-see HPI   Negative unless "+" Constitutional:    weight loss, night sweats, fevers, chills, fatigue, lassitude. HEENT:    headaches, difficulty swallowing, tooth/dental problems, sore throat,       sneezing, itching, ear ache, +nasal congestion, post nasal drip, snoring CV:    chest pain, orthopnea, PND, +swelling in lower extremities, anasarca,                                                     dizziness, palpitations Resp:   +shortness of breath with exertion or at rest.                productive cough,   non-productive cough, coughing up of blood.              change in color of mucus.  wheezing.   Skin:    rash or lesions. GI:  No-   heartburn, indigestion, abdominal pain, nausea, vomiting,  GU: . MS:   joint pain, stiffness,  Neuro-     nothing unusual Psych:  change in mood or affect.  depression or anxiety.   memory loss.  OBJ- Physical Exam General- Alert, Oriented, Affect-appropriate, Distress- none acute Skin- rash-none, lesions- none, excoriation- none Lymphadenopathy- none Head- atraumatic            Eyes- Gross vision intact, PERRLA, conjunctivae and secretions clear            Ears-  Hearing, canals-normal            Nose- Clear, no-Septal dev, mucus, polyps, erosion, perforation             Throat- Mallampati II , mucosa clear , drainage- none, tonsils- atrophic Neck- flexible , trachea midline, no stridor , thyroid nl, carotid no bruit Chest - symmetrical excursion , unlabored           Heart/CV- RRR , no murmur , no gallop  , no rub, nl s1 s2                           - JVD- none , edema-none stasis changes- none, varices+           Lung- + few crackles right base, wheeze- none, cough + light, dullness-none, rub- none  Chest wall-  Abd-  Br/ Gen/ Rectal- Not done, not indicated Extrem- cyanosis- none, clubbing, none, atrophy- none, strength- nl, + superficial varices Neuro- grossly intact to observation

## 2015-06-12 ENCOUNTER — Encounter: Payer: Self-pay | Admitting: Cardiovascular Disease

## 2015-06-12 ENCOUNTER — Ambulatory Visit (INDEPENDENT_AMBULATORY_CARE_PROVIDER_SITE_OTHER): Payer: Medicare Other | Admitting: Cardiovascular Disease

## 2015-06-12 VITALS — BP 120/76 | HR 78 | Ht 67.0 in | Wt 162.0 lb

## 2015-06-12 DIAGNOSIS — I158 Other secondary hypertension: Secondary | ICD-10-CM | POA: Diagnosis not present

## 2015-06-12 DIAGNOSIS — I2699 Other pulmonary embolism without acute cor pulmonale: Secondary | ICD-10-CM | POA: Diagnosis not present

## 2015-06-12 DIAGNOSIS — R609 Edema, unspecified: Secondary | ICD-10-CM

## 2015-06-12 DIAGNOSIS — E785 Hyperlipidemia, unspecified: Secondary | ICD-10-CM | POA: Diagnosis not present

## 2015-06-12 DIAGNOSIS — J438 Other emphysema: Secondary | ICD-10-CM

## 2015-06-12 NOTE — Progress Notes (Signed)
Patient ID: [name], male    DOB: 14-Jul-1946, 69 y.o.   MRN: [id number]  HPI Comments: Mr. Depaul is a pleasant 69 year old gentleman with history of lung cancer, COPD who had a non-ST elevation MI after choking on a vitamin pill, admission to [facility] 05/09/2012 with peak troponin 1.5. He had a cardiac catheterization that showed nonocclusive coronary artery disease, several regions of 20% stenosis otherwise normal ejection fraction, normal echocardiogram. He presents for routine followup of his coronary artery disease, shortness of breath  Lab work in the hospital showed BNP 142, peak troponin 1.5,   In follow up today, he reports having a problem with his eye, "Clot and bleeding "  anticoagulation was held. Details are unavailable He has frequent follow-up with pulmonary for his COPD Denies any worsening leg edema, shortness of breath He is not taking furosemide, only chlorthalidone  EKG on today's visit shows normal sinus rhythm with rate 78 bpm, no significant ST or T-wave changes  Other past medical history  hospital admission December 12 2011 with discharge 12/16/2013. Final diagnosis was acute respiratory failure, pneumonia with clinical sepsis, DVT with PE.  He was started on xarelto initially 15 mg twice a day then changed to 20 mg daily    on chronic steroids for lung disease. Prior lung cancer 15 years ago with treatment at that time  Echocardiogram 12/13/2011 showed normal LV function, moderately elevated right ventricular systolic pressure Ultrasound of the leg showed DVT in the right posterior tibial vein branch otherwise no other DVT CT scan of the chest showed PE of the right middle lobe and posterior basal right lower lobe pulmonary arteries  Prior lab work, Total cholesterol 158, LDL 65, HDL 54   Allergies  Allergen Reactions  . Other Other (See Comments)    Hydromet cough syrup-causes GI upset  . Codeine Nausea And Vomiting    Current Outpatient Prescriptions on  File Prior to Visit  Medication Sig Dispense Refill  . acetaminophen (TYLENOL) 325 MG tablet Take 650 mg by mouth as needed.      Marland Kitchen albuterol (PROAIR HFA) 108 (90 BASE) MCG/ACT inhaler 2 puffs every 4 hours if needed- rescue 1 Inhaler 11  . aspirin 81 MG EC tablet Take 81 mg by mouth daily. Swallow whole.    Marland Kitchen atorvastatin (LIPITOR) 10 MG tablet TAKE 1 TABLET BY MOUTH EVERY DAY 90 tablet 3  . chlorthalidone (HYGROTON) 25 MG tablet Take 25 mg by mouth daily.    . clobetasol cream (TEMOVATE) 0.05 % Apply 1.02 application topically as needed.    . Fluticasone Furoate-Vilanterol (BREO ELLIPTA) 100-25 MCG/INH AEPB Inhale 1 puff into the lungs daily. 1 each 0  . guaiFENesin (MUCINEX) 600 MG 12 hr tablet Take 600 mg by mouth daily.     Marland Kitchen losartan (COZAAR) 100 MG tablet Take 100 mg by mouth daily.  3  . predniSONE (DELTASONE) 5 MG tablet Take 2 tablets (10 mg total) by mouth daily. 60 tablet 12  . traMADol (ULTRAM) 50 MG tablet Takes 2 tablets 4 times daily as needed for pain.     No current facility-administered medications on file prior to visit.    Past Medical History  Diagnosis Date  . Dyspnea   . COPD (chronic obstructive pulmonary disease)   . Small cell carcinoma of lung   . Spontaneous pneumothorax   . Arthritis of lumbar spine   . Collapsed lung     x3  . Asthma   . Pneumothorax   .  History of benign thyroid tumor   . Lung cancer   . Acute MI   . Clotting disorder   . Pneumonia     Past Surgical History  Procedure Laterality Date  . Left upper lobectomy for small cell lung cancer    . Chest tube insertion    . Vats r thoracotomy    . Benign thryoid nodule resected    . Appendectomy    . Cardiac catheterization  2013    [facility]    Social History  reports that he has quit smoking. His smoking use included Cigars. He does not have any smokeless tobacco history on file. He reports that he does not drink alcohol or use illicit drugs.  Family History family history includes  Colon cancer in his father; Heart disease in his mother; Heart failure in his brother.   Review of Systems  Constitutional: Negative.   Respiratory: Negative.   Cardiovascular: Negative.   Gastrointestinal: Negative.   Musculoskeletal: Negative.   Neurological: Negative.   Hematological: Negative.   Psychiatric/Behavioral: Negative.   All other systems reviewed and are negative.  BP 120/76 mmHg  Pulse 78  Ht '5\' 7"'$  (1.702 m)  Wt 162 lb (73.483 kg)  BMI 25.37 kg/m2  Physical Exam  Constitutional: He is oriented to person, place, and time. He appears well-developed and well-nourished.  HENT:  Head: Normocephalic.  Nose: Nose normal.  Mouth/Throat: Oropharynx is clear and moist.  Eyes: Conjunctivae are normal. Pupils are equal, round, and reactive to light.  Neck: Normal range of motion. Neck supple. No JVD present.  Cardiovascular: Normal rate, regular rhythm, S1 normal, S2 normal, normal heart sounds and intact distal pulses.  Exam reveals no gallop and no friction rub.   No murmur heard. Pulmonary/Chest: Effort normal and breath sounds normal. No respiratory distress. He has no wheezes. He has no rales. He exhibits no tenderness.  Abdominal: Soft. Bowel sounds are normal. He exhibits no distension. There is no tenderness.  Musculoskeletal: Normal range of motion. He exhibits no edema or tenderness.  Lymphadenopathy:    He has no cervical adenopathy.  Neurological: He is alert and oriented to person, place, and time. Coordination normal.  Skin: Skin is warm and dry. No rash noted. No erythema.  Psychiatric: He has a normal mood and affect. His behavior is normal. Judgment and thought content normal.      Assessment and Plan   Nursing note and vitals reviewed.

## 2015-06-12 NOTE — Assessment & Plan Note (Signed)
Prior total cholesterol in the 150 range. No recent blood work available

## 2015-06-12 NOTE — Patient Instructions (Signed)
You are doing well. No medication changes were made.  Please call us if you have new issues that need to be addressed before your next appt.  Your physician wants you to follow-up in: 12 months.  You will receive a reminder letter in the mail two months in advance. If you don't receive a letter, please call our office to schedule the follow-up appointment. 

## 2015-06-12 NOTE — Assessment & Plan Note (Signed)
Blood pressure is well controlled on today's visit. No changes made to the medications. 

## 2015-06-12 NOTE — Assessment & Plan Note (Signed)
Currently not on anticoagulation. Was on warfarin Reports having bleeding or clot to his eye. Details unclear

## 2015-06-12 NOTE — Assessment & Plan Note (Signed)
Followed by pulmonary in [facility]

## 2015-06-12 NOTE — Assessment & Plan Note (Signed)
Currently takes chlorthalidone. Has not been taking Lasix. Not with him today and he reports he has not taken this for some time. We'll take the furosemide off his list

## 2015-06-16 ENCOUNTER — Other Ambulatory Visit: Payer: Self-pay | Admitting: Cardiovascular Disease

## 2015-07-01 DIAGNOSIS — Z79899 Other long term (current) drug therapy: Secondary | ICD-10-CM | POA: Diagnosis not present

## 2015-07-01 DIAGNOSIS — J449 Chronic obstructive pulmonary disease, unspecified: Secondary | ICD-10-CM | POA: Diagnosis not present

## 2015-07-01 DIAGNOSIS — E785 Hyperlipidemia, unspecified: Secondary | ICD-10-CM | POA: Diagnosis not present

## 2015-07-01 DIAGNOSIS — E039 Hypothyroidism, unspecified: Secondary | ICD-10-CM | POA: Diagnosis not present

## 2015-07-01 DIAGNOSIS — Z125 Encounter for screening for malignant neoplasm of prostate: Secondary | ICD-10-CM | POA: Diagnosis not present

## 2015-07-08 DIAGNOSIS — I2510 Atherosclerotic heart disease of native coronary artery without angina pectoris: Secondary | ICD-10-CM | POA: Diagnosis not present

## 2015-07-08 DIAGNOSIS — Z6827 Body mass index (BMI) 27.0-27.9, adult: Secondary | ICD-10-CM | POA: Diagnosis not present

## 2015-07-08 DIAGNOSIS — J449 Chronic obstructive pulmonary disease, unspecified: Secondary | ICD-10-CM | POA: Diagnosis not present

## 2015-07-08 DIAGNOSIS — Z23 Encounter for immunization: Secondary | ICD-10-CM | POA: Diagnosis not present

## 2015-07-08 DIAGNOSIS — D0220 Carcinoma in situ of unspecified bronchus and lung: Secondary | ICD-10-CM | POA: Diagnosis not present

## 2015-07-25 ENCOUNTER — Encounter (INDEPENDENT_AMBULATORY_CARE_PROVIDER_SITE_OTHER): Payer: Self-pay | Admitting: *Deleted

## 2015-08-12 ENCOUNTER — Other Ambulatory Visit: Payer: Self-pay | Admitting: Internal Medicine

## 2015-08-26 ENCOUNTER — Other Ambulatory Visit: Payer: Self-pay | Admitting: Family Medicine

## 2015-08-26 ENCOUNTER — Telehealth: Payer: Self-pay | Admitting: Internal Medicine

## 2015-08-26 MED ORDER — FLUTICASONE FUROATE-VILANTEROL 100-25 MCG/INH IN AEPB
1.0000 | INHALATION_SPRAY | Freq: Every day | RESPIRATORY_TRACT | Status: DC
Start: 2015-08-26 — End: 2015-12-13

## 2015-08-26 NOTE — Telephone Encounter (Signed)
Called spoke with spouse. Pt needed refill on breo. I have sent this in. Nothing further needed

## 2015-08-26 NOTE — Telephone Encounter (Signed)
Patient returned call and can be reached at [phone].  If he is out, he gives permission to speak with his wife.

## 2015-08-26 NOTE — Telephone Encounter (Signed)
[id number] x1 

## 2015-08-27 ENCOUNTER — Ambulatory Visit (INDEPENDENT_AMBULATORY_CARE_PROVIDER_SITE_OTHER): Payer: Medicare Other | Admitting: Internal Medicine

## 2015-08-27 ENCOUNTER — Other Ambulatory Visit (INDEPENDENT_AMBULATORY_CARE_PROVIDER_SITE_OTHER): Payer: Self-pay | Admitting: Internal Medicine

## 2015-08-27 ENCOUNTER — Encounter (INDEPENDENT_AMBULATORY_CARE_PROVIDER_SITE_OTHER): Payer: Self-pay | Admitting: *Deleted

## 2015-08-27 ENCOUNTER — Encounter (INDEPENDENT_AMBULATORY_CARE_PROVIDER_SITE_OTHER): Payer: Self-pay | Admitting: Internal Medicine

## 2015-08-27 VITALS — BP 134/62 | HR 76 | Temp 97.8°F | Ht 67.0 in | Wt 168.5 lb

## 2015-08-27 DIAGNOSIS — D126 Benign neoplasm of colon, unspecified: Secondary | ICD-10-CM

## 2015-08-27 DIAGNOSIS — D649 Anemia, unspecified: Secondary | ICD-10-CM | POA: Diagnosis not present

## 2015-08-27 DIAGNOSIS — D508 Other iron deficiency anemias: Secondary | ICD-10-CM

## 2015-08-27 NOTE — Patient Instructions (Signed)
Colonoscopy.  The risks and benefits such as perforation, bleeding, and infection were reviewed with the patient and is agreeable. 

## 2015-08-27 NOTE — Progress Notes (Signed)
Subjective:    Patient ID: [name], male    DOB: 12/23/1945, 69 y.o.   MRN: [id number]  HPI Referred by Dr. Willey [name] for anemia/colonoscopy. Last colonoscopy was in 2009 by Dr. Gala [name]. Hx of colonic adenoma back in 1998. He denies seeing any rectal bleeding. He says his stools are rarely solid.  Stools are usually soft and loose. Usually has a BM twice a day. No melena or BRRB. His appetite is good. He has lost 20 pounds over the past year which was intentional. No acid reflux. No abdominal pain.   07/21/2015 H and H 11.4 and 33.9, MCV 92. Hx of rt PE in 2015 and was maintained Warfarin. D/C in June of this year.   CBC Latest Ref Rng 03/02/2014 01/26/2014 12/16/2013  WBC 4.0 - 10.5 K/uL 10.3 10.7(H) 34.1(H)  Hemoglobin 13.0 - 17.0 g/dL 13.2 12.0(L) 12.0(L)  Hematocrit 39.0 - 52.0 % 39.6 36.1(L) 37.6(L)  Platelets 150.0 - 400.0 K/uL 322.0 321.0 432      07/10/2008 Dr [name]: Colonoscopy:   INDICATIONS FOR PROCEDURE: A 69 year old gentleman with history of colonic adenoma back in 1998. His last exam was by Dr. Melony [name] in 2003, was found to have diverticulosis and diminutive rectosigmoid polyps which were ablated. He has no lower GI symptoms currently. He is here for surveillance. Risks, benefits, alternatives, and limitations have been reviewed, questions answered, is agreeable. Please see documentation in the medical record.  IMPRESSION: 1. Normal rectum. 2. Left-sided diverticulum and colonic mucosa appeared normal.   Review of Systems Past Medical History  Diagnosis Date  . Dyspnea   . COPD (chronic obstructive pulmonary disease) ([place])   . Small cell carcinoma of lung ([place])   . Spontaneous pneumothorax   . Arthritis of lumbar spine   . Collapsed lung     x3  . Asthma   . Pneumothorax   . History of benign thyroid tumor   . Lung cancer ([place])   . Acute MI ([place])   . Clotting disorder ([place])   . Pneumonia     Past Surgical History  Procedure  Laterality Date  . Left upper lobectomy for small cell lung cancer    . Chest tube insertion    . Vats r thoracotomy    . Benign thryoid nodule resected    . Appendectomy    . Cardiac catheterization  2013    [name]    Allergies  Allergen Reactions  . Other Other (See Comments)    Hydromet cough syrup-causes GI upset  . Codeine Nausea And Vomiting    Current Outpatient Prescriptions on File Prior to Visit  Medication Sig Dispense Refill  . acetaminophen (TYLENOL) 325 MG tablet Take 650 mg by mouth as needed.      Marland Kitchen albuterol (PROAIR HFA) 108 (90 BASE) MCG/ACT inhaler 2 puffs every 4 hours if needed- rescue 1 Inhaler 11  . aspirin 81 MG EC tablet Take 81 mg by mouth daily. Swallow whole.    Marland Kitchen atorvastatin (LIPITOR) 10 MG tablet TAKE 1 TABLET BY MOUTH EVERY DAY 90 tablet 3  . chlorthalidone (HYGROTON) 25 MG tablet Take 25 mg by mouth daily.    . Fluticasone Furoate-Vilanterol (BREO ELLIPTA) 100-25 MCG/INH AEPB Inhale 1 puff into the lungs daily. 60 each 3  . guaiFENesin (MUCINEX) 600 MG 12 hr tablet Take 600 mg by mouth daily.     Marland Kitchen losartan (COZAAR) 100 MG tablet Take 100 mg by mouth daily.  3  . predniSONE (DELTASONE) 5  MG tablet Take 2 tablets (10 mg total) by mouth daily. 60 tablet 12  . traMADol (ULTRAM) 50 MG tablet Takes 2 tablets 4 times daily as needed for pain.     No current facility-administered medications on file prior to visit.        Objective:   Physical ExamBlood pressure 134/62, pulse 76, temperature 97.8 F (36.6 C), height '5\' 7"'$  (1.702 m), weight 168 lb 8 oz (76.431 kg).  Alert and oriented. Skin warm and dry. Oral mucosa is moist.   . Sclera anicteric, conjunctivae is pink. Thyroid not enlarged. No cervical lymphadenopathy. Lungs clear. Heart regular rate and rhythm.  Abdomen is soft. Bowel sounds are positive. No hepatomegaly. No abdominal masses felt. No tenderness.  No edema to lower extremities.         Assessment & Plan:  Hx of colonic adenoma. Last  colonoscopy in 2009. Needs surveillance.  Hemoglobin slightly low. Colonic neoplasm needs to be ruled out.  Colonoscopy. The risks and benefits such as perforation, bleeding, and infection were reviewed with the patient and is agreeable.

## 2015-08-30 ENCOUNTER — Ambulatory Visit (HOSPITAL_COMMUNITY)
Admission: RE | Admit: 2015-08-30 | Discharge: 2015-08-30 | Disposition: A | Discharge status: Home / Self Care | Payer: Medicare Other | Source: Ambulatory Visit | Attending: Internal Medicine | Admitting: Internal Medicine

## 2015-08-30 ENCOUNTER — Encounter (HOSPITAL_COMMUNITY)
Admission: RE | Disposition: A | Discharge status: Home / Self Care | Payer: Self-pay | Source: Ambulatory Visit | Attending: Internal Medicine

## 2015-08-30 ENCOUNTER — Encounter (HOSPITAL_COMMUNITY): Payer: Self-pay | Admitting: *Deleted

## 2015-08-30 DIAGNOSIS — Z7952 Long term (current) use of systemic steroids: Secondary | ICD-10-CM | POA: Insufficient documentation

## 2015-08-30 DIAGNOSIS — Z7982 Long term (current) use of aspirin: Secondary | ICD-10-CM | POA: Diagnosis not present

## 2015-08-30 DIAGNOSIS — K5730 Diverticulosis of large intestine without perforation or abscess without bleeding: Secondary | ICD-10-CM | POA: Insufficient documentation

## 2015-08-30 DIAGNOSIS — Z1211 Encounter for screening for malignant neoplasm of colon: Secondary | ICD-10-CM | POA: Diagnosis not present

## 2015-08-30 DIAGNOSIS — Z86010 Personal history of colonic polyps: Secondary | ICD-10-CM | POA: Diagnosis not present

## 2015-08-30 DIAGNOSIS — Z800 Family history of malignant neoplasm of digestive organs: Secondary | ICD-10-CM | POA: Diagnosis not present

## 2015-08-30 DIAGNOSIS — D126 Benign neoplasm of colon, unspecified: Secondary | ICD-10-CM

## 2015-08-30 DIAGNOSIS — K648 Other hemorrhoids: Secondary | ICD-10-CM | POA: Diagnosis not present

## 2015-08-30 DIAGNOSIS — Z85118 Personal history of other malignant neoplasm of bronchus and lung: Secondary | ICD-10-CM | POA: Insufficient documentation

## 2015-08-30 DIAGNOSIS — D508 Other iron deficiency anemias: Secondary | ICD-10-CM

## 2015-08-30 DIAGNOSIS — K644 Residual hemorrhoidal skin tags: Secondary | ICD-10-CM | POA: Diagnosis not present

## 2015-08-30 DIAGNOSIS — J449 Chronic obstructive pulmonary disease, unspecified: Secondary | ICD-10-CM | POA: Insufficient documentation

## 2015-08-30 HISTORY — PX: COLONOSCOPY: SHX5424

## 2015-08-30 SURGERY — COLONOSCOPY
Anesthesia: Moderate Sedation

## 2015-08-30 MED ORDER — STERILE WATER FOR IRRIGATION IR SOLN
Status: DC | PRN
  Administered 2015-08-30: 15:00:00

## 2015-08-30 MED ORDER — MIDAZOLAM HCL 5 MG/5ML IJ SOLN
INTRAMUSCULAR | Status: AC
  Filled 2015-08-30: qty 10

## 2015-08-30 MED ORDER — MIDAZOLAM HCL 5 MG/5ML IJ SOLN
INTRAMUSCULAR | Status: DC | PRN
  Administered 2015-08-30 (×2): 2 mg via INTRAVENOUS
  Administered 2015-08-30: 1 mg via INTRAVENOUS

## 2015-08-30 MED ORDER — SODIUM CHLORIDE 0.9 % IV SOLN
INTRAVENOUS | Status: DC
  Administered 2015-08-30: 1000 mL via INTRAVENOUS

## 2015-08-30 MED ORDER — MEPERIDINE HCL 50 MG/ML IJ SOLN
INTRAMUSCULAR | Status: AC
  Filled 2015-08-30: qty 1

## 2015-08-30 MED ORDER — MEPERIDINE HCL 50 MG/ML IJ SOLN
INTRAMUSCULAR | Status: DC | PRN
  Administered 2015-08-30 (×2): 25 mg via INTRAVENOUS

## 2015-08-30 NOTE — Discharge Instructions (Signed)
Resume usual medications and high fiber diet.  No driving for 24 hours.  Can wait 7 years before next colonoscopy.   Colonoscopy, Care After These instructions give you information on caring for yourself after your procedure. Your doctor may also give you more specific instructions. Call your doctor if you have any problems or questions after your procedure. HOME CARE  Do not drive for 24 hours.  Do not sign important papers or use machinery for 24 hours.  You may shower.  You may go back to your usual activities, but go slower for the first 24 hours.  Take rest breaks often during the first 24 hours.  Walk around or use warm packs on your belly (abdomen) if you have belly cramping or gas.  Drink enough fluids to keep your pee (urine) clear or pale yellow.  Resume your normal diet. Avoid heavy or fried foods.  Avoid drinking alcohol for 24 hours or as told by your doctor.  Only take medicines as told by your doctor. If a tissue sample (biopsy) was taken during the procedure:   Do not take aspirin or blood thinners for 7 days, or as told by your doctor.  Do not drink alcohol for 7 days, or as told by your doctor.  Eat soft foods for the first 24 hours. GET HELP IF: You still have a small amount of blood in your poop (stool) 2-3 days after the procedure. GET HELP RIGHT AWAY IF:  You have more than a small amount of blood in your poop.  You see clumps of tissue (blood clots) in your poop.  Your belly is puffy (swollen).  You feel sick to your stomach (nauseous) or throw up (vomit).  You have a fever.  You have belly pain that gets worse and medicine does not help. MAKE SURE YOU:  Understand these instructions.  Will watch your condition.  Will get help right away if you are not doing well or get worse.   This information is not intended to replace advice given to you by your health care provider. Make sure you discuss any questions you have with your health  care provider.   Document Released: 10/17/2010 Document Revised: 09/19/2013 Document Reviewed: 05/22/2013 Elsevier Interactive Patient Education 2016 Elsevier Inc. High-Fiber Diet Fiber, also called dietary fiber, is a type of carbohydrate found in fruits, vegetables, whole grains, and beans. A high-fiber diet can have many health benefits. Your health care provider may recommend a high-fiber diet to help:  Prevent constipation. Fiber can make your bowel movements more regular.  Lower your cholesterol.  Relieve hemorrhoids, uncomplicated diverticulosis, or irritable bowel syndrome.  Prevent overeating as part of a weight-loss plan.  Prevent heart disease, type 2 diabetes, and certain cancers. WHAT IS MY PLAN? The recommended daily intake of fiber includes:  38 grams for men under age 32.  17 grams for men over age 90.  70 grams for women under age 1.  54 grams for women over age 12. You can get the recommended daily intake of dietary fiber by eating a variety of fruits, vegetables, grains, and beans. Your health care provider may also recommend a fiber supplement if it is not possible to get enough fiber through your diet. WHAT DO I NEED TO KNOW ABOUT A HIGH-FIBER DIET?  Fiber supplements have not been widely studied for their effectiveness, so it is better to get fiber through food sources.  Always check the fiber content on thenutrition facts label of any prepackaged food.  Look for foods that contain at least 5 grams of fiber per serving.  Ask your dietitian if you have questions about specific foods that are related to your condition, especially if those foods are not listed in the following section.  Increase your daily fiber consumption gradually. Increasing your intake of dietary fiber too quickly may cause bloating, cramping, or gas.  Drink plenty of water. Water helps you to digest fiber. WHAT FOODS CAN I EAT? Grains Whole-grain breads. Multigrain cereal. Oats and  oatmeal. Brown rice. Barley. Bulgur wheat. [place]. Bran muffins. Popcorn. Rye wafer crackers. Vegetables Sweet potatoes. Spinach. Kale. Artichokes. Cabbage. Broccoli. Green peas. Carrots. Squash. Fruits Berries. Pears. Apples. Oranges. Avocados. Prunes and raisins. Dried figs. Meats and Other Protein Sources [place], kidney, pinto, and soy beans. Split peas. Lentils. Nuts and seeds. Dairy Fiber-fortified yogurt. Beverages Fiber-fortified soy milk. Fiber-fortified orange juice. Other Fiber bars. The items listed above may not be a complete list of recommended foods or beverages. Contact your dietitian for more options. WHAT FOODS ARE NOT RECOMMENDED? Grains White bread. Pasta made with refined flour. White rice. Vegetables Fried potatoes. Canned vegetables. Well-cooked vegetables.  Fruits Fruit juice. Cooked, strained fruit. Meats and Other Protein Sources Fatty cuts of meat. Fried Sales executive or fried fish. Dairy Milk. Yogurt. Cream cheese. Sour cream. Beverages Soft drinks. Other Cakes and pastries. Butter and oils. The items listed above may not be a complete list of foods and beverages to avoid. Contact your dietitian for more information. WHAT ARE SOME TIPS FOR INCLUDING HIGH-FIBER FOODS IN MY DIET?  Eat a wide variety of high-fiber foods.  Make sure that half of all grains consumed each day are whole grains.  Replace breads and cereals made from refined flour or white flour with whole-grain breads and cereals.  Replace white rice with brown rice, bulgur wheat, or millet.  Start the day with a breakfast that is high in fiber, such as a cereal that contains at least 5 grams of fiber per serving.  Use beans in place of meat in soups, salads, or pasta.  Eat high-fiber snacks, such as berries, raw vegetables, nuts, or popcorn.   This information is not intended to replace advice given to you by your health care provider. Make sure you discuss any questions you have with your  health care provider.   Document Released: 09/14/2005 Document Revised: 10/05/2014 Document Reviewed: 02/27/2014 Elsevier Interactive Patient Education [employer].

## 2015-08-30 NOTE — Op Note (Signed)
COLONOSCOPY PROCEDURE REPORT  PATIENT:  [name]  MR#:  [id number] Birthdate:  08/20/1946, 69 y.o., male Endoscopist:  Dr. Rogene Houston, MD Referred By:  Dr.  Asencion Noble, MD Procedure Date: 08/30/2015  Procedure:   Colonoscopy  Indications:   Patient is 69 year old Caucasian male was history of colonic adenoma found on colonoscopy of 1998. Last colonoscopy was in 2009 and no polyps were found.  He was receiving noted to be in the neck stool is guaiac-negative any denies melena or rectal bleeding. He tells me that his father had colon carcinoma at late onset and of to be 57.  Informed Consent:  The procedure and risks were reviewed with the patient and informed consent was obtained.  Medications:  Demerol 50 mg IV Versed 5 mg IV  Description of procedure:  After a digital rectal exam was performed, that colonoscope was advanced from the anus through the rectum and colon to the area of the cecum, ileocecal valve and appendiceal orifice. The cecum was deeply intubated. These structures were well-seen and photographed for the record. From the level of the cecum and ileocecal valve, the scope was slowly and cautiously withdrawn. The mucosal surfaces were carefully surveyed utilizing scope tip to flexion to facilitate fold flattening as needed. The scope was pulled down into the rectum where a thorough exam including retroflexion was performed.  Findings:   Prep satisfactory. Normal mucosa of cecum, ascending colon, hepatic flexure, transverse colon, splenic flexure and descending colon. Scattered diverticula noted at sigmoid colon. Normal rectal mucosa. Small hemorrhoids below the dentate line.   Therapeutic/Diagnostic Maneuvers Performed:   None  Complications:   none  EBL: none  Cecal Withdrawal Time:  8 minutes  Impression:   Examination performed to cecum.  No evidence of recurrent polyps.  Mild sigmoid colon diverticulosis.  Small external hemorrhoids  Recommendations:   Standard instructions given. May wait 7 years before next colonoscopy.  Joeline Freer U  08/30/2015 3:04 PM  CC: Dr. Asencion Noble, MD & Dr. Rayne Du ref. provider found

## 2015-08-30 NOTE — H&P (Signed)
[name] is an 69 y.o. male.   Chief Complaint:  Patient is here for colonoscopy. HPI:  Patient is 69 year old Caucasian male with history of colonic adenoma and is here for surveillance colonoscopy. His last colonoscopy was in 2019 and no polyps were found. He denies melena or rectal bleeding abdominal pain.  He was noted to be anemic by Dr. Willey Blade but stool guaiac negative. He has lost 20 pounds is here voluntarily by dieting. He was told that his father had colon carcinoma at late onset. He lived to be 51.    Past Medical History  Diagnosis Date  . Dyspnea   . COPD (chronic obstructive pulmonary disease) ([place])   . Small cell carcinoma of lung ([place])   . Spontaneous pneumothorax   . Arthritis of lumbar spine   . Collapsed lung     x3  . Asthma   . Pneumothorax   . History of benign thyroid tumor   . Lung cancer ([place])   . Acute MI ([place])   . Clotting disorder ([place])   . Pneumonia     Past Surgical History  Procedure Laterality Date  . Left upper lobectomy for small cell lung cancer    . Chest tube insertion    . Vats r thoracotomy    . Benign thryoid nodule resected    . Appendectomy    . Cardiac catheterization  2013    [facility]    Family History  Problem Relation Age of Onset  . Heart disease Mother   . Colon cancer Father   . Heart failure Brother    Social History:  reports that he has quit smoking. His smoking use included Cigars. He does not have any smokeless tobacco history on file. He reports that he does not drink alcohol or use illicit drugs.  Allergies:  Allergies  Allergen Reactions  . Other Other (See Comments)    Hydromet cough syrup-causes GI upset  . Codeine Nausea And Vomiting    Medications Prior to Admission  Medication Sig Dispense Refill  . acetaminophen (TYLENOL) 325 MG tablet Take 650 mg by mouth as needed.      Marland Kitchen albuterol (PROAIR HFA) 108 (90 BASE) MCG/ACT inhaler 2 puffs every 4 hours if needed- rescue 1 Inhaler 11  . aspirin 81 MG EC  tablet Take 81 mg by mouth daily. Swallow whole.    Marland Kitchen atorvastatin (LIPITOR) 10 MG tablet TAKE 1 TABLET BY MOUTH EVERY DAY 90 tablet 3  . chlorthalidone (HYGROTON) 25 MG tablet Take 25 mg by mouth daily.    . Fluticasone Furoate-Vilanterol (BREO ELLIPTA) 100-25 MCG/INH AEPB Inhale 1 puff into the lungs daily. 60 each 3  . guaiFENesin (MUCINEX) 600 MG 12 hr tablet Take 600 mg by mouth daily.     Marland Kitchen losartan (COZAAR) 100 MG tablet Take 100 mg by mouth daily.  3  . predniSONE (DELTASONE) 5 MG tablet Take 2 tablets (10 mg total) by mouth daily. 60 tablet 12  . traMADol (ULTRAM) 50 MG tablet Takes 2 tablets 4 times daily as needed for pain.      No results found for this or any previous visit (from the past 48 hour(s)). No results found.  ROS  Blood pressure 162/80, pulse 68, temperature 97.9 F (36.6 C), temperature source Oral, resp. rate 13, height '5\' 7"'$  (1.702 m), weight 168 lb (76.204 kg), SpO2 100 %. Physical Exam  Constitutional: He appears well-developed and well-nourished.  HENT:  Mouth/Throat: Oropharynx is clear and moist.  Eyes: Conjunctivae are normal. No scleral icterus.  Neck: No thyromegaly present.  Cardiovascular: Normal rate, regular rhythm and normal heart sounds.   No murmur heard. Respiratory: Effort normal and breath sounds normal.  GI: Soft. He exhibits no distension and no mass. There is no tenderness.  Musculoskeletal: He exhibits no edema.  Lymphadenopathy:    He has no cervical adenopathy.  Neurological: He is alert.  Skin: Skin is warm and dry.     Assessment/Plan  History of colonic adenoma.  surveillance colonoscopy.  Katherleen Folkes U 08/30/2015, 2:32 PM

## 2015-09-02 ENCOUNTER — Ambulatory Visit (INDEPENDENT_AMBULATORY_CARE_PROVIDER_SITE_OTHER): Payer: Medicare Other | Admitting: Internal Medicine

## 2015-09-02 ENCOUNTER — Encounter: Payer: Self-pay | Admitting: Internal Medicine

## 2015-09-02 VITALS — BP 122/74 | HR 74 | Ht 67.0 in | Wt 168.6 lb

## 2015-09-02 DIAGNOSIS — I2699 Other pulmonary embolism without acute cor pulmonale: Secondary | ICD-10-CM | POA: Diagnosis not present

## 2015-09-02 DIAGNOSIS — J449 Chronic obstructive pulmonary disease, unspecified: Secondary | ICD-10-CM | POA: Diagnosis not present

## 2015-09-02 MED ORDER — FLUTICASONE FUROATE-VILANTEROL 100-25 MCG/INH IN AEPB: 1.0000 | INHALATION_SPRAY | Freq: Every day | RESPIRATORY_TRACT | Status: DC

## 2015-09-02 NOTE — Progress Notes (Signed)
Patient ID: [name], male    DOB: November 29, 1945, 69 y.o.   MRN: [id number]  HPI 06/08/11- 69 year old male former smoker followed for COPD, history of small cell cancer, history of pneumothorax.   PCP is Dr Asencion Noble Last here December 03, 2010- Since then had routine physical w/ Dr Willey Blade- okay except for the breathing part. He did use stand-by prednisone 3 months ago on trip to [place] and would like to keep some on hand.  He is okay today, asking about pertussis vaccine and wants flu vax. Last tetanus about 3 years ago- discussed. Has had more than one pneumovax. Had shingles vaccine.  Expects wheeze and dyspnea with exertion- unchanged. Spiriva didn't help- made him worse.   12/07/11-06/08/11- 69 year old male former smoker followed for COPD, history of small cell cancer, history of pneumothorax.   PCP is Dr Asencion Noble Did well through the winter until he caught a cold last week. Coughing clear mucus. Denies fever or purulent sputum. Feels he just needs more time to clear this. Used up last standby prescription for prednisone one month ago. Discussed change of delivery device for Combivent. Advair 250 works as well as Advair 500.  10/13/13- 69 year old male former smoker followed for COPD, history of small cell cancer, history of pneumothorax.  FOLLOWS TIR:[id number] about the same since last visit; continues to use Advair with success. Prednisone alternating 10 mg with 5 mg every other day. CXR 12/14/12 IMPRESSION: Underlying emphysema. Postoperative change in the  left. Bibasilar scarring. No edema or consolidation. Essentially  stable chest appearance compared to prior study.  Original Report Authenticated By: Lowella Grip, M.D.   12/22/2013 [facility] follow up --69 year old male former smoker followed for COPD, history of small cell cancer, history of pneumothorax. Patient returns for a post hospital followup. He was admitted at [facility] on March 16 and found  to have an acute pulmonary embolus. CT chest showed pulmonary embolus involving the right middle lobe and posterior, right lower lobe pulmonary arteries. Patchy peripheral consolidation in the right middle lobe and right base, consistent with infarct. Says he developed worsening dyspnea after recent URI that prompted the ER visit. Also had a DVT in right lower leg.  Pt was also tx for possible PNA on left. Was tx w/ abx, says he finished them yesterday.  Started on Xarelto .  Records requested from [facility].  Since discharge he is feeling some better, but gets winded easily . Still weak.  Can walk on flat surfaces ok , incline makes it harder.  No chest pain, hemopytsis, leg swelling, n/v/d., orthopnea.  Remains on Advair and prednisone 5mg  alt 10mg  .   01/26/14- 69 year old male former smoker followed for COPD, history of small cell cancer, history of pneumothorax, hx DVT/PE/Xarelto FOLLOWS FOR:  CXR done today-Still having SOB with exertion, tightness in chest and some  cough with clear mucus CT chest done 12/11/2013 [facility] noted enlarged paratracheal and hilar nodes, as well as diagnosed pulmonary emboli. Clot was in the right calf based on Doppler. He is concerned about cost of Xarelto.still notes easy dyspnea on exertion some days. He feels better on the days he takes prednisone 10 mg rather than 5, asking to stay on 10 mg daily. CXR 01/26/14 IMPRESSION:  Improved opacities throughout the left lung with volume loss.  Baseline has not been achieved yet compared with January 2015.  Continued followup is warranted.  Electronically Signed  By: Maryclare Bean M.D.  On: 01/26/2014 09:17  03/02/14-  69 year old male former smoker followed for COPD, history of small cell cancer, history of pneumothorax, hx DVT/PE/Xarelto(December 10, 2013) FOLLOWS FOR:  Breathing improved since last OV Little cough, no chest pain. Can now walk 1 mile daily w/o stopping, on room air.  Xarelto is too expensive, unsustainable.  Discussed change to coumadin. Easy bruise on xarelto but no overt bleed.   07/02/14- 69 year old male former smoker followed for COPD, history of small cell cancer, history of pneumothorax, hx DVT/PE/warfarin(December 10, 2013) FOLLOWS FOR: continues to cough up green colored mucus-recently had cold. Pt denies any wheezing, SOB, fever, or chills. Recent chest cold clearing very slowly with residual cough and some green sputum. No fever, blood or chest pain. Converted to warfarin because Xarelto was too expensive. Managed by Coumadin clinic. No bleeding.  10/02/14- 69 year old male former smoker followed for COPD, history of small cell cancer, history of pneumothorax, hx DVT/PE/warfarin(December 10, 2013) FOLLOWS FOR: Pt c/o sneezing, runny nose, wheezing and productive cough with yellow mucus at times. Denies fever. Using cough drops PRN cough. Pt reports having a recent subconjunctival hemorrhage with high BPs  CXR 03/02/14 IMPRESSION: Postsurgical changes the left hemithorax. No evidence of acute cardiopulmonary disease. Electronically Signed  By: Julian Hy M.D.  On: 03/02/2014 12:59  10/24/2014 69 year old male former smoker followed for COPD, history of small cell cancer, history of pneumothorax, hx DVT/PE/warfarin(December 10, 2013) Pt presents for an acute office visit.  Complains of increased SOB, wheezing, tightness in chest, prod cough with clear mucus worse x2 days.   Denies f/c/s, n/v/d, hemoptysis, chest pain, orthopnea, or leg swelling Had URI /bronchitis 3 weeks ago, took Augmentin . Felt better until this week.  >>doxycycline /pred taper   11/21/2014 ER follow up : 69 year old male former smoker followed for COPD, history of small cell cancer s/p resection (2000) , history of pneumothorax, hx DVT/PE/warfarin(December 10, 2013) Complains of 1 week with productive cough with blood tinged mucus. No frank hemoptysis .  Thick mucus, dypspnea and wheezing.  Seen 1 month ago with COPD  flare , tx w/ abx and steroids .  CXR 10/24/14 showed chronic changes.  On coumadin for PE -INR 2/23 2.2  CXR in ER at [facility] noted a PNA yesterday, report says ?Left basilar infiltrate.  started on Levaquin 750mg  daily and pred taper.  Feels weak. No dizziness , chest pain, n/vd/. No hemoptysis or blood tinged mucus today.  CT chest 01/2014 with COPD changes , stable nodules , improved aeration.  Appetite is good. CXR 10/24/2014  Independently reviewed this image Bilateral pleural parenchymal scarring. Postsurgical changes left lung. No acute cardiopulmonary disease Report CXR 11/20/14 [facility]  ?left basilar infiltrate   12/12/14-69 year old male former smoker followed for COPD, history of small cell cancer, history of pneumothorax, hx DVT/PE/warfarin(December 10, 2013) FOLLOWS FOR: follow up per TP due to PNA-CXR done today as well. Pt continues to have SOB as before but starting to feel slightly better. Coughing clear phlegm. On prednisone 5 mg twice daily, his feet have begun to swell. CXR 12/12/14 (today) IMPRESSION: COPD changes with scattered scarring bilaterally greater on LEFT. No definite acute abnormalities. Electronically Signed By: Lavonia Dana M.D. On: 12/12/2014 10:49  12/26/14- 69 year old male former smoker followed for COPD, history of small cell cancer, history of pneumothorax, hx DVT/PE/warfarin(December 10, 2013) FOLLOWS FOR: fullness in chest and head; swelling in feet. Could not tell a big difference with using Advair 500 vs Advair 250. Cough productive yellow. Still feels he is retaining fluid. Continues  prednisone 5 mg twice daily.  01/29/15- 69 year old male former smoker followed for COPD, history of small cell cancer, history of pneumothorax, hx DVT/PE/warfarin(December 10, 2013) FOLLOWS FOR:Sob improved over 2-3 mths.,occass. wheezing,nasal congestion,bilat. edema ankles and calves,no fcs,occass. midchest tightness Says he is breathing better with no acute events. Seldom uses  rescue inhaler now. Feet still swell some since pneumonia in January and he shows rash on leg which looks like early stasis dermatitis. Coumadin has been managed by his cardiologist in [place]. Maintenance prednisone is now at 5 mg daily  04/30/15-69 year old male former smoker followed for COPD, history of small cell cancer, history of pneumothorax, hx DVT/PE/warfarin(March 15, 65) Follows For: SOB has improved. Currently using Advair. Denies any coughing or wheezing.  Disliked in oral and went back to Advair which is expensive. Continues prednisone 10 mg daily, finding 5 mg was not enough. CXR 12/12/14 IMPRESSION: COPD changes with scattered scarring bilaterally greater on LEFT. No definite acute abnormalities. Electronically Signed  By: Lavonia Dana M.D.  On: 12/12/2014 10:49  09/02/15- 69 year old male former smoker followed for COPD, history of small cell cancer, history of pneumothorax, hx DVT/PE/warfarin(December 10, 2013) FOLLOWS FOR: Pt has decided to continue using Breo as he feels this works better than the Advair. Denies acute exacerbation. Tolerated colonoscopy well. Continues maintenance prednisone 10 mg daily.  ROS-see HPI   Negative unless "+" Constitutional:    weight loss, night sweats, fevers, chills, fatigue, lassitude. HEENT:    headaches, difficulty swallowing, tooth/dental problems, sore throat,       sneezing, itching, ear ache, +nasal congestion, post nasal drip, snoring CV:    chest pain, orthopnea, PND, +swelling in lower extremities, anasarca,                                                     dizziness, palpitations Resp:   +shortness of breath with exertion or at rest.                productive cough,   non-productive cough, coughing up of blood.              change in color of mucus.  wheezing.   Skin:    rash or lesions. GI:  No-   heartburn, indigestion, abdominal pain, nausea, vomiting,  GU: . MS:   joint pain, stiffness,  Neuro-     nothing  unusual Psych:  change in mood or affect.  depression or anxiety.   memory loss.  OBJ- Physical Exam General- Alert, Oriented, Affect-appropriate, Distress- none acute Skin- rash-none, lesions- none, excoriation- none Lymphadenopathy- none Head- atraumatic            Eyes- Gross vision intact, PERRLA, conjunctivae and secretions clear            Ears- Hearing, canals-normal            Nose- Clear, no-Septal dev, mucus, polyps, erosion, perforation             Throat- Mallampati II , mucosa clear , drainage- none, tonsils- atrophic Neck- flexible , trachea midline, no stridor , thyroid nl, carotid no bruit Chest - symmetrical excursion , unlabored           Heart/CV- RRR , no murmur , no gallop  , no rub, nl s1 s2                           -  JVD- none , edema-none stasis changes- none, varices+           Lung- + few crackles right base again noted, wheeze- none, cough-none, dullness-none, rub- none           Chest wall-  Abd-  Br/ Gen/ Rectal- Not done, not indicated Extrem- cyanosis- none, clubbing, none, atrophy- none, strength- nl, + superficial varices Neuro- grossly intact to observation

## 2015-09-02 NOTE — Patient Instructions (Signed)
Prescription sent refilling Adair Patter  We can continue current meds.  Please call if we can help

## 2015-09-05 NOTE — Assessment & Plan Note (Signed)
Completed anticoagulation. Reminded to move legs and avoid stasis

## 2015-09-05 NOTE — Assessment & Plan Note (Signed)
Persistent crackles right base but overall he has been stable through the summer and fall so far. Medications reviewed. Does best with low-dose maintenance prednisone

## 2015-11-11 DIAGNOSIS — I781 Nevus, non-neoplastic: Secondary | ICD-10-CM | POA: Diagnosis not present

## 2015-11-11 DIAGNOSIS — Z6829 Body mass index (BMI) 29.0-29.9, adult: Secondary | ICD-10-CM | POA: Diagnosis not present

## 2015-11-11 DIAGNOSIS — J449 Chronic obstructive pulmonary disease, unspecified: Secondary | ICD-10-CM | POA: Diagnosis not present

## 2015-11-14 DIAGNOSIS — D045 Carcinoma in situ of skin of trunk: Secondary | ICD-10-CM | POA: Diagnosis not present

## 2015-11-14 DIAGNOSIS — D0359 Melanoma in situ of other part of trunk: Secondary | ICD-10-CM | POA: Diagnosis not present

## 2015-11-14 DIAGNOSIS — D225 Melanocytic nevi of trunk: Secondary | ICD-10-CM | POA: Diagnosis not present

## 2015-11-25 DIAGNOSIS — D0359 Melanoma in situ of other part of trunk: Secondary | ICD-10-CM | POA: Diagnosis not present

## 2015-11-25 DIAGNOSIS — L089 Local infection of the skin and subcutaneous tissue, unspecified: Secondary | ICD-10-CM | POA: Diagnosis not present

## 2015-12-13 ENCOUNTER — Other Ambulatory Visit: Payer: Self-pay | Admitting: Internal Medicine

## 2016-02-17 DIAGNOSIS — Z1283 Encounter for screening for malignant neoplasm of skin: Secondary | ICD-10-CM | POA: Diagnosis not present

## 2016-02-17 DIAGNOSIS — Z08 Encounter for follow-up examination after completed treatment for malignant neoplasm: Secondary | ICD-10-CM | POA: Diagnosis not present

## 2016-02-17 DIAGNOSIS — X32XXXA Exposure to sunlight, initial encounter: Secondary | ICD-10-CM | POA: Diagnosis not present

## 2016-02-17 DIAGNOSIS — Z85820 Personal history of malignant melanoma of skin: Secondary | ICD-10-CM | POA: Diagnosis not present

## 2016-02-17 DIAGNOSIS — L570 Actinic keratosis: Secondary | ICD-10-CM | POA: Diagnosis not present

## 2016-03-02 ENCOUNTER — Encounter: Payer: Self-pay | Admitting: Internal Medicine

## 2016-03-02 ENCOUNTER — Ambulatory Visit (INDEPENDENT_AMBULATORY_CARE_PROVIDER_SITE_OTHER): Payer: Medicare Other | Admitting: Internal Medicine

## 2016-03-02 VITALS — BP 124/70 | HR 89 | Ht 67.0 in | Wt 172.0 lb

## 2016-03-02 DIAGNOSIS — J449 Chronic obstructive pulmonary disease, unspecified: Secondary | ICD-10-CM | POA: Diagnosis not present

## 2016-03-02 DIAGNOSIS — J930 Spontaneous tension pneumothorax: Secondary | ICD-10-CM

## 2016-03-02 MED ORDER — ALBUTEROL SULFATE HFA 108 (90 BASE) MCG/ACT IN AERS: 2.0000 | INHALATION_SPRAY | Freq: Four times a day (QID) | RESPIRATORY_TRACT | Status: AC | PRN

## 2016-03-02 MED ORDER — PREDNISONE 5 MG PO TABS: ORAL_TABLET | ORAL | Status: DC

## 2016-03-02 MED ORDER — FLUTICASONE FUROATE-VILANTEROL 200-25 MCG/INH IN AEPB: 1.0000 | INHALATION_SPRAY | Freq: Every day | RESPIRATORY_TRACT | Status: DC

## 2016-03-02 NOTE — Patient Instructions (Signed)
Script sent to refill prednisone, continuing 20 mg daily for now  Sample Breo 200    Inhale 1 puff then rinse mouth, once daily    When sample runs out, return to your Breo 100  Please call as needed

## 2016-03-02 NOTE — Progress Notes (Addendum)
Patient ID: [name], male    DOB: [birth year], 70 y.o.   MRN: [id number]  HPI   10/24/2014 70 year old male former smoker followed for COPD, history of small cell cancer, history of pneumothorax, hx DVT/PE/warfarin(December 10, 2013) Pt presents for an acute office visit.  Complains of increased SOB, wheezing, tightness in chest, prod cough with clear mucus worse x2 days.   Denies f/c/s, n/v/d, hemoptysis, chest pain, orthopnea, or leg swelling Had URI /bronchitis 3 weeks ago, took Augmentin . Felt better until this week.  >>doxycycline /pred taper   11/21/2014 ER follow up : 70 year old male former smoker followed for COPD, history of small cell cancer s/p resection (2000) , history of pneumothorax, hx DVT/PE/warfarin(December 10, 2013) Complains of 1 week with productive cough with blood tinged mucus. No frank hemoptysis .  Thick mucus, dypspnea and wheezing.  Seen 1 month ago with COPD flare , tx w/ abx and steroids .  CXR 10/24/14 showed chronic changes.  On coumadin for PE -INR 2/23 2.2  CXR in ER at [facility] noted a PNA yesterday, report says ?Left basilar infiltrate.  started on Levaquin '750mg'$  daily and pred taper.  Feels weak. No dizziness , chest pain, n/vd/. No hemoptysis or blood tinged mucus today.  CT chest 01/2014 with COPD changes , stable nodules , improved aeration.  Appetite is good. CXR 10/24/2014  Independently reviewed this image Bilateral pleural parenchymal scarring. Postsurgical changes left lung. No acute cardiopulmonary disease Report CXR 11/20/14 [facility]  ?left basilar infiltrate   12/12/14-70 year old male former smoker followed for COPD, history of small cell cancer, history of pneumothorax, hx DVT/PE/warfarin(December 10, 2013) FOLLOWS FOR: follow up per TP due to PNA-CXR done today as well. Pt continues to have SOB as before but starting to feel slightly better. Coughing clear phlegm. On prednisone 5 mg twice daily, his feet have begun to swell. CXR 12/12/14  (today) IMPRESSION: COPD changes with scattered scarring bilaterally greater on LEFT. No definite acute abnormalities. Electronically Signed By: Lavonia Dana M.D. On: 12/12/2014 10:49  12/26/14- 70 year old male former smoker followed for COPD, history of small cell cancer, history of pneumothorax, hx DVT/PE/warfarin(December 10, 2013) FOLLOWS FOR: fullness in chest and head; swelling in feet. Could not tell a big difference with using Advair 500 vs Advair 250. Cough productive yellow. Still feels he is retaining fluid. Continues prednisone 5 mg twice daily.  01/29/15- 70 year old male former smoker followed for COPD, history of small cell cancer, history of pneumothorax, hx DVT/PE/warfarin(December 10, 2013) FOLLOWS FOR:Sob improved over 2-3 mths.,occass. wheezing,nasal congestion,bilat. edema ankles and calves,no fcs,occass. midchest tightness Says he is breathing better with no acute events. Seldom uses rescue inhaler now. Feet still swell some since pneumonia in January and he shows rash on leg which looks like early stasis dermatitis. Coumadin has been managed by his cardiologist in [place]. Maintenance prednisone is now at 5 mg daily  04/30/15-70 year old male former smoker followed for COPD, history of small cell cancer, history of pneumothorax, hx DVT/PE/warfarin([birth year]) Follows For: SOB has improved. Currently using Advair. Denies any coughing or wheezing.  Disliked Anoro and went back to Advair which is expensive. Continues prednisone 10 mg daily, finding 5 mg was not enough. CXR 12/12/14 IMPRESSION: COPD changes with scattered scarring bilaterally greater on LEFT. No definite acute abnormalities. Electronically Signed  By: Lavonia Dana M.D.  On: 12/12/2014 10:49  09/02/15- 70 year old male former smoker followed for COPD, history of small cell cancer, history of pneumothorax, hx DVT/PE/warfarin(December 10, 2013) FOLLOWS FOR: Pt  has decided to continue using Breo as he feels  this works better than the [place]. Denies acute exacerbation. Tolerated colonoscopy well. Continues maintenance prednisone 10 mg daily.  03/02/2016-70 year old male former smoker followed for COPD, history small cell cancer, history pneumothorax, history DVT/PE/off warfarin  FOLLOWS FOR: Pt states his breathing is off at times-get SOB and feels that his rescue inhaler is not working as well as it should. Chest cold ran on about a month and is finally almost gone. Still coughing a little yellow. He doesn't think his pro-air rescue inhaler working very effectively. Continues prednisone 10 mg twice daily maintenance.  ROS-see HPI   Negative unless "+" Constitutional:    weight loss, night sweats, fevers, chills, fatigue, lassitude. HEENT:    headaches, difficulty swallowing, tooth/dental problems, sore throat,       sneezing, itching, ear ache, +nasal congestion, post nasal drip, snoring CV:    chest pain, orthopnea, PND, +swelling in lower extremities, anasarca,                                                     dizziness, palpitations Resp:   +shortness of breath with exertion or at rest.                + productive cough,   non-productive cough, coughing up of blood.              + change in color of mucus.  wheezing.   Skin:    rash or lesions. GI:  No-   heartburn, indigestion, abdominal pain, nausea, vomiting,  GU: . MS:   joint pain, stiffness,  Neuro-     nothing unusual Psych:  change in mood or affect.  depression or anxiety.   memory loss.  OBJ- Physical Exam General- Alert, Oriented, Affect-appropriate, Distress- none acute Skin- rash-none, lesions- none, excoriation- none Lymphadenopathy- none Head- atraumatic            Eyes- Gross vision intact, PERRLA, conjunctivae and secretions clear            Ears- Hearing, canals-normal            Nose- Clear, no-Septal dev, mucus, polyps, erosion, perforation             Throat- Mallampati II , mucosa clear , drainage- none,  tonsils- atrophic Neck- flexible , trachea midline, no stridor , thyroid nl, carotid no bruit Chest - symmetrical excursion , unlabored           Heart/CV- RRR , no murmur , no gallop  , no rub, nl s1 s2                           - JVD- none , edema-none stasis changes- none, varices+           Lung- + few crackles right base again noted, wheeze + slight, cough-none, dullness-none, rub- none           Chest wall-  Abd-  Br/ Gen/ Rectal- Not done, not indicated Extrem- cyanosis- none, clubbing, none, atrophy- none, strength- nl, + superficial varices Neuro- grossly intact to observation

## 2016-03-04 NOTE — Assessment & Plan Note (Signed)
No recurrence despite increased coughing with recent bronchitis

## 2016-03-04 NOTE — Assessment & Plan Note (Signed)
Infectious bronchitis exacerbation recently, now almost back to baseline. Plan-sample Breo 200. When that is used up return to 100. Refill present meds.. Consider wind try again to taper prednisone

## 2016-03-13 DIAGNOSIS — D039 Melanoma in situ, unspecified: Secondary | ICD-10-CM | POA: Diagnosis not present

## 2016-03-13 DIAGNOSIS — I10 Essential (primary) hypertension: Secondary | ICD-10-CM | POA: Diagnosis not present

## 2016-03-23 ENCOUNTER — Telehealth: Payer: Self-pay | Admitting: Internal Medicine

## 2016-03-23 MED ORDER — CEFDINIR 300 MG PO CAPS: 300.0000 mg | ORAL_CAPSULE | Freq: Two times a day (BID) | ORAL | Status: DC

## 2016-03-23 MED ORDER — PREDNISONE 20 MG PO TABS: ORAL_TABLET | ORAL | Status: DC

## 2016-03-23 NOTE — Telephone Encounter (Signed)
Pt calling back because he still has not heard anything back since [name] callled earlier

## 2016-03-23 NOTE — Telephone Encounter (Signed)
Offer omnicef 300 mg, # 14, 1 twice daily           Also suggest he increase his maintenance prednisone to 40 mg daily x 4 days, then drop back to 20 mg daily

## 2016-03-23 NOTE — Telephone Encounter (Signed)
lmtcb x1 for pt. 

## 2016-03-23 NOTE — Telephone Encounter (Signed)
Pt is aware of CY's recommendations. Rxs have been sent in. Nothing further was needed.

## 2016-03-23 NOTE — Telephone Encounter (Signed)
Spoke with pt. States that she is having breathing issues. Reports SOB, wheezing, coughing with production of yellow mucus. Denies fever or chest tightness. Onset of symptoms started 1 week ago. Has tried Mucinex and Tylenol with no relief. Would like to be seen or have something called in. CY - please advise. Thanks.  Allergies  Allergen Reactions  . Other Other (See Comments)    Hydromet cough syrup-causes GI upset  . Codeine Nausea And Vomiting   Current Outpatient Prescriptions on File Prior to Visit  Medication Sig Dispense Refill  . acetaminophen (TYLENOL) 325 MG tablet Take 650 mg by mouth as needed.      Marland Kitchen albuterol (PROAIR HFA) 108 (90 BASE) MCG/ACT inhaler 2 puffs every 4 hours if needed- rescue 1 Inhaler 11  . albuterol (PROVENTIL HFA;VENTOLIN HFA) 108 (90 Base) MCG/ACT inhaler Inhale 2 puffs into the lungs every 6 (six) hours as needed for wheezing or shortness of breath. 1 Inhaler 12  . aspirin 81 MG EC tablet Take 81 mg by mouth daily. Swallow whole.    Marland Kitchen atorvastatin (LIPITOR) 10 MG tablet TAKE 1 TABLET BY MOUTH EVERY DAY 90 tablet 3  . BREO ELLIPTA 100-25 MCG/INH AEPB INHALE 1 PUFF INTO THE LUNGS DAILY 60 each 12  . chlorthalidone (HYGROTON) 25 MG tablet Take 25 mg by mouth daily.    . fluticasone furoate-vilanterol (BREO ELLIPTA) 200-25 MCG/INH AEPB Inhale 1 puff into the lungs daily. 1 each 0  . guaiFENesin (MUCINEX) 600 MG 12 hr tablet Take 600 mg by mouth daily.     Marland Kitchen losartan (COZAAR) 100 MG tablet Take 100 mg by mouth daily.  3  . predniSONE (DELTASONE) 5 MG tablet 20 mg daily 60 tablet 12  . traMADol (ULTRAM) 50 MG tablet Takes 2 tablets 4 times daily as needed for pain.     No current facility-administered medications on file prior to visit.

## 2016-03-23 NOTE — Telephone Encounter (Signed)
High priority, pt calling back

## 2016-03-30 ENCOUNTER — Telehealth: Payer: Self-pay | Admitting: Internal Medicine

## 2016-03-30 MED ORDER — FLUTICASONE-SALMETEROL 100-50 MCG/DOSE IN AEPB: 1.0000 | INHALATION_SPRAY | Freq: Two times a day (BID) | RESPIRATORY_TRACT | Status: DC

## 2016-03-30 NOTE — Addendum Note (Signed)
Addended by: Baird Lyons D on: 03/30/2016 11:54 AM   Modules accepted: Miquel Dunn

## 2016-03-30 NOTE — Telephone Encounter (Signed)
Ok to offer Advair 10/50, # 1    Inhale 1 puff then rinse mouth, twice daily, refill x 12

## 2016-03-30 NOTE — Telephone Encounter (Signed)
Pt is requesting an alternative inhaler - states that Memory Dance is not working and states that Advair had a longer lasting affect on his breathing.  Requesting either he be changed back to Advair or something similar. Please advise Dr Annamaria Boots. Thanks.   Allergies  Allergen Reactions  . Other Other (See Comments)    Hydromet cough syrup-causes GI upset  . Codeine Nausea And Vomiting     Medication List       This list is accurate as of: 03/30/16 10:55 AM.  Always use your most recent med list.               acetaminophen 325 MG tablet  Commonly known as:  TYLENOL  Take 650 mg by mouth as needed.     albuterol 108 (90 Base) MCG/ACT inhaler  Commonly known as:  PROAIR HFA  2 puffs every 4 hours if needed- rescue     albuterol 108 (90 Base) MCG/ACT inhaler  Commonly known as:  PROVENTIL HFA;VENTOLIN HFA  Inhale 2 puffs into the lungs every 6 (six) hours as needed for wheezing or shortness of breath.     aspirin 81 MG EC tablet  Take 81 mg by mouth daily. Swallow whole.     atorvastatin 10 MG tablet  Commonly known as:  LIPITOR  TAKE 1 TABLET BY MOUTH EVERY DAY     BREO ELLIPTA 100-25 MCG/INH Aepb  Generic drug:  fluticasone furoate-vilanterol  INHALE 1 PUFF INTO THE LUNGS DAILY     fluticasone furoate-vilanterol 200-25 MCG/INH Aepb  Commonly known as:  BREO ELLIPTA  Inhale 1 puff into the lungs daily.     cefdinir 300 MG capsule  Commonly known as:  OMNICEF  Take 1 capsule (300 mg total) by mouth 2 (two) times daily.     chlorthalidone 25 MG tablet  Commonly known as:  HYGROTON  Take 25 mg by mouth daily.     guaiFENesin 600 MG 12 hr tablet  Commonly known as:  MUCINEX  Take 600 mg by mouth daily.     losartan 100 MG tablet  Commonly known as:  COZAAR  Take 100 mg by mouth daily.     predniSONE 20 MG tablet  Commonly known as:  DELTASONE  Take '40mg'$  daily for 4 days, then decrease to maintenance dose of '20mg'$  daily     traMADol 50 MG tablet  Commonly known as:   ULTRAM  Takes 2 tablets 4 times daily as needed for pain.

## 2016-03-30 NOTE — Telephone Encounter (Signed)
Spoke with pt, states that per his formulary advair 100/50 is covered by his insurance  Pt uses [facility] on [address]   CY ok to change?  Thanks!

## 2016-03-30 NOTE — Telephone Encounter (Signed)
He had complained abut price of Advair.  Suggest he check with his insurance formulary to see which of these comparable meds will be cheapest for him: Advair, Breo, Symbicort, Dulera, AirDuo.

## 2016-03-30 NOTE — Telephone Encounter (Signed)
Called spoke with pt. Aware of recs below. RX sent in. Nothing further needed 

## 2016-04-13 DIAGNOSIS — I10 Essential (primary) hypertension: Secondary | ICD-10-CM | POA: Diagnosis not present

## 2016-04-13 DIAGNOSIS — J209 Acute bronchitis, unspecified: Secondary | ICD-10-CM | POA: Diagnosis not present

## 2016-04-28 ENCOUNTER — Telehealth: Payer: Self-pay | Admitting: Internal Medicine

## 2016-04-28 DIAGNOSIS — J449 Chronic obstructive pulmonary disease, unspecified: Secondary | ICD-10-CM

## 2016-04-28 MED ORDER — CEFDINIR 300 MG PO CAPS: 300.0000 mg | ORAL_CAPSULE | Freq: Two times a day (BID) | ORAL | 0 refills | Status: DC

## 2016-04-28 MED ORDER — PREDNISONE 20 MG PO TABS: ORAL_TABLET | ORAL | 0 refills | Status: DC

## 2016-04-28 NOTE — Telephone Encounter (Signed)
Spoke with pt and he states that he has increasing SOB with exertion, chest tightness, some wheeze and cough with yellow mucus. Pt denies CP or f/n/v. Pt has used albuterol HFA with some improvement. Pt states that he has declined since LOV. Pt was offered appt with TP for today but is refusing to see anyone other than CY. Pt also requests CXR.   CY - Please advise. Thanks!   Allergies  Allergen Reactions  . Other Other (See Comments)    Hydromet cough syrup-causes GI upset  . Codeine Nausea And Vomiting    Current Outpatient Prescriptions on File Prior to Visit  Medication Sig Dispense Refill  . acetaminophen (TYLENOL) 325 MG tablet Take 650 mg by mouth as needed.      Marland Kitchen albuterol (PROAIR HFA) 108 (90 BASE) MCG/ACT inhaler 2 puffs every 4 hours if needed- rescue 1 Inhaler 11  . albuterol (PROVENTIL HFA;VENTOLIN HFA) 108 (90 Base) MCG/ACT inhaler Inhale 2 puffs into the lungs every 6 (six) hours as needed for wheezing or shortness of breath. 1 Inhaler 12  . aspirin 81 MG EC tablet Take 81 mg by mouth daily. Swallow whole.    Marland Kitchen atorvastatin (LIPITOR) 10 MG tablet TAKE 1 TABLET BY MOUTH EVERY DAY 90 tablet 3  . BREO ELLIPTA 100-25 MCG/INH AEPB INHALE 1 PUFF INTO THE LUNGS DAILY 60 each 12  . cefdinir (OMNICEF) 300 MG capsule Take 1 capsule (300 mg total) by mouth 2 (two) times daily. 14 capsule 0  . chlorthalidone (HYGROTON) 25 MG tablet Take 25 mg by mouth daily.    . fluticasone furoate-vilanterol (BREO ELLIPTA) 200-25 MCG/INH AEPB Inhale 1 puff into the lungs daily. 1 each 0  . Fluticasone-Salmeterol (ADVAIR DISKUS) 100-50 MCG/DOSE AEPB Inhale 1 puff into the lungs 2 (two) times daily. 60 each 12  . guaiFENesin (MUCINEX) 600 MG 12 hr tablet Take 600 mg by mouth daily.     Marland Kitchen losartan (COZAAR) 100 MG tablet Take 100 mg by mouth daily.  3  . predniSONE (DELTASONE) 20 MG tablet Take '40mg'$  daily for 4 days, then decrease to maintenance dose of '20mg'$  daily 40 tablet 0  . traMADol (ULTRAM) 50 MG  tablet Takes 2 tablets 4 times daily as needed for pain.     No current facility-administered medications on file prior to visit.

## 2016-04-28 NOTE — Telephone Encounter (Signed)
Called spoke with patient and discussed CY's recommendations as stated below. Pt voiced his understanding and denied any further questions/concerns.  He will call the office back if his symptoms do not improve or they worsen.  Pt did ask for a refill on his prednisone to supplement the increase recommended by CY.  This has been done.  Pt to come to the office tomorrow for the cxr.    Nothing further needed at this time; will sign off.

## 2016-04-28 NOTE — Telephone Encounter (Signed)
Suggest- Increase his prednisonene to 40 mg daily x 5 days, then go back to 20 mg daily                  Ok to refill his previous cefdinir 300 mg, # 14, 1 twice daily  Ok to order outpatient CXR    Dx copd mixed type

## 2016-04-29 ENCOUNTER — Ambulatory Visit (INDEPENDENT_AMBULATORY_CARE_PROVIDER_SITE_OTHER)
Admission: RE | Admit: 2016-04-29 | Discharge: 2016-04-29 | Disposition: A | Discharge status: Home / Self Care | Payer: Medicare Other | Source: Ambulatory Visit | Attending: Internal Medicine | Admitting: Internal Medicine

## 2016-04-29 DIAGNOSIS — J449 Chronic obstructive pulmonary disease, unspecified: Secondary | ICD-10-CM | POA: Diagnosis not present

## 2016-04-29 DIAGNOSIS — R05 Cough: Secondary | ICD-10-CM | POA: Diagnosis not present

## 2016-05-01 ENCOUNTER — Telehealth: Payer: Self-pay | Admitting: *Deleted

## 2016-05-01 DIAGNOSIS — J189 Pneumonia, unspecified organism: Secondary | ICD-10-CM

## 2016-05-01 NOTE — Telephone Encounter (Signed)
CXR ordered in EPIC and patient is aware.

## 2016-05-08 ENCOUNTER — Telehealth: Payer: Self-pay | Admitting: Internal Medicine

## 2016-05-08 MED ORDER — PREDNISONE 20 MG PO TABS: ORAL_TABLET | ORAL | 6 refills | Status: DC

## 2016-05-08 MED ORDER — LEVOFLOXACIN 750 MG PO TABS: 750.0000 mg | ORAL_TABLET | Freq: Every day | ORAL | 0 refills | Status: DC

## 2016-05-08 NOTE — Telephone Encounter (Signed)
Spoke with pt, aware of recs.  rx sent to preferred pharmacy.  Nothing further needed.  

## 2016-05-08 NOTE — Telephone Encounter (Signed)
Offer levaquin 750 mg, # 7, 1 daily  He is on prednisone 20 mg daily maintenance. Ok to refill # 30, 1 daily, ref x 6

## 2016-05-08 NOTE — Telephone Encounter (Signed)
Spoke with pt, requesting more abx and prednisone for pna.   Pt c/o prod cough with clear/yellow mucus, sob with and without exertion.  Denies fever, chest pain, sinus congestion.    Pt was increased to '40mg'$  prednisone X5 days and put on cefdinir '300mg'$  #14 on 04/28/16.    Pt uses [facility] on [address] in [place].    CY please advise on recs.  Thanks!   Allergies  Allergen Reactions  . Other Other (See Comments)    Hydromet cough syrup-causes GI upset  . Codeine Nausea And Vomiting   Current Outpatient Prescriptions on File Prior to Visit  Medication Sig Dispense Refill  . acetaminophen (TYLENOL) 325 MG tablet Take 650 mg by mouth as needed.      Marland Kitchen albuterol (PROAIR HFA) 108 (90 BASE) MCG/ACT inhaler 2 puffs every 4 hours if needed- rescue 1 Inhaler 11  . albuterol (PROVENTIL HFA;VENTOLIN HFA) 108 (90 Base) MCG/ACT inhaler Inhale 2 puffs into the lungs every 6 (six) hours as needed for wheezing or shortness of breath. 1 Inhaler 12  . aspirin 81 MG EC tablet Take 81 mg by mouth daily. Swallow whole.    Marland Kitchen atorvastatin (LIPITOR) 10 MG tablet TAKE 1 TABLET BY MOUTH EVERY DAY 90 tablet 3  . BREO ELLIPTA 100-25 MCG/INH AEPB INHALE 1 PUFF INTO THE LUNGS DAILY 60 each 12  . cefdinir (OMNICEF) 300 MG capsule Take 1 capsule (300 mg total) by mouth 2 (two) times daily. 14 capsule 0  . chlorthalidone (HYGROTON) 25 MG tablet Take 25 mg by mouth daily.    . fluticasone furoate-vilanterol (BREO ELLIPTA) 200-25 MCG/INH AEPB Inhale 1 puff into the lungs daily. 1 each 0  . Fluticasone-Salmeterol (ADVAIR DISKUS) 100-50 MCG/DOSE AEPB Inhale 1 puff into the lungs 2 (two) times daily. 60 each 12  . guaiFENesin (MUCINEX) 600 MG 12 hr tablet Take 600 mg by mouth daily.     Marland Kitchen losartan (COZAAR) 100 MG tablet Take 100 mg by mouth daily.  3  . predniSONE (DELTASONE) 20 MG tablet Take '40mg'$  daily for 5 days, then decrease to maintenance dose of '20mg'$  daily 40 tablet 0  . traMADol (ULTRAM) 50 MG tablet Takes 2  tablets 4 times daily as needed for pain.     No current facility-administered medications on file prior to visit.

## 2016-05-19 ENCOUNTER — Telehealth: Payer: Self-pay | Admitting: Internal Medicine

## 2016-05-19 ENCOUNTER — Encounter: Payer: Self-pay | Admitting: Emergency Medicine

## 2016-05-19 ENCOUNTER — Ambulatory Visit (INDEPENDENT_AMBULATORY_CARE_PROVIDER_SITE_OTHER)
Admission: RE | Admit: 2016-05-19 | Discharge: 2016-05-19 | Disposition: A | Discharge status: Home / Self Care | Payer: Medicare Other | Source: Ambulatory Visit | Attending: Emergency Medicine | Admitting: Emergency Medicine

## 2016-05-19 ENCOUNTER — Ambulatory Visit (INDEPENDENT_AMBULATORY_CARE_PROVIDER_SITE_OTHER): Payer: Medicare Other | Admitting: Emergency Medicine

## 2016-05-19 VITALS — BP 154/90 | HR 68 | Ht 67.0 in | Wt 158.0 lb

## 2016-05-19 DIAGNOSIS — R0602 Shortness of breath: Secondary | ICD-10-CM

## 2016-05-19 DIAGNOSIS — J479 Bronchiectasis, uncomplicated: Secondary | ICD-10-CM

## 2016-05-19 DIAGNOSIS — J439 Emphysema, unspecified: Secondary | ICD-10-CM | POA: Insufficient documentation

## 2016-05-19 DIAGNOSIS — I2699 Other pulmonary embolism without acute cor pulmonale: Secondary | ICD-10-CM

## 2016-05-19 DIAGNOSIS — C3492 Malignant neoplasm of unspecified part of left bronchus or lung: Secondary | ICD-10-CM

## 2016-05-19 DIAGNOSIS — J449 Chronic obstructive pulmonary disease, unspecified: Secondary | ICD-10-CM

## 2016-05-19 NOTE — Assessment & Plan Note (Addendum)
Non-small cell lung cancer, Post lobectomy remotely

## 2016-05-19 NOTE — Patient Instructions (Signed)
Please continue your current inhaled medications. Continue prednisone 5 mg twice a day We will perform a CT scan of her chest to evaluate for resolution of pneumonia and also your left lower lobe lung cavity Follow with Dr. Annamaria Boots next available. Be sure to call our office if her breathing changes or worsens in anyway.

## 2016-05-19 NOTE — Assessment & Plan Note (Addendum)
Continue Advair plus Pro Air for now. He may need to have addition of LAMA Continue prednisone '5mg'$  twice a day

## 2016-05-19 NOTE — Telephone Encounter (Signed)
CY Please advise how soon you want patient to follow up with you. Thanks.

## 2016-05-19 NOTE — Telephone Encounter (Signed)
CY/KW please advise if pt can be worked in to follow up on CT chest- Pt was seen by RB today, and pt had CT chest today.  Thanks!

## 2016-05-19 NOTE — Assessment & Plan Note (Signed)
No longer on anticoagulation

## 2016-05-19 NOTE — Progress Notes (Signed)
Subjective:    Patient ID: [name], male    DOB: 12/28/1945, 70 y.o.   MRN: [id number]  HPI 70 year old man with a history of Tobacco use (90 pack years) COPD/asthma, coronary artery disease, Non- Small cell lung cancer for which he was treated with LUL lobectomy , Remote DVT/PE off anti-coag x 1 year, Maintained on chronic prednisone currently on '5mg'$  bid. He has been having off and on worsening dyspnea, cough for the last 2 months. He's been treated with empiric antibiotics and prednisone 3 times (most recently levaquin). A chest x-ray done on 04/29/16 showed A patchy infiltrate with a new left lower lung bullous lesion with an air-fluid level. He finished most recent abx 4 days ago. He feels that his breathing is probably a bit better, but cough and mucous are beginning to return. He is on Advair 100, changed back to this from breo about 2 months ago. Using proair 3x a day. Has some low grade fevers, occasionally chills, denies sweats.     Review of Systems As per HPI  Past Medical History:  Diagnosis Date  . Acute MI ([place])   . Arthritis of lumbar spine   . Asthma   . Clotting disorder ([place])   . Collapsed lung    x3  . COPD (chronic obstructive pulmonary disease) ([place])   . Dyspnea   . History of benign thyroid tumor   . Lung cancer ([place])   . Pneumonia   . Pneumothorax   . Small cell carcinoma of lung ([place])   . Spontaneous pneumothorax      Family History  Problem Relation Age of Onset  . Heart disease Mother   . Colon cancer Father   . Heart failure Brother      Social History   Social History  . Marital status: Married    Spouse name: N/A  . Number of children: 1  . Years of education: N/A   Occupational History  . 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 Smoking status: Former Smoker    Packs/day: 3.00    Years: 30.00    Types: Cigars    Quit date: 03/02/1992  . Smokeless tobacco: Not on file     Comment: quit 22 yrs ago  .  Alcohol use No  . Drug use: No  . Sexual activity: Not on file   Other Topics Concern  . Not on file   Social History Narrative  . No narrative on file     Allergies  Allergen Reactions  . Other Other (See Comments)    Hydromet cough syrup-causes GI upset  . Codeine Nausea And Vomiting     Outpatient Medications Prior to Visit  Medication Sig Dispense Refill  . acetaminophen (TYLENOL) 325 MG tablet Take 650 mg by mouth as needed.      Marland Kitchen albuterol (PROVENTIL HFA;VENTOLIN HFA) 108 (90 Base) MCG/ACT inhaler Inhale 2 puffs into the lungs every 6 (six) hours as needed for wheezing or shortness of breath. 1 Inhaler 12  . aspirin 81 MG EC tablet Take 81 mg by mouth daily. Swallow whole.    Marland Kitchen atorvastatin (LIPITOR) 10 MG tablet TAKE 1 TABLET BY MOUTH EVERY DAY 90 tablet 3  . chlorthalidone (HYGROTON) 25 MG tablet Take 25 mg by mouth daily.    . Fluticasone-Salmeterol (ADVAIR DISKUS) 100-50 MCG/DOSE AEPB Inhale 1 puff into the lungs 2 (two) times daily. 60 each 12  .  guaiFENesin (MUCINEX) 600 MG 12 hr tablet Take 600 mg by mouth daily.     Marland Kitchen losartan (COZAAR) 100 MG tablet Take 100 mg by mouth daily.  3  . predniSONE (DELTASONE) 20 MG tablet 1 tablet by mouth daily 30 tablet 6  . traMADol (ULTRAM) 50 MG tablet Takes 2 tablets 4 times daily as needed for pain.    Marland Kitchen albuterol (PROAIR HFA) 108 (90 BASE) MCG/ACT inhaler 2 puffs every 4 hours if needed- rescue 1 Inhaler 11  . BREO ELLIPTA 100-25 MCG/INH AEPB INHALE 1 PUFF INTO THE LUNGS DAILY 60 each 12  . cefdinir (OMNICEF) 300 MG capsule Take 1 capsule (300 mg total) by mouth 2 (two) times daily. 14 capsule 0  . fluticasone furoate-vilanterol (BREO ELLIPTA) 200-25 MCG/INH AEPB Inhale 1 puff into the lungs daily. 1 each 0  . levofloxacin (LEVAQUIN) 750 MG tablet Take 1 tablet (750 mg total) by mouth daily. 7 tablet 0   No facility-administered medications prior to visit.          Objective:   Physical Exam  Vitals:   05/19/16 1208  05/19/16 1209  BP:  (!) 154/90  Pulse:  68  SpO2:  99%  Weight: 158 lb (71.7 kg)   Height: '5\' 7"'$  (1.702 m)    Gen: Pleasant, Obese gentleman, in no distress,  normal affect  ENT: No lesions,  mouth clear,  oropharynx clear, no postnasal drip  Neck: No JVD, no TMG, no carotid bruits  Lungs: No use of accessory muscles, Bilateral basilar inspiratory crackles, no wheezing  Cardiovascular: RRR, heart sounds normal, no murmur or gallops, no peripheral edema  Musculoskeletal: No deformities, no cyanosis or clubbing  Neuro: alert, non focal  Skin: Warm, no lesions or rashes    04/29/16 --  COMPARISON:  PA and lateral chest x-ray of December 12, 2014  FINDINGS: The lungs are reasonably well inflated. There is chronic volume loss on the left. There is an air-fluid level within a large bullous lesion on the in the left lower lobe. The interstitial markings are increased with areas of patchy alveolar opacity noted peripherally in the left upper lobe. There is stable scarring at the right lung base. The heart is not enlarged. The pulmonary vascularity is not clearly engorged. There are surgical clips in the left hilum. There is calcification in the wall of the aortic arch. The bony thorax is unremarkable.  IMPRESSION: COPD. Postsurgical changes on the left. New air-fluid level in what appears to be a bullous lesion in the right lower lung more conspicuous than in the past. Increased interstitial density bilaterally with areas of patchy confluence peripherally in the left upper lobe. Pneumonia superimposed upon underlying disease lung on the left is suspected. Recurrent malignancy is not excluded.  Aortic atherosclerosis.  Chest CT scanning is recommended given the history of lung malignancy      Assessment & Plan:  Pulmonary embolus No longer on anticoagulation  Adenocarcinoma of left lung (HCC) Non-small cell lung cancer, Post lobectomy remotely  COPD mixed type  ([place]) Continue Advair plus Pro Air for now. He may need to have addition of LAMA Continue prednisone '5mg'$  twice a day  Lung disease, bullous ([place]) Left lower lobe bullous disease now with an air-fluid level. Unclear as to whether he may have a bullitis or chronic infection present that is making it difficult for him to recover from suspected left sided pneumonia with standard therapy. At this point I would defer reordering antibiotics since he's been  treated multiple times in the last 2 months. I discussed the case with Dr. Annamaria Boots. We will obtain a CT scan of the chest to further characterize the left lower lobe. He may need chronic antibiotics. He will be a poor candidate for surgical resection.   Baltazar Apo, MD, PhD 05/19/2016, 12:45 PM [facility] [phone] or if no answer [phone]

## 2016-05-19 NOTE — Telephone Encounter (Signed)
Can we see him in a month?  Please order outpatient sputum cultures- routine, fungal and AFB  For dx bronchiectasis  Can we try to qualify him for a pneumatic vest for dx bronchiectasis/

## 2016-05-19 NOTE — Assessment & Plan Note (Signed)
Left lower lobe bullous disease now with an air-fluid level. Unclear as to whether he may have a bullitis or chronic infection present that is making it difficult for him to recover from suspected left sided pneumonia with standard therapy. At this point I would defer reordering antibiotics since he's been treated multiple times in the last 2 months. I discussed the case with Dr. Annamaria Boots. We will obtain a CT scan of the chest to further characterize the left lower lobe. He may need chronic antibiotics. He will be a poor candidate for surgical resection.

## 2016-05-19 NOTE — Telephone Encounter (Signed)
Spoke with pt and he c/o shob, cough with white mucus and some wheeze. Pt reports that he finished abx this past weekend and has gotten worse.   Appt scheduled with RB at 12pm. Pt aware. Pt advised if symptoms worsen to seek emergency care.

## 2016-05-20 ENCOUNTER — Telehealth: Payer: Self-pay | Admitting: Internal Medicine

## 2016-05-20 ENCOUNTER — Other Ambulatory Visit: Payer: Medicare Other

## 2016-05-20 ENCOUNTER — Encounter: Payer: Self-pay | Admitting: Internal Medicine

## 2016-05-20 DIAGNOSIS — J470 Bronchiectasis with acute lower respiratory infection: Secondary | ICD-10-CM | POA: Insufficient documentation

## 2016-05-20 DIAGNOSIS — J479 Bronchiectasis, uncomplicated: Secondary | ICD-10-CM | POA: Diagnosis not present

## 2016-05-20 NOTE — Telephone Encounter (Signed)
Pt is calling for results of CT scan that was done yesterday.  CY please advise. Thanks  Last ov--03/02/16 Next ov--09/07/16

## 2016-05-20 NOTE — Telephone Encounter (Signed)
Documentation note- Variable productive cough over at least the past year and worse over the last 2-3 months after winter exacerbation. CT scan of the chest has shown bronchiectasis and now shows air-fluid levels consistent with infected blebs and saccular bronchiectatic areas. His efforts at airway clearance with flutter device have clearly been insufficient to prevent accumulation of secretions in these bronchiectatic and cystic areas.. No skilled caregivers available for chest physical therapy. He has had multiple courses of antibiotics. Office notes detail multiple office visits and numerous treatment interventions by telephone over the past year. In my opinion, our best hope for improving airway clearance and reversing the progression of his bronchiectasis and related complications,  is to try pneumatic vest therapy now.

## 2016-05-20 NOTE — Telephone Encounter (Signed)
Spoke with patient; aware that he needs to come by for sputum culture testing, OV made for 07/02/16 at 4:15pm, and order for Pneumatic Vest has been placed.    Nothing more needed at this time.

## 2016-05-20 NOTE — Telephone Encounter (Signed)
The CT scan does look like infected fluid in blebs, especially one large one in the lower left lung.  He has had a lot of antibiotics this summer. I think the key is to help his lungs empty this fluid better. We may need more antibiotics later. For now would like to see how he does with the pneumatic vest which we ordered yesterday, to help him clear his airways better.

## 2016-05-20 NOTE — Telephone Encounter (Signed)
Spoke with pt and notified of results per Dr. Young Pt verbalized understanding and denied any questions. 

## 2016-05-23 LAB — RESPIRATORY CULTURE OR RESPIRATORY AND SPUTUM CULTURE: Organism ID, Bacteria: NORMAL

## 2016-05-25 ENCOUNTER — Other Ambulatory Visit: Payer: Self-pay | Admitting: Internal Medicine

## 2016-05-25 ENCOUNTER — Telehealth: Payer: Self-pay | Admitting: Internal Medicine

## 2016-06-04 NOTE — Telephone Encounter (Signed)
LMTCB

## 2016-06-04 NOTE — Telephone Encounter (Signed)
There is something growing from the sputum cultures, but it hasn't been identified yet. It will probably be a very slow growing bug, so we may not get a final report for a few more weeks. If he feels stable, we will wait for the final report before making a treatment decision.

## 2016-06-05 NOTE — Telephone Encounter (Signed)
Patient returning call -He can be reached at [phone]

## 2016-06-05 NOTE — Telephone Encounter (Signed)
Called spoke with patient and discussed CY's recommendations as stated below Pt voiced his understanding Pt stated he is doing well now with the Vest, still has some dyspnea.  He will call back if his symptoms worsen Nothing further needed at this time; will sign off

## 2016-06-08 ENCOUNTER — Telehealth: Payer: Self-pay

## 2016-06-08 DIAGNOSIS — A310 Pulmonary mycobacterial infection: Secondary | ICD-10-CM

## 2016-06-08 DIAGNOSIS — J439 Emphysema, unspecified: Secondary | ICD-10-CM

## 2016-06-08 NOTE — Telephone Encounter (Signed)
Patina @ [facility] called with CALL REPORT on pt's sputum culture.  Culture POSITIVE for AFB and MAC.  CY - [name]

## 2016-06-09 ENCOUNTER — Telehealth: Payer: Self-pay | Admitting: Internal Medicine

## 2016-06-09 NOTE — Telephone Encounter (Signed)
Orders placed. Pt aware of orders and will come for CXR tomorrow. Nothing further needed at this time.

## 2016-06-09 NOTE — Telephone Encounter (Signed)
I explained that sputum culture is now rowing MAC. Smear had shown 4+ AFB, so there is high number of organisms, with significant underlying scarring/ bronchiectasis. No other explanation yet for infected bleb with AFL on recent CT. He has been using pneumatic VEST for past week- definitely helps bring up secretions. I would like to get outpatient CXR tomorrow for status check, then I think we should ask for ID help with this one.  Staff- please order CXR   Dx infected bleb, MAIC            Please order referral to Infectious Disease- dx MAIC.

## 2016-06-09 NOTE — Telephone Encounter (Signed)
Spoke with the pt  He states CDY called him personally and he is returning his call  I advised that he is seeing pt's at this time, but will let him know he is returning call

## 2016-06-09 NOTE — Telephone Encounter (Signed)
LM to call us about sputum cx.

## 2016-06-10 ENCOUNTER — Ambulatory Visit (INDEPENDENT_AMBULATORY_CARE_PROVIDER_SITE_OTHER)
Admission: RE | Admit: 2016-06-10 | Discharge: 2016-06-10 | Disposition: A | Discharge status: Home / Self Care | Payer: Medicare Other | Source: Ambulatory Visit | Attending: Internal Medicine | Admitting: Internal Medicine

## 2016-06-10 DIAGNOSIS — R05 Cough: Secondary | ICD-10-CM | POA: Diagnosis not present

## 2016-06-10 DIAGNOSIS — J439 Emphysema, unspecified: Secondary | ICD-10-CM

## 2016-06-10 DIAGNOSIS — A310 Pulmonary mycobacterial infection: Secondary | ICD-10-CM

## 2016-06-10 DIAGNOSIS — R0602 Shortness of breath: Secondary | ICD-10-CM | POA: Diagnosis not present

## 2016-06-11 ENCOUNTER — Encounter (INDEPENDENT_AMBULATORY_CARE_PROVIDER_SITE_OTHER): Payer: Self-pay

## 2016-06-12 ENCOUNTER — Encounter: Payer: Self-pay | Admitting: Cardiovascular Disease

## 2016-06-12 ENCOUNTER — Ambulatory Visit (INDEPENDENT_AMBULATORY_CARE_PROVIDER_SITE_OTHER): Payer: Medicare Other | Admitting: Cardiovascular Disease

## 2016-06-12 VITALS — BP 134/72 | HR 96 | Ht 67.0 in | Wt 160.4 lb

## 2016-06-12 DIAGNOSIS — E785 Hyperlipidemia, unspecified: Secondary | ICD-10-CM

## 2016-06-12 DIAGNOSIS — J449 Chronic obstructive pulmonary disease, unspecified: Secondary | ICD-10-CM | POA: Diagnosis not present

## 2016-06-12 DIAGNOSIS — I10 Essential (primary) hypertension: Secondary | ICD-10-CM | POA: Diagnosis not present

## 2016-06-12 DIAGNOSIS — I25111 Atherosclerotic heart disease of native coronary artery with angina pectoris with documented spasm: Secondary | ICD-10-CM

## 2016-06-12 DIAGNOSIS — J439 Emphysema, unspecified: Secondary | ICD-10-CM

## 2016-06-12 DIAGNOSIS — I209 Angina pectoris, unspecified: Secondary | ICD-10-CM | POA: Diagnosis not present

## 2016-06-12 NOTE — Progress Notes (Signed)
Cardiology Office Note  Date:  06/12/2016   ID:  [name], DOB 03-24-1946, MRN [id number]  PCP:  Asencion Noble, MD   Chief Complaint  Patient presents with  . Follow-up    HAD PNEUMONIA IN [place]. HAVING SOB. HAVING SOME SWELLING IN FEET AND ANKLES.    HPI:  [name] is a pleasant 70 year old gentleman with history of lung cancer, COPD who had a non-ST elevation MI after choking on a vitamin pill, admission to [facility] 05/09/2012 with peak troponin 1.5. He had a cardiac catheterization that showed nonocclusive coronary artery disease, several regions of 20% stenosis otherwise normal ejection fraction, normal echocardiogram. He presents for routine followup of his coronary artery disease, shortness of breath  Lab work in the hospital showed BNP 142, peak troponin 1.5,   In follow-up today, he is recovering from pneumonia Reports having long course of antibiotics, prednisone  Recent hospital records reviewed Recent CT 05/19/2016 documenting a 7 cm cavity, bulla, left base Also with Atherosclerosis Images reviewed with him  Other lab work reviewed including Lung Cx: mycobacteria Lab work from October 2016 with normal creatinine, sodium 132, chloride 91, will LFTs, hematocrit 33.9  He reports that his Weight is down 12 pounds in 3 months. Our numbers confirm this Currently On prednisone 20 mg daily  EKG on today's visit shows normal sinus rhythm with rate 96 bpm, no significant ST or T-wave changes  Other past medical history Previous  problem with his eye, "Clot and bleeding "  anticoagulation was held at that time by outside physician. Details are unavailable He has frequent follow-up with pulmonary for his COPD   hospital admission December 12 2011 with discharge 12/16/2013. Final diagnosis was acute respiratory failure, pneumonia with clinical sepsis, DVT with PE.  He was started on xarelto initially 15 mg twice a day then changed to 20 mg daily    on chronic steroids for lung  disease. Prior lung cancer 15 years ago with treatment at that time  Echocardiogram 12/13/2011 showed normal LV function, moderately elevated right ventricular systolic pressure Ultrasound of the leg showed DVT in the right posterior tibial vein branch otherwise no other DVT CT scan of the chest showed PE of the right middle lobe and posterior basal right lower lobe pulmonary arteries  Prior lab work, Total cholesterol 158, LDL 65, HDL 54  PMH:   has a past medical history of Acute MI ([place]); Arthritis of lumbar spine; Asthma; Clotting disorder ([place]); Collapsed lung; COPD (chronic obstructive pulmonary disease) ([place]); Dyspnea; History of benign thyroid tumor; Lung cancer ([place]); Pneumonia; Pneumothorax; Small cell carcinoma of lung ([place]); and Spontaneous pneumothorax.  PSH:    Past Surgical History:  Procedure Laterality Date  . APPENDECTOMY    . benign thryoid nodule resected    . CARDIAC CATHETERIZATION  2013   [facility]  . CHEST TUBE INSERTION    . COLONOSCOPY N/A 08/30/2015   Procedure: COLONOSCOPY;  Surgeon: Rogene Houston, MD;  Location: [facility];  Service: Endoscopy;  Laterality: N/A;  1:45  . left upper lobectomy for small cell lung cancer    . VATS R thoracotomy      Current Outpatient Prescriptions  Medication Sig Dispense Refill  . acetaminophen (TYLENOL) 325 MG tablet Take 650 mg by mouth as needed.      Marland Kitchen albuterol (PROVENTIL HFA;VENTOLIN HFA) 108 (90 Base) MCG/ACT inhaler Inhale 2 puffs into the lungs every 6 (six) hours as needed for wheezing or shortness of breath. 1 Inhaler 12  .  aspirin 81 MG EC tablet Take 81 mg by mouth daily. Swallow whole.    Marland Kitchen atorvastatin (LIPITOR) 10 MG tablet TAKE 1 TABLET BY MOUTH EVERY DAY 90 tablet 3  . chlorthalidone (HYGROTON) 25 MG tablet Take 25 mg by mouth daily.    . Fluticasone-Salmeterol (ADVAIR DISKUS) 100-50 MCG/DOSE AEPB Inhale 1 puff into the lungs 2 (two) times daily. 60 each 12  . guaiFENesin (MUCINEX) 600 MG 12 hr tablet  Take 600 mg by mouth daily.     Marland Kitchen losartan (COZAAR) 100 MG tablet Take 100 mg by mouth daily.  3  . predniSONE (DELTASONE) 20 MG tablet 1 tablet by mouth daily 30 tablet 6  . traMADol (ULTRAM) 50 MG tablet Takes 2 tablets 4 times daily as needed for pain.     No current facility-administered medications for this visit.      Allergies:   Other and Codeine   Social History:  The patient  reports that he quit smoking about 24 years ago. His smoking use included Cigars. He has a 90.00 pack-year smoking history. He does not have any smokeless tobacco history on file. He reports that he does not drink alcohol or use drugs.   Family History:   family history includes Colon cancer in his father; Heart disease in his mother; Heart failure in his brother.    Review of Systems: Review of Systems  Constitutional: Negative.   Respiratory: Positive for cough and shortness of breath.   Cardiovascular: Negative.   Gastrointestinal: Negative.   Musculoskeletal: Negative.   Neurological: Negative.   Psychiatric/Behavioral: Negative.   All other systems reviewed and are negative.   PHYSICAL EXAM: VS:  BP 134/72 (BP Location: Left Arm, Patient Position: Sitting, Cuff Size: Normal)   Pulse 96   Ht '5\' 7"'$  (1.702 m)   Wt 160 lb 6.4 oz (72.8 kg)   BMI 25.12 kg/m  , BMI Body mass index is 25.12 kg/m. GEN: Well nourished, well developed, in no acute distress  HEENT: normal  Neck: no JVD, carotid bruits, or masses Cardiac: RRR; no murmurs, rubs, or gallops,no edema  Respiratory:  Decreased breath sounds throughout with coarse breath sounds at the bases bilaterally, normal work of breathing GI: soft, nontender, nondistended, + BS MS: no deformity or atrophy  Skin: warm and dry, no rash Neuro:  Strength and sensation are intact Psych: euthymic mood, full affect    Recent Labs: No results found for requested labs within last 8760 hours.    Lipid Panel No results found for: CHOL, HDL, LDLCALC,  TRIG    Wt Readings from Last 3 Encounters:  06/12/16 160 lb 6.4 oz (72.8 kg)  05/19/16 158 lb (71.7 kg)  03/02/16 172 lb (78 kg)       ASSESSMENT AND PLAN:  Coronary artery disease involving native coronary artery of native heart with angina pectoris with documented spasm ([place]) - Plan: EKG 12-Lead Currently with no symptoms of angina. No further workup at this time. Continue current medication regimen.  Essential hypertension - Plan: EKG 12-Lead Blood pressure is well controlled on today's visit. No changes made to the medications.  Hyperlipidemia No recent lipid panel available. Numbers likely low given recent weight loss Would recommend he continue Lipitor 10 mg daily  COPD mixed type ([place]) Managed by pulmonary, long history of smoking, recent pneumonia  Lung disease, bullous ([place]) Seen on CT scan, 7 cm on the left On chronic steroids   Total encounter time more than 25 minutes  Greater  than 50% was spent in counseling and coordination of care with the patient   Disposition:   F/U  12 months   Orders Placed This Encounter  Procedures  . EKG 12-Lead     Signed, Esmond Plants, M.D., Ph.D. 06/12/2016  [place], [place]

## 2016-06-12 NOTE — Patient Instructions (Addendum)
Medication Instructions:   No medication changes made  Labwork:  No new labs needed  Testing/Procedures:  No further testing at this time  Try to wear your compression hose    Follow-Up: It was a pleasure seeing you in the office today. Please call us if you have new issues that need to be addressed before your next appt.  [phone]  Your physician wants you to follow-up in: 12 months.  You will receive a reminder letter in the mail two months in advance. If you don't receive a letter, please call our office to schedule the follow-up appointment.  If you need a refill on your cardiac medications before your next appointment, please call your pharmacy.

## 2016-06-15 ENCOUNTER — Telehealth: Payer: Self-pay | Admitting: Internal Medicine

## 2016-06-15 NOTE — Telephone Encounter (Signed)
Notes Recorded by Lorane Gell, CMA on 06/12/2016 at 3:38 PM EDT [facility] ------ Notes Recorded by Deneise Lever, MD on 06/12/2016 at 8:47 AM EDT CXR- nodules and scarring with old surgical changes, all stable. There has been a small decrease in the fluid levels in blebs. Recommend continued use of the pneumatic VEST to help clear the airways ---------------------------------------------------------------------------------------------------------------- Spoke with pt. He is aware of results. Nothing further was needed at this time.

## 2016-06-16 ENCOUNTER — Telehealth: Payer: Self-pay | Admitting: Internal Medicine

## 2016-06-16 NOTE — Telephone Encounter (Signed)
Notes Recorded by Len Blalock, CMA on 06/15/2016 at 2:57 PM EDT lmtcb X2 for pt to relay results/recs. ------ Notes Recorded by Lorane Gell, CMA on 06/12/2016 at 3:38 PM EDT [facility] ------ Notes Recorded by Deneise Lever, MD on 06/12/2016 at 8:47 AM EDT CXR- nodules and scarring with old surgical changes, all stable. There has been a small decrease in the fluid levels in blebs. Recommend continued use of the pneumatic VEST to help clear the airways. ------------------------------------------------------------------------------------------------------------------ Spoke with pt. Pt was already advised of these results. Nothing further was needed.

## 2016-06-22 LAB — FUNGUS CULTURE W SMEAR

## 2016-06-22 LAB — AFB CULTURE WITH SMEAR (NOT AT ARMC)

## 2016-06-24 ENCOUNTER — Ambulatory Visit (INDEPENDENT_AMBULATORY_CARE_PROVIDER_SITE_OTHER): Payer: Medicare Other | Admitting: Infectious Disease

## 2016-06-24 ENCOUNTER — Encounter: Payer: Self-pay | Admitting: Infectious Disease

## 2016-06-24 VITALS — BP 137/83 | HR 105 | Temp 98.1°F | Resp 22 | Ht 67.0 in | Wt 163.0 lb

## 2016-06-24 DIAGNOSIS — Z7251 High risk heterosexual behavior: Secondary | ICD-10-CM | POA: Diagnosis not present

## 2016-06-24 DIAGNOSIS — Z113 Encounter for screening for infections with a predominantly sexual mode of transmission: Secondary | ICD-10-CM

## 2016-06-24 DIAGNOSIS — J439 Emphysema, unspecified: Secondary | ICD-10-CM

## 2016-06-24 DIAGNOSIS — A310 Pulmonary mycobacterial infection: Secondary | ICD-10-CM

## 2016-06-24 DIAGNOSIS — Z23 Encounter for immunization: Secondary | ICD-10-CM

## 2016-06-24 DIAGNOSIS — J449 Chronic obstructive pulmonary disease, unspecified: Secondary | ICD-10-CM

## 2016-06-24 DIAGNOSIS — R634 Abnormal weight loss: Secondary | ICD-10-CM | POA: Diagnosis not present

## 2016-06-24 DIAGNOSIS — R509 Fever, unspecified: Secondary | ICD-10-CM

## 2016-06-24 DIAGNOSIS — J690 Pneumonitis due to inhalation of food and vomit: Secondary | ICD-10-CM

## 2016-06-24 DIAGNOSIS — C3492 Malignant neoplasm of unspecified part of left bronchus or lung: Secondary | ICD-10-CM

## 2016-06-24 HISTORY — DX: Pulmonary mycobacterial infection: A31.0

## 2016-06-24 LAB — CBC WITH DIFFERENTIAL/PLATELET
BASOS PCT: 0 %
Basophils Absolute: 0 cells/uL (ref 0–200)
EOS ABS: 576 {cells}/uL — AB (ref 15–500)
Eosinophils Relative: 3 %
HEMATOCRIT: 28.2 % — AB (ref 38.5–50.0)
HEMOGLOBIN: 9.2 g/dL — AB (ref 13.2–17.1)
LYMPHS ABS: 960 {cells}/uL (ref 850–3900)
LYMPHS PCT: 5 %
MCH: 28.7 pg (ref 27.0–33.0)
MCHC: 32.6 g/dL (ref 32.0–36.0)
MCV: 87.9 fL (ref 80.0–100.0)
MONO ABS: 960 {cells}/uL — AB (ref 200–950)
MPV: 8.4 fL (ref 7.5–12.5)
Monocytes Relative: 5 %
NEUTROS PCT: 87 %
Neutro Abs: 16704 cells/uL — ABNORMAL HIGH (ref 1500–7800)
Platelets: 355 10*3/uL (ref 140–400)
RBC: 3.21 MIL/uL — AB (ref 4.20–5.80)
RDW: 16.3 % — AB (ref 11.0–15.0)
WBC: 19.2 10*3/uL — ABNORMAL HIGH (ref 3.8–10.8)

## 2016-06-24 LAB — COMPLETE METABOLIC PANEL WITH GFR
ALT: 16 U/L (ref 9–46)
AST: 14 U/L (ref 10–35)
Albumin: 3.6 g/dL (ref 3.6–5.1)
Alkaline Phosphatase: 35 U/L — ABNORMAL LOW (ref 40–115)
BUN: 21 mg/dL (ref 7–25)
CHLORIDE: 97 mmol/L — AB (ref 98–110)
CO2: 24 mmol/L (ref 20–31)
CREATININE: 1.03 mg/dL (ref 0.70–1.25)
Calcium: 9.1 mg/dL (ref 8.6–10.3)
GFR, Est African American: 85 mL/min (ref >=60)
GFR, Est Non African American: 74 mL/min (ref >=60)
Glucose, Bld: 116 mg/dL — ABNORMAL HIGH (ref 65–99)
POTASSIUM: 4.1 mmol/L (ref 3.5–5.3)
SODIUM: 133 mmol/L — AB (ref 135–146)
Total Bilirubin: 0.6 mg/dL (ref 0.2–1.2)
Total Protein: 5.9 g/dL — ABNORMAL LOW (ref 6.1–8.1)

## 2016-06-24 MED ORDER — ETHAMBUTOL HCL 400 MG PO TABS
15.0000 mg/kg | ORAL_TABLET | Freq: Every day | ORAL | 11 refills | Status: DC
Start: 2016-06-24 — End: 2016-07-29

## 2016-06-24 MED ORDER — RIFAMPIN 300 MG PO CAPS: 600.0000 mg | ORAL_CAPSULE | Freq: Every day | ORAL | 11 refills | Status: DC

## 2016-06-24 MED ORDER — AZITHROMYCIN 500 MG PO TABS: 500.0000 mg | ORAL_TABLET | Freq: Every day | ORAL | 11 refills | Status: DC

## 2016-06-24 NOTE — Patient Instructions (Signed)
I am supplying you with a specimen collection kit to collect additional sputum to bring back to [place]  We will start 3 drug regimen for MAI  If you have nausea witht his let us knwo and we can call in some meds for that  I would like you to meet with my pharmacist today and about 2-3 weeks after starting your new meds

## 2016-06-24 NOTE — Progress Notes (Signed)
Consult: possible cavitary M avium disease  Requesting Physician: Dr. Annamaria Boots    Subjective:    Patient ID: [name], male    DOB: June 26, 1946, 70 y.o.   MRN: [id number]  HPI  70 year old Caucasian man with history of Tobacco use (90 pack years) COPD/asthma, coronary artery disease, Non- Small cell lung cancer for which he was treated with LUL lobectomy , Remote DVT/PE off anti-coag x 1 year, Maintained on chronic prednisone who has been suffering from recurrent cough, worsening dyspnea for several months. He has been treated 4 times with courses of antibiotics and prednisone with some improvement. He had CXR and then CT scan that showed   "7 cm left lower lobe cavity, air-fluid level suggesting an infected bulla. Numerous scattered mildly thick walled cavities scattered in the lower lobes, left greater than right, differential diagnostic considerations might include infected bulla, septic pulmonary emboli, chronic aspiration with inflamed saccular bronchiectasis, or fungal infection"  Patient had sputum with 4+ AFB and which has grown M avium. Fungal cultures were negative. He continues to complain of cough and DOE. He has lost some weight but no fevers or night sweats.  Past Medical History:  Diagnosis Date  . Acute MI ([place])   . Arthritis of lumbar spine   . Asthma   . Clotting disorder ([place])   . Collapsed lung    x3  . COPD (chronic obstructive pulmonary disease) ([place])   . Dyspnea   . History of benign thyroid tumor   . Lung cancer ([place])   . Mycobacterium avium complex ([place]) 06/24/2016  . Pneumonia   . Pneumothorax   . Small cell carcinoma of lung ([place])   . Spontaneous pneumothorax     Past Surgical History:  Procedure Laterality Date  . APPENDECTOMY    . benign thryoid nodule resected    . CARDIAC CATHETERIZATION  2013   [facility]  . CHEST TUBE INSERTION    . COLONOSCOPY N/A 08/30/2015   Procedure: COLONOSCOPY;  Surgeon: Rogene Houston, MD;  Location[facility];  Service: Endoscopy;  Laterality: N/A;  1:45  . left upper lobectomy for small cell lung cancer    . VATS R thoracotomy      Family History  Problem Relation Age of Onset  . Heart disease Mother   . Colon cancer Father   . Heart failure Brother       Social History   Social History  . Marital status: Married    Spouse name: N/A  . Number of children: 1  . Years of education: N/A   Occupational History  . 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 Smoking status: Former Smoker    Packs/day: 3.00    Years: 30.00    Types: Cigars    Quit date: 03/02/1992  . Smokeless tobacco: Not on file     Comment: quit 22 yrs ago  . Alcohol use No  . Drug use: No  . Sexual activity: Not on file   Other Topics Concern  . Not on file   Social History Narrative  . No narrative on file    Allergies  Allergen Reactions  . Other Other (See Comments)    Hydromet cough syrup-causes GI upset  . Codeine Nausea And Vomiting     Current Outpatient Prescriptions:  .  acetaminophen (TYLENOL) 325 MG tablet, Take 650 mg by mouth as needed.  , Disp: ,  Rfl:  .  albuterol (PROVENTIL HFA;VENTOLIN HFA) 108 (90 Base) MCG/ACT inhaler, Inhale 2 puffs into the lungs every 6 (six) hours as needed for wheezing or shortness of breath., Disp: 1 Inhaler, Rfl: 12 .  aspirin 81 MG EC tablet, Take 81 mg by mouth daily. Swallow whole., Disp: , Rfl:  .  atorvastatin (LIPITOR) 10 MG tablet, TAKE 1 TABLET BY MOUTH EVERY DAY, Disp: 90 tablet, Rfl: 3 .  azithromycin (ZITHROMAX) 500 MG tablet, Take 1 tablet (500 mg total) by mouth daily., Disp: 30 tablet, Rfl: 11 .  chlorthalidone (HYGROTON) 25 MG tablet, Take 25 mg by mouth daily., Disp: , Rfl:  .  ethambutol (MYAMBUTOL) 400 MG tablet, Take 3 tablets (1,200 mg total) by mouth daily., Disp: 90 tablet, Rfl: 11 .  Fluticasone-Salmeterol (ADVAIR DISKUS) 100-50 MCG/DOSE AEPB, Inhale 1 puff into the lungs 2 (two) times daily., Disp: 60  each, Rfl: 12 .  guaiFENesin (MUCINEX) 600 MG 12 hr tablet, Take 600 mg by mouth daily. , Disp: , Rfl:  .  losartan (COZAAR) 100 MG tablet, Take 100 mg by mouth daily., Disp: , Rfl: 3 .  rifampin (RIFADIN) 300 MG capsule, Take 2 capsules (600 mg total) by mouth daily., Disp: 60 capsule, Rfl: 11 .  traMADol (ULTRAM) 50 MG tablet, Takes 2 tablets 4 times daily as needed for pain., Disp: , Rfl:     Review of Systems  Constitutional: Positive for unexpected weight change. Negative for chills and fever.  HENT: Negative for congestion and sore throat.   Eyes: Negative for photophobia.  Respiratory: Positive for cough and shortness of breath. Negative for wheezing.   Cardiovascular: Negative for chest pain, palpitations and leg swelling.  Gastrointestinal: Negative for abdominal pain, blood in stool, constipation, diarrhea, nausea and vomiting.  Genitourinary: Negative for dysuria, flank pain and hematuria.  Musculoskeletal: Negative for back pain and myalgias.  Skin: Negative for rash.  Neurological: Negative for dizziness, weakness and headaches.  Hematological: Does not bruise/bleed easily.  Psychiatric/Behavioral: Negative for suicidal ideas.       Objective:   Physical Exam  Constitutional: He is oriented to person, place, and time. He appears well-developed and well-nourished.  HENT:  Head: Normocephalic and atraumatic.  Eyes: Conjunctivae and EOM are normal.  Neck: Normal range of motion. Neck supple.  Cardiovascular: Normal rate, regular rhythm and normal heart sounds.  Exam reveals no gallop and no friction rub.   No murmur heard. Pulmonary/Chest: Effort normal. No respiratory distress. He has decreased breath sounds. He has no wheezes. He has rales.  Abdominal: Soft. He exhibits no distension.  Musculoskeletal: Normal range of motion. He exhibits no edema or tenderness.  Neurological: He is alert and oriented to person, place, and time.  Skin: Skin is warm and dry. No rash  noted. No erythema. No pallor.          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   We will ask him to give a 2nd sample for AFB culture  We will start RIFampin, azithromcyin and ETH daily   He will meet with ID pharmacy in a few weeks and then with me in 2 months  CAD on statin: interaction with rifampin reviewed with ID pharmacy  COPD with bullous changes and new cavities in lungs suspicious for infection: we are going to assume these are due to M avium at present and see how he does likely  with re-imaging down the road  STI testing: checking HCV and HIV  We spent greater than 60 minutes with the patient including greater than 50% of time in face to face counsel of the patient re nature of M avium pulmonary infection, new antibiotics, possible side effects, his CAD, COPD and testing for HIV and HCVand in coordination of his care.

## 2016-06-24 NOTE — Progress Notes (Signed)
HPI: [name] is a 70 y.o. male who presents to the clinic today to see Dr. Tommy Medal regarding his MAC pulmonary infection.   Allergies: Allergies  Allergen Reactions  . Other Other (See Comments)    Hydromet cough syrup-causes GI upset  . Codeine Nausea And Vomiting    Past Medical History: Past Medical History:  Diagnosis Date  . Acute MI ([place])   . Arthritis of lumbar spine   . Asthma   . Clotting disorder ([place])   . Collapsed lung    x3  . COPD (chronic obstructive pulmonary disease) ([place])   . Dyspnea   . History of benign thyroid tumor   . Lung cancer ([place])   . Mycobacterium avium complex ([place]) 06/24/2016  . Pneumonia   . Pneumothorax   . Small cell carcinoma of lung ([place])   . Spontaneous pneumothorax     Social History: Social History   Social History  . Marital status: Married    Spouse name: N/A  . Number of children: 1  . Years of education: N/A   Occupational History  . 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 Smoking status: Former Smoker    Packs/day: 3.00    Years: 30.00    Types: Cigars    Quit date: 03/02/1992  . Smokeless tobacco: Not on file     Comment: quit 22 yrs ago  . Alcohol use No  . Drug use: No  . Sexual activity: Not on file   Other Topics Concern  . Not on file   Social History Narrative  . No narrative on file    CrCl: CrCl cannot be calculated (Patient's most recent lab result is older than the maximum 21 days allowed.).  Lipids: No results found for: CHOL, TRIG, HDL, CHOLHDL, VLDL, LDLCALC  Assessment: [name] is here for a referral from pulmonary for treatment of his MAC pulmonary infection.  Dr. Tommy Medal will start him on azithromycin + rifampin + ethambutol.  I met with [name] and explained his medications to him.  I also ran a drug interaction check with all his medications.  The only interacting medications are the rifampin and atorvastatin.  He is only on atorvastatin 10 mg, so I educated  him on the side effects including myopathy and muscle pain, and we will monitor from here. I printed out patient information and education on his diagnosis and medications. I made a follow-up appt with pharmacy in 3 weeks and went ahead and scheduled him with Dr. Tommy Medal.   Plans: - Start rifampin 600 mg PO daily (2 capsules) - Start ethambutol 1,200 mg PO daily (3 tablets) - Start azithromycin 500 mg PO daily (1 tablet) - F/u with [facility] pharmacist 10/16 at 930am - F/u with Dr. Tommy Medal 12/11 at 3:30pm  Luke Falero L. Donnajean Lopes, PharmD Infectious [place] 06/24/2016, 10:12 AM

## 2016-06-25 LAB — SEDIMENTATION RATE: SED RATE: 42 mm/h — AB (ref 0–20)

## 2016-06-25 LAB — HIV ANTIBODY (ROUTINE TESTING W REFLEX): HIV 1&2 Ab, 4th Generation: NONREACTIVE

## 2016-06-26 ENCOUNTER — Other Ambulatory Visit: Payer: Medicare Other

## 2016-06-26 ENCOUNTER — Other Ambulatory Visit: Payer: Self-pay | Admitting: *Deleted

## 2016-06-26 DIAGNOSIS — A310 Pulmonary mycobacterial infection: Secondary | ICD-10-CM

## 2016-06-28 ENCOUNTER — Emergency Department: Payer: Medicare Other

## 2016-06-28 ENCOUNTER — Observation Stay
Admission: EM | Admit: 2016-06-28 | Discharge: 2016-06-30 | Disposition: A | Discharge status: Home / Self Care | Payer: Medicare Other | Attending: Internal Medicine | Admitting: Internal Medicine

## 2016-06-28 ENCOUNTER — Encounter: Payer: Self-pay | Admitting: Emergency Medicine

## 2016-06-28 DIAGNOSIS — T368X5A Adverse effect of other systemic antibiotics, initial encounter: Secondary | ICD-10-CM | POA: Diagnosis not present

## 2016-06-28 DIAGNOSIS — R0602 Shortness of breath: Secondary | ICD-10-CM | POA: Diagnosis not present

## 2016-06-28 DIAGNOSIS — I10 Essential (primary) hypertension: Secondary | ICD-10-CM | POA: Diagnosis not present

## 2016-06-28 DIAGNOSIS — R197 Diarrhea, unspecified: Secondary | ICD-10-CM | POA: Diagnosis not present

## 2016-06-28 DIAGNOSIS — Z86711 Personal history of pulmonary embolism: Secondary | ICD-10-CM | POA: Insufficient documentation

## 2016-06-28 DIAGNOSIS — A310 Pulmonary mycobacterial infection: Secondary | ICD-10-CM | POA: Diagnosis not present

## 2016-06-28 DIAGNOSIS — E871 Hypo-osmolality and hyponatremia: Secondary | ICD-10-CM | POA: Insufficient documentation

## 2016-06-28 DIAGNOSIS — Z85118 Personal history of other malignant neoplasm of bronchus and lung: Secondary | ICD-10-CM | POA: Diagnosis not present

## 2016-06-28 DIAGNOSIS — R110 Nausea: Secondary | ICD-10-CM | POA: Insufficient documentation

## 2016-06-28 DIAGNOSIS — R1013 Epigastric pain: Secondary | ICD-10-CM | POA: Diagnosis not present

## 2016-06-28 DIAGNOSIS — Z86718 Personal history of other venous thrombosis and embolism: Secondary | ICD-10-CM | POA: Insufficient documentation

## 2016-06-28 DIAGNOSIS — J432 Centrilobular emphysema: Secondary | ICD-10-CM | POA: Diagnosis not present

## 2016-06-28 DIAGNOSIS — E785 Hyperlipidemia, unspecified: Secondary | ICD-10-CM | POA: Diagnosis not present

## 2016-06-28 DIAGNOSIS — Z7982 Long term (current) use of aspirin: Secondary | ICD-10-CM | POA: Insufficient documentation

## 2016-06-28 DIAGNOSIS — D649 Anemia, unspecified: Secondary | ICD-10-CM | POA: Diagnosis not present

## 2016-06-28 DIAGNOSIS — M1990 Unspecified osteoarthritis, unspecified site: Secondary | ICD-10-CM | POA: Insufficient documentation

## 2016-06-28 DIAGNOSIS — T366X5A Adverse effect of rifampicins, initial encounter: Secondary | ICD-10-CM | POA: Insufficient documentation

## 2016-06-28 DIAGNOSIS — J441 Chronic obstructive pulmonary disease with (acute) exacerbation: Principal | ICD-10-CM | POA: Insufficient documentation

## 2016-06-28 DIAGNOSIS — Z87891 Personal history of nicotine dependence: Secondary | ICD-10-CM | POA: Insufficient documentation

## 2016-06-28 DIAGNOSIS — T363X5A Adverse effect of macrolides, initial encounter: Secondary | ICD-10-CM | POA: Diagnosis not present

## 2016-06-28 DIAGNOSIS — Z79899 Other long term (current) drug therapy: Secondary | ICD-10-CM | POA: Diagnosis not present

## 2016-06-28 DIAGNOSIS — I2510 Atherosclerotic heart disease of native coronary artery without angina pectoris: Secondary | ICD-10-CM | POA: Insufficient documentation

## 2016-06-28 DIAGNOSIS — I252 Old myocardial infarction: Secondary | ICD-10-CM | POA: Insufficient documentation

## 2016-06-28 DIAGNOSIS — R05 Cough: Secondary | ICD-10-CM | POA: Diagnosis not present

## 2016-06-28 DIAGNOSIS — D689 Coagulation defect, unspecified: Secondary | ICD-10-CM | POA: Insufficient documentation

## 2016-06-28 LAB — CBC WITH DIFFERENTIAL/PLATELET
BASOS PCT: 1 %
Basophils Absolute: 0.1 10*3/uL (ref 0–0.1)
EOS ABS: 0.5 10*3/uL (ref 0–0.7)
EOS PCT: 4 %
HCT: 27.3 % — ABNORMAL LOW (ref 40.0–52.0)
Hemoglobin: 9.2 g/dL — ABNORMAL LOW (ref 13.0–18.0)
LYMPHS ABS: 0.8 10*3/uL — AB (ref 1.0–3.6)
Lymphocytes Relative: 6 %
MCH: 29.3 pg (ref 26.0–34.0)
MCHC: 33.8 g/dL (ref 32.0–36.0)
MCV: 86.7 fL (ref 80.0–100.0)
MONOS PCT: 8 %
Monocytes Absolute: 1.1 10*3/uL — ABNORMAL HIGH (ref 0.2–1.0)
Neutro Abs: 11.1 10*3/uL — ABNORMAL HIGH (ref 1.4–6.5)
Neutrophils Relative %: 81 %
PLATELETS: 344 10*3/uL (ref 150–440)
RBC: 3.15 MIL/uL — ABNORMAL LOW (ref 4.40–5.90)
RDW: 16.1 % — ABNORMAL HIGH (ref 11.5–14.5)
WBC: 13.5 10*3/uL — AB (ref 3.8–10.6)

## 2016-06-28 LAB — COMPREHENSIVE METABOLIC PANEL
ALT: 21 U/L (ref 17–63)
ANION GAP: 9 (ref 5–15)
AST: 23 U/L (ref 15–41)
Albumin: 3.3 g/dL — ABNORMAL LOW (ref 3.5–5.0)
Alkaline Phosphatase: 52 U/L (ref 38–126)
BUN: 18 mg/dL (ref 6–20)
CHLORIDE: 98 mmol/L — AB (ref 101–111)
CO2: 23 mmol/L (ref 22–32)
Calcium: 9.4 mg/dL (ref 8.9–10.3)
Creatinine, Ser: 1.18 mg/dL (ref 0.61–1.24)
Glucose, Bld: 112 mg/dL — ABNORMAL HIGH (ref 65–99)
POTASSIUM: 3.5 mmol/L (ref 3.5–5.1)
SODIUM: 130 mmol/L — AB (ref 135–145)
Total Bilirubin: 0.8 mg/dL (ref 0.3–1.2)
Total Protein: 6.8 g/dL (ref 6.5–8.1)

## 2016-06-28 LAB — PHOSPHORUS: PHOSPHORUS: 3.7 mg/dL (ref 2.5–4.6)

## 2016-06-28 LAB — TROPONIN I

## 2016-06-28 LAB — MAGNESIUM: MAGNESIUM: 1.6 mg/dL — AB (ref 1.7–2.4)

## 2016-06-28 MED ORDER — MAGNESIUM SULFATE 2 GM/50ML IV SOLN
2.0000 g | Freq: Once | INTRAVENOUS | Status: AC
  Administered 2016-06-28: 2 g via INTRAVENOUS
  Filled 2016-06-28: qty 50

## 2016-06-28 MED ORDER — IPRATROPIUM-ALBUTEROL 0.5-2.5 (3) MG/3ML IN SOLN
3.0000 mL | Freq: Once | RESPIRATORY_TRACT | Status: AC
  Administered 2016-06-28: 3 mL via RESPIRATORY_TRACT
  Filled 2016-06-28: qty 3

## 2016-06-28 MED ORDER — ETHAMBUTOL HCL 400 MG PO TABS
1200.0000 mg | ORAL_TABLET | Freq: Every day | ORAL | Status: DC
  Administered 2016-06-29 – 2016-06-30 (×2): 1200 mg via ORAL
  Filled 2016-06-28 (×3): qty 3

## 2016-06-28 MED ORDER — IPRATROPIUM-ALBUTEROL 0.5-2.5 (3) MG/3ML IN SOLN
3.0000 mL | Freq: Four times a day (QID) | RESPIRATORY_TRACT | Status: DC | PRN
  Administered 2016-06-29 (×2): 3 mL via RESPIRATORY_TRACT
  Filled 2016-06-28 (×2): qty 3

## 2016-06-28 MED ORDER — BISACODYL 5 MG PO TBEC: 5.0000 mg | DELAYED_RELEASE_TABLET | Freq: Every day | ORAL | Status: DC | PRN

## 2016-06-28 MED ORDER — AZITHROMYCIN 500 MG PO TABS
500.0000 mg | ORAL_TABLET | Freq: Every day | ORAL | Status: DC
  Administered 2016-06-29 – 2016-06-30 (×2): 500 mg via ORAL
  Filled 2016-06-28 (×2): qty 1

## 2016-06-28 MED ORDER — ONDANSETRON HCL 4 MG PO TABS
4.0000 mg | ORAL_TABLET | Freq: Four times a day (QID) | ORAL | Status: DC | PRN
  Administered 2016-06-30: 4 mg via ORAL
  Filled 2016-06-28: qty 1

## 2016-06-28 MED ORDER — SENNOSIDES-DOCUSATE SODIUM 8.6-50 MG PO TABS: 1.0000 | ORAL_TABLET | Freq: Every evening | ORAL | Status: DC | PRN

## 2016-06-28 MED ORDER — ALBUTEROL SULFATE (2.5 MG/3ML) 0.083% IN NEBU
5.0000 mg | INHALATION_SOLUTION | Freq: Once | RESPIRATORY_TRACT | Status: AC
Start: 2016-06-28 — End: 2016-06-28
  Administered 2016-06-28: 5 mg via RESPIRATORY_TRACT
  Filled 2016-06-28: qty 6

## 2016-06-28 MED ORDER — CHLORTHALIDONE 25 MG PO TABS
25.0000 mg | ORAL_TABLET | Freq: Every day | ORAL | Status: DC
  Administered 2016-06-29 – 2016-06-30 (×2): 25 mg via ORAL
  Filled 2016-06-28 (×2): qty 1

## 2016-06-28 MED ORDER — MOMETASONE FURO-FORMOTEROL FUM 100-5 MCG/ACT IN AERO
2.0000 | INHALATION_SPRAY | Freq: Two times a day (BID) | RESPIRATORY_TRACT | Status: DC
  Administered 2016-06-29 – 2016-06-30 (×4): 2 via RESPIRATORY_TRACT
  Filled 2016-06-28: qty 8.8

## 2016-06-28 MED ORDER — ALBUTEROL SULFATE (2.5 MG/3ML) 0.083% IN NEBU
5.0000 mg | INHALATION_SOLUTION | Freq: Once | RESPIRATORY_TRACT | Status: AC
  Administered 2016-06-28: 5 mg via RESPIRATORY_TRACT
  Filled 2016-06-28: qty 6

## 2016-06-28 MED ORDER — IOPAMIDOL (ISOVUE-370) INJECTION 76%
75.0000 mL | Freq: Once | INTRAVENOUS | Status: AC | PRN
  Administered 2016-06-28: 75 mL via INTRAVENOUS

## 2016-06-28 MED ORDER — METHYLPREDNISOLONE SODIUM SUCC 125 MG IJ SOLR
60.0000 mg | Freq: Four times a day (QID) | INTRAMUSCULAR | Status: DC
  Administered 2016-06-29 – 2016-06-30 (×6): 60 mg via INTRAVENOUS
  Filled 2016-06-28 (×6): qty 2

## 2016-06-28 MED ORDER — LOSARTAN POTASSIUM 50 MG PO TABS
100.0000 mg | ORAL_TABLET | Freq: Every day | ORAL | Status: DC
  Administered 2016-06-29 – 2016-06-30 (×2): 100 mg via ORAL
  Filled 2016-06-28 (×2): qty 2

## 2016-06-28 MED ORDER — GUAIFENESIN ER 600 MG PO TB12
600.0000 mg | ORAL_TABLET | Freq: Every day | ORAL | Status: DC
  Administered 2016-06-29 – 2016-06-30 (×2): 600 mg via ORAL
  Filled 2016-06-28 (×2): qty 1

## 2016-06-28 MED ORDER — ONDANSETRON HCL 4 MG/2ML IJ SOLN
4.0000 mg | Freq: Four times a day (QID) | INTRAMUSCULAR | Status: DC | PRN
Start: 2016-06-28 — End: 2016-06-30
  Administered 2016-06-29 – 2016-06-30 (×4): 4 mg via INTRAVENOUS
  Filled 2016-06-28 (×5): qty 2

## 2016-06-28 MED ORDER — MAGNESIUM CITRATE PO SOLN: 1.0000 | Freq: Once | ORAL | Status: DC | PRN

## 2016-06-28 MED ORDER — SODIUM CHLORIDE 0.9 % IV SOLN
INTRAVENOUS | Status: DC
  Administered 2016-06-29: 01:00:00 via INTRAVENOUS

## 2016-06-28 MED ORDER — METHYLPREDNISOLONE SODIUM SUCC 125 MG IJ SOLR
80.0000 mg | Freq: Once | INTRAMUSCULAR | Status: AC
  Administered 2016-06-28: 80 mg via INTRAVENOUS
  Filled 2016-06-28: qty 2

## 2016-06-28 MED ORDER — ENOXAPARIN SODIUM 40 MG/0.4ML ~~LOC~~ SOLN
40.0000 mg | Freq: Every day | SUBCUTANEOUS | Status: DC
  Administered 2016-06-29 (×2): 40 mg via SUBCUTANEOUS
  Filled 2016-06-28 (×2): qty 0.4

## 2016-06-28 MED ORDER — INFLUENZA VAC SPLIT QUAD 0.5 ML IM SUSY
0.5000 mL | PREFILLED_SYRINGE | INTRAMUSCULAR | Status: AC
  Administered 2016-06-29: 0.5 mL via INTRAMUSCULAR
  Filled 2016-06-28: qty 0.5

## 2016-06-28 MED ORDER — RIFAMPIN 300 MG PO CAPS
600.0000 mg | ORAL_CAPSULE | Freq: Every day | ORAL | Status: DC
  Administered 2016-06-29 – 2016-06-30 (×2): 600 mg via ORAL
  Filled 2016-06-28 (×2): qty 2

## 2016-06-28 MED ORDER — ATORVASTATIN CALCIUM 20 MG PO TABS
10.0000 mg | ORAL_TABLET | Freq: Every day | ORAL | Status: DC
Start: 2016-06-29 — End: 2016-06-30
  Administered 2016-06-29 – 2016-06-30 (×2): 10 mg via ORAL
  Filled 2016-06-28 (×2): qty 1

## 2016-06-28 MED ORDER — ACETAMINOPHEN 325 MG PO TABS
650.0000 mg | ORAL_TABLET | Freq: Four times a day (QID) | ORAL | Status: DC | PRN
Start: 2016-06-28 — End: 2016-06-30

## 2016-06-28 MED ORDER — ZOLPIDEM TARTRATE 5 MG PO TABS: 5.0000 mg | ORAL_TABLET | Freq: Every evening | ORAL | Status: DC | PRN

## 2016-06-28 MED ORDER — TRAMADOL HCL 50 MG PO TABS
100.0000 mg | ORAL_TABLET | Freq: Four times a day (QID) | ORAL | Status: DC | PRN
  Administered 2016-06-29 – 2016-06-30 (×4): 100 mg via ORAL
  Filled 2016-06-28 (×4): qty 2

## 2016-06-28 MED ORDER — ASPIRIN EC 81 MG PO TBEC
81.0000 mg | DELAYED_RELEASE_TABLET | Freq: Every day | ORAL | Status: DC
  Administered 2016-06-29 – 2016-06-30 (×2): 81 mg via ORAL
  Filled 2016-06-28 (×2): qty 1

## 2016-06-28 MED ORDER — ACETAMINOPHEN 650 MG RE SUPP: 650.0000 mg | Freq: Four times a day (QID) | RECTAL | Status: DC | PRN

## 2016-06-28 NOTE — Care Management Obs Status (Signed)
[place] NOTIFICATION   Patient Details  Name: [name] MRN: [id number] Date of Birth: 02-09-1946   Medicare Observation Status Notification Given:  Yes    CrutchfieldAntony Haste, RN 06/28/2016, 9:41 PM

## 2016-06-28 NOTE — ED Triage Notes (Signed)
Pt was dx with mycobacterium avium complex non tb related with hiv per paperwork. Pt understands that it will take up to 24 months of antibiotics that he is currently on to help fight the infection. Pt states feeling short of breath today. Vss.

## 2016-06-28 NOTE — ED Notes (Signed)
Patient left for CT scan. 

## 2016-06-28 NOTE — H&P (Signed)
[place] @ [facility] Admission History and Physical [employer], D.O.  ---------------------------------------------------------------------------------------------------------------------   PATIENT NAME: [name] MR#: [id number] DATE OF BIRTH: 1946-08-09 DATE OF ADMISSION: 06/28/2016 PRIMARY CARE PHYSICIAN: Asencion Noble, MD  REQUESTING/REFERRING PHYSICIAN: ED Dr. Quentin Cornwall  CHIEF COMPLAINT: Chief Complaint  Patient presents with  . Shortness of Breath    HISTORY OF PRESENT ILLNESS: [name] is a 70 y.o. male with a known history of COPD, currently being treated for MAC, History of PE and DVT presents to the emergency department complaining of shortness of breath.  Patient is short of breath at baseline, but experienced worsening shortness of breath in the past 24 hours without relief from his home regimen of nebulizers.    Otherwise there has been no change in status. Patient has been taking medication as prescribed and there has been no recent change in medication or diet.  There has been no recent illness, travel or sick contacts.    Patient denies fevers/chills, weakness, dizziness, chest pain, shortness of breath, N/V/C/D, abdominal pain, dysuria/frequency, changes in mental status.   EMS/ED COURSE:   Patient received nebs and solumedrol.   PAST MEDICAL HISTORY: Past Medical History:  Diagnosis Date  . Acute MI   . Arthritis of lumbar spine ([place])   . Asthma   . Clotting disorder ([place])   . Collapsed lung    x3  . COPD (chronic obstructive pulmonary disease) ([place])   . Dyspnea   . History of benign thyroid tumor   . Lung cancer ([place])   . Mycobacterium avium complex ([place]) 06/24/2016  . Pneumonia   . Pneumothorax   . Small cell carcinoma of lung ([place])   . Spontaneous pneumothorax       PAST SURGICAL HISTORY: Past Surgical History:  Procedure Laterality Date  . APPENDECTOMY    . benign thryoid nodule resected    . CARDIAC CATHETERIZATION   2013   [facility]  . CHEST TUBE INSERTION    . COLONOSCOPY N/A 08/30/2015   Procedure: COLONOSCOPY;  Surgeon: Rogene Houston, MD;  Location[facility];  Service: Endoscopy;  Laterality: N/A;  1:45  . left upper lobectomy for small cell lung cancer    . VATS R thoracotomy        SOCIAL HISTORY: Social History  Substance Use Topics  . Smoking status: Former Smoker    Packs/day: 3.00    Years: 30.00    Types: Cigars    Quit date: 03/02/1992  . Smokeless tobacco: Never Used     Comment: quit 22 yrs ago  . Alcohol use No      FAMILY HISTORY: Family History  Problem Relation Age of Onset  . Heart disease Mother   . Colon cancer Father   . Heart failure Brother      MEDICATIONS AT HOME: Prior to Admission medications   Medication Sig Start Date End Date Taking? Authorizing Provider  acetaminophen (TYLENOL) 325 MG tablet Take 650 mg by mouth as needed.     Yes Historical Provider, MD  albuterol (PROVENTIL HFA;VENTOLIN HFA) 108 (90 Base) MCG/ACT inhaler Inhale 2 puffs into the lungs every 6 (six) hours as needed for wheezing or shortness of breath. 03/02/16  Yes Deneise Lever, MD  aspirin 81 MG EC tablet Take 81 mg by mouth daily. Swallow whole.   Yes Historical Provider, MD  atorvastatin (LIPITOR) 10 MG tablet TAKE 1 TABLET BY MOUTH EVERY DAY 06/17/15  Yes Minna Merritts, MD  azithromycin [facility])  500 MG tablet Take 1 tablet (500 mg total) by mouth daily. 06/24/16  Yes Truman Hayward, MD  chlorthalidone (HYGROTON) 25 MG tablet Take 25 mg by mouth daily. 02/01/15  Yes Historical Provider, MD  ethambutol (MYAMBUTOL) 400 MG tablet Take 3 tablets (1,200 mg total) by mouth daily. 06/24/16  Yes Truman Hayward, MD  Fluticasone-Salmeterol (ADVAIR DISKUS) 100-50 MCG/DOSE AEPB Inhale 1 puff into the lungs 2 (two) times daily. 03/30/16  Yes Deneise Lever, MD  guaiFENesin (MUCINEX) 600 MG 12 hr tablet Take 600 mg by mouth daily.    Yes Historical Provider, MD  losartan (COZAAR) 100 MG  tablet Take 100 mg by mouth daily. 11/01/14  Yes Historical Provider, MD  rifampin (RIFADIN) 300 MG capsule Take 2 capsules (600 mg total) by mouth daily. 06/24/16  Yes Truman Hayward, MD  traMADol (ULTRAM) 50 MG tablet Takes 2 tablets 4 times daily as needed for pain. 05/25/11  Yes Historical Provider, MD      DRUG ALLERGIES: Allergies  Allergen Reactions  . Other Other (See Comments)    Hydromet cough syrup-causes GI upset  . Codeine Nausea And Vomiting     REVIEW OF SYSTEMS: CONSTITUTIONAL: No fatigue, weakness, fever, chills, weight gain/loss, headache EYES: No blurry or double vision. ENT: No tinnitus, postnasal drip, redness or soreness of the oropharynx. RESPIRATORY: Positive dyspnea, cough, wheeze, Negative hemoptysis. CARDIOVASCULAR: No chest pain, orthopnea, palpitations, syncope. GASTROINTESTINAL: No nausea, vomiting, constipation, diarrhea, abdominal pain. No hematemesis, melena or hematochezia. GENITOURINARY: No dysuria, frequency, hematuria. ENDOCRINE: No polyuria or nocturia. No heat or cold intolerance. HEMATOLOGY: No anemia, bruising, bleeding. INTEGUMENTARY: No rashes, ulcers, lesions. MUSCULOSKELETAL: No pain, arthritis, swelling, gout. NEUROLOGIC: No numbness, tingling, weakness or ataxia. No seizure-type activity. PSYCHIATRIC: No anxiety, depression, insomnia.  PHYSICAL EXAMINATION: VITAL SIGNS: Blood pressure (!) 148/69, pulse (!) 110, temperature 99 F (37.2 C), temperature source Oral, resp. rate 18, height '5\' 7"'$  (1.702 m), weight 71.7 kg (158 lb), SpO2 100 %.  GENERAL: 70 y.o.-year-old Male patient, well-developed, well-nourished lying in the bed in no acute distress.  Pleasant and cooperative.   HEENT: Head atraumatic, normocephalic. Pupils equal, round, reactive to light and accommodation. No scleral icterus. Extraocular muscles intact. Oropharynx is clear. Mucus membranes moist. NECK: Supple, full range of motion. No JVD, no bruit heard. No cervical  lymphadenopathy. CHEST: Bibasilar rhonchi with wheezes.  No use of accessory muscles of respiration.  No reproducible chest wall tenderness.  CARDIOVASCULAR: S1, S2 normal. No murmurs, rubs, or gallops appreciated. Cap refill <2 seconds. ABDOMEN: Soft, nontender, nondistended. No rebound, guarding, rigidity. Normoactive bowel sounds present in all four quadrants. No organomegaly or mass. EXTREMITIES: Full range of motion. No pedal edema, cyanosis, or clubbing. NEUROLOGIC: Cranial nerves II through XII are grossly intact with no focal sensorimotor deficit. Muscle strength 5/5 in all extremities. Sensation intact. Gait not checked. PSYCHIATRIC: The patient is alert and oriented x 3. Normal affect, mood, thought content. SKIN: Warm, dry, and intact without obvious rash, lesion, or ulcer.  LABORATORY PANEL:  CBC  Recent Labs Lab 06/28/16 1646  WBC 13.5*  HGB 9.2*  HCT 27.3*  PLT 344   ----------------------------------------------------------------------------------------------------------------- Chemistries  Recent Labs Lab 06/28/16 1646  NA 130*  K 3.5  CL 98*  CO2 23  GLUCOSE 112*  BUN 18  CREATININE 1.18  CALCIUM 9.4  MG 1.6*  AST 23  ALT 21  ALKPHOS 52  BILITOT 0.8   ------------------------------------------------------------------------------------------------------------------ Cardiac Enzymes  Recent Labs Lab  06/28/16 1646  TROPONINI <0.03   ------------------------------------------------------------------------------------------------------------------  RADIOLOGY: Dg Chest 2 View  Result Date: 06/28/2016 CLINICAL DATA:  Cough EXAM: CHEST  2 VIEW COMPARISON:  06/10/2016 FINDINGS: Chronic lung disease including emphysema and scattered bronchiectasis/scarring. Chronic opacity at the left base with fluid level. No progressive airspace opacity, pneumothorax, or pleural effusion. Normal heart size and mediastinal contours. Postoperative left chest. IMPRESSION: 1.  Stable compared to prior. 2. Chronic lung disease including emphysema and bronchiectasis with known fluid-filled cavity at the left base. Electronically Signed   By: Monte Fantasia M.D.   On: 06/28/2016 17:05   Ct Angio Chest Pe W And/or Wo Contrast  Result Date: 06/28/2016 CLINICAL DATA:  Increasing shortness of Breath and history of mycobacterial infection pain and previous lung carcinoma EXAM: CT ANGIOGRAPHY CHEST WITH CONTRAST TECHNIQUE: Multidetector CT imaging of the chest was performed using the standard protocol during bolus administration of intravenous contrast. Multiplanar CT image reconstructions and MIPs were obtained to evaluate the vascular anatomy. CONTRAST:  75 mL Isovue 370. COMPARISON:  05/19/2016 FINDINGS: Cardiovascular: Aortic atherosclerotic change is noted without aneurysmal dilatation or dissection. Minimal coronary calcifications are seen. The pulmonary artery shows a normal branching pattern without filling defects to suggest pulmonary embolism. Mediastinum/Nodes: Left thyroid goiter is noted at the thoracic inlet stable from the prior exam. No discrete nodule is seen. Previously seen AP window node now measures 1.9 cm in short axis increased in size from the prior exam a which time it measured 1.4 cm. No significant hilar lymphadenopathy is noted. Lungs/Pleura: Chronic emphysematous changes are again identified within both lungs. Chronic scarring in the right base is noted. A small left-sided pleural effusion is seen increased from the prior study. There remains a thickened cavitary lesion in the left lower lobe with air-fluid level within likely representing an infected bulla. Scattered smaller cavitary lesions are noted throughout the left lung with some slight increase in wall thickness when compared with the prior exam. Changes of prior left upper lobectomy are noted. Upper Abdomen: Within normal limits. Musculoskeletal: Degenerative changes of the thoracic spine are again noted.  Review of the MIP images confirms the above findings. IMPRESSION: No evidence of pulmonary emboli. Stable left thyroid goiter. Slight enlargement of AP window lymph node from 1.4 to 1.9 cm. Persistent thick walled cavitary lesion in the left lower lobe. Some slight increase in left pleural effusion is noted as well as some increase in the degree of wall thickening of smaller cavitary lesions likely representing some progressive inflammatory change. Electronically Signed   By: Inez Catalina M.D.   On: 06/28/2016 19:37    EKG: Normal sinus rhythm at 90 bpm with normal axis and nonspecific ST-T wave changes.   IMPRESSION AND PLAN:  This is a 70 y.o. male with a history of centrilobular emphysema , recent diagnosis of MAC, history of DVT, PE now being admitted with: 1.  COPD exacerbation-admit to observation for IV steroids, nebs, O2 therapy. We'll check sputum culture. 2. Anemia-stable from 4 days ago but no other prior labs for comparison. We'll check iron studies, B12 folate and stool guaiac 3. hyponatremia, mild-IV normal saline 4. History of MAC-continue triple therapy with a Zithromax, ethambutol and rifampin 5. History of coronary artery disease-continue aspirin 6. History of hyperlipidemia-continue Lipitor 7. History of hypertension-continue chlorthalidone, losartan 8. History of arthritis-continue tramadol   Diet/Nutrition: heart healthy Fluids: IV normal saline DVT Px: Lovenox, SCDs and early ambulation Code Status: Full  All the records are reviewed and case  discussed with ED provider. Management plans discussed with the patient and/or family who express understanding and agree with plan of care.   TOTAL TIME TAKING CARE OF THIS PATIENT: 50 minutes.   Marisabel Macpherson D.O. on 06/28/2016 at 11:33 PM Between 7am to 6pm - Pager - [phone] After 6pm go to [email or web address] - [employer]reader [employer] [facility] [place] [facility] Office [phone] CC: Primary care physician;  Asencion Noble, MD     Note: This dictation was prepared with Dragon dictation along with smaller phrase technology. Any transcriptional errors that result from this process are unintentional.

## 2016-06-28 NOTE — ED Provider Notes (Signed)
[facility] Emergency Department Provider Note    First MD Initiated Contact with Patient 06/28/16 1653     (approximate)  I have reviewed the triage vital signs and the nursing notes.   HISTORY  Chief Complaint Shortness of Breath    HPI [name] is a 70 y.o. male 24 hours of worsening shortness of breath and exertional dyspnea. Patient is being reportedly treated with triple therapy for NAC. Patient with severe emphysema. Denies any chest pain. States that he has had several months of chronic shortness of breath and weakness but today became acutely worse. Is on multiple at home nebulizers without any improvement. Denies any fevers.   Past Medical History:  Diagnosis Date  . Acute MI   . Arthritis of lumbar spine ([place])   . Asthma   . Clotting disorder ([place])   . Collapsed lung    x3  . COPD (chronic obstructive pulmonary disease) ([place])   . Dyspnea   . History of benign thyroid tumor   . Lung cancer ([place])   . Mycobacterium avium complex ([place]) 06/24/2016  . Pneumonia   . Pneumothorax   . Small cell carcinoma of lung ([place])   . Spontaneous pneumothorax     Patient Active Problem List   Diagnosis Date Noted  . Mycobacterium avium complex ([place]) 06/24/2016  . Bronchiectasis without acute exacerbation ([place]) 05/20/2016  . Lung disease, bullous ([place]) 05/19/2016  . Peripheral edema 12/30/2014  . Encounter for therapeutic drug monitoring 03/15/2014  . Mediastinal adenopathy 01/26/2014  . Tick bite of axillary region 12/26/2013  . Pulmonary embolus ([place]) 12/22/2013  . PNA (pneumonia) 12/22/2013  . Hyperlipidemia 12/26/2012  . NSTEMI (non-ST elevated myocardial infarction) ([place]) 06/03/2012  . HTN (hypertension) 06/03/2012  . Adenocarcinoma of left lung ([place]) 04/30/2008  . DYSPNEA 11/18/2007  . COPD mixed type ([place]) 11/06/2007  . SPONTANEOUS PNEUMOTHORAX 10/31/2007    Past Surgical History:  Procedure Laterality Date  . APPENDECTOMY    .  benign thryoid nodule resected    . CARDIAC CATHETERIZATION  2013   [facility]  . CHEST TUBE INSERTION    . COLONOSCOPY N/A 08/30/2015   Procedure: COLONOSCOPY;  Surgeon: Rogene Houston, MD;  Location: [facility] SUITE;  Service: Endoscopy;  Laterality: N/A;  1:45  . left upper lobectomy for small cell lung cancer    . VATS R thoracotomy      Prior to Admission medications   Medication Sig Start Date End Date Taking? Authorizing Provider  acetaminophen (TYLENOL) 325 MG tablet Take 650 mg by mouth as needed.      Historical Provider, MD  albuterol (PROVENTIL HFA;VENTOLIN HFA) 108 (90 Base) MCG/ACT inhaler Inhale 2 puffs into the lungs every 6 (six) hours as needed for wheezing or shortness of breath. 03/02/16   Deneise Lever, MD  aspirin 81 MG EC tablet Take 81 mg by mouth daily. Swallow whole.    Historical Provider, MD  atorvastatin (LIPITOR) 10 MG tablet TAKE 1 TABLET BY MOUTH EVERY DAY 06/17/15   Minna Merritts, MD  azithromycin (ZITHROMAX) 500 MG tablet Take 1 tablet (500 mg total) by mouth daily. 06/24/16   Truman Hayward, MD  chlorthalidone (HYGROTON) 25 MG tablet Take 25 mg by mouth daily. 02/01/15   Historical Provider, MD  ethambutol (MYAMBUTOL) 400 MG tablet Take 3 tablets (1,200 mg total) by mouth daily. 06/24/16   Truman Hayward, MD  Fluticasone-Salmeterol (ADVAIR DISKUS) 100-50 MCG/DOSE AEPB Inhale 1 puff  into the lungs 2 (two) times daily. 03/30/16   Deneise Lever, MD  guaiFENesin (MUCINEX) 600 MG 12 hr tablet Take 600 mg by mouth daily.     Historical Provider, MD  losartan (COZAAR) 100 MG tablet Take 100 mg by mouth daily. 11/01/14   Historical Provider, MD  rifampin (RIFADIN) 300 MG capsule Take 2 capsules (600 mg total) by mouth daily. 06/24/16   Truman Hayward, MD  traMADol (ULTRAM) 50 MG tablet Takes 2 tablets 4 times daily as needed for pain. 05/25/11   Historical Provider, MD    Allergies Other and Codeine  Family History  Problem Relation Age of Onset  . Heart  disease Mother   . Colon cancer Father   . Heart failure Brother     Social History Social History  Substance Use Topics  . Smoking status: Former Smoker    Packs/day: 3.00    Years: 30.00    Types: Cigars    Quit date: 03/02/1992  . Smokeless tobacco: Never Used     Comment: quit 22 yrs ago  . Alcohol use No    Review of Systems Patient denies headaches, rhinorrhea, blurry vision, numbness, shortness of breath, chest pain, edema, cough, abdominal pain, nausea, vomiting, diarrhea, dysuria, fevers, rashes or hallucinations unless otherwise stated above in HPI. ____________________________________________   PHYSICAL EXAM:  VITAL SIGNS: Vitals:   06/28/16 1715 06/28/16 1808  BP: 139/69 131/63  Pulse: 88 90  Resp:  18  Temp:      Constitutional: Alert and oriented. Her respiratory distress Eyes: Conjunctivae are normal. PERRL. EOMI. Head: Atraumatic. Nose: No congestion/rhinnorhea. Mouth/Throat: Mucous membranes are moist.  Oropharynx non-erythematous. Neck: No stridor. Painless ROM. No cervical spine tenderness to palpation Hematological/Lymphatic/Immunilogical: No cervical lymphadenopathy. Cardiovascular: Normal rate, regular rhythm. Grossly normal heart sounds.  Good peripheral circulation. Respiratory: Markedly tachypneic. Diminished breath sounds throughout with rhonchorous breath sounds in left posterior base.. Gastrointestinal: Soft and nontender. No distention. No abdominal bruits. No CVA tenderness. Genitourinary:  Musculoskeletal: No lower extremity tenderness nor edema.  No joint effusions. Neurologic:  Normal speech and language. No gross focal neurologic deficits are appreciated. No gait instability. Skin:  Skin is warm, dry and intact. No rash noted. Psychiatric: Mood and affect are normal. Speech and behavior are normal.  ____________________________________________   LABS (all labs ordered are listed, but only abnormal results are displayed)  Results  for orders placed or performed during the hospital encounter of 06/28/16 (from the past 24 hour(s))  CBC with Differential/Platelet     Status: Abnormal   Collection Time: 06/28/16  4:46 PM  Result Value Ref Range   WBC 13.5 (H) 3.8 - 10.6 K/uL   RBC 3.15 (L) 4.40 - 5.90 MIL/uL   Hemoglobin 9.2 (L) 13.0 - 18.0 g/dL   HCT 27.3 (L) 40.0 - 52.0 %   MCV 86.7 80.0 - 100.0 fL   MCH 29.3 26.0 - 34.0 pg   MCHC 33.8 32.0 - 36.0 g/dL   RDW 16.1 (H) 11.5 - 14.5 %   Platelets 344 150 - 440 K/uL   Neutrophils Relative % 81 %   Neutro Abs 11.1 (H) 1.4 - 6.5 K/uL   Lymphocytes Relative 6 %   Lymphs Abs 0.8 (L) 1.0 - 3.6 K/uL   Monocytes Relative 8 %   Monocytes Absolute 1.1 (H) 0.2 - 1.0 K/uL   Eosinophils Relative 4 %   Eosinophils Absolute 0.5 0 - 0.7 K/uL   Basophils Relative 1 %  Basophils Absolute 0.1 0 - 0.1 K/uL  Magnesium     Status: Abnormal   Collection Time: 06/28/16  4:46 PM  Result Value Ref Range   Magnesium 1.6 (L) 1.7 - 2.4 mg/dL  Comprehensive metabolic panel     Status: Abnormal   Collection Time: 06/28/16  4:46 PM  Result Value Ref Range   Sodium 130 (L) 135 - 145 mmol/L   Potassium 3.5 3.5 - 5.1 mmol/L   Chloride 98 (L) 101 - 111 mmol/L   CO2 23 22 - 32 mmol/L   Glucose, Bld 112 (H) 65 - 99 mg/dL   BUN 18 6 - 20 mg/dL   Creatinine, Ser 1.18 0.61 - 1.24 mg/dL   Calcium 9.4 8.9 - 10.3 mg/dL   Total Protein 6.8 6.5 - 8.1 g/dL   Albumin 3.3 (L) 3.5 - 5.0 g/dL   AST 23 15 - 41 U/L   ALT 21 17 - 63 U/L   Alkaline Phosphatase 52 38 - 126 U/L   Total Bilirubin 0.8 0.3 - 1.2 mg/dL   GFR calc non Af Amer >60 >60 mL/min   GFR calc Af Amer >60 >60 mL/min   Anion gap 9 5 - 15  Troponin I     Status: None   Collection Time: 06/28/16  4:46 PM  Result Value Ref Range   Troponin I <0.03 <0.03 ng/mL   ____________________________________________  EKG My review and personal interpretation at Time: 16:41   Indication: sob  Rate: 90  Rhythm: nsr Axis: normal Other: non  specific st changes, no acute ischemia ____________________________________________  RADIOLOGY  I personally reviewed all radiographic images ordered to evaluate for the above acute complaints and reviewed radiology reports and findings.  These findings were personally discussed with the patient.  Please see medical record for radiology report.  ____________________________________________   PROCEDURES  Procedure(s) performed: none    Critical Care performed: no ____________________________________________   INITIAL IMPRESSION / ASSESSMENT AND PLAN / ED COURSE  Pertinent labs & imaging results that were available during my care of the patient were reviewed by me and considered in my medical decision making (see chart for details).  DDX: Asthma, copd, CHF, pna, ptx, malignancy, Pe, anemia   Flavio G Duba is a 70 y.o. who presents to the ED with extensive severe centrilobular emphysema as well as recent diagnosis of non TB Mac on triple therapy as well as previous history of PE and DVT presents with worsening shortness of breath. Patient afebrile and hemodynamic stable but markedly tachypneic. Chest x-ray ordered to evaluate for pneumothorax, worsening of cavitary lesion, pneumonia shows no acute process. Less consistent with ACS the patient does have a history of cardiac disease. Possible worsening of underlying emphysema. As patient does have right lower extremity swelling with previous history of DVT malignancy do feel patient will require further risk stratification for PE with CT imaging.  The patient will be placed on continuous pulse oximetry and telemetry for monitoring.  Laboratory evaluation will be sent to evaluate for the above complaints.     Clinical Course  Comment By Time  Review of records patient is HIV negative. They're currently working under the presumptive diagnosis of an before meals. Chest x-ray showed looks improved from previous. Presentation possible  secondary to worsening underlying emphysema versus possible PE. We will proceed with CT angiogram to evaluate for pulmonary embolism. Is having some improvement after albuterol. Merlyn Lot, MD 10/01 1815  Patient with persistent tachypnea despite IV steroids IV magnesium  and multiple nebulizer treatments. No hypoxia the patient appears significantly winded even at rest. Do not feel is consistent with ACS. CT imaging does not show evidence of pulmonary embolus but I am concerned for worsening of his underlying emphysema.  Given persistent tachypnea,patient will need admission for additional evaluation and management.  Have discussed with the patient and available family all diagnostics and treatments performed thus far and all questions were answered to the best of my ability. The patient demonstrates understanding and agreement with plan.  Merlyn Lot, MD 10/01 1948     ____________________________________________   FINAL CLINICAL IMPRESSION(S) / ED DIAGNOSES  Final diagnoses:  Centrilobular emphysema ([place])  SOB (shortness of breath)      NEW MEDICATIONS STARTED DURING THIS VISIT:  New Prescriptions   No medications on file     Note:  This document was prepared using Dragon voice recognition software and may include unintentional dictation errors.    Merlyn Lot, MD 06/28/16 2046

## 2016-06-29 DIAGNOSIS — A310 Pulmonary mycobacterial infection: Secondary | ICD-10-CM | POA: Diagnosis not present

## 2016-06-29 DIAGNOSIS — D649 Anemia, unspecified: Secondary | ICD-10-CM | POA: Diagnosis not present

## 2016-06-29 DIAGNOSIS — E871 Hypo-osmolality and hyponatremia: Secondary | ICD-10-CM | POA: Diagnosis not present

## 2016-06-29 DIAGNOSIS — J441 Chronic obstructive pulmonary disease with (acute) exacerbation: Secondary | ICD-10-CM | POA: Diagnosis not present

## 2016-06-29 LAB — BASIC METABOLIC PANEL
Anion gap: 9 (ref 5–15)
BUN: 13 mg/dL (ref 6–20)
CHLORIDE: 98 mmol/L — AB (ref 101–111)
CO2: 23 mmol/L (ref 22–32)
Calcium: 9.2 mg/dL (ref 8.9–10.3)
Creatinine, Ser: 0.88 mg/dL (ref 0.61–1.24)
GFR calc Af Amer: >60 mL/min (ref >60)
GFR calc non Af Amer: >60 mL/min (ref >60)
GLUCOSE: 160 mg/dL — AB (ref 65–99)
POTASSIUM: 3.7 mmol/L (ref 3.5–5.1)
Sodium: 130 mmol/L — ABNORMAL LOW (ref 135–145)

## 2016-06-29 LAB — CBC
HEMATOCRIT: 28 % — AB (ref 40.0–52.0)
Hemoglobin: 9.2 g/dL — ABNORMAL LOW (ref 13.0–18.0)
MCH: 28.3 pg (ref 26.0–34.0)
MCHC: 32.7 g/dL (ref 32.0–36.0)
MCV: 86.4 fL (ref 80.0–100.0)
Platelets: 349 10*3/uL (ref 150–440)
RBC: 3.24 MIL/uL — ABNORMAL LOW (ref 4.40–5.90)
RDW: 16.4 % — AB (ref 11.5–14.5)
WBC: 16.9 10*3/uL — ABNORMAL HIGH (ref 3.8–10.6)

## 2016-06-29 LAB — FOLATE: Folate: 18 ng/mL (ref >5.9)

## 2016-06-29 LAB — FERRITIN: Ferritin: 98 ng/mL (ref 24–336)

## 2016-06-29 LAB — IRON AND TIBC
Iron: 12 ug/dL — ABNORMAL LOW (ref 45–182)
SATURATION RATIOS: 4 % — AB (ref 17.9–39.5)
TIBC: 307 ug/dL (ref 250–450)
UIBC: 295 ug/dL

## 2016-06-29 LAB — VITAMIN B12: Vitamin B-12: 1623 pg/mL — ABNORMAL HIGH (ref 180–914)

## 2016-06-29 MED ORDER — TIOTROPIUM BROMIDE MONOHYDRATE 18 MCG IN CAPS
18.0000 ug | ORAL_CAPSULE | Freq: Every day | RESPIRATORY_TRACT | Status: DC
  Administered 2016-06-29 – 2016-06-30 (×2): 18 ug via RESPIRATORY_TRACT
  Filled 2016-06-29: qty 5

## 2016-06-29 NOTE — Progress Notes (Signed)
[facility] [place] NAME: [name]    MR#:  [id number]  DATE OF BIRTH:  06-05-1946  SUBJECTIVE:  CHIEF COMPLAINT:   Chief Complaint  Patient presents with  . Shortness of Breath      On treatment of MAC- came with SOB and wheezing.   On treatment for COPD, feels little better.  REVIEW OF SYSTEMS:  CONSTITUTIONAL: No fever, fatigue or weakness.  EYES: No blurred or double vision.  EARS, NOSE, AND THROAT: No tinnitus or ear pain.  RESPIRATORY: No cough,positive for shortness of breath, wheezing or hemoptysis.  CARDIOVASCULAR: No chest pain, orthopnea, edema.  GASTROINTESTINAL: No nausea, vomiting, diarrhea or abdominal pain.  GENITOURINARY: No dysuria, hematuria.  ENDOCRINE: No polyuria, nocturia,  HEMATOLOGY: No anemia, easy bruising or bleeding SKIN: No rash or lesion. MUSCULOSKELETAL: No joint pain or arthritis.   NEUROLOGIC: No tingling, numbness, weakness.  PSYCHIATRY: No anxiety or depression.   ROS  DRUG ALLERGIES:   Allergies  Allergen Reactions  . Other Other (See Comments)    Hydromet cough syrup-causes GI upset  . Codeine Nausea And Vomiting    VITALS:  Blood pressure (!) 147/66, pulse (!) 102, temperature 98.1 F (36.7 C), temperature source Oral, resp. rate 20, height '5\' 7"'$  (1.702 m), weight 71.7 kg (158 lb), SpO2 98 %.  PHYSICAL EXAMINATION:  GENERAL:  70 y.o.-year-old patient lying in the bed with no acute distress.  EYES: Pupils equal, round, reactive to light and accommodation. No scleral icterus. Extraocular muscles intact.  HEENT: Head atraumatic, normocephalic. Oropharynx and nasopharynx clear.  NECK:  Supple, no jugular venous distention. No thyroid enlargement, no tenderness.  LUNGS: Normal breath sounds bilaterally, minimal wheezing,no crepitation. No use of accessory muscles of respiration.  CARDIOVASCULAR: S1, S2 normal. No murmurs, rubs, or gallops.  ABDOMEN: Soft, nontender, nondistended. Bowel  sounds present. No organomegaly or mass.  EXTREMITIES: No pedal edema, cyanosis, or clubbing.  NEUROLOGIC: Cranial nerves II through XII are intact. Muscle strength 5/5 in all extremities. Sensation intact. Gait not checked.  PSYCHIATRIC: The patient is alert and oriented x 3.  SKIN: No obvious rash, lesion, or ulcer.   Physical Exam LABORATORY PANEL:   CBC  Recent Labs Lab 06/29/16 0405  WBC 16.9*  HGB 9.2*  HCT 28.0*  PLT 349   ------------------------------------------------------------------------------------------------------------------  Chemistries   Recent Labs Lab 06/28/16 1646 06/29/16 0405  NA 130* 130*  K 3.5 3.7  CL 98* 98*  CO2 23 23  GLUCOSE 112* 160*  BUN 18 13  CREATININE 1.18 0.88  CALCIUM 9.4 9.2  MG 1.6*  --   AST 23  --   ALT 21  --   ALKPHOS 52  --   BILITOT 0.8  --    ------------------------------------------------------------------------------------------------------------------  Cardiac Enzymes  Recent Labs Lab 06/28/16 1646  TROPONINI <0.03   ------------------------------------------------------------------------------------------------------------------  RADIOLOGY:  Dg Chest 2 View  Result Date: 06/28/2016 CLINICAL DATA:  Cough EXAM: CHEST  2 VIEW COMPARISON:  06/10/2016 FINDINGS: Chronic lung disease including emphysema and scattered bronchiectasis/scarring. Chronic opacity at the left base with fluid level. No progressive airspace opacity, pneumothorax, or pleural effusion. Normal heart size and mediastinal contours. Postoperative left chest. IMPRESSION: 1. Stable compared to prior. 2. Chronic lung disease including emphysema and bronchiectasis with known fluid-filled cavity at the left base. Electronically Signed   By: Monte Fantasia M.D.   On: 06/28/2016 17:05   Ct Angio Chest Pe W And/or Wo Contrast  Result Date:  06/28/2016 CLINICAL DATA:  Increasing shortness of Breath and history of mycobacterial infection pain and  previous lung carcinoma EXAM: CT ANGIOGRAPHY CHEST WITH CONTRAST TECHNIQUE: Multidetector CT imaging of the chest was performed using the standard protocol during bolus administration of intravenous contrast. Multiplanar CT image reconstructions and MIPs were obtained to evaluate the vascular anatomy. CONTRAST:  75 mL Isovue 370. COMPARISON:  05/19/2016 FINDINGS: Cardiovascular: Aortic atherosclerotic change is noted without aneurysmal dilatation or dissection. Minimal coronary calcifications are seen. The pulmonary artery shows a normal branching pattern without filling defects to suggest pulmonary embolism. Mediastinum/Nodes: Left thyroid goiter is noted at the thoracic inlet stable from the prior exam. No discrete nodule is seen. Previously seen AP window node now measures 1.9 cm in short axis increased in size from the prior exam a which time it measured 1.4 cm. No significant hilar lymphadenopathy is noted. Lungs/Pleura: Chronic emphysematous changes are again identified within both lungs. Chronic scarring in the right base is noted. A small left-sided pleural effusion is seen increased from the prior study. There remains a thickened cavitary lesion in the left lower lobe with air-fluid level within likely representing an infected bulla. Scattered smaller cavitary lesions are noted throughout the left lung with some slight increase in wall thickness when compared with the prior exam. Changes of prior left upper lobectomy are noted. Upper Abdomen: Within normal limits. Musculoskeletal: Degenerative changes of the thoracic spine are again noted. Review of the MIP images confirms the above findings. IMPRESSION: No evidence of pulmonary emboli. Stable left thyroid goiter. Slight enlargement of AP window lymph node from 1.4 to 1.9 cm. Persistent thick walled cavitary lesion in the left lower lobe. Some slight increase in left pleural effusion is noted as well as some increase in the degree of wall thickening of  smaller cavitary lesions likely representing some progressive inflammatory change. Electronically Signed   By: Inez Catalina M.D.   On: 06/28/2016 19:37    ASSESSMENT AND PLAN:   Active Problems:   COPD exacerbation (HCC)  * Ac COPD exacerbation    IV steroids, Nebs, add spiriva.    Taper oxygen.  * MAC with cavitary lung lesion   Cont Abx, add zofran.   ID consult.  * Anemia   Iron is low, replace oral.  * CAD    Aspirin .  * Hyperlipidemia   Cont lipitor.  * Htn   Chlorthalidone, losartan   All the records are reviewed and case discussed with Care Management/Social Workerr. Management plans discussed with the patient, family and they are in agreement.  CODE STATUS: Full  TOTAL TIME TAKING CARE OF THIS PATIENT: 35 minutes.     POSSIBLE D/C IN 1-2 DAYS, DEPENDING ON CLINICAL CONDITION.   [name] M.D on 06/29/2016   Between 7am to 6pm - Pager - [phone]  After 6pm go to [email or web address] - password [email or web address] [facility]  Office  [phone]  CC: Primary care physician; Asencion Noble, MD  Note: This dictation was prepared with Dragon dictation along with smaller phrase technology. Any transcriptional errors that result from this process are unintentional.

## 2016-06-29 NOTE — Care Management (Signed)
Received call from patient's wife stating that she was not aware that he was in observation status. She said that she did receive the form/[name] letter sometime yesterday.  I reviewed chart based on what I'm seeing he is appropriate for observation status. Wife states that they are financially strapped right now and in the donut hole with medications. She declined nebulizer machine due to Medicare not paying for the medications that go in the machine. Patient is not requiring O2 currently.

## 2016-06-29 NOTE — Care Management (Signed)
Discussed extensively this morning with wife about observation guidelines.  Discussed observation guidelines with husband, States he feels better about his observation status now. Shelbie Ammons RN MSN CCM Care Management [phone]

## 2016-06-29 NOTE — Consult Note (Signed)
[facility] Infectious Disease     Reason for Consult:  Fibrocavitary MAC    Referring Physician: Dolores Frame Date of Admission:  06/28/2016   Active Problems:   COPD exacerbation ([place])   HPI: [name] is a 70 y.o. male admitted with COPD exacerbation having recently started triple treatment 9/29 for MAC with Dr [name]. He has not been tolerating it well with some nausea but has been taking it.  Admitted with worsening SOB but no change in imaging and no fevers. WBC was elevated some but on chronic steroids Currently a little improvement   Past Medical History:  Diagnosis Date  . Acute MI   . Arthritis of lumbar spine ([place])   . Asthma   . Clotting disorder ([place])   . Collapsed lung    x3  . COPD (chronic obstructive pulmonary disease) ([place])   . Dyspnea   . History of benign thyroid tumor   . Lung cancer ([place])   . Mycobacterium avium complex ([place]) 06/24/2016  . Pneumonia   . Pneumothorax   . Small cell carcinoma of lung ([place])   . Spontaneous pneumothorax    Past Surgical History:  Procedure Laterality Date  . APPENDECTOMY    . benign thryoid nodule resected    . CARDIAC CATHETERIZATION  2013   [facility]  . CHEST TUBE INSERTION    . COLONOSCOPY N/A 08/30/2015   Procedure: COLONOSCOPY;  Surgeon: Steven Houston, MD;  Location: [facility];  Service: Endoscopy;  Laterality: N/A;  1:45  . left upper lobectomy for small cell lung cancer    . VATS R thoracotomy     Social History  Substance Use Topics  . Smoking status: Former Smoker    Packs/day: 3.00    Years: 30.00    Types: Cigars    Quit date: 03/02/1992  . Smokeless tobacco: Never Used     Comment: quit 22 yrs ago  . Alcohol use No   Family History  Problem Relation Age of Onset  . Heart disease Mother   . Colon cancer Father   . Heart failure Brother     Allergies:  Allergies  Allergen Reactions  . Other Other (See Comments)    Hydromet cough syrup-causes GI upset  . Codeine Nausea And Vomiting     Current antibiotics: Antibiotics Given (last 72 hours)    Date/Time Action Medication Dose   06/29/16 0945 Given   ethambutol (MYAMBUTOL) tablet 1,200 mg 1,200 mg   06/29/16 0945 Given   rifampin (RIFADIN) capsule 600 mg 600 mg   06/29/16 0945 Given   azithromycin (ZITHROMAX) tablet 500 mg 500 mg      MEDICATIONS: . aspirin EC  81 mg Oral Daily  . atorvastatin  10 mg Oral Daily  . azithromycin  500 mg Oral Daily  . chlorthalidone  25 mg Oral Daily  . enoxaparin (LOVENOX) injection  40 mg Subcutaneous QHS  . ethambutol  1,200 mg Oral Daily  . guaiFENesin  600 mg Oral Daily  . losartan  100 mg Oral Daily  . methylPREDNISolone (SOLU-MEDROL) injection  60 mg Intravenous Q6H  . mometasone-formoterol  2 puff Inhalation BID  . rifampin  600 mg Oral Daily    Review of Systems - 11 systems reviewed and negative per HPI   OBJECTIVE: Temp:  [97.9 F (36.6 C)-99 F (37.2 C)] 98.1 F (36.7 C) (10/02 0421) Pulse Rate:  [88-110] 93 (10/02 0421) Resp:  [13-23] 23 (10/02 0421) BP: (129-157)/(59-77) 129/59 (  10/02 0421) SpO2:  [96 %-100 %] 97 % (10/02 0421) Weight:  [71.7 kg (158 lb)-73.9 kg (163 lb)] 71.7 kg (158 lb) (10/01 2240) Physical Exam  Constitutional: He is oriented to person, place, and time. He appears well-developed and well-nourished. No distress.  HENT:  Mouth/Throat: Oropharynx is clear and moist. No oropharyngeal exudate.  Cardiovascular: Normal rate, regular rhythm and normal heart sounds. Exam reveals no gallop and no friction rub.  No murmur heard.  Pulmonary/Chest:poor air movement, rhonchi throughout Abdominal: Soft. Bowel sounds are normal. He exhibits no distension. There is no tenderness.  Lymphadenopathy:  He has no cervical adenopathy.  Neurological: He is alert and oriented to person, place, and time.  Skin: Skin is warm and dry. No rash noted. No erythema.  Psychiatric: He has a normal mood and affect. His behavior is normal.     LABS: Results  for orders placed or performed during the hospital encounter of 06/28/16 (from the past 48 hour(s))  CBC with Differential/Platelet     Status: Abnormal   Collection Time: 06/28/16  4:46 PM  Result Value Ref Range   WBC 13.5 (H) 3.8 - 10.6 K/uL   RBC 3.15 (L) 4.40 - 5.90 MIL/uL   Hemoglobin 9.2 (L) 13.0 - 18.0 g/dL   HCT 27.3 (L) 40.0 - 52.0 %   MCV 86.7 80.0 - 100.0 fL   MCH 29.3 26.0 - 34.0 pg   MCHC 33.8 32.0 - 36.0 g/dL   RDW 16.1 (H) 11.5 - 14.5 %   Platelets 344 150 - 440 K/uL   Neutrophils Relative % 81 %   Neutro Abs 11.1 (H) 1.4 - 6.5 K/uL   Lymphocytes Relative 6 %   Lymphs Abs 0.8 (L) 1.0 - 3.6 K/uL   Monocytes Relative 8 %   Monocytes Absolute 1.1 (H) 0.2 - 1.0 K/uL   Eosinophils Relative 4 %   Eosinophils Absolute 0.5 0 - 0.7 K/uL   Basophils Relative 1 %   Basophils Absolute 0.1 0 - 0.1 K/uL  Magnesium     Status: Abnormal   Collection Time: 06/28/16  4:46 PM  Result Value Ref Range   Magnesium 1.6 (L) 1.7 - 2.4 mg/dL  Comprehensive metabolic panel     Status: Abnormal   Collection Time: 06/28/16  4:46 PM  Result Value Ref Range   Sodium 130 (L) 135 - 145 mmol/L   Potassium 3.5 3.5 - 5.1 mmol/L   Chloride 98 (L) 101 - 111 mmol/L   CO2 23 22 - 32 mmol/L   Glucose, Bld 112 (H) 65 - 99 mg/dL   BUN 18 6 - 20 mg/dL   Creatinine, Ser 1.18 0.61 - 1.24 mg/dL   Calcium 9.4 8.9 - 10.3 mg/dL   Total Protein 6.8 6.5 - 8.1 g/dL   Albumin 3.3 (L) 3.5 - 5.0 g/dL   AST 23 15 - 41 U/L   ALT 21 17 - 63 U/L   Alkaline Phosphatase 52 38 - 126 U/L   Total Bilirubin 0.8 0.3 - 1.2 mg/dL   GFR calc non Af Amer >60 >60 mL/min   GFR calc Af Amer >60 >60 mL/min    Comment: (NOTE) The eGFR has been calculated using the CKD EPI equation. This calculation has not been validated in all clinical situations. eGFR's persistently <60 mL/min signify possible Chronic Kidney Disease.    Anion gap 9 5 - 15  Troponin I     Status: None   Collection Time: 06/28/16  4:46 PM  Result Value  Ref Range   Troponin I <0.03 <0.03 ng/mL  Phosphorus     Status: None   Collection Time: 06/28/16  4:46 PM  Result Value Ref Range   Phosphorus 3.7 2.5 - 4.6 mg/dL  Basic metabolic panel     Status: Abnormal   Collection Time: 06/29/16  4:05 AM  Result Value Ref Range   Sodium 130 (L) 135 - 145 mmol/L   Potassium 3.7 3.5 - 5.1 mmol/L   Chloride 98 (L) 101 - 111 mmol/L   CO2 23 22 - 32 mmol/L   Glucose, Bld 160 (H) 65 - 99 mg/dL   BUN 13 6 - 20 mg/dL   Creatinine, Ser 0.88 0.61 - 1.24 mg/dL   Calcium 9.2 8.9 - 10.3 mg/dL   GFR calc non Af Amer >60 >60 mL/min   GFR calc Af Amer >60 >60 mL/min    Comment: (NOTE) The eGFR has been calculated using the CKD EPI equation. This calculation has not been validated in all clinical situations. eGFR's persistently <60 mL/min signify possible Chronic Kidney Disease.    Anion gap 9 5 - 15  CBC     Status: Abnormal   Collection Time: 06/29/16  4:05 AM  Result Value Ref Range   WBC 16.9 (H) 3.8 - 10.6 K/uL   RBC 3.24 (L) 4.40 - 5.90 MIL/uL   Hemoglobin 9.2 (L) 13.0 - 18.0 g/dL   HCT 28.0 (L) 40.0 - 52.0 %   MCV 86.4 80.0 - 100.0 fL   MCH 28.3 26.0 - 34.0 pg   MCHC 32.7 32.0 - 36.0 g/dL   RDW 16.4 (H) 11.5 - 14.5 %   Platelets 349 150 - 440 K/uL  Vitamin B12     Status: Abnormal   Collection Time: 06/29/16  4:05 AM  Result Value Ref Range   Vitamin B-12 1,623 (H) 180 - 914 pg/mL    Comment: (NOTE) This assay is not validated for testing neonatal or myeloproliferative syndrome specimens for Vitamin B12 levels. Performed at [facility] [place]   Folate     Status: None   Collection Time: 06/29/16  4:05 AM  Result Value Ref Range   Folate 18.0 >5.9 ng/mL  Ferritin     Status: None   Collection Time: 06/29/16  4:05 AM  Result Value Ref Range   Ferritin 98 24 - 336 ng/mL  Iron and TIBC     Status: Abnormal   Collection Time: 06/29/16  4:05 AM  Result Value Ref Range   Iron 12 (L) 45 - 182 ug/dL   TIBC 307 250 - 450 ug/dL    Saturation Ratios 4 (L) 17.9 - 39.5 %   UIBC 295 ug/dL   No components found for: ESR, C REACTIVE PROTEIN MICRO: No results found for this or any previous visit (from the past 720 hour(s)).  IMAGING: Dg Chest 2 View  Result Date: 06/28/2016 CLINICAL DATA:  Cough EXAM: CHEST  2 VIEW COMPARISON:  06/10/2016 FINDINGS: Chronic lung disease including emphysema and scattered bronchiectasis/scarring. Chronic opacity at the left base with fluid level. No progressive airspace opacity, pneumothorax, or pleural effusion. Normal heart size and mediastinal contours. Postoperative left chest. IMPRESSION: 1. Stable compared to prior. 2. Chronic lung disease including emphysema and bronchiectasis with known fluid-filled cavity at the left base. Electronically Signed   By: Monte Fantasia M.D.   On: 06/28/2016 17:05   Dg Chest 2 View  Result Date: 06/10/2016 CLINICAL DATA:  Severe shortness of breath, cough  hypertension ex-smoker EXAM: CHEST  2 VIEW COMPARISON:  CT chest 05/19/2016, chest radiograph 04/29/2016, 12/12/2014 FINDINGS: Pulmonary inflation is similar compared to prior radiograph. Right CP angle pleural thickening is similar compared to most recent chest radiograph. Interstitial opacities at the right lung base also do not appear significantly changed. Mildly nodular left upper lobe opacities are again noted. Volume loss of the left thorax with air-fluid levels again visualized in the left lower lobe, corresponding to CT documented bulla with fluids. The larger more superior lesion appears slightly smaller. Coarse interstitial opacities in the left lung base likely corresponding to bronchiectasis and interstitial thickening do not appear significantly changed. Left hilar surgical clips again noted. No pneumothorax. Degenerative changes of the spine. No acute osseous abnormality. IMPRESSION: 1. Postsurgical changes of the left hemi thorax. Cystic lesions with air-fluid levels in the left lower lobe  corresponding to CT documented bulla are again noted; larger dominant lesion measures slightly smaller today. 2. Persistent mild nodular opacity in the left upper lobe. No significant interval change in mild right CP angle pleural thickening and interstitial opacities. 3. No new pulmonary parenchymal opacities are visualized. Electronically Signed   By: Donavan Foil M.D.   On: 06/10/2016 11:46   Ct Angio Chest Pe W And/or Wo Contrast  Result Date: 06/28/2016 CLINICAL DATA:  Increasing shortness of Breath and history of mycobacterial infection pain and previous lung carcinoma EXAM: CT ANGIOGRAPHY CHEST WITH CONTRAST TECHNIQUE: Multidetector CT imaging of the chest was performed using the standard protocol during bolus administration of intravenous contrast. Multiplanar CT image reconstructions and MIPs were obtained to evaluate the vascular anatomy. CONTRAST:  75 mL Isovue 370. COMPARISON:  05/19/2016 FINDINGS: Cardiovascular: Aortic atherosclerotic change is noted without aneurysmal dilatation or dissection. Minimal coronary calcifications are seen. The pulmonary artery shows a normal branching pattern without filling defects to suggest pulmonary embolism. Mediastinum/Nodes: Left thyroid goiter is noted at the thoracic inlet stable from the prior exam. No discrete nodule is seen. Previously seen AP window node now measures 1.9 cm in short axis increased in size from the prior exam a which time it measured 1.4 cm. No significant hilar lymphadenopathy is noted. Lungs/Pleura: Chronic emphysematous changes are again identified within both lungs. Chronic scarring in the right base is noted. A small left-sided pleural effusion is seen increased from the prior study. There remains a thickened cavitary lesion in the left lower lobe with air-fluid level within likely representing an infected bulla. Scattered smaller cavitary lesions are noted throughout the left lung with some slight increase in wall thickness when  compared with the prior exam. Changes of prior left upper lobectomy are noted. Upper Abdomen: Within normal limits. Musculoskeletal: Degenerative changes of the thoracic spine are again noted. Review of the MIP images confirms the above findings. IMPRESSION: No evidence of pulmonary emboli. Stable left thyroid goiter. Slight enlargement of AP window lymph node from 1.4 to 1.9 cm. Persistent thick walled cavitary lesion in the left lower lobe. Some slight increase in left pleural effusion is noted as well as some increase in the degree of wall thickening of smaller cavitary lesions likely representing some progressive inflammatory change. Electronically Signed   By: Inez Catalina M.D.   On: 06/28/2016 19:37    Assessment:   BRINLEY TREANOR is a 70 y.o. male with fibrocavitary MAC started on Rif, azithro and ethambutol 9/29 by Dr [name] at [facility]. Now admitted with COPD exacerbation. He has had some intolerance to the triple therapy with some nausea,  abd upset and mild diarrhea.   Recommendations I strongly encouraged him to continue the treatment given the impressive changes on CT, his 4+ MAI and his underlying lung disease and chronic steroid use.  I would start zofran to take prior to his meds to see if that will relieve his sxs. I will discuss with DR Lucianne Lei dam as pt requests to fu locally given distance. Thank you very much for allowing me to participate in the care of this patient. Please call with questions.   Cheral Marker. Ola Spurr, MD

## 2016-06-30 DIAGNOSIS — J441 Chronic obstructive pulmonary disease with (acute) exacerbation: Secondary | ICD-10-CM | POA: Diagnosis not present

## 2016-06-30 DIAGNOSIS — A310 Pulmonary mycobacterial infection: Secondary | ICD-10-CM | POA: Diagnosis not present

## 2016-06-30 DIAGNOSIS — D649 Anemia, unspecified: Secondary | ICD-10-CM | POA: Diagnosis not present

## 2016-06-30 DIAGNOSIS — E871 Hypo-osmolality and hyponatremia: Secondary | ICD-10-CM | POA: Diagnosis not present

## 2016-06-30 MED ORDER — ONDANSETRON HCL 4 MG PO TABS: 4.0000 mg | ORAL_TABLET | Freq: Four times a day (QID) | ORAL | 4 refills | Status: DC | PRN

## 2016-06-30 MED ORDER — TIOTROPIUM BROMIDE MONOHYDRATE 18 MCG IN CAPS: 18.0000 ug | ORAL_CAPSULE | Freq: Every day | RESPIRATORY_TRACT | 12 refills | Status: DC

## 2016-06-30 NOTE — Progress Notes (Signed)
Discussed discharge instructions and medications with pt and his wife. IV removed. All questions addressed. Pt transported home via car by his wife.

## 2016-07-02 ENCOUNTER — Encounter: Payer: Self-pay | Admitting: Internal Medicine

## 2016-07-02 ENCOUNTER — Ambulatory Visit (INDEPENDENT_AMBULATORY_CARE_PROVIDER_SITE_OTHER): Payer: Medicare Other | Admitting: Internal Medicine

## 2016-07-02 DIAGNOSIS — A310 Pulmonary mycobacterial infection: Secondary | ICD-10-CM | POA: Diagnosis not present

## 2016-07-02 DIAGNOSIS — I25111 Atherosclerotic heart disease of native coronary artery with angina pectoris with documented spasm: Secondary | ICD-10-CM

## 2016-07-02 DIAGNOSIS — J470 Bronchiectasis with acute lower respiratory infection: Secondary | ICD-10-CM | POA: Diagnosis not present

## 2016-07-02 NOTE — Progress Notes (Signed)
Patient ID: [name], male    DOB: 1945/10/31, 71 y.o.   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 Denies acute exacerbation. Tolerated colonoscopy well. Continues maintenance prednisone 10 mg daily.  03/02/2016-70 year old male former smoker followed for COPD, history small cell cancer, history pneumothorax, history DVT/PE/off warfarin  FOLLOWS FOR: Pt states his breathing is off at times-get SOB and feels that his rescue inhaler is not working as well as it should. Chest cold ran on about a month and is finally almost gone. Still coughing a little yellow. He doesn't think his pro-air rescue inhaler working very effectively. Continues prednisone 10 mg twice daily maintenance.  07/02/2016-70 year old male former smoker followed for COPD, history small cell cancer, history pneumothorax, history DVT/PE/off warfarin, fibrocavitary MAC                      Wife here Sputum culture had returned 4+ positive AFB/ MAC. Seen by ID/ Dr Lucianne Lei Dam> azith/rif/emb started 06/24/16 Acute Hospital f/u- [name] 10/1-10/3- was seen by ID reporting nausea with his triple therapy. No change in imaging, no fever. Elevated WBC on chronic steroids. Switching to the ID doctor who saw him at [facility]- much closer to his home. I invited him to switch to Pulmonary in [place] also if he chooses. Finances hard. Has Advair now, and samples Breo 100. No fever/ sweats. Cough with scant phlegm. Nausea on triple therapy-Zofran. We reviewed his CT chest images.  ROS-see HPI   Negative unless "+" Constitutional:    weight loss, night sweats, fevers, chills, fatigue,  lassitude. HEENT:    headaches, difficulty swallowing, tooth/dental problems, sore throat,       sneezing, itching, ear ache, +nasal congestion, post nasal drip, snoring CV:    chest pain, orthopnea, PND, +swelling in lower extremities, anasarca,                                                     dizziness, palpitations Resp:   +shortness of breath with exertion or at rest.                + productive cough,   non-productive cough, coughing up of blood.              + change in color of mucus.  wheezing.   Skin:    rash or lesions. GI:  No-   heartburn, indigestion, abdominal pain, nausea, vomiting,  GU: . MS:   joint pain, stiffness,  Neuro-     nothing unusual Psych:  change in mood or affect.  depression or anxiety.   memory loss.  OBJ- Physical Exam General- Alert, Oriented, Affect-appropriate, Distress- none acute. +Weak- wheelchair Skin- rash-none, lesions- none, excoriation- none Lymphadenopathy- none Head- atraumatic            Eyes- Gross vision intact, PERRLA, conjunctivae and secretions clear            Ears- Hearing, canals-normal  Nose- Clear, no-Septal dev, mucus, polyps, erosion, perforation             Throat- Mallampati II , mucosa clear , drainage- none, tonsils- atrophic Neck- flexible , trachea midline, no stridor , thyroid nl, carotid no bruit Chest - symmetrical excursion , unlabored           Heart/CV- RRR , no murmur , no gallop  , no rub, nl s1 s2                           - JVD- none , edema-none stasis changes- none, varices+           Lung- + few crackles right base again noted, wheeze + slight, cough-none, dullness-none, rub- none           Chest wall-  Abd-  Br/ Gen/ Rectal- Not done, not indicated Extrem- cyanosis- none, clubbing, none, atrophy- none, strength- nl, + superficial varices Neuro- grossly intact to observation

## 2016-07-02 NOTE — Patient Instructions (Addendum)
Sample Spiriva Respimat     Try inhaling 2 puffs, once daily     See if it helps shortness of breath  Ok to use up Advair   1 puff then rinse mouth, twice daily  When you run out of Advair, then use your samples of Breo 100   Inhale 1 puff once daily and rinse mouth  You will be seeing the Infectious Disease doctor in [facility]  Keep appointment as scheduled

## 2016-07-02 NOTE — Assessment & Plan Note (Signed)
CT chest shows bilateral blebs, severe fibrobullous inflammation and pleural effusion in L base. This will clear only slowly at best.Only the [facility] was identified on culture, but multiple organisms possible. L effusion too small and complicated to tap.

## 2016-07-02 NOTE — Assessment & Plan Note (Signed)
Ok to be followed by ID in [place]. He can discuss nausea on meds.

## 2016-07-05 NOTE — Discharge Summary (Signed)
[facility] [place] NAME: [name]    MR#:  [id number]  DATE OF BIRTH:  [birth year]  DATE OF ADMISSION:  06/28/2016 ADMITTING PHYSICIAN: Harvie Bridge, DO  DATE OF DISCHARGE: 06/30/2016  1:00 PM  PRIMARY CARE PHYSICIAN: Asencion Noble, MD    ADMISSION DIAGNOSIS:  SOB (shortness of breath) [R06.02] Centrilobular emphysema (HCC) [J43.2]  DISCHARGE DIAGNOSIS:  Active Problems:   COPD exacerbation (HCC)  MAC - cavitary lung lesion/  SECONDARY DIAGNOSIS:   Past Medical History:  Diagnosis Date  . Acute MI   . Arthritis of lumbar spine ([place])   . Asthma   . Clotting disorder ([place])   . Collapsed lung    x3  . COPD (chronic obstructive pulmonary disease) ([place])   . Dyspnea   . History of benign thyroid tumor   . Lung cancer ([place])   . Mycobacterium avium complex ([place]) 06/24/2016  . Pneumonia   . Pneumothorax   . Small cell carcinoma of lung ([place])   . Spontaneous pneumothorax     HOSPITAL COURSE:   * Ac COPD exacerbation    IV steroids, Nebs, add spiriva.    Taper oxygen.  * MAC with cavitary lung lesion   Cont Abx, add zofran.   ID consult.  suggested to cont same therapy.   Follow in office.  * Anemia   Iron is low, replace oral.  * CAD    Aspirin .  * Hyperlipidemia   Cont lipitor.  * Htn   Chlorthalidone, losartan  DISCHARGE CONDITIONS:   Stable.  CONSULTS OBTAINED:  Treatment Team:  Leonel Ramsay, MD  DRUG ALLERGIES:   Allergies  Allergen Reactions  . Other Other (See Comments)    Hydromet cough syrup-causes GI upset  . Codeine Nausea And Vomiting    DISCHARGE MEDICATIONS:   Discharge Medication List as of 06/30/2016 12:21 PM    START taking these medications   Details  ondansetron (ZOFRAN) 4 MG tablet Take 1 tablet (4 mg total) by mouth every 6 (six) hours as needed for nausea., Starting Tue 06/30/2016, Print    tiotropium (SPIRIVA) 18 MCG inhalation capsule Place 1 capsule  (18 mcg total) into inhaler and inhale daily., Starting Tue 06/30/2016, Print      CONTINUE these medications which have NOT CHANGED   Details  acetaminophen (TYLENOL) 325 MG tablet Take 650 mg by mouth as needed.  , Until Discontinued, Historical Med    albuterol (PROVENTIL HFA;VENTOLIN HFA) 108 (90 Base) MCG/ACT inhaler Inhale 2 puffs into the lungs every 6 (six) hours as needed for wheezing or shortness of breath., Starting Mon 03/02/2016, Print    aspirin 81 MG EC tablet Take 81 mg by mouth daily. Swallow whole., Until Discontinued, Historical Med    atorvastatin (LIPITOR) 10 MG tablet TAKE 1 TABLET BY MOUTH EVERY DAY, Normal    azithromycin (ZITHROMAX) 500 MG tablet Take 1 tablet (500 mg total) by mouth daily., Starting Wed 06/24/2016, Normal    chlorthalidone (HYGROTON) 25 MG tablet Take 25 mg by mouth daily., Starting 02/01/2015, Until Discontinued, Historical Med    ethambutol (MYAMBUTOL) 400 MG tablet Take 3 tablets (1,200 mg total) by mouth daily., Starting Wed 06/24/2016, Normal    Fluticasone-Salmeterol (ADVAIR DISKUS) 100-50 MCG/DOSE AEPB Inhale 1 puff into the lungs 2 (two) times daily., Starting Mon 03/30/2016, Normal    guaiFENesin (MUCINEX) 600 MG 12 hr tablet Take 600 mg by mouth daily. , Until Discontinued, Historical Med  losartan (COZAAR) 100 MG tablet Take 100 mg by mouth daily., Starting 11/01/2014, Until Discontinued, Historical Med    rifampin (RIFADIN) 300 MG capsule Take 2 capsules (600 mg total) by mouth daily., Starting Wed 06/24/2016, Normal    traMADol (ULTRAM) 50 MG tablet Takes 2 tablets 4 times daily as needed for pain., Starting 05/25/2011, Until Discontinued, Historical Med         DISCHARGE INSTRUCTIONS:    Follow with pulm and ID clinic.  If you experience worsening of your admission symptoms, develop shortness of breath, life threatening emergency, suicidal or homicidal thoughts you must seek medical attention immediately by calling 911 or calling  your MD immediately  if symptoms less severe.  You Must read complete instructions/literature along with all the possible adverse reactions/side effects for all the Medicines you take and that have been prescribed to you. Take any new Medicines after you have completely understood and accept all the possible adverse reactions/side effects.   Please note  You were cared for by a hospitalist during your hospital stay. If you have any questions about your discharge medications or the care you received while you were in the hospital after you are discharged, you can call the unit and asked to speak with the hospitalist on call if the hospitalist that took care of you is not available. Once you are discharged, your primary care physician will handle any further medical issues. Please note that NO REFILLS for any discharge medications will be authorized once you are discharged, as it is imperative that you return to your primary care physician (or establish a relationship with a primary care physician if you do not have one) for your aftercare needs so that they can reassess your need for medications and monitor your lab values.    Today   CHIEF COMPLAINT:   Chief Complaint  Patient presents with  . Shortness of Breath    HISTORY OF PRESENT ILLNESS:  [name]  is a 70 y.o. male with a known history of COPD, currently being treated for MAC, History of PE and DVT presents to the emergency department complaining of shortness of breath.  Patient is short of breath at baseline, but experienced worsening shortness of breath in the past 24 hours without relief from his home regimen of nebulizers.    Otherwise there has been no change in status. Patient has been taking medication as prescribed and there has been no recent change in medication or diet.  There has been no recent illness, travel or sick contacts.    Patient denies fevers/chills, weakness, dizziness, chest pain, shortness of breath,  N/V/C/D, abdominal pain, dysuria/frequency, changes in mental status.    VITAL SIGNS:  Blood pressure (!) 160/83, pulse 95, temperature 98.1 F (36.7 C), temperature source Oral, resp. rate 20, height '5\' 7"'$  (1.702 m), weight 71.7 kg (158 lb), SpO2 99 %.  I/O:  No intake or output data in the 24 hours ending 07/05/16 1524  PHYSICAL EXAMINATION:   GENERAL:  70 y.o.-year-old patient lying in the bed with no acute distress.  EYES: Pupils equal, round, reactive to light and accommodation. No scleral icterus. Extraocular muscles intact.  HEENT: Head atraumatic, normocephalic. Oropharynx and nasopharynx clear.  NECK:  Supple, no jugular venous distention. No thyroid enlargement, no tenderness.  LUNGS: Normal breath sounds bilaterally, minimal wheezing,no crepitation. No use of accessory muscles of respiration.  CARDIOVASCULAR: S1, S2 normal. No murmurs, rubs, or gallops.  ABDOMEN: Soft, nontender, nondistended. Bowel sounds present. No organomegaly  or mass.  EXTREMITIES: No pedal edema, cyanosis, or clubbing.  NEUROLOGIC: Cranial nerves II through XII are intact. Muscle strength 5/5 in all extremities. Sensation intact. Gait not checked.  PSYCHIATRIC: The patient is alert and oriented x 3.  SKIN: No obvious rash, lesion, or ulcer.    DATA REVIEW:   CBC  Recent Labs Lab 06/29/16 0405  WBC 16.9*  HGB 9.2*  HCT 28.0*  PLT 349    Chemistries   Recent Labs Lab 06/28/16 1646 06/29/16 0405  NA 130* 130*  K 3.5 3.7  CL 98* 98*  CO2 23 23  GLUCOSE 112* 160*  BUN 18 13  CREATININE 1.18 0.88  CALCIUM 9.4 9.2  MG 1.6*  --   AST 23  --   ALT 21  --   ALKPHOS 52  --   BILITOT 0.8  --     Cardiac Enzymes  Recent Labs Lab 06/28/16 1646  TROPONINI <0.03    Microbiology Results  Results for orders placed or performed in visit on 05/20/16  Respiratory or Resp and Sputum Culture     Status: None   Collection Time: 05/20/16  1:46 PM  Result Value Ref Range Status   Gram  Stain Abundant  Final   Gram Stain WBC present-predominately PMN  Final   Gram Stain Few Squamous Epithelial Cells Present  Final   Gram Stain   Final    Few Gram Positive Cocci In Pairs This specimen is acceptable for Sputum Culture   Organism ID, Bacteria Normal Oropharyngeal Flora  Final  AFB culture with smear (NOT at [facility])     Status: Abnormal   Collection Time: 05/20/16  1:46 PM  Result Value Ref Range Status   Source: SPUTUM  Final   Acid Fast Bacilli (AFB) Culture   (A)  Final    Comment:   MYCOBACTERIA, CULTURE, WITH FLUOROCHROME SMEAR       MICRO NUMBER:      [id number]   TEST STATUS:       FINAL   SPECIMEN SOURCE:   SPUTUM   SPECIMEN QUALITY:  ADEQUATE   SMEAR:             Many (4 +) acid-fast bacilli seen using the                      fluorochrome method.   RESULT:            DNA probe result positive for                      Mycobacterium avium-intracellulare complex   Fungus Culture with Smear     Status: None   Collection Time: 05/20/16  1:46 PM  Result Value Ref Range Status   Source: Not Provided  Final   Fungus (Mycology) Culture    Final    Comment:   CULTURE, FUNGUS W/SMEAR NOT HAIR, SKIN, BLOOD       MICRO NUMBER:      [id number]   TEST STATUS:       FINAL   SPECIMEN SOURCE:   SPUTUM   SPECIMEN QUALITY:  ADEQUATE   SMEAR:             No fungal elements seen.   RESULT:            No fungus isolated     RADIOLOGY:  No results found.  EKG:   Orders placed or performed during the hospital  encounter of 06/28/16  . EKG 12-Lead  . EKG 12-Lead  . ED EKG  . ED EKG  . EKG      Management plans discussed with the patient, family and they are in agreement.  CODE STATUS:  Code Status History    Date Active Date Inactive Code Status Order ID Comments User Context   06/28/2016 10:37 PM 06/30/2016  4:27 PM Full Code [id number]  Harvie Bridge, DO Inpatient      TOTAL TIME TAKING CARE OF THIS PATIENT: 35 minutes.    [name] M.D on  07/05/2016 at 3:24 PM  Between 7am to 6pm - Pager - [phone]  After 6pm go to [email or web address] - password [email or web address] [facility]  Office  [phone]  CC: Primary care physician; Asencion Noble, MD   Note: This dictation was prepared with Dragon dictation along with smaller phrase technology. Any transcriptional errors that result from this process are unintentional.

## 2016-07-07 ENCOUNTER — Other Ambulatory Visit
Admission: RE | Admit: 2016-07-07 | Discharge: 2016-07-07 | Disposition: A | Discharge status: Home / Self Care | Payer: Medicare Other | Source: Ambulatory Visit | Attending: Infectious Diseases | Admitting: Infectious Diseases

## 2016-07-07 ENCOUNTER — Telehealth: Payer: Self-pay | Admitting: Cardiovascular Disease

## 2016-07-07 DIAGNOSIS — A310 Pulmonary mycobacterial infection: Secondary | ICD-10-CM | POA: Diagnosis not present

## 2016-07-07 DIAGNOSIS — J439 Emphysema, unspecified: Secondary | ICD-10-CM | POA: Diagnosis not present

## 2016-07-07 DIAGNOSIS — J479 Bronchiectasis, uncomplicated: Secondary | ICD-10-CM | POA: Diagnosis not present

## 2016-07-07 DIAGNOSIS — I272 Pulmonary hypertension, unspecified: Secondary | ICD-10-CM

## 2016-07-07 DIAGNOSIS — J441 Chronic obstructive pulmonary disease with (acute) exacerbation: Secondary | ICD-10-CM | POA: Insufficient documentation

## 2016-07-07 DIAGNOSIS — R0602 Shortness of breath: Secondary | ICD-10-CM

## 2016-07-07 LAB — BRAIN NATRIURETIC PEPTIDE: B NATRIURETIC PEPTIDE 5: 40 pg/mL (ref 0.0–100.0)

## 2016-07-07 NOTE — Telephone Encounter (Signed)
Okay to order echo if he would like Shortness of breath, pulmonary hypertension, coronary disease, leg edema

## 2016-07-07 NOTE — Telephone Encounter (Signed)
Pt wife calling stating pt was in hospital and was seen in there for an infection. He is having some swelling in the legs too. Was given medicine for that Pt saw Dr Ola Spurr today and he recommended that patient may need a repeat echo Please advise.

## 2016-07-07 NOTE — Telephone Encounter (Signed)
Dr. Ola Spurr suggested pt have a repeat ECHO, but did not place order. Is it ok for [place] to schedule?

## 2016-07-08 NOTE — Telephone Encounter (Signed)
Spoke with patients wife and let her know that order has been placed for echocardiogram and ready for her to schedule. She verbalized understanding and was appreciative for the call back. Transferred her to scheduling to make appointment for her husband to have the echocardiogram. Let her know to call back if we can assist in anyway.

## 2016-07-13 ENCOUNTER — Ambulatory Visit: Payer: Medicare Other

## 2016-07-20 ENCOUNTER — Inpatient Hospital Stay
Admission: EM | Admit: 2016-07-20 | Discharge: 2016-07-29 | DRG: 189 | Disposition: A | Discharge status: Skilled Nursing Facility | Payer: Medicare Other | Attending: Internal Medicine | Admitting: Internal Medicine

## 2016-07-20 ENCOUNTER — Emergency Department: Payer: Medicare Other

## 2016-07-20 ENCOUNTER — Inpatient Hospital Stay (HOSPITAL_COMMUNITY)
Admit: 2016-07-20 | Discharge: 2016-07-20 | Disposition: A | Discharge status: Home / Self Care | Payer: Medicare Other | Attending: Specialist | Admitting: Specialist

## 2016-07-20 ENCOUNTER — Encounter: Payer: Self-pay | Admitting: *Deleted

## 2016-07-20 DIAGNOSIS — J690 Pneumonitis due to inhalation of food and vomit: Secondary | ICD-10-CM | POA: Diagnosis not present

## 2016-07-20 DIAGNOSIS — M6281 Muscle weakness (generalized): Secondary | ICD-10-CM | POA: Diagnosis not present

## 2016-07-20 DIAGNOSIS — G9340 Encephalopathy, unspecified: Secondary | ICD-10-CM | POA: Diagnosis present

## 2016-07-20 DIAGNOSIS — E861 Hypovolemia: Secondary | ICD-10-CM | POA: Diagnosis present

## 2016-07-20 DIAGNOSIS — Z4682 Encounter for fitting and adjustment of non-vascular catheter: Secondary | ICD-10-CM | POA: Diagnosis not present

## 2016-07-20 DIAGNOSIS — C3492 Malignant neoplasm of unspecified part of left bronchus or lung: Secondary | ICD-10-CM | POA: Diagnosis present

## 2016-07-20 DIAGNOSIS — E43 Unspecified severe protein-calorie malnutrition: Secondary | ICD-10-CM | POA: Diagnosis present

## 2016-07-20 DIAGNOSIS — J9600 Acute respiratory failure, unspecified whether with hypoxia or hypercapnia: Secondary | ICD-10-CM | POA: Diagnosis not present

## 2016-07-20 DIAGNOSIS — J189 Pneumonia, unspecified organism: Secondary | ICD-10-CM

## 2016-07-20 DIAGNOSIS — R531 Weakness: Secondary | ICD-10-CM | POA: Diagnosis not present

## 2016-07-20 DIAGNOSIS — M7989 Other specified soft tissue disorders: Secondary | ICD-10-CM | POA: Diagnosis not present

## 2016-07-20 DIAGNOSIS — R0789 Other chest pain: Secondary | ICD-10-CM | POA: Diagnosis not present

## 2016-07-20 DIAGNOSIS — E876 Hypokalemia: Secondary | ICD-10-CM | POA: Diagnosis not present

## 2016-07-20 DIAGNOSIS — R609 Edema, unspecified: Secondary | ICD-10-CM | POA: Diagnosis not present

## 2016-07-20 DIAGNOSIS — E872 Acidosis: Secondary | ICD-10-CM | POA: Diagnosis present

## 2016-07-20 DIAGNOSIS — E860 Dehydration: Secondary | ICD-10-CM | POA: Diagnosis present

## 2016-07-20 DIAGNOSIS — D62 Acute posthemorrhagic anemia: Secondary | ICD-10-CM | POA: Diagnosis not present

## 2016-07-20 DIAGNOSIS — Z7189 Other specified counseling: Secondary | ICD-10-CM

## 2016-07-20 DIAGNOSIS — K567 Ileus, unspecified: Secondary | ICD-10-CM | POA: Diagnosis not present

## 2016-07-20 DIAGNOSIS — J440 Chronic obstructive pulmonary disease with acute lower respiratory infection: Secondary | ICD-10-CM | POA: Diagnosis present

## 2016-07-20 DIAGNOSIS — R1312 Dysphagia, oropharyngeal phase: Secondary | ICD-10-CM | POA: Diagnosis not present

## 2016-07-20 DIAGNOSIS — J449 Chronic obstructive pulmonary disease, unspecified: Secondary | ICD-10-CM | POA: Diagnosis not present

## 2016-07-20 DIAGNOSIS — K922 Gastrointestinal hemorrhage, unspecified: Secondary | ICD-10-CM | POA: Diagnosis not present

## 2016-07-20 DIAGNOSIS — J441 Chronic obstructive pulmonary disease with (acute) exacerbation: Secondary | ICD-10-CM | POA: Diagnosis not present

## 2016-07-20 DIAGNOSIS — Z515 Encounter for palliative care: Secondary | ICD-10-CM | POA: Diagnosis not present

## 2016-07-20 DIAGNOSIS — Z7951 Long term (current) use of inhaled steroids: Secondary | ICD-10-CM | POA: Diagnosis not present

## 2016-07-20 DIAGNOSIS — J9621 Acute and chronic respiratory failure with hypoxia: Principal | ICD-10-CM | POA: Diagnosis present

## 2016-07-20 DIAGNOSIS — E871 Hypo-osmolality and hyponatremia: Secondary | ICD-10-CM

## 2016-07-20 DIAGNOSIS — J181 Lobar pneumonia, unspecified organism: Secondary | ICD-10-CM | POA: Diagnosis not present

## 2016-07-20 DIAGNOSIS — Z7982 Long term (current) use of aspirin: Secondary | ICD-10-CM | POA: Diagnosis not present

## 2016-07-20 DIAGNOSIS — Z66 Do not resuscitate: Secondary | ICD-10-CM | POA: Diagnosis not present

## 2016-07-20 DIAGNOSIS — M4696 Unspecified inflammatory spondylopathy, lumbar region: Secondary | ICD-10-CM | POA: Diagnosis present

## 2016-07-20 DIAGNOSIS — A310 Pulmonary mycobacterial infection: Secondary | ICD-10-CM | POA: Diagnosis present

## 2016-07-20 DIAGNOSIS — Z79899 Other long term (current) drug therapy: Secondary | ICD-10-CM

## 2016-07-20 DIAGNOSIS — R140 Abdominal distension (gaseous): Secondary | ICD-10-CM | POA: Diagnosis not present

## 2016-07-20 DIAGNOSIS — R05 Cough: Secondary | ICD-10-CM | POA: Diagnosis not present

## 2016-07-20 DIAGNOSIS — R0602 Shortness of breath: Secondary | ICD-10-CM | POA: Diagnosis not present

## 2016-07-20 DIAGNOSIS — D509 Iron deficiency anemia, unspecified: Secondary | ICD-10-CM | POA: Diagnosis present

## 2016-07-20 DIAGNOSIS — I110 Hypertensive heart disease with heart failure: Secondary | ICD-10-CM | POA: Diagnosis present

## 2016-07-20 DIAGNOSIS — D638 Anemia in other chronic diseases classified elsewhere: Secondary | ICD-10-CM | POA: Diagnosis present

## 2016-07-20 DIAGNOSIS — L03116 Cellulitis of left lower limb: Secondary | ICD-10-CM | POA: Diagnosis not present

## 2016-07-20 DIAGNOSIS — J158 Pneumonia due to other specified bacteria: Secondary | ICD-10-CM | POA: Diagnosis present

## 2016-07-20 DIAGNOSIS — R627 Adult failure to thrive: Secondary | ICD-10-CM | POA: Diagnosis present

## 2016-07-20 DIAGNOSIS — I252 Old myocardial infarction: Secondary | ICD-10-CM | POA: Diagnosis not present

## 2016-07-20 DIAGNOSIS — Z4659 Encounter for fitting and adjustment of other gastrointestinal appliance and device: Secondary | ICD-10-CM

## 2016-07-20 DIAGNOSIS — J9383 Other pneumothorax: Secondary | ICD-10-CM | POA: Diagnosis not present

## 2016-07-20 DIAGNOSIS — I5033 Acute on chronic diastolic (congestive) heart failure: Secondary | ICD-10-CM | POA: Diagnosis present

## 2016-07-20 DIAGNOSIS — R410 Disorientation, unspecified: Secondary | ICD-10-CM | POA: Diagnosis not present

## 2016-07-20 DIAGNOSIS — D508 Other iron deficiency anemias: Secondary | ICD-10-CM | POA: Diagnosis not present

## 2016-07-20 DIAGNOSIS — R4182 Altered mental status, unspecified: Secondary | ICD-10-CM | POA: Diagnosis not present

## 2016-07-20 DIAGNOSIS — R6889 Other general symptoms and signs: Secondary | ICD-10-CM | POA: Diagnosis not present

## 2016-07-20 DIAGNOSIS — J9690 Respiratory failure, unspecified, unspecified whether with hypoxia or hypercapnia: Secondary | ICD-10-CM

## 2016-07-20 DIAGNOSIS — Z87891 Personal history of nicotine dependence: Secondary | ICD-10-CM

## 2016-07-20 DIAGNOSIS — N179 Acute kidney failure, unspecified: Secondary | ICD-10-CM | POA: Diagnosis present

## 2016-07-20 DIAGNOSIS — K5730 Diverticulosis of large intestine without perforation or abscess without bleeding: Secondary | ICD-10-CM | POA: Diagnosis not present

## 2016-07-20 DIAGNOSIS — I872 Venous insufficiency (chronic) (peripheral): Secondary | ICD-10-CM | POA: Diagnosis present

## 2016-07-20 DIAGNOSIS — Z86718 Personal history of other venous thrombosis and embolism: Secondary | ICD-10-CM

## 2016-07-20 DIAGNOSIS — J9601 Acute respiratory failure with hypoxia: Secondary | ICD-10-CM | POA: Diagnosis not present

## 2016-07-20 DIAGNOSIS — D649 Anemia, unspecified: Secondary | ICD-10-CM | POA: Diagnosis not present

## 2016-07-20 DIAGNOSIS — I509 Heart failure, unspecified: Secondary | ICD-10-CM

## 2016-07-20 DIAGNOSIS — R059 Cough, unspecified: Secondary | ICD-10-CM

## 2016-07-20 DIAGNOSIS — R1110 Vomiting, unspecified: Secondary | ICD-10-CM

## 2016-07-20 DIAGNOSIS — R41841 Cognitive communication deficit: Secondary | ICD-10-CM | POA: Diagnosis not present

## 2016-07-20 DIAGNOSIS — E785 Hyperlipidemia, unspecified: Secondary | ICD-10-CM | POA: Diagnosis not present

## 2016-07-20 DIAGNOSIS — R262 Difficulty in walking, not elsewhere classified: Secondary | ICD-10-CM | POA: Diagnosis not present

## 2016-07-20 DIAGNOSIS — D500 Iron deficiency anemia secondary to blood loss (chronic): Secondary | ICD-10-CM | POA: Diagnosis not present

## 2016-07-20 DIAGNOSIS — R600 Localized edema: Secondary | ICD-10-CM | POA: Diagnosis not present

## 2016-07-20 DIAGNOSIS — Z0189 Encounter for other specified special examinations: Secondary | ICD-10-CM

## 2016-07-20 DIAGNOSIS — Z86711 Personal history of pulmonary embolism: Secondary | ICD-10-CM

## 2016-07-20 DIAGNOSIS — I272 Pulmonary hypertension, unspecified: Secondary | ICD-10-CM | POA: Diagnosis present

## 2016-07-20 DIAGNOSIS — L03115 Cellulitis of right lower limb: Secondary | ICD-10-CM | POA: Diagnosis not present

## 2016-07-20 DIAGNOSIS — J9620 Acute and chronic respiratory failure, unspecified whether with hypoxia or hypercapnia: Secondary | ICD-10-CM | POA: Diagnosis not present

## 2016-07-20 DIAGNOSIS — I2510 Atherosclerotic heart disease of native coronary artery without angina pectoris: Secondary | ICD-10-CM | POA: Diagnosis present

## 2016-07-20 DIAGNOSIS — Z7401 Bed confinement status: Secondary | ICD-10-CM | POA: Diagnosis not present

## 2016-07-20 DIAGNOSIS — Z8701 Personal history of pneumonia (recurrent): Secondary | ICD-10-CM | POA: Diagnosis not present

## 2016-07-20 DIAGNOSIS — I5032 Chronic diastolic (congestive) heart failure: Secondary | ICD-10-CM | POA: Diagnosis not present

## 2016-07-20 DIAGNOSIS — K3189 Other diseases of stomach and duodenum: Secondary | ICD-10-CM

## 2016-07-20 DIAGNOSIS — H6691 Otitis media, unspecified, right ear: Secondary | ICD-10-CM | POA: Diagnosis not present

## 2016-07-20 DIAGNOSIS — Z6823 Body mass index (BMI) 23.0-23.9, adult: Secondary | ICD-10-CM

## 2016-07-20 DIAGNOSIS — R110 Nausea: Secondary | ICD-10-CM

## 2016-07-20 LAB — GASTROINTESTINAL PANEL BY PCR, STOOL (REPLACES STOOL CULTURE)
ADENOVIRUS F40/41: NOT DETECTED
ASTROVIRUS: NOT DETECTED
Campylobacter species: NOT DETECTED
Cryptosporidium: NOT DETECTED
Cyclospora cayetanensis: NOT DETECTED
ENTAMOEBA HISTOLYTICA: NOT DETECTED
ENTEROAGGREGATIVE E COLI (EAEC): NOT DETECTED
ENTEROTOXIGENIC E COLI (ETEC): NOT DETECTED
Enteropathogenic E coli (EPEC): NOT DETECTED
GIARDIA LAMBLIA: NOT DETECTED
NOROVIRUS GI/GII: NOT DETECTED
Plesimonas shigelloides: NOT DETECTED
Rotavirus A: NOT DETECTED
SALMONELLA SPECIES: NOT DETECTED
Sapovirus (I, II, IV, and V): NOT DETECTED
Shiga like toxin producing E coli (STEC): NOT DETECTED
Shigella/Enteroinvasive E coli (EIEC): NOT DETECTED
VIBRIO CHOLERAE: NOT DETECTED
Vibrio species: NOT DETECTED
Yersinia enterocolitica: NOT DETECTED

## 2016-07-20 LAB — CBC
HEMATOCRIT: 26.1 % — AB (ref 40.0–52.0)
Hemoglobin: 8.8 g/dL — ABNORMAL LOW (ref 13.0–18.0)
MCH: 28.6 pg (ref 26.0–34.0)
MCHC: 33.9 g/dL (ref 32.0–36.0)
MCV: 84.4 fL (ref 80.0–100.0)
Platelets: 462 10*3/uL — ABNORMAL HIGH (ref 150–440)
RBC: 3.09 MIL/uL — AB (ref 4.40–5.90)
RDW: 15.9 % — ABNORMAL HIGH (ref 11.5–14.5)
WBC: 17.6 10*3/uL — AB (ref 3.8–10.6)

## 2016-07-20 LAB — TROPONIN I

## 2016-07-20 LAB — C DIFFICILE QUICK SCREEN W PCR REFLEX
C DIFFICLE (CDIFF) ANTIGEN: NEGATIVE
C Diff interpretation: NOT DETECTED
C Diff toxin: NEGATIVE

## 2016-07-20 LAB — COMPREHENSIVE METABOLIC PANEL
ALT: 20 U/L (ref 17–63)
AST: 28 U/L (ref 15–41)
Albumin: 3 g/dL — ABNORMAL LOW (ref 3.5–5.0)
Alkaline Phosphatase: 48 U/L (ref 38–126)
Anion gap: 13 (ref 5–15)
BILIRUBIN TOTAL: 0.5 mg/dL (ref 0.3–1.2)
BUN: 15 mg/dL (ref 6–20)
CALCIUM: 8.4 mg/dL — AB (ref 8.9–10.3)
CO2: 20 mmol/L — ABNORMAL LOW (ref 22–32)
CREATININE: 1.59 mg/dL — AB (ref 0.61–1.24)
Chloride: 91 mmol/L — ABNORMAL LOW (ref 101–111)
GFR calc Af Amer: 49 mL/min — ABNORMAL LOW (ref >60)
GFR, EST NON AFRICAN AMERICAN: 43 mL/min — AB (ref >60)
Glucose, Bld: 127 mg/dL — ABNORMAL HIGH (ref 65–99)
Potassium: 3.5 mmol/L (ref 3.5–5.1)
Sodium: 124 mmol/L — ABNORMAL LOW (ref 135–145)
TOTAL PROTEIN: 6.5 g/dL (ref 6.5–8.1)

## 2016-07-20 LAB — BRAIN NATRIURETIC PEPTIDE: B Natriuretic Peptide: 42 pg/mL (ref 0.0–100.0)

## 2016-07-20 MED ORDER — HEPARIN SODIUM (PORCINE) 5000 UNIT/ML IJ SOLN
5000.0000 [IU] | Freq: Three times a day (TID) | INTRAMUSCULAR | Status: DC
Start: 2016-07-20 — End: 2016-07-21
  Administered 2016-07-20 – 2016-07-21 (×2): 5000 [IU] via SUBCUTANEOUS
  Filled 2016-07-20 (×2): qty 1

## 2016-07-20 MED ORDER — ACETAMINOPHEN 650 MG RE SUPP: 650.0000 mg | Freq: Four times a day (QID) | RECTAL | Status: DC | PRN

## 2016-07-20 MED ORDER — SODIUM CHLORIDE 0.9 % IV BOLUS (SEPSIS)
1000.0000 mL | Freq: Once | INTRAVENOUS | Status: AC
  Administered 2016-07-20: 1000 mL via INTRAVENOUS

## 2016-07-20 MED ORDER — ETHAMBUTOL HCL 400 MG PO TABS
1200.0000 mg | ORAL_TABLET | Freq: Every day | ORAL | Status: DC
  Administered 2016-07-21: 1200 mg via ORAL
  Filled 2016-07-20 (×3): qty 3

## 2016-07-20 MED ORDER — ALBUTEROL SULFATE (2.5 MG/3ML) 0.083% IN NEBU
2.5000 mg | INHALATION_SOLUTION | Freq: Four times a day (QID) | RESPIRATORY_TRACT | Status: DC | PRN
  Administered 2016-07-22: 2.5 mg via RESPIRATORY_TRACT
  Filled 2016-07-20: qty 3

## 2016-07-20 MED ORDER — ONDANSETRON HCL 4 MG PO TABS: 4.0000 mg | ORAL_TABLET | Freq: Four times a day (QID) | ORAL | Status: DC | PRN

## 2016-07-20 MED ORDER — ONDANSETRON HCL 4 MG/2ML IJ SOLN
4.0000 mg | Freq: Four times a day (QID) | INTRAMUSCULAR | Status: DC | PRN
  Administered 2016-07-25 – 2016-07-27 (×3): 4 mg via INTRAVENOUS
  Filled 2016-07-20 (×3): qty 2

## 2016-07-20 MED ORDER — SODIUM CHLORIDE 0.9 % IV SOLN
Freq: Once | INTRAVENOUS | Status: AC
  Administered 2016-07-20: 1000 mL via INTRAVENOUS

## 2016-07-20 MED ORDER — AZITHROMYCIN 250 MG PO TABS
500.0000 mg | ORAL_TABLET | Freq: Every day | ORAL | Status: DC
  Administered 2016-07-20: 500 mg via ORAL
  Filled 2016-07-20: qty 2

## 2016-07-20 MED ORDER — MOMETASONE FURO-FORMOTEROL FUM 100-5 MCG/ACT IN AERO
2.0000 | INHALATION_SPRAY | Freq: Two times a day (BID) | RESPIRATORY_TRACT | Status: DC
  Administered 2016-07-20 – 2016-07-21 (×3): 2 via RESPIRATORY_TRACT
  Filled 2016-07-20: qty 8.8

## 2016-07-20 MED ORDER — SODIUM CHLORIDE 0.9 % IV SOLN
INTRAVENOUS | Status: DC
  Administered 2016-07-20 – 2016-07-21 (×2): via INTRAVENOUS

## 2016-07-20 MED ORDER — GUAIFENESIN ER 600 MG PO TB12
600.0000 mg | ORAL_TABLET | Freq: Every day | ORAL | Status: DC
  Administered 2016-07-20 – 2016-07-22 (×3): 600 mg via ORAL
  Filled 2016-07-20 (×3): qty 1

## 2016-07-20 MED ORDER — RIFAMPIN 300 MG PO CAPS
600.0000 mg | ORAL_CAPSULE | Freq: Every day | ORAL | Status: DC
  Administered 2016-07-20 – 2016-07-21 (×2): 600 mg via ORAL
  Filled 2016-07-20 (×2): qty 2

## 2016-07-20 MED ORDER — ASPIRIN EC 81 MG PO TBEC
81.0000 mg | DELAYED_RELEASE_TABLET | Freq: Every day | ORAL | Status: DC
  Administered 2016-07-20 – 2016-07-29 (×7): 81 mg via ORAL
  Filled 2016-07-20 (×7): qty 1

## 2016-07-20 MED ORDER — ACETAMINOPHEN 325 MG PO TABS
650.0000 mg | ORAL_TABLET | Freq: Four times a day (QID) | ORAL | Status: DC | PRN
  Administered 2016-07-21: 650 mg via ORAL
  Filled 2016-07-20: qty 2

## 2016-07-20 MED ORDER — ALBUTEROL SULFATE HFA 108 (90 BASE) MCG/ACT IN AERS: 2.0000 | INHALATION_SPRAY | Freq: Four times a day (QID) | RESPIRATORY_TRACT | Status: DC | PRN

## 2016-07-20 MED ORDER — ATORVASTATIN CALCIUM 10 MG PO TABS
10.0000 mg | ORAL_TABLET | Freq: Every day | ORAL | Status: DC
  Administered 2016-07-20 – 2016-07-22 (×3): 10 mg via ORAL
  Filled 2016-07-20 (×3): qty 1

## 2016-07-20 NOTE — ED Notes (Signed)
Patient transported to Ultrasound 

## 2016-07-20 NOTE — ED Provider Notes (Signed)
[facility] Emergency Department Provider Note  Time seen: 10:52 AM  I have reviewed the triage vital signs and the nursing notes.   HISTORY  Chief Complaint Hypotension    HPI [name] is a 70 y.o. male with a past medical history of asthma, MI, COPD, Mycobacterium avium complex lung infection currently on antibiotics who presents to the emergency department for generalized weakness. According to the patient and his wife for the past several days the patient has been extremely weak, with no appetite.  Patient states he feels like his shortness of breath has worsened over the past several days. He also states increased lower extremity edema for the past 2 days bilaterally. Denies leg pain. Denies fever. Upon arrival to the emergency department patient found be hypotensive and brought back to the room immediately. Patient denies any chest pain. Denies any sputum production. Denies any abdominal pain.  Past Medical History:  Diagnosis Date  . Acute MI   . Arthritis of lumbar spine ([place])   . Asthma   . Clotting disorder ([place])   . Collapsed lung    x3  . COPD (chronic obstructive pulmonary disease) ([place])   . Dyspnea   . History of benign thyroid tumor   . Lung cancer ([place])   . Mycobacterium avium complex ([place]) 06/24/2016  . Pneumonia   . Pneumothorax   . Small cell carcinoma of lung ([place])   . Spontaneous pneumothorax     Patient Active Problem List   Diagnosis Date Noted  . COPD exacerbation ([place]) 06/28/2016  . Mycobacterium avium complex ([place]) 06/24/2016  . Bronchiectasis without acute exacerbation ([place]) 05/20/2016  . Lung disease, bullous ([place]) 05/19/2016  . Peripheral edema 12/30/2014  . Encounter for therapeutic drug monitoring 03/15/2014  . Mediastinal adenopathy 01/26/2014  . Tick bite of axillary region 12/26/2013  . Pulmonary embolus ([place]) 12/22/2013  . PNA (pneumonia) 12/22/2013  . Hyperlipidemia 12/26/2012  . NSTEMI (non-ST elevated  myocardial infarction) ([place]) 06/03/2012  . HTN (hypertension) 06/03/2012  . Adenocarcinoma of left lung ([place]) 04/30/2008  . DYSPNEA 11/18/2007  . COPD mixed type ([place]) 11/06/2007  . SPONTANEOUS PNEUMOTHORAX 10/31/2007    Past Surgical History:  Procedure Laterality Date  . APPENDECTOMY    . benign thryoid nodule resected    . CARDIAC CATHETERIZATION  2013   [facility]  . CHEST TUBE INSERTION    . COLONOSCOPY N/A 08/30/2015   Procedure: COLONOSCOPY;  Surgeon: Rogene Houston, MD;  Location: [facility];  Service: Endoscopy;  Laterality: N/A;  1:45  . left upper lobectomy for small cell lung cancer    . VATS R thoracotomy      Prior to Admission medications   Medication Sig Start Date End Date Taking? Authorizing Provider  acetaminophen (TYLENOL) 325 MG tablet Take 650 mg by mouth as needed.      Historical Provider, MD  albuterol (PROVENTIL HFA;VENTOLIN HFA) 108 (90 Base) MCG/ACT inhaler Inhale 2 puffs into the lungs every 6 (six) hours as needed for wheezing or shortness of breath. 03/02/16   Deneise Lever, MD  aspirin 81 MG EC tablet Take 81 mg by mouth daily. Swallow whole.    Historical Provider, MD  atorvastatin (LIPITOR) 10 MG tablet TAKE 1 TABLET BY MOUTH EVERY DAY 06/17/15   Minna Merritts, MD  azithromycin (ZITHROMAX) 500 MG tablet Take 1 tablet (500 mg total) by mouth daily. 06/24/16   Truman Hayward, MD  chlorthalidone (HYGROTON) 25 MG tablet Take  25 mg by mouth daily. 02/01/15   Historical Provider, MD  ethambutol (MYAMBUTOL) 400 MG tablet Take 3 tablets (1,200 mg total) by mouth daily. 06/24/16   Truman Hayward, MD  Fluticasone-Salmeterol (ADVAIR DISKUS) 100-50 MCG/DOSE AEPB Inhale 1 puff into the lungs 2 (two) times daily. 03/30/16   Deneise Lever, MD  guaiFENesin (MUCINEX) 600 MG 12 hr tablet Take 600 mg by mouth daily.     Historical Provider, MD  losartan (COZAAR) 100 MG tablet Take 100 mg by mouth daily. 11/01/14   Historical Provider, MD  ondansetron (ZOFRAN) 4 MG  tablet Take 1 tablet (4 mg total) by mouth every 6 (six) hours as needed for nausea. 06/30/16   [name], MD  rifampin (RIFADIN) 300 MG capsule Take 2 capsules (600 mg total) by mouth daily. 06/24/16   Truman Hayward, MD  tiotropium (SPIRIVA) 18 MCG inhalation capsule Place 1 capsule (18 mcg total) into inhaler and inhale daily. Patient not taking: Reported on 07/02/2016 06/30/16   [name], MD  traMADol (ULTRAM) 50 MG tablet Takes 2 tablets 4 times daily as needed for pain. 05/25/11   Historical Provider, MD    Allergies  Allergen Reactions  . Other Other (See Comments)    Hydromet cough syrup-causes GI upset  . Codeine Nausea And Vomiting    Family History  Problem Relation Age of Onset  . Heart disease Mother   . Colon cancer Father   . Heart failure Brother     Social History Social History  Substance Use Topics  . Smoking status: Former Smoker    Packs/day: 3.00    Years: 30.00    Types: Cigars    Quit date: 03/02/1992  . Smokeless tobacco: Never Used     Comment: quit 22 yrs ago  . Alcohol use No    Review of Systems Constitutional: Negative for fever.Positive for generalized weakness Cardiovascular: Negative for chest pain. Respiratory: Positive for shortness of breath. Gastrointestinal: Negative for abdominal pain, vomiting and diarrhea. Genitourinary: Negative for dysuria. Neurological: Negative for headache 10-point ROS otherwise negative.  ____________________________________________   PHYSICAL EXAM:  VITAL SIGNS: ED Triage Vitals  Enc Vitals Group     BP 07/20/16 1034 (!) 72/46     Pulse Rate 07/20/16 1034 (!) 118     Resp 07/20/16 1034 18     Temp 07/20/16 1019 98 F (36.7 C)     Temp Source 07/20/16 1019 Oral     SpO2 07/20/16 1039 98 %     Weight 07/20/16 1039 164 lb 9.6 oz (74.7 kg)     Height --      Head Circumference --      Peak Flow --      Pain Score 07/20/16 1039 0     Pain Loc --      Pain Edu? --       Excl. in [place]? --     Constitutional: Alert and oriented. Well appearing and in no distress. Eyes: Normal exam ENT   Head: Normocephalic and atraumatic   Mouth/Throat: Mucous membranes are moist. Cardiovascular: Normal rate, regular rhythm. No murmur Respiratory: Normal respiratory effort without tachypnea nor retractions. Breath sounds are clear  Gastrointestinal: Soft and nontender. No distention.  Musculoskeletal: 3+ lower extremity edema equal bilaterally. No calf tenderness. Neurologic:  Normal speech and language. No gross focal neurologic deficits  Skin:  Skin is warm, dry and intact.  Psychiatric: Mood and affect are normal. Speech and behavior are  normal.   ____________________________________________    EKG  EKG reviewed and interpreted by myself shows sinus tachycardia 106 bpm, narrow QRS, normal axis, largely normal intervals besides a prolonged QTC, nonspecific but no concerning ST changes. Mild Respiratory interference on EKG.  ____________________________________________    RADIOLOGY  Chest x-ray shows stable  ____________________________________________   INITIAL IMPRESSION / ASSESSMENT AND PLAN / ED COURSE  Pertinent labs & imaging results that were available during my care of the patient were reviewed by me and considered in my medical decision making (see chart for details).  The patient presents the emergency department with generalized weakness. Initially found to be hypotensive however upon recheck patient's blood pressures greater than 112 systolic without intervention. Patient has a history of Mycobacterium avium complex lung infection currently on antibiotics for the past 3 weeks. We will check labs, troponin, chest x-ray, and closely monitor in the emergency department. Patient is agreeable to this plan. Patient states his doctor recently put him on Lasix, but he has had very significant lower extremity edema over the past 2 days.  Chest x-ray  shows stable chronic changes. Patient labs are consistent with hyponatremia at 124. Patient remains extremely weak, falling asleep midsentence. Patient has received IV fluids, we will continue with IV fluid administration and admit to the hospital for further workup and treatment ____________________________________________   FINAL CLINICAL IMPRESSION(S) / ED DIAGNOSES  Peripheral edema Dyspnea Generalized weakness Hyponatremia   Harvest Dark, MD 07/20/16 1339

## 2016-07-20 NOTE — H&P (Signed)
[facility] [place] NAME: [name]    MR#:  [id number]  DATE OF BIRTH:  08-11-1946  DATE OF ADMISSION:  07/20/2016  PRIMARY CARE PHYSICIAN: Asencion Noble, MD   REQUESTING/REFERRING PHYSICIAN: Dr. Harvest Dark  CHIEF COMPLAINT:   Chief Complaint  Patient presents with  . Hypotension    HISTORY OF PRESENT ILLNESS:  [name]  is a 70 y.o. male with a known history of COPD, MAI infection, history of small cell carcinoma of the lung, chronic respiratory failure, previous history of hyponatremia, history of coronary artery disease, previous history of DVT who presents to the hospital due to worsening lower extremity edema, weakness and difficulty walking and poor by mouth intake. Patient was recently discharged from the hospital about 2-3 weeks ago after treatment for a COPD flare. Patient has a chronic respiratory infection secondary to MAI and is currently on long-term antibiotics and follows with Dr. Ola Spurr and was recently seen by him in [place] on oral Lasix for his lower extremity edema. As per the wife despite being on the Lasix patient's weakness and low some edema has not improved and therefore she brought him to the ER for further evaluation. Patient was noted to be acutely hyponatremic, dehydrated and acute kidney injury and therefore hospitalist services were contacted further treatment and evaluation. As per the wife patient has also been having diarrhea since yesterday as he's had over 10 episodes which have been nonbloody in nature.  PAST MEDICAL HISTORY:   Past Medical History:  Diagnosis Date  . Acute MI   . Arthritis of lumbar spine ([place])   . Asthma   . Clotting disorder ([place])   . Collapsed lung    x3  . COPD (chronic obstructive pulmonary disease) ([place])   . Dyspnea   . History of benign thyroid tumor   . Lung cancer ([place])   . Mycobacterium avium complex ([place]) 06/24/2016  . Pneumonia   . Pneumothorax   . Small cell  carcinoma of lung ([place])   . Spontaneous pneumothorax     PAST SURGICAL HISTORY:   Past Surgical History:  Procedure Laterality Date  . APPENDECTOMY    . benign thryoid nodule resected    . CARDIAC CATHETERIZATION  2013   [facility]  . CHEST TUBE INSERTION    . COLONOSCOPY N/A 08/30/2015   Procedure: COLONOSCOPY;  Surgeon: Rogene Houston, MD;  Location[facility];  Service: Endoscopy;  Laterality: N/A;  1:45  . left upper lobectomy for small cell lung cancer    . VATS R thoracotomy      SOCIAL HISTORY:   Social History  Substance Use Topics  . Smoking status: Former Smoker    Packs/day: 3.00    Years: 30.00    Types: Cigars    Quit date: 03/02/1992  . Smokeless tobacco: Never Used     Comment: quit 22 yrs ago  . Alcohol use No    FAMILY HISTORY:   Family History  Problem Relation Age of Onset  . Heart disease Mother   . Colon cancer Father   . Heart failure Brother     DRUG ALLERGIES:   Allergies  Allergen Reactions  . Other Other (See Comments)    Hydromet cough syrup-causes GI upset  . Codeine Nausea And Vomiting    REVIEW OF SYSTEMS:   Review of Systems  Constitutional: Negative for fever and weight loss.  HENT: Negative for congestion, nosebleeds and tinnitus.   Eyes:  Negative for blurred vision, double vision and redness.  Respiratory: Positive for shortness of breath. Negative for cough and hemoptysis.   Cardiovascular: Positive for leg swelling. Negative for chest pain, orthopnea and PND.  Gastrointestinal: Positive for diarrhea. Negative for abdominal pain, melena, nausea and vomiting.  Genitourinary: Negative for dysuria, hematuria and urgency.  Musculoskeletal: Negative for falls and joint pain.  Neurological: Positive for weakness. Negative for dizziness, tingling, sensory change, focal weakness, seizures and headaches.  Endo/Heme/Allergies: Negative for polydipsia. Does not bruise/bleed easily.  Psychiatric/Behavioral: Negative for depression and  memory loss. The patient is not nervous/anxious.     MEDICATIONS AT HOME:   Prior to Admission medications   Medication Sig Start Date End Date Taking? Authorizing Provider  acetaminophen (TYLENOL) 325 MG tablet Take 650 mg by mouth as needed.     Yes Historical Provider, MD  albuterol (PROVENTIL HFA;VENTOLIN HFA) 108 (90 Base) MCG/ACT inhaler Inhale 2 puffs into the lungs every 6 (six) hours as needed for wheezing or shortness of breath. 03/02/16  Yes Deneise Lever, MD  aspirin 81 MG EC tablet Take 81 mg by mouth daily. Swallow whole.   Yes Historical Provider, MD  atorvastatin (LIPITOR) 10 MG tablet TAKE 1 TABLET BY MOUTH EVERY DAY 06/17/15  Yes Minna Merritts, MD  azithromycin (ZITHROMAX) 500 MG tablet Take 1 tablet (500 mg total) by mouth daily. 06/24/16  Yes Truman Hayward, MD  ethambutol (MYAMBUTOL) 400 MG tablet Take 3 tablets (1,200 mg total) by mouth daily. 06/24/16  Yes Truman Hayward, MD  Fluticasone-Salmeterol (ADVAIR DISKUS) 100-50 MCG/DOSE AEPB Inhale 1 puff into the lungs 2 (two) times daily. 03/30/16  Yes Deneise Lever, MD  furosemide (LASIX) 20 MG tablet Take 20 mg by mouth daily.   Yes Historical Provider, MD  guaiFENesin (MUCINEX) 600 MG 12 hr tablet Take 600 mg by mouth daily.    Yes Historical Provider, MD  losartan (COZAAR) 100 MG tablet Take 100 mg by mouth daily. 11/01/14  Yes Historical Provider, MD  ondansetron (ZOFRAN) 4 MG tablet Take 1 tablet (4 mg total) by mouth every 6 (six) hours as needed for nausea. 06/30/16  Yes [name], MD  rifampin (RIFADIN) 300 MG capsule Take 2 capsules (600 mg total) by mouth daily. Patient taking differently: Take 600 mg by mouth daily. Take on empty stomach 06/24/16  Yes Truman Hayward, MD  traMADol (ULTRAM) 50 MG tablet Takes 2 tablets 4 times daily as needed for pain. 05/25/11  Yes Historical Provider, MD      VITAL SIGNS:  Blood pressure (!) 106/50, pulse 64, temperature 98.3 F (36.8 C), temperature  source Oral, resp. rate (!) 22, weight 74.7 kg (164 lb 9.6 oz), SpO2 98 %.  PHYSICAL EXAMINATION:  Physical Exam  GENERAL:  69 y.o.-year-old patient lying in the bed lethargic but in NAD.   EYES: Pupils equal, round, reactive to light and accommodation. No scleral icterus. Extraocular muscles intact.  HEENT: Head atraumatic, normocephalic. Oropharynx and nasopharynx clear. No oropharyngeal erythema, moist oral mucosa  NECK:  Supple, no jugular venous distention. No thyroid enlargement, no tenderness.  LUNGS: Prolonged Insp. & exp. phase, no wheezing, rales, rhonchi. No use of accessory muscles of respiration.  CARDIOVASCULAR: S1, S2 RRR. No murmurs, rubs, gallops, clicks.  ABDOMEN: Soft, nontender, nondistended. Bowel sounds present. No organomegaly or mass.  EXTREMITIES: +1-2 edema b/l, No cyanosis, or clubbing. + 2 pedal & radial pulses b/l.   NEUROLOGIC: Cranial nerves II through  XII are intact. No focal Motor or sensory deficits appreciated b/l. Globally weak.  PSYCHIATRIC: The patient is alert and oriented x 3.   SKIN: No obvious rash, lesion, or ulcer.   LABORATORY PANEL:   CBC  Recent Labs Lab 07/20/16 1032  WBC 17.6*  HGB 8.8*  HCT 26.1*  PLT 462*   ------------------------------------------------------------------------------------------------------------------  Chemistries   Recent Labs Lab 07/20/16 1032  NA 124*  K 3.5  CL 91*  CO2 20*  GLUCOSE 127*  BUN 15  CREATININE 1.59*  CALCIUM 8.4*  AST 28  ALT 20  ALKPHOS 48  BILITOT 0.5   ------------------------------------------------------------------------------------------------------------------  Cardiac Enzymes  Recent Labs Lab 07/20/16 1032  TROPONINI <0.03   ------------------------------------------------------------------------------------------------------------------  RADIOLOGY:  Dg Chest Portable 1 View  Result Date: 07/20/2016 CLINICAL DATA:  Shortness of breath, weakness. EXAM:  PORTABLE CHEST 1 VIEW COMPARISON:  06/28/2016.  CT 06/28/2016. FINDINGS: Again noted is the area fluid-filled cavity at the left lung base with surrounding airspace disease. Chronic density at the right lung base and in the left upper lobe. Appearance is unchanged since prior study. Probable small left effusion. Heart is normal size. IMPRESSION: Stable chronic lung changes including partially fluid-filled cavitary area at the left lung base. Electronically Signed   By: Rolm Baptise M.D.   On: 07/20/2016 11:07     IMPRESSION AND PLAN:   70 year old male with past medical history of COPD, chronic respiratory failure, history of MAI infection, lung cancer, previous history of hyponatremia presents to the hospital due to weakness, difficulty walking poor by mouth intake and worsening lower extremity edema.   1. Worsening lower extremity edema-etiology unclear presently. Patient has no previous history of congestive heart failure. -Possibly related to chronic venous stasis. I will get a wound team consult. -Get a two-dimensional echocardiogram to check his ejection fraction, get a cardiology consult. Clinically patient does not appear to be in congestive heart failure. -Dopplers of lower extremity done but results are pending.  2. Hyponatremia-hypotonic hypovolemic in nature. -I will gently hydrate the patient with IV fluids. Follow sodium. Hold Lasix for now.  3. Acute kidney injury-secondary to dehydration. Give gentle IV fluids, follow BUN and creatinine.  4. MAI infection-continue rifampin, ethambutol, Zithromax -I will get infectious disease consult.  5. Diarrhea-likely related to the patient's antibiotics. -I will check stool comprehensive culture.   6. Hyperlipidemia - cont. Atorvastatin  7. HTN - bp. On low side.  Hold anti-HTN for now.   8. COPD - no acute exacerbation.  - cont. Dulera, albuterol inhaler.   All the records are reviewed and case discussed with ED  provider. Management plans discussed with the patient, family and they are in agreement.  CODE STATUS: Full code  TOTAL TIME TAKING CARE OF THIS PATIENT: 45 minutes.    Henreitta Leber M.D on 07/20/2016 at 2:11 PM  Between 7am to 6pm - Pager - [phone]  After 6pm go to [email or web address] - password [email or web address] [facility]  Office  [phone]  CC: Primary care physician; Asencion Noble, MD

## 2016-07-20 NOTE — ED Triage Notes (Signed)
Pt with recent lung infection and has been on antibiotics, feeling short of breath and weakness. Pt has bilateral lower extremity swelling.

## 2016-07-21 DIAGNOSIS — J449 Chronic obstructive pulmonary disease, unspecified: Secondary | ICD-10-CM

## 2016-07-21 DIAGNOSIS — R531 Weakness: Secondary | ICD-10-CM

## 2016-07-21 DIAGNOSIS — E871 Hypo-osmolality and hyponatremia: Secondary | ICD-10-CM

## 2016-07-21 DIAGNOSIS — D509 Iron deficiency anemia, unspecified: Secondary | ICD-10-CM | POA: Diagnosis present

## 2016-07-21 DIAGNOSIS — C3492 Malignant neoplasm of unspecified part of left bronchus or lung: Secondary | ICD-10-CM

## 2016-07-21 DIAGNOSIS — D508 Other iron deficiency anemias: Secondary | ICD-10-CM

## 2016-07-21 DIAGNOSIS — A310 Pulmonary mycobacterial infection: Secondary | ICD-10-CM

## 2016-07-21 LAB — ECHOCARDIOGRAM COMPLETE: WEIGHTICAEL: 2633.6 [oz_av]

## 2016-07-21 LAB — BASIC METABOLIC PANEL
Anion gap: 9 (ref 5–15)
BUN: 12 mg/dL (ref 6–20)
CALCIUM: 7.5 mg/dL — AB (ref 8.9–10.3)
CO2: 19 mmol/L — ABNORMAL LOW (ref 22–32)
CREATININE: 1.05 mg/dL (ref 0.61–1.24)
Chloride: 100 mmol/L — ABNORMAL LOW (ref 101–111)
GFR calc Af Amer: >60 mL/min (ref >60)
GLUCOSE: 95 mg/dL (ref 65–99)
POTASSIUM: 3.2 mmol/L — AB (ref 3.5–5.1)
SODIUM: 128 mmol/L — AB (ref 135–145)

## 2016-07-21 LAB — CBC
HCT: 20.8 % — ABNORMAL LOW (ref 40.0–52.0)
HCT: 21.4 % — ABNORMAL LOW (ref 40.0–52.0)
Hemoglobin: 6.9 g/dL — ABNORMAL LOW (ref 13.0–18.0)
Hemoglobin: 7.2 g/dL — ABNORMAL LOW (ref 13.0–18.0)
MCH: 28.4 pg (ref 26.0–34.0)
MCH: 28.3 pg (ref 26.0–34.0)
MCHC: 33.2 g/dL (ref 32.0–36.0)
MCHC: 33.7 g/dL (ref 32.0–36.0)
MCV: 85.7 fL (ref 80.0–100.0)
MCV: 84.1 fL (ref 80.0–100.0)
PLATELETS: 337 10*3/uL (ref 150–440)
PLATELETS: 345 10*3/uL (ref 150–440)
RBC: 2.42 MIL/uL — ABNORMAL LOW (ref 4.40–5.90)
RBC: 2.54 MIL/uL — ABNORMAL LOW (ref 4.40–5.90)
RDW: 15.8 % — AB (ref 11.5–14.5)
RDW: 15.7 % — AB (ref 11.5–14.5)
WBC: 10.6 10*3/uL (ref 3.8–10.6)
WBC: 11.3 10*3/uL — AB (ref 3.8–10.6)

## 2016-07-21 LAB — OSMOLALITY: OSMOLALITY: 263 mosm/kg — AB (ref 275–295)

## 2016-07-21 LAB — GLUCOSE, CAPILLARY: GLUCOSE-CAPILLARY: 105 mg/dL — AB (ref 65–99)

## 2016-07-21 MED ORDER — POTASSIUM CHLORIDE CRYS ER 20 MEQ PO TBCR
40.0000 meq | EXTENDED_RELEASE_TABLET | Freq: Two times a day (BID) | ORAL | Status: AC
  Administered 2016-07-21 (×2): 40 meq via ORAL
  Filled 2016-07-21 (×2): qty 2

## 2016-07-21 NOTE — Consult Note (Signed)
[facility]  CONSULT NOTE    Date: 07/21/2016                  Patient Name:  [name]  MRN: [id number]  DOB: [birth year]  Age / Sex: 70 y.o., male         PCP: Asencion Noble, MD                 Service Requesting Consult: Dr. Anselm Jungling                 Reason for Consult: Hyponatremia            History of Present Illness: [name] is a 70 y.o. white male with history of lung cancer, DVT, pulmonary hypertension, mycobacterium infection, chronic hyponatremia, anemia, pneumothorax, and COPD, who was admitted to [facility] on 07/20/2016 for Hyponatremia [E87.1] General weakness [R53.1]   Patient was previously admitted recently from 10/1 to 10/3 for acute exacerbation of COPD.   He returns today with shortness of breath, cough, weakness and hyponatremia. He states he has not been eating well. Admitted with sodium of 124. He was started on IV NS and sodium has improved to 128.   With peripheral weeping edema and erythema. Dopplers were done negative for DVT.    Medications: Outpatient medications: Prescriptions Prior to Admission  Medication Sig Dispense Refill Last Dose  . acetaminophen (TYLENOL) 325 MG tablet Take 650 mg by mouth as needed.     prn at prn  . albuterol (PROVENTIL HFA;VENTOLIN HFA) 108 (90 Base) MCG/ACT inhaler Inhale 2 puffs into the lungs every 6 (six) hours as needed for wheezing or shortness of breath. 1 Inhaler 12 prn at prn  . aspirin 81 MG EC tablet Take 81 mg by mouth daily. Swallow whole.   07/19/2016 at 1000  . atorvastatin (LIPITOR) 10 MG tablet TAKE 1 TABLET BY MOUTH EVERY DAY 90 tablet 3 07/19/2016 at 1000  . azithromycin (ZITHROMAX) 500 MG tablet Take 1 tablet (500 mg total) by mouth daily. 30 tablet 11 07/19/2016 at 1000  . ethambutol (MYAMBUTOL) 400 MG tablet Take 3 tablets (1,200 mg total) by mouth daily. 90 tablet 11 07/19/2016 at 1000  . Fluticasone-Salmeterol (ADVAIR DISKUS) 100-50 MCG/DOSE AEPB Inhale 1 puff into the  lungs 2 (two) times daily. 60 each 12 07/20/2016 at Unknown time  . furosemide (LASIX) 20 MG tablet Take 20 mg by mouth daily.   07/19/2016 at 1000  . guaiFENesin (MUCINEX) 600 MG 12 hr tablet Take 600 mg by mouth daily.    prn at prn  . losartan (COZAAR) 100 MG tablet Take 100 mg by mouth daily.  3 07/19/2016 at 1900  . ondansetron (ZOFRAN) 4 MG tablet Take 1 tablet (4 mg total) by mouth every 6 (six) hours as needed for nausea. 60 tablet 4 prn at prn  . rifampin (RIFADIN) 300 MG capsule Take 2 capsules (600 mg total) by mouth daily. (Patient taking differently: Take 600 mg by mouth daily. Take on empty stomach) 60 capsule 11 07/19/2016 at 1000  . traMADol (ULTRAM) 50 MG tablet Takes 2 tablets 4 times daily as needed for pain.   prn at prn    Current medications: Current Facility-Administered Medications  Medication Dose Route Frequency Provider Last Rate Last Dose  . acetaminophen (TYLENOL) tablet 650 mg  650 mg Oral Q6H PRN Henreitta Leber, MD       Or  . acetaminophen (TYLENOL) suppository 650 mg  650 mg  Rectal Q6H PRN Henreitta Leber, MD      . albuterol (PROVENTIL) (2.5 MG/3ML) 0.083% nebulizer solution 2.5 mg  2.5 mg Nebulization Q6H PRN Henreitta Leber, MD      . aspirin EC tablet 81 mg  81 mg Oral Daily Henreitta Leber, MD   81 mg at 07/21/16 0942  . atorvastatin (LIPITOR) tablet 10 mg  10 mg Oral Daily Henreitta Leber, MD   10 mg at 07/21/16 0942  . azithromycin (ZITHROMAX) tablet 500 mg  500 mg Oral q1800 Henreitta Leber, MD   500 mg at 07/20/16 1826  . ethambutol (MYAMBUTOL) tablet 1,200 mg  1,200 mg Oral Daily Henreitta Leber, MD   1,200 mg at 07/21/16 0942  . guaiFENesin (MUCINEX) 12 hr tablet 600 mg  600 mg Oral Daily Henreitta Leber, MD   600 mg at 07/21/16 0942  . heparin injection 5,000 Units  5,000 Units Subcutaneous Q8H Henreitta Leber, MD   5,000 Units at 07/21/16 0641  . mometasone-formoterol (DULERA) 100-5 MCG/ACT inhaler 2 puff  2 puff Inhalation BID Henreitta Leber, MD    2 puff at 07/21/16 0940  . ondansetron (ZOFRAN) tablet 4 mg  4 mg Oral Q6H PRN Henreitta Leber, MD       Or  . ondansetron (ZOFRAN) injection 4 mg  4 mg Intravenous Q6H PRN Henreitta Leber, MD      . ondansetron (ZOFRAN) tablet 4 mg  4 mg Oral Q6H PRN Henreitta Leber, MD      . potassium chloride SA (K-DUR,KLOR-CON) CR tablet 40 mEq  40 mEq Oral BID Areta Haber Dunn, PA-C   40 mEq at 07/21/16 0942  . rifampin (RIFADIN) capsule 600 mg  600 mg Oral Daily Henreitta Leber, MD   600 mg at 07/21/16 7824      Allergies: Allergies  Allergen Reactions  . Other Other (See Comments)    Hydromet cough syrup-causes GI upset  . Codeine Nausea And Vomiting      Past Medical History: Past Medical History:  Diagnosis Date  . Acute MI   . Arthritis of lumbar spine ([place])   . Asthma   . Clotting disorder ([place])   . Collapsed lung    x3  . COPD (chronic obstructive pulmonary disease) ([place])   . Dyspnea   . History of benign thyroid tumor   . Lung cancer ([place])   . Mycobacterium avium complex ([place]) 06/24/2016  . Pneumonia   . Pneumothorax   . Small cell carcinoma of lung ([place])   . Spontaneous pneumothorax      Past Surgical History: Past Surgical History:  Procedure Laterality Date  . APPENDECTOMY    . benign thryoid nodule resected    . CARDIAC CATHETERIZATION  2013   [facility]  . CHEST TUBE INSERTION    . COLONOSCOPY N/A 08/30/2015   Procedure: COLONOSCOPY;  Surgeon: Rogene Houston, MD;  [facility];  Service: Endoscopy;  Laterality: N/A;  1:45  . left upper lobectomy for small cell lung cancer    . VATS R thoracotomy       Family History: Family History  Problem Relation Age of Onset  . Heart disease Mother   . Colon cancer Father   . Heart failure Brother      Social History: Social History   Social History  . Marital status: Married    Spouse name: N/A  . Number of children: 1  .  Years of education: N/A   Occupational History  . 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 Smoking status: Former Smoker    Packs/day: 3.00    Years: 30.00    Types: Cigars    Quit date: 03/02/1992  . Smokeless tobacco: Never Used     Comment: quit 22 yrs ago  . Alcohol use No  . Drug use: No  . Sexual activity: Yes   Other Topics Concern  . Not on file   Social History Narrative  . No narrative on file     Review of Systems: Review of Systems  Constitutional: Positive for chills, diaphoresis, malaise/fatigue and weight loss. Negative for fever.  HENT: Negative for congestion, ear discharge, ear pain, hearing loss, nosebleeds, sore throat and tinnitus.   Eyes: Negative for blurred vision, double vision, photophobia, pain, discharge and redness.  Respiratory: Positive for cough, shortness of breath and wheezing. Negative for hemoptysis, sputum production and stridor.   Cardiovascular: Positive for leg swelling and PND. Negative for chest pain, palpitations, orthopnea and claudication.  Gastrointestinal: Positive for diarrhea. Negative for abdominal pain, blood in stool, constipation, heartburn, melena, nausea and vomiting.  Genitourinary: Negative for dysuria, flank pain, frequency, hematuria and urgency.  Musculoskeletal: Negative.  Negative for back pain, falls, joint pain, myalgias and neck pain.  Skin: Positive for rash. Negative for itching.  Neurological: Positive for weakness. Negative for dizziness, tingling, tremors, sensory change, speech change, focal weakness, seizures, loss of consciousness and headaches.  Endo/Heme/Allergies: Negative.  Negative for environmental allergies and polydipsia. Does not bruise/bleed easily.  Psychiatric/Behavioral: Negative.  Negative for depression, hallucinations, memory loss, substance abuse and suicidal ideas. The patient is not nervous/anxious and does not have insomnia.     Vital Signs: Blood pressure (!) 116/54, pulse 92, temperature 98.5 F (36.9 C), temperature source Oral, resp.  rate (!) 24, height '5\' 7"'$  (1.702 m), weight 74.4 kg (164 lb), SpO2 100 %.  Weight trends: Filed Weights   07/20/16 1039 07/20/16 1545  Weight: 74.7 kg (164 lb 9.6 oz) 74.4 kg (164 lb)    Physical Exam: General: NAD, laying in bed  Head: Normocephalic, atraumatic. Moist oral mucosal membranes  Eyes: Anicteric, PERRL  Neck: Supple, trachea midline  Lungs:  Bilateral wheezing  Heart: Regular rate and rhythm  Abdomen:  Soft, nontender,   Extremities: 3+ bilateral peripheral edema and erythema  Neurologic: Nonfocal, moving all four extremities  Skin: No lesions        Lab results: Basic Metabolic Panel:  Recent Labs Lab 07/20/16 1032 07/21/16 0522  NA 124* 128*  K 3.5 3.2*  CL 91* 100*  CO2 20* 19*  GLUCOSE 127* 95  BUN 15 12  CREATININE 1.59* 1.05  CALCIUM 8.4* 7.5*    Liver Function Tests:  Recent Labs Lab 07/20/16 1032  AST 28  ALT 20  ALKPHOS 48  BILITOT 0.5  PROT 6.5  ALBUMIN 3.0*   No results for input(s): LIPASE, AMYLASE in the last 168 hours. No results for input(s): AMMONIA in the last 168 hours.  CBC:  Recent Labs Lab 07/20/16 1032 07/21/16 0522  WBC 17.6* 10.6  HGB 8.8* 6.9*  HCT 26.1* 20.8*  MCV 84.4 85.7  PLT 462* 337    Cardiac Enzymes:  Recent Labs Lab 07/20/16 1032  TROPONINI <0.03    BNP: Invalid input(s): POCBNP  CBG:  Recent Labs Lab 07/21/16 0028  GLUCAP 105*    Microbiology: Results  for orders placed or performed during the hospital encounter of 07/20/16  Gastrointestinal Panel by PCR , Stool     Status: None   Collection Time: 07/20/16  6:40 PM  Result Value Ref Range Status   Campylobacter species NOT DETECTED NOT DETECTED Final   Plesimonas shigelloides NOT DETECTED NOT DETECTED Final   Salmonella species NOT DETECTED NOT DETECTED Final   Yersinia enterocolitica NOT DETECTED NOT DETECTED Final   Vibrio species NOT DETECTED NOT DETECTED Final   Vibrio cholerae NOT DETECTED NOT DETECTED Final    Enteroaggregative E coli (EAEC) NOT DETECTED NOT DETECTED Final   Enteropathogenic E coli (EPEC) NOT DETECTED NOT DETECTED Final   Enterotoxigenic E coli (ETEC) NOT DETECTED NOT DETECTED Final   Shiga like toxin producing E coli (STEC) NOT DETECTED NOT DETECTED Final   Shigella/Enteroinvasive E coli (EIEC) NOT DETECTED NOT DETECTED Final   Cryptosporidium NOT DETECTED NOT DETECTED Final   Cyclospora cayetanensis NOT DETECTED NOT DETECTED Final   Entamoeba histolytica NOT DETECTED NOT DETECTED Final   Giardia lamblia NOT DETECTED NOT DETECTED Final   Adenovirus F40/41 NOT DETECTED NOT DETECTED Final   Astrovirus NOT DETECTED NOT DETECTED Final   Norovirus GI/GII NOT DETECTED NOT DETECTED Final   Rotavirus A NOT DETECTED NOT DETECTED Final   Sapovirus (I, II, IV, and V) NOT DETECTED NOT DETECTED Final  C difficile quick scan w PCR reflex     Status: None   Collection Time: 07/20/16  6:40 PM  Result Value Ref Range Status   C Diff antigen NEGATIVE NEGATIVE Final   C Diff toxin NEGATIVE NEGATIVE Final   C Diff interpretation No C. difficile detected.  Final    Coagulation Studies: No results for input(s): LABPROT, INR in the last 72 hours.  Urinalysis: No results for input(s): COLORURINE, LABSPEC, PHURINE, GLUCOSEU, HGBUR, BILIRUBINUR, KETONESUR, PROTEINUR, UROBILINOGEN, NITRITE, LEUKOCYTESUR in the last 72 hours.  Invalid input(s): APPERANCEUR    Imaging: US Venous Img Lower Bilateral  Result Date: 07/20/2016 CLINICAL DATA:  Bilateral lower extremity swelling for 2 months. History of DVT. EXAM: BILATERAL LOWER EXTREMITY VENOUS DUPLEX ULTRASOUND TECHNIQUE: Doppler venous assessment of the left lower extremity deep venous system was performed, including characterization of spectral flow, compressibility, and phasicity. COMPARISON:  None. FINDINGS: There is complete compressibility of the bilateral common femoral, femoral, and popliteal veins. Doppler analysis demonstrates respiratory  phasicity and augmentation of flow with calf compression. No obvious superficial vein or calf vein thrombosis. IMPRESSION: No evidence of lower extremity DVT. Electronically Signed   By: Marybelle Killings M.D.   On: 07/20/2016 15:27   Dg Chest Portable 1 View  Result Date: 07/20/2016 CLINICAL DATA:  Shortness of breath, weakness. EXAM: PORTABLE CHEST 1 VIEW COMPARISON:  06/28/2016.  CT 06/28/2016. FINDINGS: Again noted is the area fluid-filled cavity at the left lung base with surrounding airspace disease. Chronic density at the right lung base and in the left upper lobe. Appearance is unchanged since prior study. Probable small left effusion. Heart is normal size. IMPRESSION: Stable chronic lung changes including partially fluid-filled cavitary area at the left lung base. Electronically Signed   By: Rolm Baptise M.D.   On: 07/20/2016 11:07      Assessment & Plan: [name] is a 70 y.o. white male with history of lung cancer, DVT, pulmonary hypertension, mycobacterium infection, chronic hyponatremia, anemia, pneumothorax, and COPD, who was admitted to [facility] on 07/20/2016   1. Hyponatremia: hypervolumic. Improved with IV fluids - discontinue IV  fluids due to peripheral edema - Check serum and urine studies - Start fluid restriction - Will consider salt tablets if no improvement.   2. Hypokalemia: also due to poor PO intake and NS - Potassium chloride PO   3. Edema: with bilateral venous insufficiency and hypoalbuminemia - wound care consult. Patient may benefit from [facility] boots or compression wrappings.   4. Metabolic Acidosis: exacerbated by NS - discontinue NS, monitor  5. Anemia: hemoglobin 6.9 - transfusion as per primary team.    LOS: Christine, [place] 10/24/201711:11 AM

## 2016-07-21 NOTE — Progress Notes (Signed)
[facility] [place] NAME: [name]    MR#:  [id number]  DATE OF BIRTH:  Oct 20, 1945  SUBJECTIVE:  CHIEF COMPLAINT:   Chief Complaint  Patient presents with  . Hypotension     Have MAI infection with cavity in lungs, and had leg swellings so started on lasix, now weak for last few days, also have c/o swellowing difficulty.  REVIEW OF SYSTEMS:  CONSTITUTIONAL: No fever,positive for fatigue or weakness.  EYES: No blurred or double vision.  EARS, NOSE, AND THROAT: No tinnitus or ear pain.  RESPIRATORY: No cough, shortness of breath, wheezing or hemoptysis.  CARDIOVASCULAR: No chest pain, orthopnea,positive for edema.  GASTROINTESTINAL: No nausea, vomiting, diarrhea or abdominal pain.  GENITOURINARY: No dysuria, hematuria.  ENDOCRINE: No polyuria, nocturia,  HEMATOLOGY: No anemia, easy bruising or bleeding SKIN: No rash or lesion. MUSCULOSKELETAL: No joint pain or arthritis.   NEUROLOGIC: No tingling, numbness, weakness.  PSYCHIATRY: No anxiety or depression.   ROS  DRUG ALLERGIES:   Allergies  Allergen Reactions  . Other Other (See Comments)    Hydromet cough syrup-causes GI upset  . Codeine Nausea And Vomiting    VITALS:  Blood pressure (!) 112/41, pulse 98, temperature 98.7 F (37.1 C), temperature source Oral, resp. rate 18, height '5\' 7"'$  (1.702 m), weight 74.4 kg (164 lb), SpO2 100 %.  PHYSICAL EXAMINATION:  GENERAL:  70 y.o.-year-old patient lying in the bed with no acute distress.  EYES: Pupils equal, round, reactive to light and accommodation. No scleral icterus. Extraocular muscles intact. Conjunctiva pale. HEENT: Head atraumatic, normocephalic. Oropharynx and nasopharynx clear.  NECK:  Supple, no jugular venous distention. No thyroid enlargement, no tenderness.  LUNGS: Normal breath sounds bilaterally, no wheezing, rales,rhonchi or crepitation. No use of accessory muscles of respiration.  CARDIOVASCULAR: S1, S2 normal.  No murmurs, rubs, or gallops.  ABDOMEN: Soft, nontender, nondistended. Bowel sounds present. No organomegaly or mass.  EXTREMITIES: positive for pedal edema,no cyanosis, or clubbing. Some redness on left leg. NEUROLOGIC: Cranial nerves II through XII are intact. Muscle strength 4/5 in all extremities. Sensation intact. Gait not checked.  PSYCHIATRIC: The patient is alert and oriented x 2.  SKIN: No obvious rash, lesion, or ulcer.   Physical Exam LABORATORY PANEL:   CBC  Recent Labs Lab 07/21/16 1130  WBC 11.3*  HGB 7.2*  HCT 21.4*  PLT 345   ------------------------------------------------------------------------------------------------------------------  Chemistries   Recent Labs Lab 07/20/16 1032 07/21/16 0522  NA 124* 128*  K 3.5 3.2*  CL 91* 100*  CO2 20* 19*  GLUCOSE 127* 95  BUN 15 12  CREATININE 1.59* 1.05  CALCIUM 8.4* 7.5*  AST 28  --   ALT 20  --   ALKPHOS 48  --   BILITOT 0.5  --    ------------------------------------------------------------------------------------------------------------------  Cardiac Enzymes  Recent Labs Lab 07/20/16 1032  TROPONINI <0.03   ------------------------------------------------------------------------------------------------------------------  RADIOLOGY:  US Venous Img Lower Bilateral  Result Date: 07/20/2016 CLINICAL DATA:  Bilateral lower extremity swelling for 2 months. History of DVT. EXAM: BILATERAL LOWER EXTREMITY VENOUS DUPLEX ULTRASOUND TECHNIQUE: Doppler venous assessment of the left lower extremity deep venous system was performed, including characterization of spectral flow, compressibility, and phasicity. COMPARISON:  None. FINDINGS: There is complete compressibility of the bilateral common femoral, femoral, and popliteal veins. Doppler analysis demonstrates respiratory phasicity and augmentation of flow with calf compression. No obvious superficial vein or calf vein thrombosis. IMPRESSION: No evidence of  lower extremity DVT. Electronically  Signed   By: Marybelle Killings M.D.   On: 07/20/2016 15:27   Dg Chest Portable 1 View  Result Date: 07/20/2016 CLINICAL DATA:  Shortness of breath, weakness. EXAM: PORTABLE CHEST 1 VIEW COMPARISON:  06/28/2016.  CT 06/28/2016. FINDINGS: Again noted is the area fluid-filled cavity at the left lung base with surrounding airspace disease. Chronic density at the right lung base and in the left upper lobe. Appearance is unchanged since prior study. Probable small left effusion. Heart is normal size. IMPRESSION: Stable chronic lung changes including partially fluid-filled cavitary area at the left lung base. Electronically Signed   By: Rolm Baptise M.D.   On: 07/20/2016 11:07    ASSESSMENT AND PLAN:   Principal Problem:   Anemia Active Problems:   Adenocarcinoma of left lung (HCC)   COPD mixed type (HCC)   PNA (pneumonia)   Mycobacterium avium complex ([place])   Hyponatremia  70 year old male with past medical history of COPD, chronic respiratory failure, history of MAI infection, lung cancer, previous history of hyponatremia presents to the hospital due to weakness, difficulty walking poor by mouth intake and worsening lower extremity edema.   1. Worsening lower extremity edema-etiology unclear presently. Patient has no previous history of congestive heart failure. -Possibly related to chronic venous stasis. get a wound team consult. - echocardiogram to check his ejection fraction,  cardiology consult. Clinically patient does not appear to be in congestive heart failure. -Dopplers of lower extremity done but results are negative for DVT.  2. Hyponatremia- - Given IV fluids, bit now nephrology stopped IV fluids and kept on fluid restriction.   Hold lasix.  3. Acute kidney injury-secondary to dehydration. Give gentle IV fluids, follow BUN and creatinine.  4. MAI infection-continue rifampin, ethambutol, Zithromax - infectious disease consult.  5.  Diarrhea-likely related to the patient's antibiotics. - c diff and Gi panel are negative.   6. Hyperlipidemia - cont. Atorvastatin  7. HTN - bp. On low side.  Hold anti-HTN for now.   8. COPD - no acute exacerbation.  - cont. Dulera, albuterol inhaler.   9. Ac on chronic anemia   Because of dilution likely   Hb 7.2, monitor, Check for [place].   I discussed with his wife, she will be OK if transfusion needed.   Hold heparin [place] for now.  All the records are reviewed and case discussed with Care Management/Social Workerr. Management plans discussed with the patient, family and they are in agreement.  CODE STATUS: full  TOTAL TIME TAKING CARE OF THIS PATIENT: 35 minutes.    POSSIBLE D/C IN 1-2 DAYS, DEPENDING ON CLINICAL CONDITION.   [name] M.D on 07/21/2016   Between 7am to 6pm - Pager - [phone]  After 6pm go to [email or web address] - password [email or web address] [facility]  Office  [phone]  CC: Primary care physician; Asencion Noble, MD  Note: This dictation was prepared with Dragon dictation along with smaller phrase technology. Any transcriptional errors that result from this process are unintentional.

## 2016-07-21 NOTE — Evaluation (Signed)
Physical Therapy Evaluation Patient Details Name: [name] MRN: [id number] DOB: 06-21-1946 Today's Date: 07/21/2016   History of Present Illness  70 y.o. male with a known history of COPD, MAI infection, history of small cell carcinoma of the lung, chronic respiratory failure, previous history of hyponatremia, history of coronary artery disease, previous history of DVT who presents to the hospital due to worsening lower extremity edema, weakness and difficulty walking and poor by mouth intake. Patient was recently discharged from the hospital about 2-3 weeks ago after treatment for a COPD flare.   Clinical Impression  Pt shows good effort and willingness to participate, but after very limited activity he is exhausted a simply was unable to do more than EOB sitting for ~1 minute.  He was very pale (with HGB <7) and reports feeling terrible with even minimal effort. Pt is unsafe to return home given today's performance and overall he and his wife agree that rehab is the most appropriate option on d/c at this time - though he hopes to feel better and do well enough to go home with HHPT.    Follow Up Recommendations SNF (hoping to go home if possible)    Equipment Recommendations       Recommendations for Other Services       Precautions / Restrictions Restrictions Weight Bearing Restrictions: No      Mobility  Bed Mobility Overal bed mobility: Needs Assistance Bed Mobility: Supine to Sit;Sit to Supine     Supine to sit: Mod assist Sit to supine: Mod assist   General bed mobility comments: Pt struggled to get to sitting EOB even with help and was only able to tolerate sitting for ~1 minute before closing eyes, laying back down on bed and reporting that he felt very badly.    Transfers                    Ambulation/Gait                Stairs            Wheelchair Mobility    Modified Rankin (Stroke Patients Only)       Balance Overall balance  assessment: Needs assistance Sitting-balance support: Bilateral upper extremity supported Sitting balance-Leahy Scale: Poor Sitting balance - Comments: pt with very low energy and feeling poorly in sitting - struggles to maintain basic static sitting balance                                     Pertinent Vitals/Pain Pain Assessment: No/denies pain    Home Living Family/patient expects to be discharged to:: Private residence Living Arrangements: Spouse/significant other Available Help at Discharge: Family Type of Home: House Home Access: Stairs to enter Entrance Stairs-Rails: Can reach both Entrance Stairs-Number of Steps: 4   Home Equipment: Environmental consultant - 2 wheels (borrowing from friend) Additional Comments: Pt realizes that he is not safe to go home at this time    Prior Function Level of Independence: Independent         Comments: Pt has been very weak the last 2-3 weeks, prior to that he was able to be active.     Hand Dominance        Extremity/Trunk Assessment   Upper Extremity Assessment: Generalized weakness;Overall [facility] for tasks assessed (Pt grossly weak and with poor energy)  Lower Extremity Assessment: Generalized weakness;Overall WFL for tasks assessed (Pt grossly weak and with poor energy)         Communication   Communication: No difficulties  Cognition Arousal/Alertness: Lethargic Behavior During Therapy: WFL for tasks assessed/performed Overall Cognitive Status: Impaired/Different from baseline (wife reports he is better than yesterday, but still off)                      General Comments General comments (skin integrity, edema, etc.): at time of eval most recent bllod work shows 6.9 HGB - despite this nursing wanted PT to see him     Exercises     Assessment/Plan    PT Assessment Patient needs continued PT services  PT Problem List Decreased strength;Decreased activity tolerance;Decreased balance;Decreased  range of motion;Decreased mobility;Decreased coordination;Decreased cognition;Decreased knowledge of use of DME;Decreased safety awareness          PT Treatment Interventions DME instruction;Gait training;Stair training;Functional mobility training;Therapeutic activities;Therapeutic exercise;Balance training;Patient/family education    PT Goals (Current goals can be found in the Care Plan section)  Acute Rehab PT Goals Patient Stated Goal: Pt wanting to work with PT (home or rehab) but get stronger Potential to Achieve Goals: Fair    Frequency Min 2X/week   Barriers to discharge        Co-evaluation               End of Session Equipment Utilized During Treatment: Oxygen (O2 stayed 90s on RA at rest, dropped to low 90s w/ activity) Activity Tolerance: Patient limited by fatigue;Patient limited by lethargy Patient left: with bed alarm set;with call bell/phone within reach;with family/visitor present           Time: 8242-3536 PT Time Calculation (min) (ACUTE ONLY): 17 min   Charges:   PT Evaluation $PT Eval Low Complexity: 1 Procedure     PT G CodesKreg Shropshire, DPT 07/21/2016, 1:35 PM

## 2016-07-21 NOTE — NC FL2 (Addendum)
[place] LEVEL OF CARE SCREENING TOOL     IDENTIFICATION  Patient Name: [name] Birthdate: 05/03/1946 Sex: male Admission Date (Current Location): 07/20/2016  [place] and [place] Number:  Engineering geologist and Address:  [facility], [address] [place], [place] [address]      Provider Number: [id number]  Attending Physician Name and Address:  [name], MD  Relative Name and Phone Number:       Current Level of Care: Hospital Recommended Level of Care: [place] Prior Approval Number:    Date Approved/Denied:   PASRR Number: [id number]  Discharge Plan: SNF    Current Diagnoses: Patient Active Problem List   Diagnosis Date Noted  . Anemia 07/21/2016  . Hyponatremia 07/20/2016  . COPD exacerbation ([place]) 06/28/2016  . Mycobacterium avium complex ([place]) 06/24/2016  . Bronchiectasis without acute exacerbation ([place]) 05/20/2016  . Lung disease, bullous ([place]) 05/19/2016  . Peripheral edema 12/30/2014  . Encounter for therapeutic drug monitoring 03/15/2014  . Mediastinal adenopathy 01/26/2014  . Tick bite of axillary region 12/26/2013  . Pulmonary embolus ([place]) 12/22/2013  . PNA (pneumonia) 12/22/2013  . Hyperlipidemia 12/26/2012  . NSTEMI (non-ST elevated myocardial infarction) ([place]) 06/03/2012  . HTN (hypertension) 06/03/2012  . Adenocarcinoma of left lung ([place]) 04/30/2008  . DYSPNEA 11/18/2007  . COPD mixed type ([place]) 11/06/2007  . SPONTANEOUS PNEUMOTHORAX 10/31/2007    Orientation RESPIRATION BLADDER Height & Weight     Self, Time, Situation, Place  O2, Normal (3 liters) Continent Weight: 164 lb (74.4 kg) Height:  '5\' 7"'$  (170.2 cm)  BEHAVIORAL SYMPTOMS/MOOD NEUROLOGICAL BOWEL NUTRITION STATUS   (none)  (none) Incontinent Dysphagia 3(chopped) w/ Thin liquids; aspiration precautions; Meds given in Puree for easier swallowing. Tray setup at all meals; support as needed. Pt would benefit  from a drink supplement as per Dietician.   AMBULATORY STATUS COMMUNICATION OF NEEDS Skin   Extensive Assist Verbally Normal                       Personal Care Assistance Level of Assistance  Bathing, Dressing Bathing Assistance: Limited assistance   Dressing Assistance: Limited assistance     Functional Limitations Info   (none)          SPECIAL CARE FACTORS FREQUENCY  PT (By licensed PT)  5x                  Contractures Contractures Info: Not present    Additional Factors Info  Code Status, Allergies Code Status Info: full Allergies Info: codiene           Current Medications (07/21/2016):  This is the current hospital active medication list Current Facility-Administered Medications  Medication Dose Route Frequency Provider Last Rate Last Dose  . acetaminophen (TYLENOL) tablet 650 mg  650 mg Oral Q6H PRN Henreitta Leber, MD       Or  . acetaminophen (TYLENOL) suppository 650 mg  650 mg Rectal Q6H PRN Henreitta Leber, MD      . albuterol (PROVENTIL) (2.5 MG/3ML) 0.083% nebulizer solution 2.5 mg  2.5 mg Nebulization Q6H PRN Henreitta Leber, MD      . aspirin EC tablet 81 mg  81 mg Oral Daily Henreitta Leber, MD   81 mg at 07/21/16 0942  . atorvastatin (LIPITOR) tablet 10 mg  10 mg Oral Daily Henreitta Leber, MD   10 mg at 07/21/16 0942  . azithromycin (ZITHROMAX)  tablet 500 mg  500 mg Oral q1800 Henreitta Leber, MD   500 mg at 07/20/16 1826  . ethambutol (MYAMBUTOL) tablet 1,200 mg  1,200 mg Oral Daily Henreitta Leber, MD   1,200 mg at 07/21/16 0942  . guaiFENesin (MUCINEX) 12 hr tablet 600 mg  600 mg Oral Daily Henreitta Leber, MD   600 mg at 07/21/16 0942  . mometasone-formoterol (DULERA) 100-5 MCG/ACT inhaler 2 puff  2 puff Inhalation BID Henreitta Leber, MD   2 puff at 07/21/16 0940  . ondansetron (ZOFRAN) tablet 4 mg  4 mg Oral Q6H PRN Henreitta Leber, MD       Or  . ondansetron (ZOFRAN) injection 4 mg  4 mg Intravenous Q6H PRN Henreitta Leber, MD       . ondansetron (ZOFRAN) tablet 4 mg  4 mg Oral Q6H PRN Henreitta Leber, MD      . potassium chloride SA (K-DUR,KLOR-CON) CR tablet 40 mEq  40 mEq Oral BID Areta Haber Dunn, PA-C   40 mEq at 07/21/16 0942  . rifampin (RIFADIN) capsule 600 mg  600 mg Oral Daily Henreitta Leber, MD   600 mg at 07/21/16 8250     Discharge Medications: Please see discharge summary for a list of discharge medications.  Relevant Imaging Results:  Relevant Lab Results:   Additional Information ss: [id number]   [facility] referral notified of referral and will follow up with patient at facility.   Shela Leff,

## 2016-07-21 NOTE — Consult Note (Signed)
Cardiology Consultation Note  Patient ID: [name], MRN: [id number], DOB/AGE: 69/04/1946 69 y.o. Admit date: 07/20/2016   Date of Consult: 07/21/2016 Primary Physician: Asencion Noble, MD Primary Cardiologist: Dr. Rockey Situ, MD Requesting Physician: Dr. Verdell Carmine, MD  Chief Complaint: SOB/LE swelling Reason for Consult: Same  HPI: 70 y.o. male with h/o CAD with NSTEMI 04/2012 after choking on a vitamin pill with cardiac cath showing nonobstructive disease, lung cancer, prior DVT/PE treated with Xarelto in 11/2011, pulmonary hypertension. COPD, mycobacteria PNA, hyponatermia, anemia of chronic disease, and spontaneous pneumothorax who presents with increased SOB and LE swelling.   Admission in 2013 for acute respiratory failure in the setting of PNA with clinical sepsis. Found to have DVT/PE. Echo at that time showed normal LV function, moderately elevated RVSP. LE ultrasound showed DVT in the right posterior tibial vein branch. CTA chest showed PE of the right middle lobe and posterior basal right lower lobe pulmonary arteries. He was treated with Xarelto. Patient was admitted as above in 04/2012 for NSTEMI with a peak troponin of 1.5. Cardiac cath showed nonocclusive disease with several regions of 20% stenosis otherwise normal with normal EF and normal echocardiogram. He was recently admitted over the summer for mycobaertium PNA. CT chest shwoed a 7 cm cavitary lesion. He was recently admitted in early October 2017 for AECOPD in the setting of MAC. He was continued on BX therapy. He called our office on 10/10 with SOB and LE swelling. Echo was advised. BNP on 07/07/16 was negative a 40.   He was admitted to the hospital on 10/23 with increased SOB, LE swelling, generalized fatigue, malnutrition, and severe diarrhea with over 10 episodes the day prior. His OSB has been present since his pulmonary infection. He had recently been placed on Lasix by PCP for his symptoms. Despite this therapy his weakness  persisted, though there was some improvement in LE swelling. Because his symptoms persisted he was brought to the ED. In the ED he was noted to be hyponatremic with a sodium of 124, dehydrated with AKI and SCR of 1.59 (baseline of 0.8), BUN 15, K+ 3.5, albumin 3.0, WBC 17.6, HGB 8.8-->6.9 at time of cardiology consult, troponin negative x 1, BNP 42.0. LE ultrasound was negative for DVT. CXR showed stable chronic lung changes with a partially fluid-filled cavitary lesion of the left lung base. He was started on IV fluids for his hyponatremia and AKI. ID was consulted given his chronic lung infection. GI panel is negative given his diarrhea. Echo is pending given his SOB LE swelling. Lasix has been held at time of admission given his hyponatremia and AKI. Hemoccult is pending at this time. He denies any BRBPR, melena, hemetemeiss, or hematuria. No chest pain. Chronic dry cough.     Past Medical History:  Diagnosis Date  . Acute MI   . Arthritis of lumbar spine ([place])   . Asthma   . Clotting disorder ([place])   . Collapsed lung    x3  . COPD (chronic obstructive pulmonary disease) ([place])   . Dyspnea   . History of benign thyroid tumor   . Lung cancer ([place])   . Mycobacterium avium complex ([place]) 06/24/2016  . Pneumonia   . Pneumothorax   . Small cell carcinoma of lung ([place])   . Spontaneous pneumothorax       Most Recent Cardiac Studies: As above   Surgical History:  Past Surgical History:  Procedure Laterality Date  . APPENDECTOMY    . benign thryoid nodule resected    .  CARDIAC CATHETERIZATION  2013   [facility]  . CHEST TUBE INSERTION    . COLONOSCOPY N/A 08/30/2015   Procedure: COLONOSCOPY;  Surgeon: Rogene Houston, MD;  Location: [facility];  Service: Endoscopy;  Laterality: N/A;  1:45  . left upper lobectomy for small cell lung cancer    . VATS R thoracotomy       Home Meds: Prior to Admission medications   Medication Sig Start Date End Date Taking? Authorizing Provider    acetaminophen (TYLENOL) 325 MG tablet Take 650 mg by mouth as needed.     Yes Historical Provider, MD  albuterol (PROVENTIL HFA;VENTOLIN HFA) 108 (90 Base) MCG/ACT inhaler Inhale 2 puffs into the lungs every 6 (six) hours as needed for wheezing or shortness of breath. 03/02/16  Yes Deneise Lever, MD  aspirin 81 MG EC tablet Take 81 mg by mouth daily. Swallow whole.   Yes Historical Provider, MD  atorvastatin (LIPITOR) 10 MG tablet TAKE 1 TABLET BY MOUTH EVERY DAY 06/17/15  Yes Minna Merritts, MD  azithromycin (ZITHROMAX) 500 MG tablet Take 1 tablet (500 mg total) by mouth daily. 06/24/16  Yes Truman Hayward, MD  ethambutol (MYAMBUTOL) 400 MG tablet Take 3 tablets (1,200 mg total) by mouth daily. 06/24/16  Yes Truman Hayward, MD  Fluticasone-Salmeterol (ADVAIR DISKUS) 100-50 MCG/DOSE AEPB Inhale 1 puff into the lungs 2 (two) times daily. 03/30/16  Yes Deneise Lever, MD  furosemide (LASIX) 20 MG tablet Take 20 mg by mouth daily.   Yes Historical Provider, MD  guaiFENesin (MUCINEX) 600 MG 12 hr tablet Take 600 mg by mouth daily.    Yes Historical Provider, MD  losartan (COZAAR) 100 MG tablet Take 100 mg by mouth daily. 11/01/14  Yes Historical Provider, MD  ondansetron (ZOFRAN) 4 MG tablet Take 1 tablet (4 mg total) by mouth every 6 (six) hours as needed for nausea. 06/30/16  Yes [name], MD  rifampin (RIFADIN) 300 MG capsule Take 2 capsules (600 mg total) by mouth daily. Patient taking differently: Take 600 mg by mouth daily. Take on empty stomach 06/24/16  Yes Truman Hayward, MD  traMADol (ULTRAM) 50 MG tablet Takes 2 tablets 4 times daily as needed for pain. 05/25/11  Yes Historical Provider, MD    Inpatient Medications:  . aspirin EC  81 mg Oral Daily  . atorvastatin  10 mg Oral Daily  . azithromycin  500 mg Oral q1800  . ethambutol  1,200 mg Oral Daily  . guaiFENesin  600 mg Oral Daily  . heparin  5,000 Units Subcutaneous Q8H  . mometasone-formoterol  2 puff  Inhalation BID  . rifampin  600 mg Oral Daily   . sodium chloride 50 mL/hr at 07/21/16 8841    Allergies:  Allergies  Allergen Reactions  . Other Other (See Comments)    Hydromet cough syrup-causes GI upset  . Codeine Nausea And Vomiting    Social History   Social History  . Marital status: Married    Spouse name: N/A  . Number of children: 1  . Years of education: N/A   Occupational History  . 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 Smoking status: Former Smoker    Packs/day: 3.00    Years: 30.00    Types: Cigars    Quit date: 03/02/1992  . Smokeless tobacco: Never Used     Comment: quit 22 yrs ago  .  Alcohol use No  . Drug use: No  . Sexual activity: Yes   Other Topics Concern  . Not on file   Social History Narrative  . No narrative on file     Family History  Problem Relation Age of Onset  . Heart disease Mother   . Colon cancer Father   . Heart failure Brother      Review of Systems: Review of Systems  Constitutional: Positive for malaise/fatigue and weight loss. Negative for chills, diaphoresis and fever.  HENT: Negative for congestion.   Eyes: Negative for discharge and redness.  Respiratory: Positive for cough, shortness of breath and wheezing. Negative for hemoptysis and sputum production.   Cardiovascular: Positive for leg swelling. Negative for chest pain, palpitations, orthopnea, claudication and PND.  Gastrointestinal: Negative for abdominal pain, blood in stool, heartburn, melena, nausea and vomiting.  Genitourinary: Negative for hematuria.  Musculoskeletal: Negative for falls and myalgias.  Skin: Negative for rash.  Neurological: Positive for weakness. Negative for dizziness, tingling, tremors, sensory change, speech change, focal weakness and loss of consciousness.  Endo/Heme/Allergies: Does not bruise/bleed easily.  Psychiatric/Behavioral: Negative for substance abuse. The patient is not nervous/anxious.     All other systems reviewed and are negative.   Labs:  Recent Labs  07/20/16 1032  TROPONINI <0.03   Lab Results  Component Value Date   WBC 10.6 07/21/2016   HGB 6.9 (L) 07/21/2016   HCT 20.8 (L) 07/21/2016   MCV 85.7 07/21/2016   PLT 337 07/21/2016     Recent Labs Lab 07/20/16 1032 07/21/16 0522  NA 124* 128*  K 3.5 3.2*  CL 91* 100*  CO2 20* 19*  BUN 15 12  CREATININE 1.59* 1.05  CALCIUM 8.4* 7.5*  PROT 6.5  --   BILITOT 0.5  --   ALKPHOS 48  --   ALT 20  --   AST 28  --   GLUCOSE 127* 95   No results found for: CHOL, HDL, LDLCALC, TRIG No results found for: DDIMER  Radiology/Studies:  Dg Chest 2 View  Result Date: 06/28/2016 CLINICAL DATA:  Cough EXAM: CHEST  2 VIEW COMPARISON:  06/10/2016 FINDINGS: Chronic lung disease including emphysema and scattered bronchiectasis/scarring. Chronic opacity at the left base with fluid level. No progressive airspace opacity, pneumothorax, or pleural effusion. Normal heart size and mediastinal contours. Postoperative left chest. IMPRESSION: 1. Stable compared to prior. 2. Chronic lung disease including emphysema and bronchiectasis with known fluid-filled cavity at the left base. Electronically Signed   By: Monte Fantasia M.D.   On: 06/28/2016 17:05   Ct Angio Chest Pe W And/or Wo Contrast  Result Date: 06/28/2016 CLINICAL DATA:  Increasing shortness of Breath and history of mycobacterial infection pain and previous lung carcinoma EXAM: CT ANGIOGRAPHY CHEST WITH CONTRAST TECHNIQUE: Multidetector CT imaging of the chest was performed using the standard protocol during bolus administration of intravenous contrast. Multiplanar CT image reconstructions and MIPs were obtained to evaluate the vascular anatomy. CONTRAST:  75 mL Isovue 370. COMPARISON:  05/19/2016 FINDINGS: Cardiovascular: Aortic atherosclerotic change is noted without aneurysmal dilatation or dissection. Minimal coronary calcifications are seen. The pulmonary artery  shows a normal branching pattern without filling defects to suggest pulmonary embolism. Mediastinum/Nodes: Left thyroid goiter is noted at the thoracic inlet stable from the prior exam. No discrete nodule is seen. Previously seen AP window node now measures 1.9 cm in short axis increased in size from the prior exam a which time it measured 1.4 cm. No significant  hilar lymphadenopathy is noted. Lungs/Pleura: Chronic emphysematous changes are again identified within both lungs. Chronic scarring in the right base is noted. A small left-sided pleural effusion is seen increased from the prior study. There remains a thickened cavitary lesion in the left lower lobe with air-fluid level within likely representing an infected bulla. Scattered smaller cavitary lesions are noted throughout the left lung with some slight increase in wall thickness when compared with the prior exam. Changes of prior left upper lobectomy are noted. Upper Abdomen: Within normal limits. Musculoskeletal: Degenerative changes of the thoracic spine are again noted. Review of the MIP images confirms the above findings. IMPRESSION: No evidence of pulmonary emboli. Stable left thyroid goiter. Slight enlargement of AP window lymph node from 1.4 to 1.9 cm. Persistent thick walled cavitary lesion in the left lower lobe. Some slight increase in left pleural effusion is noted as well as some increase in the degree of wall thickening of smaller cavitary lesions likely representing some progressive inflammatory change. Electronically Signed   By: Inez Catalina M.D.   On: 06/28/2016 19:37   US Venous Img Lower Bilateral  Result Date: 07/20/2016 CLINICAL DATA:  Bilateral lower extremity swelling for 2 months. History of DVT. EXAM: BILATERAL LOWER EXTREMITY VENOUS DUPLEX ULTRASOUND TECHNIQUE: Doppler venous assessment of the left lower extremity deep venous system was performed, including characterization of spectral flow, compressibility, and phasicity.  COMPARISON:  None. FINDINGS: There is complete compressibility of the bilateral common femoral, femoral, and popliteal veins. Doppler analysis demonstrates respiratory phasicity and augmentation of flow with calf compression. No obvious superficial vein or calf vein thrombosis. IMPRESSION: No evidence of lower extremity DVT. Electronically Signed   By: Marybelle Killings M.D.   On: 07/20/2016 15:27   Dg Chest Portable 1 View  Result Date: 07/20/2016 CLINICAL DATA:  Shortness of breath, weakness. EXAM: PORTABLE CHEST 1 VIEW COMPARISON:  06/28/2016.  CT 06/28/2016. FINDINGS: Again noted is the area fluid-filled cavity at the left lung base with surrounding airspace disease. Chronic density at the right lung base and in the left upper lobe. Appearance is unchanged since prior study. Probable small left effusion. Heart is normal size. IMPRESSION: Stable chronic lung changes including partially fluid-filled cavitary area at the left lung base. Electronically Signed   By: Rolm Baptise M.D.   On: 07/20/2016 11:07    EKG: Interpreted by me showed: sinus tachycardia, 106 bpm, nonspecific st/t changes  Telemetry: Interpreted by me showed: NSR with frequent PVCs and PACs  Weights: Filed Weights   07/20/16 1039 07/20/16 1545  Weight: 164 lb 9.6 oz (74.7 kg) 164 lb (74.4 kg)     Physical Exam: Blood pressure (!) 122/57, pulse 94, temperature 98.5 F (36.9 C), temperature source Oral, resp. rate (!) 24, height '5\' 7"'$  (1.702 m), weight 164 lb (74.4 kg), SpO2 100 %. Body mass index is 25.69 kg/m. General: Well developed, well nourished, in no acute distress. Head: Normocephalic, atraumatic, sclera non-icteric, no xanthomas, nares are without discharge.  Neck: Negative for carotid bruits. JVD not elevated. Lungs: Decreased breath sounds bilaterally. Breathing is unlabored. Heart: RRR with S1 S2. No murmurs, rubs, or gallops appreciated. Abdomen: Soft, non-tender, non-distended with normoactive bowel sounds. No  hepatomegaly. No rebound/guarding. No obvious abdominal masses. Msk:  Strength and tone appear normal for age. Extremities: No clubbing or cyanosis. 2+ pitting edema. Distal pedal pulses are 2+ and equal bilaterally. Neuro: Alert and oriented X 3. No facial asymmetry. No focal deficit. Moves all extremities spontaneously. Psych:  Responds  to questions appropriately with a normal affect.    Assessment and Plan:  Principal Problem:   Anemia Active Problems:   Adenocarcinoma of left lung (HCC)   COPD mixed type (HCC)   PNA (pneumonia)   Mycobacterium avium complex (HCC)   Hyponatremia    1. Acute respiratory distress with hypoxia/SOB/LE swelling: -Likely multifactorial including his acute on chronic anemia with a hgb of 6.9 this morning, MAC with a cavitary lung lesion, likely AECOPD, lung cancer, hypoalbuminemia, malnutrition, and dehydration  -SOB present since his PNA  -BNP is negative -CXR with chronic changes related to his infection -Likely some component of pulmonary hypertension, diuretics on hold given his hypotension and AKI -Await echo results  2. Acute on chronic anemia of chronic disease: -Hemoccult pending -Transfuse to maintain hgb of 8.5 -Per IM -Recommend iron   3. MAC/AECOPD: -Per IM and ID -Likely playing a role in #1  4. Hyponatremia: -Per IM  5. Diarrhea: -GI panel and C diff negative  6. AKI: -Improved  7. Hypokalemia: -Replete to 4.0 -Check magnesium     Signed, Christell Faith, PA-C [facility] Pager: [phone] 07/21/2016, 8:55 AM

## 2016-07-21 NOTE — Consult Note (Signed)
[facility] Infectious Disease     Reason for Consult: MAI    Referring Physician: Bobetta Lime Date of Admission:  07/20/2016   Principal Problem:   Anemia Active Problems:   Adenocarcinoma of left lung (HCC)   COPD mixed type (HCC)   PNA (pneumonia)   Mycobacterium avium complex ([place])   Hyponatremia   HPI: [name] is Steven 70 y.o. male well known to me with MAI on chronic triple therapy admitted with increasing edema, hyponatremia, elevated cr. He continue to take his meds but feels fatigued, chronic nausea with them. Has some loose stools as well. Cough persists. No fevers   Past Medical History:  Diagnosis Date  . Acute MI   . Arthritis of lumbar spine ([place])   . Asthma   . Clotting disorder ([place])   . Collapsed lung    x3  . COPD (chronic obstructive pulmonary disease) ([place])   . Dyspnea   . History of benign thyroid tumor   . Lung cancer ([place])   . Mycobacterium avium complex ([place]) 06/24/2016  . Pneumonia   . Pneumothorax   . Small cell carcinoma of lung ([place])   . Spontaneous pneumothorax    Past Surgical History:  Procedure Laterality Date  . APPENDECTOMY    . benign thryoid nodule resected    . CARDIAC CATHETERIZATION  2013   [facility]  . CHEST TUBE INSERTION    . COLONOSCOPY N/Steven 08/30/2015   Procedure: COLONOSCOPY;  Surgeon: Rogene Houston, MD;  Location: [facility];  Service: Endoscopy;  Laterality: N/Steven;  1:45  . left upper lobectomy for small cell lung cancer    . VATS R thoracotomy     Social History  Substance Use Topics  . Smoking status: Former Smoker    Packs/day: 3.00    Years: 30.00    Types: Cigars    Quit date: 03/02/1992  . Smokeless tobacco: Never Used     Comment: quit 22 yrs ago  . Alcohol use No   Family History  Problem Relation Age of Onset  . Heart disease Mother   . Colon cancer Father   . Heart failure Brother     Allergies:  Allergies  Allergen Reactions  . Other Other (See Comments)    Hydromet cough syrup-causes GI  upset  . Codeine Nausea And Vomiting    Current antibiotics: Antibiotics Given (last 72 hours)    Date/Time Action Medication Dose   07/20/16 1826 Given   azithromycin (ZITHROMAX) tablet 500 mg 500 mg   07/20/16 1827 Given   rifampin (RIFADIN) capsule 600 mg 600 mg   07/21/16 0942 Given   ethambutol (MYAMBUTOL) tablet 1,200 mg 1,200 mg   07/21/16 2585 Given   rifampin (RIFADIN) capsule 600 mg 600 mg      MEDICATIONS: . aspirin EC  81 mg Oral Daily  . atorvastatin  10 mg Oral Daily  . azithromycin  500 mg Oral q1800  . ethambutol  1,200 mg Oral Daily  . guaiFENesin  600 mg Oral Daily  . mometasone-formoterol  2 puff Inhalation BID  . potassium chloride  40 mEq Oral BID  . rifampin  600 mg Oral Daily    Review of Systems - 11 systems reviewed and negative per HPI   OBJECTIVE: Temp:  [98.3 F (36.8 C)-99.8 F (37.7 C)] 98.7 F (37.1 C) (10/24 1116) Pulse Rate:  [92-103] 98 (10/24 1116) Resp:  [18-24] 18 (10/24 1116) BP: (103-122)/(41-57) 112/41 (10/24 1116) SpO2:  [93 %-  100 %] 100 % (10/24 1116) Weight:  [74.4 kg (164 lb)] 74.4 kg (164 lb) (10/23 1545) Physical Exam  Constitutional: He is oriented to person, place, and time. Frail HENT: anicteric Mouth/Throat: Oropharynx is clear and dry . No oropharyngeal exudate.  Cardiovascular: Normal rate, regular rhythm and normal heart sounds. E Pulmonary/Chest: poor air movement, on O2 Abdominal: Soft. Bowel sounds are normal. He exhibits no distension. There is no tenderness.  Lymphadenopathy:  He has no cervical adenopathy.  Neurological: He is alert and oriented to person, place, and time.  Skin: bil LE wrapped in unnawrap   LABS: Results for orders placed or performed during the hospital encounter of 07/20/16 (from the past 48 hour(s))  CBC     Status: Abnormal   Collection Time: 07/20/16 10:32 AM  Result Value Ref Range   WBC 17.6 (H) 3.8 - 10.6 K/uL   RBC 3.09 (L) 4.40 - 5.90 MIL/uL   Hemoglobin 8.8 (L) 13.0 -  18.0 g/dL   HCT 26.1 (L) 40.0 - 52.0 %   MCV 84.4 80.0 - 100.0 fL   MCH 28.6 26.0 - 34.0 pg   MCHC 33.9 32.0 - 36.0 g/dL   RDW 15.9 (H) 11.5 - 14.5 %   Platelets 462 (H) 150 - 440 K/uL  Comprehensive metabolic panel     Status: Abnormal   Collection Time: 07/20/16 10:32 AM  Result Value Ref Range   Sodium 124 (L) 135 - 145 mmol/L   Potassium 3.5 3.5 - 5.1 mmol/L   Chloride 91 (L) 101 - 111 mmol/L   CO2 20 (L) 22 - 32 mmol/L   Glucose, Bld 127 (H) 65 - 99 mg/dL   BUN 15 6 - 20 mg/dL   Creatinine, Ser 1.59 (H) 0.61 - 1.24 mg/dL   Calcium 8.4 (L) 8.9 - 10.3 mg/dL   Total Protein 6.5 6.5 - 8.1 g/dL   Albumin 3.0 (L) 3.5 - 5.0 g/dL   AST 28 15 - 41 U/L   ALT 20 17 - 63 U/L   Alkaline Phosphatase 48 38 - 126 U/L   Total Bilirubin 0.5 0.3 - 1.2 mg/dL   GFR calc non Af Amer 43 (L) >60 mL/min   GFR calc Af Amer 49 (L) >60 mL/min    Comment: (NOTE) The eGFR has been calculated using the CKD EPI equation. This calculation has not been validated in all clinical situations. eGFR's persistently <60 mL/min signify possible Chronic Kidney Disease.    Anion gap 13 5 - 15  Troponin I     Status: None   Collection Time: 07/20/16 10:32 AM  Result Value Ref Range   Troponin I <0.03 <0.03 ng/mL  Brain natriuretic peptide     Status: None   Collection Time: 07/20/16 10:32 AM  Result Value Ref Range   B Natriuretic Peptide 42.0 0.0 - 100.0 pg/mL  Gastrointestinal Panel by PCR , Stool     Status: None   Collection Time: 07/20/16  6:40 PM  Result Value Ref Range   Campylobacter species NOT DETECTED NOT DETECTED   Plesimonas shigelloides NOT DETECTED NOT DETECTED   Salmonella species NOT DETECTED NOT DETECTED   Yersinia enterocolitica NOT DETECTED NOT DETECTED   Vibrio species NOT DETECTED NOT DETECTED   Vibrio cholerae NOT DETECTED NOT DETECTED   Enteroaggregative E coli (EAEC) NOT DETECTED NOT DETECTED   Enteropathogenic E coli (EPEC) NOT DETECTED NOT DETECTED   Enterotoxigenic E coli  (ETEC) NOT DETECTED NOT DETECTED   Shiga like toxin  producing E coli (STEC) NOT DETECTED NOT DETECTED   Shigella/Enteroinvasive E coli (EIEC) NOT DETECTED NOT DETECTED   Cryptosporidium NOT DETECTED NOT DETECTED   Cyclospora cayetanensis NOT DETECTED NOT DETECTED   Entamoeba histolytica NOT DETECTED NOT DETECTED   Giardia lamblia NOT DETECTED NOT DETECTED   Adenovirus F40/41 NOT DETECTED NOT DETECTED   Astrovirus NOT DETECTED NOT DETECTED   Norovirus GI/GII NOT DETECTED NOT DETECTED   Rotavirus Steven NOT DETECTED NOT DETECTED   Sapovirus (I, II, IV, and V) NOT DETECTED NOT DETECTED  C difficile quick scan w PCR reflex     Status: None   Collection Time: 07/20/16  6:40 PM  Result Value Ref Range   C Diff antigen NEGATIVE NEGATIVE   C Diff toxin NEGATIVE NEGATIVE   C Diff interpretation No C. difficile detected.   Glucose, capillary     Status: Abnormal   Collection Time: 07/21/16 12:28 AM  Result Value Ref Range   Glucose-Capillary 105 (H) 65 - 99 mg/dL  Basic metabolic panel     Status: Abnormal   Collection Time: 07/21/16  5:22 AM  Result Value Ref Range   Sodium 128 (L) 135 - 145 mmol/L   Potassium 3.2 (L) 3.5 - 5.1 mmol/L   Chloride 100 (L) 101 - 111 mmol/L   CO2 19 (L) 22 - 32 mmol/L   Glucose, Bld 95 65 - 99 mg/dL   BUN 12 6 - 20 mg/dL   Creatinine, Ser 1.05 0.61 - 1.24 mg/dL   Calcium 7.5 (L) 8.9 - 10.3 mg/dL   GFR calc non Af Amer >60 >60 mL/min   GFR calc Af Amer >60 >60 mL/min    Comment: (NOTE) The eGFR has been calculated using the CKD EPI equation. This calculation has not been validated in all clinical situations. eGFR's persistently <60 mL/min signify possible Chronic Kidney Disease.    Anion gap 9 5 - 15  CBC     Status: Abnormal   Collection Time: 07/21/16  5:22 AM  Result Value Ref Range   WBC 10.6 3.8 - 10.6 K/uL   RBC 2.42 (L) 4.40 - 5.90 MIL/uL   Hemoglobin 6.9 (L) 13.0 - 18.0 g/dL   HCT 20.8 (L) 40.0 - 52.0 %   MCV 85.7 80.0 - 100.0 fL   MCH 28.4  26.0 - 34.0 pg   MCHC 33.2 32.0 - 36.0 g/dL   RDW 15.8 (H) 11.5 - 14.5 %   Platelets 337 150 - 440 K/uL  Osmolality     Status: Abnormal   Collection Time: 07/21/16 10:27 AM  Result Value Ref Range   Osmolality 263 (L) 275 - 295 mOsm/kg  CBC     Status: Abnormal   Collection Time: 07/21/16 11:30 AM  Result Value Ref Range   WBC 11.3 (H) 3.8 - 10.6 K/uL   RBC 2.54 (L) 4.40 - 5.90 MIL/uL   Hemoglobin 7.2 (L) 13.0 - 18.0 g/dL   HCT 21.4 (L) 40.0 - 52.0 %   MCV 84.1 80.0 - 100.0 fL   MCH 28.3 26.0 - 34.0 pg   MCHC 33.7 32.0 - 36.0 g/dL   RDW 15.7 (H) 11.5 - 14.5 %   Platelets 345 150 - 440 K/uL   No components found for: ESR, C REACTIVE PROTEIN MICRO: Recent Results (from the past 720 hour(s))  Gastrointestinal Panel by PCR , Stool     Status: None   Collection Time: 07/20/16  6:40 PM  Result Value Ref Range Status  Campylobacter species NOT DETECTED NOT DETECTED Final   Plesimonas shigelloides NOT DETECTED NOT DETECTED Final   Salmonella species NOT DETECTED NOT DETECTED Final   Yersinia enterocolitica NOT DETECTED NOT DETECTED Final   Vibrio species NOT DETECTED NOT DETECTED Final   Vibrio cholerae NOT DETECTED NOT DETECTED Final   Enteroaggregative E coli (EAEC) NOT DETECTED NOT DETECTED Final   Enteropathogenic E coli (EPEC) NOT DETECTED NOT DETECTED Final   Enterotoxigenic E coli (ETEC) NOT DETECTED NOT DETECTED Final   Shiga like toxin producing E coli (STEC) NOT DETECTED NOT DETECTED Final   Shigella/Enteroinvasive E coli (EIEC) NOT DETECTED NOT DETECTED Final   Cryptosporidium NOT DETECTED NOT DETECTED Final   Cyclospora cayetanensis NOT DETECTED NOT DETECTED Final   Entamoeba histolytica NOT DETECTED NOT DETECTED Final   Giardia lamblia NOT DETECTED NOT DETECTED Final   Adenovirus F40/41 NOT DETECTED NOT DETECTED Final   Astrovirus NOT DETECTED NOT DETECTED Final   Norovirus GI/GII NOT DETECTED NOT DETECTED Final   Rotavirus Steven NOT DETECTED NOT DETECTED Final    Sapovirus (I, II, IV, and V) NOT DETECTED NOT DETECTED Final  C difficile quick scan w PCR reflex     Status: None   Collection Time: 07/20/16  6:40 PM  Result Value Ref Range Status   C Diff antigen NEGATIVE NEGATIVE Final   C Diff toxin NEGATIVE NEGATIVE Final   C Diff interpretation No C. difficile detected.  Final    IMAGING: Dg Chest 2 View  Result Date: 06/28/2016 CLINICAL DATA:  Cough EXAM: CHEST  2 VIEW COMPARISON:  06/10/2016 FINDINGS: Chronic lung disease including emphysema and scattered bronchiectasis/scarring. Chronic opacity at the left base with fluid level. No progressive airspace opacity, pneumothorax, or pleural effusion. Normal heart size and mediastinal contours. Postoperative left chest. IMPRESSION: 1. Stable compared to prior. 2. Chronic lung disease including emphysema and bronchiectasis with known fluid-filled cavity at the left base. Electronically Signed   By: Monte Fantasia M.D.   On: 06/28/2016 17:05   Ct Angio Chest Pe W And/or Wo Contrast  Result Date: 06/28/2016 CLINICAL DATA:  Increasing shortness of Breath and history of mycobacterial infection pain and previous lung carcinoma EXAM: CT ANGIOGRAPHY CHEST WITH CONTRAST TECHNIQUE: Multidetector CT imaging of the chest was performed using the standard protocol during bolus administration of intravenous contrast. Multiplanar CT image reconstructions and MIPs were obtained to evaluate the vascular anatomy. CONTRAST:  75 mL Isovue 370. COMPARISON:  05/19/2016 FINDINGS: Cardiovascular: Aortic atherosclerotic change is noted without aneurysmal dilatation or dissection. Minimal coronary calcifications are seen. The pulmonary artery shows Steven normal branching pattern without filling defects to suggest pulmonary embolism. Mediastinum/Nodes: Left thyroid goiter is noted at the thoracic inlet stable from the prior exam. No discrete nodule is seen. Previously seen AP window node now measures 1.9 cm in short axis increased in size  from the prior exam Steven which time it measured 1.4 cm. No significant hilar lymphadenopathy is noted. Lungs/Pleura: Chronic emphysematous changes are again identified within both lungs. Chronic scarring in the right base is noted. Steven small left-sided pleural effusion is seen increased from the prior study. There remains Steven thickened cavitary lesion in the left lower lobe with air-fluid level within likely representing an infected bulla. Scattered smaller cavitary lesions are noted throughout the left lung with some slight increase in wall thickness when compared with the prior exam. Changes of prior left upper lobectomy are noted. Upper Abdomen: Within normal limits. Musculoskeletal: Degenerative changes of the thoracic spine  are again noted. Review of the MIP images confirms the above findings. IMPRESSION: No evidence of pulmonary emboli. Stable left thyroid goiter. Slight enlargement of AP window lymph node from 1.4 to 1.9 cm. Persistent thick walled cavitary lesion in the left lower lobe. Some slight increase in left pleural effusion is noted as well as some increase in the degree of wall thickening of smaller cavitary lesions likely representing some progressive inflammatory change. Electronically Signed   By: Inez Catalina M.D.   On: 06/28/2016 19:37   US Venous Img Lower Bilateral  Result Date: 07/20/2016 CLINICAL DATA:  Bilateral lower extremity swelling for 2 months. History of DVT. EXAM: BILATERAL LOWER EXTREMITY VENOUS DUPLEX ULTRASOUND TECHNIQUE: Doppler venous assessment of the left lower extremity deep venous system was performed, including characterization of spectral flow, compressibility, and phasicity. COMPARISON:  None. FINDINGS: There is complete compressibility of the bilateral common femoral, femoral, and popliteal veins. Doppler analysis demonstrates respiratory phasicity and augmentation of flow with calf compression. No obvious superficial vein or calf vein thrombosis. IMPRESSION: No evidence  of lower extremity DVT. Electronically Signed   By: Marybelle Killings M.D.   On: 07/20/2016 15:27   Dg Chest Portable 1 View  Result Date: 07/20/2016 CLINICAL DATA:  Shortness of breath, weakness. EXAM: PORTABLE CHEST 1 VIEW COMPARISON:  06/28/2016.  CT 06/28/2016. FINDINGS: Again noted is the area fluid-filled cavity at the left lung base with surrounding airspace disease. Chronic density at the right lung base and in the left upper lobe. Appearance is unchanged since prior study. Probable small left effusion. Heart is normal size. IMPRESSION: Stable chronic lung changes including partially fluid-filled cavitary area at the left lung base. Electronically Signed   By: Rolm Baptise M.D.   On: 07/20/2016 11:07    Assessment:   [name] is Steven 70 y.o. male with chronic MAI, on treatment for about Steven month (rif,, etham and azithro). He also has advance COPD and follows with [facility]. He had been seen 10/10 with increasing edema- I had him stop chlorthalidone and start lasix and he was to fu cards and get echo. Admitted now with worsening edema, increased cr and chronic hyponatremia.   Overall he appears weaker and has some chronic nausea with the triple abx.   Recommendations Would recommoned holding the triple therapy for about 10 days This may give him time to recover his strength as treatment for MAI can be difficult to tolerate.  I can see in 10 days in clinic to review restarting  Thank you very much for allowing me to participate in the care of this patient. Please call with questions.   Cheral Marker. Ola Spurr, MD

## 2016-07-21 NOTE — Consult Note (Signed)
[facility] Nurse wound consult note Reason for Consult: Bilateral Unnas boots application. Erythema and edema to bilateral lower legs and feet Wound type:Chronic venous insufficiency Pressure Ulcer POA: N/A Measurement:Generalized edema and erythema to bilateral lower legs and feet Wound bed:N/A Drainage (amount, consistency, odor) Scant weeping Periwound:Erythema Dressing procedure/placement/frequency:Cleanse bilateral lower legs with soap and water.  Zinc layer from below toes to below knee.  Secure with self adherent Coban layer.  Change weekly.  Will not follow at this time.  Please re-consult if needed.  Domenic Moras RN BSN [place] Pager [phone]

## 2016-07-21 NOTE — Clinical Social Work Note (Signed)
Clinical Social Work Assessment  Patient Details  Name: [name] MRN: [id number] Date of Birth: Sep 17, 1946  Date of referral:  07/21/16               Reason for consult:  Facility Placement                Permission sought to share information with:  Chartered certified accountant granted to share information::  Yes, Verbal Permission Granted  Name::        Agency::     Relationship::     Contact Information:     Housing/Transportation Living arrangements for the past 2 months:  Single Family Home Source of Information:  Patient Patient Interpreter Needed:  None Criminal Activity/Legal Involvement Pertinent to Current Situation/Hospitalization:  No - Comment as needed Significant Relationships:  Spouse Lives with:  Spouse Do you feel safe going back to the place where you live?  Yes Need for family participation in patient care:  Yes (Comment)  Care giving concerns:  Patient currently is unable to ambulate but is able to at baseline.   Social Worker assessment / plan:  CSW reviewed PT recommendation for STR. CSW spoke with patient who was resting in a fetal position and kept his eyes closed while speaking with CSW. Patient was pleasant but somewhat restless. Patient stated that he and his wife were in agreement with the STR recommendation. Patient stated that he does not want to go to [facility] because he has friends that have gone there and not liked it. CSW initiated a bedsearch.   Employment status:  Retired Forensic scientist:  Medicare PT Recommendations:  [place] / Referral to community resources:     Patient/Family's Response to care:  Patient expressed appreciation for CSW assistance.  Patient/Family's Understanding of and Emotional Response to Diagnosis, Current Treatment, and Prognosis:  Patient currently is aware that he is very weak and would benefit from STR. Patient is hopeful that he will show marked  improvement prior to discharge and will be able to return home.   Emotional Assessment Appearance:  Appears stated age Attitude/Demeanor/Rapport:   (pleasant and cooperative) Affect (typically observed):  Accepting, Adaptable, Calm, Withdrawn Orientation:  Oriented to Self, Oriented to Place, Oriented to  Time, Oriented to Situation Alcohol / Substance use:  Not Applicable Psych involvement (Current and /or in the community):  No (Comment)  Discharge Needs  Concerns to be addressed:  Care Coordination Readmission within the last 30 days:  No Current discharge risk:  None Barriers to Discharge:  No Barriers Identified   Shela Leff, LCSW 07/21/2016, 2:25 PM

## 2016-07-22 ENCOUNTER — Inpatient Hospital Stay: Payer: Medicare Other

## 2016-07-22 DIAGNOSIS — J9600 Acute respiratory failure, unspecified whether with hypoxia or hypercapnia: Secondary | ICD-10-CM | POA: Diagnosis present

## 2016-07-22 DIAGNOSIS — J441 Chronic obstructive pulmonary disease with (acute) exacerbation: Secondary | ICD-10-CM

## 2016-07-22 DIAGNOSIS — J9620 Acute and chronic respiratory failure, unspecified whether with hypoxia or hypercapnia: Secondary | ICD-10-CM

## 2016-07-22 DIAGNOSIS — R0789 Other chest pain: Secondary | ICD-10-CM

## 2016-07-22 LAB — URINALYSIS COMPLETE WITH MICROSCOPIC (ARMC ONLY)
BILIRUBIN URINE: NEGATIVE
Bacteria, UA: NONE SEEN
GLUCOSE, UA: NEGATIVE mg/dL
LEUKOCYTES UA: NEGATIVE
Nitrite: NEGATIVE
Protein, ur: 30 mg/dL — AB
SPECIFIC GRAVITY, URINE: 1.011 (ref 1.005–1.030)
pH: 5 (ref 5.0–8.0)

## 2016-07-22 LAB — PROTIME-INR
INR: 1.11
Prothrombin Time: 14.3 seconds (ref 11.4–15.2)

## 2016-07-22 LAB — CBC
HEMATOCRIT: 23.9 % — AB (ref 40.0–52.0)
Hemoglobin: 7.8 g/dL — ABNORMAL LOW (ref 13.0–18.0)
MCH: 28.5 pg (ref 26.0–34.0)
MCHC: 32.9 g/dL (ref 32.0–36.0)
MCV: 86.7 fL (ref 80.0–100.0)
PLATELETS: 354 10*3/uL (ref 150–440)
RBC: 2.75 MIL/uL — AB (ref 4.40–5.90)
RDW: 15.8 % — ABNORMAL HIGH (ref 11.5–14.5)
WBC: 10.8 10*3/uL — AB (ref 3.8–10.6)

## 2016-07-22 LAB — BASIC METABOLIC PANEL
ANION GAP: 8 (ref 5–15)
BUN: 7 mg/dL (ref 6–20)
CO2: 18 mmol/L — ABNORMAL LOW (ref 22–32)
Calcium: 8.2 mg/dL — ABNORMAL LOW (ref 8.9–10.3)
Chloride: 105 mmol/L (ref 101–111)
Creatinine, Ser: 0.79 mg/dL (ref 0.61–1.24)
GFR calc Af Amer: >60 mL/min (ref >60)
GLUCOSE: 88 mg/dL (ref 65–99)
POTASSIUM: 4 mmol/L (ref 3.5–5.1)
Sodium: 131 mmol/L — ABNORMAL LOW (ref 135–145)

## 2016-07-22 LAB — TROPONIN I
TROPONIN I: 0.11 ng/mL — AB (ref <0.03)
Troponin I: 0.07 ng/mL (ref <0.03)

## 2016-07-22 LAB — OSMOLALITY, URINE: Osmolality, Ur: 403 mOsm/kg (ref 300–900)

## 2016-07-22 LAB — APTT: APTT: 34 s (ref 24–36)

## 2016-07-22 LAB — GLUCOSE, CAPILLARY: GLUCOSE-CAPILLARY: 108 mg/dL — AB (ref 65–99)

## 2016-07-22 LAB — SODIUM, URINE, RANDOM: Sodium, Ur: 64 mmol/L

## 2016-07-22 LAB — MRSA PCR SCREENING: MRSA BY PCR: NEGATIVE

## 2016-07-22 MED ORDER — ENSURE ENLIVE PO LIQD
237.0000 mL | Freq: Two times a day (BID) | ORAL | Status: DC
  Administered 2016-07-23 – 2016-07-29 (×8): 237 mL via ORAL

## 2016-07-22 MED ORDER — CEFTRIAXONE SODIUM-DEXTROSE 1-3.74 GM-% IV SOLR
1.0000 g | INTRAVENOUS | Status: DC
  Administered 2016-07-22 – 2016-07-24 (×3): 1 g via INTRAVENOUS
  Filled 2016-07-22 (×3): qty 50

## 2016-07-22 MED ORDER — LORAZEPAM 2 MG/ML IJ SOLN
1.0000 mg | INTRAMUSCULAR | Status: DC | PRN
  Administered 2016-07-23 – 2016-07-24 (×4): 1 mg via INTRAVENOUS
  Filled 2016-07-22 (×4): qty 1

## 2016-07-22 MED ORDER — HALOPERIDOL LACTATE 5 MG/ML IJ SOLN
2.5000 mg | Freq: Once | INTRAMUSCULAR | Status: AC
  Administered 2016-07-22: 2.5 mg via INTRAVENOUS
  Filled 2016-07-22 (×2): qty 1

## 2016-07-22 MED ORDER — IPRATROPIUM-ALBUTEROL 0.5-2.5 (3) MG/3ML IN SOLN
3.0000 mL | Freq: Four times a day (QID) | RESPIRATORY_TRACT | Status: DC
  Administered 2016-07-22 – 2016-07-24 (×8): 3 mL via RESPIRATORY_TRACT
  Filled 2016-07-22 (×9): qty 3

## 2016-07-22 MED ORDER — HEPARIN (PORCINE) IN NACL 100-0.45 UNIT/ML-% IJ SOLN
12.0000 [IU]/kg/h | INTRAMUSCULAR | Status: DC
  Administered 2016-07-22: 12 [IU]/kg/h via INTRAVENOUS
  Filled 2016-07-22: qty 250

## 2016-07-22 MED ORDER — FAMOTIDINE IN NACL 20-0.9 MG/50ML-% IV SOLN
20.0000 mg | Freq: Two times a day (BID) | INTRAVENOUS | Status: DC
  Administered 2016-07-22 – 2016-07-23 (×2): 20 mg via INTRAVENOUS
  Filled 2016-07-22 (×2): qty 50

## 2016-07-22 MED ORDER — HALOPERIDOL LACTATE 5 MG/ML IJ SOLN
2.0000 mg | Freq: Four times a day (QID) | INTRAMUSCULAR | Status: DC | PRN
  Administered 2016-07-22: 2 mg via INTRAVENOUS
  Filled 2016-07-22: qty 1

## 2016-07-22 MED ORDER — DEXTROSE 5 % IV SOLN: 1.0000 g | INTRAVENOUS | Status: DC

## 2016-07-22 MED ORDER — BUDESONIDE 0.25 MG/2ML IN SUSP
0.2500 mg | Freq: Four times a day (QID) | RESPIRATORY_TRACT | Status: DC
  Administered 2016-07-23 – 2016-07-29 (×25): 0.25 mg via RESPIRATORY_TRACT
  Filled 2016-07-22 (×26): qty 2

## 2016-07-22 MED ORDER — ALBUTEROL SULFATE (2.5 MG/3ML) 0.083% IN NEBU: 2.5000 mg | INHALATION_SOLUTION | RESPIRATORY_TRACT | Status: DC | PRN

## 2016-07-22 MED ORDER — ALBUTEROL SULFATE (2.5 MG/3ML) 0.083% IN NEBU
2.5000 mg | INHALATION_SOLUTION | RESPIRATORY_TRACT | Status: DC | PRN
  Administered 2016-07-22: 2.5 mg via RESPIRATORY_TRACT
  Filled 2016-07-22: qty 3

## 2016-07-22 MED ORDER — BUDESONIDE 0.25 MG/2ML IN SUSP
0.2500 mg | Freq: Four times a day (QID) | RESPIRATORY_TRACT | Status: DC
  Administered 2016-07-22 (×2): 0.25 mg via RESPIRATORY_TRACT
  Filled 2016-07-22 (×2): qty 2

## 2016-07-22 MED ORDER — NITROGLYCERIN 2 % TD OINT
1.0000 [in_us] | TOPICAL_OINTMENT | Freq: Four times a day (QID) | TRANSDERMAL | Status: DC
  Administered 2016-07-22 – 2016-07-23 (×3): 1 [in_us] via TOPICAL
  Filled 2016-07-22 (×3): qty 1

## 2016-07-22 MED ORDER — METOPROLOL TARTRATE 5 MG/5ML IV SOLN
2.5000 mg | Freq: Four times a day (QID) | INTRAVENOUS | Status: DC
  Administered 2016-07-22 – 2016-07-24 (×7): 2.5 mg via INTRAVENOUS
  Filled 2016-07-22 (×7): qty 5

## 2016-07-22 NOTE — Progress Notes (Signed)
ANTICOAGULATION CONSULT NOTE - Initial Consult  Pharmacy Consult for Heparin3 Indication: chest pain/ACS  Allergies  Allergen Reactions  . Other Other (See Comments)    Hydromet cough syrup-causes GI upset  . Codeine Nausea And Vomiting    Patient Measurements: Height: '5\' 7"'$  (170.2 cm) Weight: 164 lb (74.4 kg) IBW/kg (Calculated) : 66.1 Heparin Dosing Weight: 74.4 kg  Vital Signs: Temp: 98.4 F (36.9 C) (10/25 1424) Temp Source: Oral (10/25 1424) BP: 151/79 (10/25 1424) Pulse Rate: 105 (10/25 1424)  Labs:  Recent Labs  07/20/16 1032 07/21/16 0522 07/21/16 1130 07/22/16 0523  HGB 8.8* 6.9* 7.2* 7.8*  HCT 26.1* 20.8* 21.4* 23.9*  PLT 462* 337 345 354  CREATININE 1.59* 1.05  --  0.79  TROPONINI <0.03  --   --   --     Estimated Creatinine Clearance: 81.5 mL/min (by C-G formula based on SCr of 0.79 mg/dL).   Medical History: Past Medical History:  Diagnosis Date  . Acute MI   . Arthritis of lumbar spine ([place])   . Asthma   . Clotting disorder ([place])   . Collapsed lung    x3  . COPD (chronic obstructive pulmonary disease) ([place])   . Dyspnea   . History of benign thyroid tumor   . Lung cancer ([place])   . Mycobacterium avium complex ([place]) 06/24/2016  . Pneumonia   . Pneumothorax   . Small cell carcinoma of lung ([place])   . Spontaneous pneumothorax     Assessment: 70 y/o M with a h/o COPD, lung CA, hyponatremia, CAD, DVT, MAI, MI, and anemia admitted with hypotension. Patient developing chest pain to begin heparin infusion.   Goal of Therapy:  Heparin level 0.3-0.7 units/ml Monitor platelets by anticoagulation protocol: Yes   Plan:  Start heparin infusion at 900 units/hr without bolus due to anemia per CCM.  Check anti-Xa level in 6 hours and daily while on heparin Continue to monitor H&H and platelets  Ulice Dash D 07/22/2016,2:46 PM

## 2016-07-22 NOTE — Progress Notes (Signed)
[place] [place] NAME: [name]    MR#:  [id number]  DATE OF BIRTH:  16-Dec-1945  SUBJECTIVE:  CHIEF COMPLAINT:   Chief Complaint  Patient presents with  . Hypotension     Have MAI infection with cavity in lungs, and had leg swellings so started on lasix, now weak for last few days, also have c/o swellowing difficulty.   Today patient is an extremely respiratory distress with respiratory rate of 30-40 and using accessory muscles of respiration.  REVIEW OF SYSTEMS:  CONSTITUTIONAL: No fever,positive for fatigue or weakness.  EYES: No blurred or double vision.  EARS, NOSE, AND THROAT: No tinnitus or ear pain.  RESPIRATORY: Positive cough,Severe shortness of breath, wheezing , no hemoptysis.  CARDIOVASCULAR: No chest pain, orthopnea,positive for edema.  GASTROINTESTINAL: No nausea, vomiting, diarrhea or abdominal pain.  GENITOURINARY: No dysuria, hematuria.  ENDOCRINE: No polyuria, nocturia,  HEMATOLOGY: No anemia, easy bruising or bleeding SKIN: No rash or lesion. MUSCULOSKELETAL: No joint pain or arthritis.   NEUROLOGIC: No tingling, numbness, weakness.  PSYCHIATRY: No anxiety or depression.   ROS  DRUG ALLERGIES:   Allergies  Allergen Reactions  . Other Other (See Comments)    Hydromet cough syrup-causes GI upset  . Codeine Nausea And Vomiting    VITALS:  Blood pressure (!) 151/79, pulse (!) 105, temperature 98.4 F (36.9 C), temperature source Oral, resp. rate (!) 38, height '5\' 7"'$  (1.702 m), weight 74.4 kg (164 lb), SpO2 98 %.  PHYSICAL EXAMINATION:  GENERAL:  70 y.o.-year-old patient lying in the bed with  acute distress.  EYES: Pupils equal, round, reactive to light and accommodation. No scleral icterus. Extraocular muscles intact. Conjunctiva pale. HEENT: Head atraumatic, normocephalic. Oropharynx and nasopharynx clear.  NECK:  Supple, no jugular venous distention. No thyroid enlargement, no tenderness.  LUNGS:  Normal breath sounds bilaterally, Positive expiratory wheezing,  crepitation. Bilateral extensive use of accessory muscles of respiration. Respiratory rate up to 30/m CARDIOVASCULAR: S1, S2 normal. No murmurs, rubs, or gallops.  ABDOMEN: Soft, nontender, nondistended. Bowel sounds present. No organomegaly or mass.  EXTREMITIES: positive for pedal edema,no cyanosis, or clubbing. Some redness on left leg. NEUROLOGIC: Cranial nerves II through XII are intact. Muscle strength 4/5 in all extremities. Sensation intact. Gait not checked.  PSYCHIATRIC: The patient is alert and oriented x 2. Distressed.  SKIN: No obvious rash, lesion, or ulcer.   Physical Exam LABORATORY PANEL:   CBC  Recent Labs Lab 07/22/16 0523  WBC 10.8*  HGB 7.8*  HCT 23.9*  PLT 354   ------------------------------------------------------------------------------------------------------------------  Chemistries   Recent Labs Lab 07/20/16 1032  07/22/16 0523  NA 124*  < > 131*  K 3.5  < > 4.0  CL 91*  < > 105  CO2 20*  < > 18*  GLUCOSE 127*  < > 88  BUN 15  < > 7  CREATININE 1.59*  < > 0.79  CALCIUM 8.4*  < > 8.2*  AST 28  --   --   ALT 20  --   --   ALKPHOS 48  --   --   BILITOT 0.5  --   --   < > = values in this interval not displayed. ------------------------------------------------------------------------------------------------------------------  Cardiac Enzymes  Recent Labs Lab 07/20/16 1032 07/22/16 1443  TROPONINI <0.03 0.11*   ------------------------------------------------------------------------------------------------------------------  RADIOLOGY:  Dg Chest 2 View  Result Date: 07/22/2016 CLINICAL DATA:  Pneumonia.  Shortness of breath, weakness. EXAM: CHEST  2 VIEW COMPARISON:  07/20/2016 FINDINGS: Chronic changes are again noted throughout the left lung with cavitary area at the left lung base. This is stable when compared to prior study. Underlying COPD. Biapical scarring. No  confluent opacities to the right. Heart is borderline in size. IMPRESSION: Stable COPD. Stable chronic changes throughout the left lung including cavitary area at the left lung base. No acute findings or change. Electronically Signed   By: Rolm Baptise M.D.   On: 07/22/2016 09:52    ASSESSMENT AND PLAN:   Principal Problem:   Anemia Active Problems:   Adenocarcinoma of left lung (HCC)   COPD mixed type (HCC)   PNA (pneumonia)   Mycobacterium avium complex ([place])   Hyponatremia   General weakness  70 year old male with past medical history of COPD, chronic respiratory failure, history of MAI infection, lung cancer, previous history of hyponatremia presents to the hospital due to weakness, difficulty walking poor by mouth intake and worsening lower extremity edema.   1. Worsening lower extremity edema-etiology unclear presently. Patient has no previous history of congestive heart failure. -Possibly related to chronic venous stasis. get a wound team consult. - echocardiogram to check his ejection fraction,  cardiology consult. Clinically patient does not appear to be in congestive heart failure. -Dopplers of lower extremity done but results are negative for DVT. - There is some redness on his legs I spoke to ID about that and he may need antibiotic.  2. Acute respiratory distress   This is secondary to his pneumonia and severe baseline emphysema.   IV steroid and nebulizer therapy.   Transfer him to stepdown unit, I discussed with the pulmonary specialist.  2. Hyponatremia- - Given IV fluids, bit now nephrology stopped IV fluids and kept on fluid restriction.   Hold lasix.   Improved now.  3. Acute kidney injury-secondary to dehydration. Give gentle IV fluids, follow BUN and creatinine.  4. MAI infection-continue rifampin, ethambutol, Zithromax - infectious disease consult. - As patient is not able to tolerate the treatment well ID suggested to stop the treatment for 10 days and  reassess in the office.  5. Diarrhea-likely related to the patient's antibiotics. - c diff and Gi panel are negative.   6. Hyperlipidemia - cont. Atorvastatin  7. HTN - bp. On low side.  Hold anti-HTN for now.   8. COPD - no acute exacerbation.  - cont. Dulera, albuterol inhaler.   9. Ac on chronic anemia   Because of dilution likely   Hb 7.2, monitor, Check for [place].   I discussed with his wife, she will be OK if transfusion needed.   Hold heparin [place] for now.  All the records are reviewed and case discussed with Care Management/Social Workerr. Management plans discussed with the patient, family and they are in agreement.  CODE STATUS: full  TOTAL TIME TAKING CARE OF THIS PATIENT: 35 critical care minutes.  Discussed with ID and pulmonary physicians.  POSSIBLE D/C IN 2-3 DAYS, DEPENDING ON CLINICAL CONDITION.   [name] M.D on 07/22/2016   Between 7am to 6pm - Pager - [phone]  After 6pm go to [email or web address] - password [email or web address] [facility]  Office  [phone]  CC: Primary care physician; Asencion Noble, MD  Note: This dictation was prepared with Dragon dictation along with smaller phrase technology. Any transcriptional errors that result from this process are unintentional.

## 2016-07-22 NOTE — Progress Notes (Signed)
[facility]  ROUNDING NOTE   Subjective:   Wife at bedside.  Tachypnic. Being transferred to Step Down  Na 131.   Objective:  Vital signs in last 24 hours:  Temp:  [97.8 F (36.6 C)-98.5 F (36.9 C)] 98.5 F (36.9 C) (10/25 1300) Pulse Rate:  [81-102] 100 (10/25 1302) Resp:  [21-40] 40 (10/25 1300) BP: (123-125)/(56-64) 125/64 (10/25 1300) SpO2:  [95 %-100 %] 100 % (10/25 1302)  Weight change:  Filed Weights   07/20/16 1039 07/20/16 1545  Weight: 74.7 kg (164 lb 9.6 oz) 74.4 kg (164 lb)    Intake/Output: I/O last 3 completed shifts: In: 6578 [P.O.:900; I.V.:804] Out: 500 [Urine:500]   Intake/Output this shift:  No intake/output data recorded.  Physical Exam: General: In respiratory distress  Head: Normocephalic, atraumatic. Moist oral mucosal membranes  Eyes: Anicteric, PERRL  Neck: Supple, trachea midline  Lungs:  Coarse breath sounds, bilateral wheezing  Heart: Regular rate and rhythm  Abdomen:  Soft, nontender, obese  Extremities: + peripheral edema in wrappings.   Neurologic: Nonfocal, moving all four extremities  Skin: No lesions       Basic Metabolic Panel:  Recent Labs Lab 07/20/16 1032 07/21/16 0522 07/22/16 0523  NA 124* 128* 131*  K 3.5 3.2* 4.0  CL 91* 100* 105  CO2 20* 19* 18*  GLUCOSE 127* 95 88  BUN '15 12 7  '$ CREATININE 1.59* 1.05 0.79  CALCIUM 8.4* 7.5* 8.2*    Liver Function Tests:  Recent Labs Lab 07/20/16 1032  AST 28  ALT 20  ALKPHOS 48  BILITOT 0.5  PROT 6.5  ALBUMIN 3.0*   No results for input(s): LIPASE, AMYLASE in the last 168 hours. No results for input(s): AMMONIA in the last 168 hours.  CBC:  Recent Labs Lab 07/20/16 1032 07/21/16 0522 07/21/16 1130 07/22/16 0523  WBC 17.6* 10.6 11.3* 10.8*  HGB 8.8* 6.9* 7.2* 7.8*  HCT 26.1* 20.8* 21.4* 23.9*  MCV 84.4 85.7 84.1 86.7  PLT 462* 337 345 354    Cardiac Enzymes:  Recent Labs Lab 07/20/16 1032  TROPONINI <0.03    BNP: Invalid  input(s): POCBNP  CBG:  Recent Labs Lab 07/21/16 0028  GLUCAP 105*    Microbiology: Results for orders placed or performed during the hospital encounter of 07/20/16  Gastrointestinal Panel by PCR , Stool     Status: None   Collection Time: 07/20/16  6:40 PM  Result Value Ref Range Status   Campylobacter species NOT DETECTED NOT DETECTED Final   Plesimonas shigelloides NOT DETECTED NOT DETECTED Final   Salmonella species NOT DETECTED NOT DETECTED Final   Yersinia enterocolitica NOT DETECTED NOT DETECTED Final   Vibrio species NOT DETECTED NOT DETECTED Final   Vibrio cholerae NOT DETECTED NOT DETECTED Final   Enteroaggregative E coli (EAEC) NOT DETECTED NOT DETECTED Final   Enteropathogenic E coli (EPEC) NOT DETECTED NOT DETECTED Final   Enterotoxigenic E coli (ETEC) NOT DETECTED NOT DETECTED Final   Shiga like toxin producing E coli (STEC) NOT DETECTED NOT DETECTED Final   Shigella/Enteroinvasive E coli (EIEC) NOT DETECTED NOT DETECTED Final   Cryptosporidium NOT DETECTED NOT DETECTED Final   Cyclospora cayetanensis NOT DETECTED NOT DETECTED Final   Entamoeba histolytica NOT DETECTED NOT DETECTED Final   Giardia lamblia NOT DETECTED NOT DETECTED Final   Adenovirus F40/41 NOT DETECTED NOT DETECTED Final   Astrovirus NOT DETECTED NOT DETECTED Final   Norovirus GI/GII NOT DETECTED NOT DETECTED Final   Rotavirus A NOT  DETECTED NOT DETECTED Final   Sapovirus (I, II, IV, and V) NOT DETECTED NOT DETECTED Final  C difficile quick scan w PCR reflex     Status: None   Collection Time: 07/20/16  6:40 PM  Result Value Ref Range Status   C Diff antigen NEGATIVE NEGATIVE Final   C Diff toxin NEGATIVE NEGATIVE Final   C Diff interpretation No C. difficile detected.  Final    Coagulation Studies: No results for input(s): LABPROT, INR in the last 72 hours.  Urinalysis:  Recent Labs  07/22/16 1245  COLORURINE YELLOW*  LABSPEC 1.011  PHURINE 5.0  GLUCOSEU NEGATIVE  HGBUR 1+*   BILIRUBINUR NEGATIVE  KETONESUR 1+*  PROTEINUR 30*  NITRITE NEGATIVE  LEUKOCYTESUR NEGATIVE      Imaging: Dg Chest 2 View  Result Date: 07/22/2016 CLINICAL DATA:  Pneumonia.  Shortness of breath, weakness. EXAM: CHEST  2 VIEW COMPARISON:  07/20/2016 FINDINGS: Chronic changes are again noted throughout the left lung with cavitary area at the left lung base. This is stable when compared to prior study. Underlying COPD. Biapical scarring. No confluent opacities to the right. Heart is borderline in size. IMPRESSION: Stable COPD. Stable chronic changes throughout the left lung including cavitary area at the left lung base. No acute findings or change. Electronically Signed   By: Rolm Baptise M.D.   On: 07/22/2016 09:52     Medications:     . aspirin EC  81 mg Oral Daily  . atorvastatin  10 mg Oral Daily  . cefTRIAXone  1 g Intravenous Q24H  . feeding supplement (ENSURE ENLIVE)  237 mL Oral BID BM  . guaiFENesin  600 mg Oral Daily  . mometasone-formoterol  2 puff Inhalation BID   acetaminophen **OR** acetaminophen, albuterol, haloperidol lactate, ondansetron **OR** ondansetron (ZOFRAN) IV, ondansetron  Assessment/ Plan:  [name] is a 70 y.o. white male with history of lung cancer, DVT, pulmonary hypertension, mycobacterium infection, chronic hyponatremia, anemia, pneumothorax, and COPD, who was admitted to [facility] on 07/20/2016   1. Hyponatremia: hypervolumic. Hypo-osmolar - improved - Continue fluid restriction  2. Hypokalemia: also due to poor PO intake and NS - improved - Potassium chloride PO   3. Edema: with bilateral venous insufficiency and hypoalbuminemia - wound care consulted. Patient with wrappings  4. Metabolic Acidosis: exacerbated by NS - discontinued NS, monitor  5. Anemia: hemoglobin 7.8 on recheck.    LOS: 2 Joanna Borawski 10/25/20171:38 PM

## 2016-07-22 NOTE — Consult Note (Signed)
PULMONARY / CRITICAL CARE MEDICINE   Name: [name] MRN: [id number] DOB: 20-May-1946    ADMISSION DATE:  07/20/2016 CONSULTATION DATE:  07/20/2016  REFERRING MD:  Dr. Anselm Jungling  CHIEF COMPLAINT:  Hypotension  HISTORY OF PRESENT ILLNESS:   This is a 70 yo male with a PMH of spontaneous pneumothorax (total of 3 occurrences), small cell carcinoma of lung, pneumonia, mycobacterium avium complex (dx: 06/24/2016), DVT, benign thyroid tumor, COPD, dyspnea, clotting disorder, asthma, arthritis of lumbar spine, and acute MI.  He presented to [facility] ER on 10/23 with c/o worsening bilateral lower extremity edema, weakness, poor po intake, and difficulty with ambulation.  He was recently discharged 2-3 weeks ago after receiving treatment for a COPD flare.  He has a chronic respiratory infection secondary to MAI and is currently on long-term antibiotics and is followed by infectious disease MD Dr. Ola Spurr who recently placed the pt on lasix due to lower extremity edema.  According to ER notes his wife stated despite taking lasix as prescribed he still has had c/o weakness and bilateral lower extremity edema prompting his current visit to ER.  She also states the pt has had diarrhea 1 day prior to this presentation to ER with a total of 10 episodes the stools were not bloody according to pts. wife.  Upon arrival to ER it was noted the pt was acutely hyponatremic and dehydrated with acute kidney injury and subsequently admitted to the hospital.  PCCM consulted for additional management of acute on chronic respiratory failure secondary to AECOPD and mycobacterium avium complex requiring transfer to ICU and requiring Bipap.  PAST MEDICAL HISTORY :  He  has a past medical history of Acute MI; Arthritis of lumbar spine ([place]); Asthma; Clotting disorder ([place]); Collapsed lung; COPD (chronic obstructive pulmonary disease) ([place]); Dyspnea; History of benign thyroid tumor; Lung cancer ([place]); Mycobacterium avium complex  ([place]) (06/24/2016); Pneumonia; Pneumothorax; Small cell carcinoma of lung ([place]); and Spontaneous pneumothorax.  PAST SURGICAL HISTORY: He  has a past surgical history that includes left upper lobectomy for small cell lung cancer; Chest tube insertion; VATS R thoracotomy; benign thryoid nodule resected; Appendectomy; Cardiac catheterization (2013); and Colonoscopy (N/A, 08/30/2015).  Allergies  Allergen Reactions  . Other Other (See Comments)    Hydromet cough syrup-causes GI upset  . Codeine Nausea And Vomiting    No current facility-administered medications on file prior to encounter.    Current Outpatient Prescriptions on File Prior to Encounter  Medication Sig  . acetaminophen (TYLENOL) 325 MG tablet Take 650 mg by mouth as needed.    Marland Kitchen albuterol (PROVENTIL HFA;VENTOLIN HFA) 108 (90 Base) MCG/ACT inhaler Inhale 2 puffs into the lungs every 6 (six) hours as needed for wheezing or shortness of breath.  Marland Kitchen aspirin 81 MG EC tablet Take 81 mg by mouth daily. Swallow whole.  Marland Kitchen atorvastatin (LIPITOR) 10 MG tablet TAKE 1 TABLET BY MOUTH EVERY DAY  . azithromycin (ZITHROMAX) 500 MG tablet Take 1 tablet (500 mg total) by mouth daily.  Marland Kitchen ethambutol (MYAMBUTOL) 400 MG tablet Take 3 tablets (1,200 mg total) by mouth daily.  . Fluticasone-Salmeterol (ADVAIR DISKUS) 100-50 MCG/DOSE AEPB Inhale 1 puff into the lungs 2 (two) times daily.  Marland Kitchen guaiFENesin (MUCINEX) 600 MG 12 hr tablet Take 600 mg by mouth daily.   Marland Kitchen losartan (COZAAR) 100 MG tablet Take 100 mg by mouth daily.  . ondansetron (ZOFRAN) 4 MG tablet Take 1 tablet (4 mg total) by mouth every 6 (six) hours as needed for nausea.  Marland Kitchen  rifampin (RIFADIN) 300 MG capsule Take 2 capsules (600 mg total) by mouth daily. (Patient taking differently: Take 600 mg by mouth daily. Take on empty stomach)  . traMADol (ULTRAM) 50 MG tablet Takes 2 tablets 4 times daily as needed for pain.    FAMILY HISTORY:  His indicated that his mother is deceased. He indicated  that his father is deceased. He indicated that his brother is alive. He indicated that his child is alive. He indicated that his other is alive.    SOCIAL HISTORY: He  reports that he quit smoking about 24 years ago. His smoking use included Cigars. He has a 90.00 pack-year smoking history. He has never used smokeless tobacco. He reports that he does not drink alcohol or use drugs.  REVIEW OF SYSTEMS:   Unable to assess due to respiratory status  SUBJECTIVE:  Pt in severe respiratory distress states his breathing has never been the bad and complains of mid sternal burning sensation chest pain.  Currently on 2L O2 via nasal canula  VITAL SIGNS: BP 125/64 (BP Location: Right Arm)   Pulse 100   Temp 98.5 F (36.9 C) (Oral)   Resp (!) 40   Ht '5\' 7"'$  (1.702 m)   Wt 164 lb (74.4 kg)   SpO2 100%   BMI 25.69 kg/m   HEMODYNAMICS:    VENTILATOR SETTINGS:    INTAKE / OUTPUT: I/O last 3 completed shifts: In: 5638 [P.O.:900; I.V.:804] Out: 500 [Urine:500]  PHYSICAL EXAMINATION: General:  Acutely ill appearing Caucasian male Neuro:  Alert and oriented, follows commands, PERRLA HEENT: supple, no JVD Cardiovascular:  Sinus tachycardia, s1s2, no M/R/G Lungs:  Course with inspiratory and expiratory wheezes, labored respirations with use of accessory muscles Abdomen:  +BS x4, soft, non tender, non distended Musculoskeletal:  Normal bulk and tone una boots present bilateral lower extremities Skin:  Intact no rashes or lesions  LABS:  BMET  Recent Labs Lab 07/20/16 1032 07/21/16 0522 07/22/16 0523  NA 124* 128* 131*  K 3.5 3.2* 4.0  CL 91* 100* 105  CO2 20* 19* 18*  BUN '15 12 7  '$ CREATININE 1.59* 1.05 0.79  GLUCOSE 127* 95 88    Electrolytes  Recent Labs Lab 07/20/16 1032 07/21/16 0522 07/22/16 0523  CALCIUM 8.4* 7.5* 8.2*    CBC  Recent Labs Lab 07/21/16 0522 07/21/16 1130 07/22/16 0523  WBC 10.6 11.3* 10.8*  HGB 6.9* 7.2* 7.8*  HCT 20.8* 21.4* 23.9*  PLT  337 345 354    Coag's No results for input(s): APTT, INR in the last 168 hours.  Sepsis Markers No results for input(s): LATICACIDVEN, PROCALCITON, O2SATVEN in the last 168 hours.  ABG No results for input(s): PHART, PCO2ART, PO2ART in the last 168 hours.  Liver Enzymes  Recent Labs Lab 07/20/16 1032  AST 28  ALT 20  ALKPHOS 48  BILITOT 0.5  ALBUMIN 3.0*    Cardiac Enzymes  Recent Labs Lab 07/20/16 1032  TROPONINI <0.03    Glucose  Recent Labs Lab 07/21/16 0028  GLUCAP 105*    Imaging Dg Chest 2 View  Result Date: 07/22/2016 CLINICAL DATA:  Pneumonia.  Shortness of breath, weakness. EXAM: CHEST  2 VIEW COMPARISON:  07/20/2016 FINDINGS: Chronic changes are again noted throughout the left lung with cavitary area at the left lung base. This is stable when compared to prior study. Underlying COPD. Biapical scarring. No confluent opacities to the right. Heart is borderline in size. IMPRESSION: Stable COPD. Stable chronic changes throughout the  left lung including cavitary area at the left lung base. No acute findings or change. Electronically Signed   By: Rolm Baptise M.D.   On: 07/22/2016 09:52   STUDIES:  [place] Lower Extremities 10/23>>negative Echo 10/23>>EF 65% to 70%  CULTURES: Urine 10/23>> Sputum 10/23>> Cdiff 10/23>>negative GI panel 10/23>>negative  ANTIBIOTICS: Ceftriaxone 10/25>>  SIGNIFICANT EVENTS: 10/23-Pt admitted to [facility] 10/25-Pt transferred to [facility] ICU due to acute on chronic respiratory failure secondary to MAC infection requiring Bipap  LINES/TUBES: PIV's x2 10/23>>  ASSESSMENT / PLAN:  PULMONARY A: Acute on chronic respiratory failure secondary to AECOPD and MAC infection Hx: Spontaneous pneumothorax, small cell carcinoma of lung, and Asthma P:   Bipap for now Maintain O2 sats 88% to 92% Continue bronchodilators Pulmonary hygiene CXR in am Prn ABG's  CARDIOVASCULAR A:  Hypotension-resolved Chest pain Chronic anterior  ischemic changes on EKG-no ST elevation Hx: Acute MI and DVT P:  Maintain map >65 Continue outpatient aspirin Low dose metoprolol and transdermal nitroglycerin Hold outpatient cozaar Trend troponin's Repeat EKG 10/25 Cardiology consulted appreciate input Heparin gtt  RENAL A:   Hyponatremia-improving  P:   Trend BMP's Replace electrolytes as indicated Monitor uop  GASTROINTESTINAL A:   Diarrhea Failure to thrive P:   Continue current diet once off Bipap and able to tolerate Pepcid for PUD prophylaxis  HEMATOLOGIC A:   Anemia Hx: Small cell carcinoma of lung, benign thyroid cancer, and clotting disorder P:  Heparin gtt per pharmacy dosing for VTE prophylaxis no bolus due to Anemia SCD's for VTE prophylaxis Monitor for s/sx of bleeding Transfuse for hgb <7  INFECTIOUS A:   Right otitis media  Mild leukocytosis MAC infection  P:    Infectious Disease consulted appreciate input Per ID triple abx therapy stopped for treatment of MAI infection for a recommended total of 10 days due to current symptoms Continue abx as listed above Trend WBC's and monitor fever curve Follow cultures   ENDOCRINE A:   No acute issues P:   Monitor serum glucose Hyper/Hypoglycemia protocol  NEUROLOGIC A:   Hx: Arthritis of lumbar spine P:   Avoid sedating medications Promote family presence at bedside    FAMILY  - Updates: Pt and pts wife updated about plan of care and questions answered 07/22/2016   - Inter-disciplinary family meet or Palliative Care meeting due by:  07/29/2016    Marda Stalker, [place] Pager [phone] (please enter 7 digits) PCCM Consult Pager [phone] (please enter 7 digits)   PCCM ATTENDING ATTESTATION:  I have evaluated patient with the APP [place], reviewed database in its entirety and discussed care plan in detail. In addition, this patient was discussed on multidisciplinary rounds.   Important exam  findings: COPD Chronic pulmonary MAC Acute on chronic respiratory failure with hypoxemia Moderate respiratory distress AECOPD Chest pain, H/O CAD Abnormal EKG  Major problems addressed by PCCM team: Acute on chronic respiratory failure  PLAN/REC: PRN BiPAP Nebulized steroids and bronchodilators Supplemental O2 Empiric ceftriaxone Cycle cardiac markers Serial EKGs Cardiology following Discussed with Dr Ola Spurr  Holding MAC therapy  Merton Border, MD PCCM service Mobile [phone] Pager [phone]

## 2016-07-22 NOTE — Progress Notes (Signed)
ANTICOAGULATION CONSULT NOTE - Initial Consult  Pharmacy Consult for Heparin3 Indication: chest pain/ACS  Allergies  Allergen Reactions  . Other Other (See Comments)    Hydromet cough syrup-causes GI upset  . Codeine Nausea And Vomiting    Patient Measurements: Height: '5\' 7"'$  (170.2 cm) Weight: 164 lb (74.4 kg) IBW/kg (Calculated) : 66.1 Heparin Dosing Weight: 74.4 kg  Vital Signs: Temp: 98.4 F (36.9 C) (10/25 1424) Temp Source: Oral (10/25 1424) BP: 151/79 (10/25 1424) Pulse Rate: 105 (10/25 1424)  Labs:  Recent Labs  07/20/16 1032 07/21/16 0522 07/21/16 1130 07/22/16 0523 07/22/16 1443  HGB 8.8* 6.9* 7.2* 7.8*  --   HCT 26.1* 20.8* 21.4* 23.9*  --   PLT 462* 337 345 354  --   CREATININE 1.59* 1.05  --  0.79  --   TROPONINI <0.03  --   --   --  0.11*    Estimated Creatinine Clearance: 81.5 mL/min (by C-G formula based on SCr of 0.79 mg/dL).   Medical History: Past Medical History:  Diagnosis Date  . Acute MI   . Arthritis of lumbar spine ([place])   . Asthma   . Clotting disorder ([place])   . Collapsed lung    x3  . COPD (chronic obstructive pulmonary disease) ([place])   . Dyspnea   . History of benign thyroid tumor   . Lung cancer ([place])   . Mycobacterium avium complex ([place]) 06/24/2016  . Pneumonia   . Pneumothorax   . Small cell carcinoma of lung ([place])   . Spontaneous pneumothorax     Assessment: 70 y/o M with a h/o COPD, lung CA, hyponatremia, CAD, DVT, MAI, MI, and anemia admitted with hypotension. Transferred to stepdown 10/25 for acute respiratory distress. Patient developing chest pain to begin heparin infusion.   Goal of Therapy:  Heparin level 0.3-0.7 units/ml Monitor platelets by anticoagulation protocol: Yes   Plan:  Start heparin infusion at 900 units/hr without bolus due to anemia per CCM.  Check anti-Xa level in 6 hours and daily while on heparin Continue to monitor H&H and platelets  Ulice Dash D 07/22/2016,4:11 PM

## 2016-07-22 NOTE — Progress Notes (Signed)
Respiratory administered PRN breathing treatment, PRN haldol given, awaiting chest x-ray, antibiotics ordered for suspected UTI. Will continue to monitor. Villa Herb Rn-BC

## 2016-07-22 NOTE — Progress Notes (Signed)
[facility] INFECTIOUS DISEASE PROGRESS NOTE Date of Admission:  07/20/2016     ID: ADVIT TRETHEWEY is a 70 y.o. male with hPrincipal Problem:   Anemia Active Problems:   Adenocarcinoma of left lung (HCC)   COPD mixed type (HCC)   PNA (pneumonia)   Mycobacterium avium complex ([place])   Hyponatremia   General weakness  Subjective: Feeling weaker, had worsening copd exacerbation.  ROS  Eleven systems are reviewed and negative except per hpi  Medications:  Antibiotics Given (last 72 hours)    Date/Time Action Medication Dose Rate   07/20/16 1826 Given   azithromycin (ZITHROMAX) tablet 500 mg 500 mg    07/20/16 1827 Given   rifampin (RIFADIN) capsule 600 mg 600 mg    07/21/16 0942 Given   ethambutol (MYAMBUTOL) tablet 1,200 mg 1,200 mg    07/21/16 8469 Given   rifampin (RIFADIN) capsule 600 mg 600 mg    07/22/16 0645 Given   cefTRIAXone (ROCEPHIN) IVPB 1 g 1 g 100 mL/hr     . aspirin EC  81 mg Oral Daily  . atorvastatin  10 mg Oral Daily  . cefTRIAXone  1 g Intravenous Q24H  . feeding supplement (ENSURE ENLIVE)  237 mL Oral BID BM  . guaiFENesin  600 mg Oral Daily  . mometasone-formoterol  2 puff Inhalation BID    Objective: Vital signs in last 24 hours: Temp:  [97.8 F (36.6 C)-98.5 F (36.9 C)] 98.5 F (36.9 C) (10/25 1300) Pulse Rate:  [81-102] 100 (10/25 1302) Resp:  [21-40] 40 (10/25 1300) BP: (123-125)/(56-64) 125/64 (10/25 1300) SpO2:  [95 %-100 %] 100 % (10/25 1302) Constitutional: He is oriented to person, place, and time. Frail, in resp distress HENT: anicteric Mouth/Throat: Oropharynx is clear and dry . No oropharyngeal exudate.  Cardiovascular: Normal rate, regular rhythm and normal heart sounds. E Pulmonary/Chest: tachypneic, very poor air movement, on O2 Abdominal: Soft. Bowel sounds are normal. He exhibits no distension. There is no tenderness.  Lymphadenopathy: He has no cervical adenopathy.  Neurological: He is alert and oriented to person,  place, and time.  Skin: bil LE wrapped in unnawrap  Lab Results  Recent Labs  07/21/16 0522 07/21/16 1130 07/22/16 0523  WBC 10.6 11.3* 10.8*  HGB 6.9* 7.2* 7.8*  HCT 20.8* 21.4* 23.9*  NA 128*  --  131*  K 3.2*  --  4.0  CL 100*  --  105  CO2 19*  --  18*  BUN 12  --  7  CREATININE 1.05  --  0.79    Microbiology: Results for orders placed or performed during the hospital encounter of 07/20/16  Gastrointestinal Panel by PCR , Stool     Status: None   Collection Time: 07/20/16  6:40 PM  Result Value Ref Range Status   Campylobacter species NOT DETECTED NOT DETECTED Final   Plesimonas shigelloides NOT DETECTED NOT DETECTED Final   Salmonella species NOT DETECTED NOT DETECTED Final   Yersinia enterocolitica NOT DETECTED NOT DETECTED Final   Vibrio species NOT DETECTED NOT DETECTED Final   Vibrio cholerae NOT DETECTED NOT DETECTED Final   Enteroaggregative E coli (EAEC) NOT DETECTED NOT DETECTED Final   Enteropathogenic E coli (EPEC) NOT DETECTED NOT DETECTED Final   Enterotoxigenic E coli (ETEC) NOT DETECTED NOT DETECTED Final   Shiga like toxin producing E coli (STEC) NOT DETECTED NOT DETECTED Final   Shigella/Enteroinvasive E coli (EIEC) NOT DETECTED NOT DETECTED Final   Cryptosporidium NOT DETECTED NOT DETECTED Final  Cyclospora cayetanensis NOT DETECTED NOT DETECTED Final   Entamoeba histolytica NOT DETECTED NOT DETECTED Final   Giardia lamblia NOT DETECTED NOT DETECTED Final   Adenovirus F40/41 NOT DETECTED NOT DETECTED Final   Astrovirus NOT DETECTED NOT DETECTED Final   Norovirus GI/GII NOT DETECTED NOT DETECTED Final   Rotavirus A NOT DETECTED NOT DETECTED Final   Sapovirus (I, II, IV, and V) NOT DETECTED NOT DETECTED Final  C difficile quick scan w PCR reflex     Status: None   Collection Time: 07/20/16  6:40 PM  Result Value Ref Range Status   C Diff antigen NEGATIVE NEGATIVE Final   C Diff toxin NEGATIVE NEGATIVE Final   C Diff interpretation No C.  difficile detected.  Final     Studies/Results: Dg Chest 2 View  Result Date: 07/22/2016 CLINICAL DATA:  Pneumonia.  Shortness of breath, weakness. EXAM: CHEST  2 VIEW COMPARISON:  07/20/2016 FINDINGS: Chronic changes are again noted throughout the left lung with cavitary area at the left lung base. This is stable when compared to prior study. Underlying COPD. Biapical scarring. No confluent opacities to the right. Heart is borderline in size. IMPRESSION: Stable COPD. Stable chronic changes throughout the left lung including cavitary area at the left lung base. No acute findings or change. Electronically Signed   By: Rolm Baptise M.D.   On: 07/22/2016 09:52    Assessment/Plan: WILDER KUROWSKI is a 70 y.o. male with chronic MAI, on treatment for about a month (rif,, etham and azithro). He also has advance COPD and follows with [facility]. He had been seen 10/10 with increasing edema- I had him stop chlorthalidone and start lasix and he was to fu cards and get echo. Admitted now with worsening edema, increased cr and chronic hyponatremia.   Overall he appears weaker and has some chronic nausea with the triple abx. 10/24 we stopped his triple therapy. Developed worsening copd and now in unit   Recommendations Check sputum cx Would recommoned holding the triple therapy for about 10 days This may give him time to recover his strength as treatment for MAI can be difficult to tolerate.   Thank you very much for the consult. Will follow with you.  [place], DAVID P   07/22/2016, 2:18 PM

## 2016-07-22 NOTE — Progress Notes (Signed)
Notified by RN pt is confused and not keeping Bipap mask in place.  Assessed pt he is alert and oriented, however slightly agitated placed order for haldol.  Will continue to monitor and assess pt.  Marda Stalker, [place] Pager [phone] (please enter 7 digits) PCCM Consult Pager [phone] (please enter 7 digits)

## 2016-07-22 NOTE — Progress Notes (Signed)
Patient educated on Haldol and why it was going to be given. Patient stated he would not accept any medications without his wife present to review. After patient refused, medication returned to pyxis.

## 2016-07-22 NOTE — Progress Notes (Signed)
Pt continues to take oxygen off. Respirations elevated to 30's and 40's, notified doctor of labored breathing and chest xray to be ordered. Villa Herb RN-BC

## 2016-07-22 NOTE — Evaluation (Signed)
Clinical/Bedside Swallow Evaluation Patient Details  Name: [name] MRN: [id number] Date of Birth: 04/06/1946  Today's Date: 07/22/2016 Time: SLP Start Time (ACUTE ONLY): 56 SLP Stop Time (ACUTE ONLY): 1320 SLP Time Calculation (min) (ACUTE ONLY): 60 min  Past Medical History:  Past Medical History:  Diagnosis Date  . Acute MI   . Arthritis of lumbar spine ([place])   . Asthma   . Clotting disorder ([place])   . Collapsed lung    x3  . COPD (chronic obstructive pulmonary disease) ([place])   . Dyspnea   . History of benign thyroid tumor   . Lung cancer ([place])   . Mycobacterium avium complex ([place]) 06/24/2016  . Pneumonia   . Pneumothorax   . Small cell carcinoma of lung ([place])   . Spontaneous pneumothorax    Past Surgical History:  Past Surgical History:  Procedure Laterality Date  . APPENDECTOMY    . benign thryoid nodule resected    . CARDIAC CATHETERIZATION  2013   [facility]  . CHEST TUBE INSERTION    . COLONOSCOPY N/A 08/30/2015   Procedure: COLONOSCOPY;  Surgeon: Rogene Houston, MD;  Location: [facility];  Service: Endoscopy;  Laterality: N/A;  1:45  . left upper lobectomy for small cell lung cancer    . VATS R thoracotomy     HPI:      Assessment / Plan / Recommendation Clinical Impression  Pt appears at mild increased risk for aspiration secondary to frequently, harsh coughing at baseline which impacts the Pulmonary status for coordination of swallowing and breathing which interferes w/ any other tasks including taking po's and swallowing. Pt and family stated pt has had increased coughing in the past 2 weeks once becoming sick and as he experienced N/V from a new antibiotic d/t the infection issue. Per MD note, pt has MAI on chronic triple therapy admitted with increasing edema, hyponatremia, elevated cr. He continue to take his meds but feels fatigued, chronic nausea with them. Has some loose stools as well. Cough persists, no fevers per MD note. Due to the increased  coughing w/ any exertion and the increased RR/effort, pt must take small bites and sips w/ frequent rest breaks to lessen risk for aspiration and increased fatigue from the exertion. No immediate coughing was noted w/ these trials given today; oral phase increased in time somewhat d/t the exertion of the mastication. Education given on strict aspiration precautions; recommendation for rest breaks and meds in puree for easier swallowing. Recommend modifying the diet to a level 3 w/ thin liquids; feeding assistance. Of note, family member stated pt has been used to wearing/doing Chest PT 2x daily to clear phlegm at home(for ~3 months). He has not done Chest PT while in the hospital. MD/NSG made aware.     Aspiration Risk  Mild aspiration risk    Diet Recommendation  Dysphagia 3, Thin liquids; general aspiration precautions. Rest breaks frequently to lessen any impact of baseline coughing.   Medication Administration: Whole meds with liquid (or in puree for easier swallowing )    Other  Recommendations Recommended Consults:  (Dietician) Oral Care Recommendations: Oral care BID;Staff/trained caregiver to provide oral care   Follow up Recommendations  (TBD)      Frequency and Duration min 3x week  2 weeks       Prognosis Prognosis for Safe Diet Advancement: Fair (-Good) Barriers to Reach Goals: Severity of deficits      Swallow Study   General Date of Onset:  07/20/16 Type of Study: Bedside Swallow Evaluation Previous Swallow Assessment: none Diet Prior to this Study: Regular;Thin liquids Temperature Spikes Noted: No (wbc 10.6-11.3) Respiratory Status: Nasal cannula (2-4 liters; increased RR 21-40) History of Recent Intubation: No Behavior/Cognition: Alert;Cooperative;Pleasant mood;Distractible;Requires cueing Oral Cavity Assessment: Dry Oral Care Completed by SLP: Recent completion by staff Oral Cavity - Dentition: Missing dentition Vision: Functional for self-feeding Self-Feeding  Abilities: Needs assist;Needs set up;Total assist (weakness) Patient Positioning: Upright in bed Baseline Vocal Quality: Low vocal intensity;Breathy (increased respiratory effort w/ the exertion) Volitional Cough: Strong (nonproductive) Volitional Swallow: Able to elicit    Oral/Motor/Sensory Function Overall Oral Motor/Sensory Function: Within functional limits   Ice Chips Ice chips: Not tested   Thin Liquid Thin Liquid: Within functional limits Presentation: Cup;Straw;Self Fed (assisted; 8 trials) Other Comments: water, soda    Nectar Thick Nectar Thick Liquid: Not tested   Honey Thick Honey Thick Liquid: Not tested   Puree Puree: Within functional limits Presentation: Spoon (fed; 5 trials)   Solid   GO   Solid: Impaired (mech soft as able; 3 trials) Presentation: Spoon (fed) Oral Phase Impairments: Impaired mastication (increased time needed) Oral Phase Functional Implications: Impaired mastication (prolonged time needed) Pharyngeal Phase Impairments:  (none) Other Comments: although coughing occured intermittently b/t trials similar to the baseline coughing       Orinda Kenner, MS, CCC-SLP  [name] 07/22/2016,1:29 PM

## 2016-07-22 NOTE — Progress Notes (Signed)
PT Cancellation Note  Patient Details Name: [name] MRN: [id number] DOB: 1946/03/06   Cancelled Treatment:     Per nursing pt currently presents with RR of 40 breaths/min and is extremely fatigued and currently unable to assist with any functional mobility.  Nsg requests hold of PT services this date and will attempt PT at a future date as appropriate.    Blanche [place] Zyair Rhein 07/22/2016, 2:38 PM

## 2016-07-22 NOTE — Clinical Social Work Note (Signed)
CSW extended bed offers to patient and his wife and they have chosen [facility]. Shela Leff MSW,LCSW [phone]

## 2016-07-22 NOTE — Progress Notes (Signed)
Patient states he has dentures in. Education was provided that dentures need to be removed while on the Bipap and the risk it causes. Patient continued to refuse.

## 2016-07-23 ENCOUNTER — Inpatient Hospital Stay: Payer: Medicare Other

## 2016-07-23 DIAGNOSIS — J9601 Acute respiratory failure with hypoxia: Secondary | ICD-10-CM

## 2016-07-23 DIAGNOSIS — D500 Iron deficiency anemia secondary to blood loss (chronic): Secondary | ICD-10-CM

## 2016-07-23 DIAGNOSIS — J9621 Acute and chronic respiratory failure with hypoxia: Principal | ICD-10-CM

## 2016-07-23 DIAGNOSIS — R410 Disorientation, unspecified: Secondary | ICD-10-CM

## 2016-07-23 DIAGNOSIS — J9622 Acute and chronic respiratory failure with hypercapnia: Secondary | ICD-10-CM

## 2016-07-23 DIAGNOSIS — J9690 Respiratory failure, unspecified, unspecified whether with hypoxia or hypercapnia: Secondary | ICD-10-CM

## 2016-07-23 LAB — BLOOD GAS, ARTERIAL
ACID-BASE DEFICIT: 5 mmol/L — AB (ref 0.0–2.0)
BICARBONATE: 19 mmol/L — AB (ref 20.0–28.0)
DELIVERY SYSTEMS: POSITIVE
Expiratory PAP: 5
FIO2: 0.28
Inspiratory PAP: 10
O2 SAT: 98.5 %
PATIENT TEMPERATURE: 37
PH ART: 7.41 (ref 7.350–7.450)
pCO2 arterial: 30 mmHg — ABNORMAL LOW (ref 32.0–48.0)
pO2, Arterial: 113 mmHg — ABNORMAL HIGH (ref 83.0–108.0)

## 2016-07-23 LAB — CBC
HEMATOCRIT: 22.2 % — AB (ref 40.0–52.0)
Hemoglobin: 7.3 g/dL — ABNORMAL LOW (ref 13.0–18.0)
MCH: 27.9 pg (ref 26.0–34.0)
MCHC: 32.7 g/dL (ref 32.0–36.0)
MCV: 85.3 fL (ref 80.0–100.0)
PLATELETS: 368 10*3/uL (ref 150–440)
RBC: 2.61 MIL/uL — ABNORMAL LOW (ref 4.40–5.90)
RDW: 16.1 % — AB (ref 11.5–14.5)
WBC: 12 10*3/uL — AB (ref 3.8–10.6)

## 2016-07-23 LAB — URINALYSIS COMPLETE WITH MICROSCOPIC (ARMC ONLY)
BILIRUBIN URINE: NEGATIVE
Glucose, UA: NEGATIVE mg/dL
LEUKOCYTES UA: NEGATIVE
NITRITE: NEGATIVE
PH: 5 (ref 5.0–8.0)
PROTEIN: 30 mg/dL — AB
SPECIFIC GRAVITY, URINE: 1.014 (ref 1.005–1.030)
Squamous Epithelial / LPF: NONE SEEN

## 2016-07-23 LAB — LACTIC ACID, PLASMA: LACTIC ACID, VENOUS: 1.4 mmol/L (ref 0.5–1.9)

## 2016-07-23 LAB — COMPREHENSIVE METABOLIC PANEL
ALBUMIN: 2.4 g/dL — AB (ref 3.5–5.0)
ALT: 23 U/L (ref 17–63)
AST: 35 U/L (ref 15–41)
Alkaline Phosphatase: 41 U/L (ref 38–126)
Anion gap: 11 (ref 5–15)
BUN: 7 mg/dL (ref 6–20)
CHLORIDE: 102 mmol/L (ref 101–111)
CO2: 18 mmol/L — AB (ref 22–32)
CREATININE: 0.7 mg/dL (ref 0.61–1.24)
Calcium: 8 mg/dL — ABNORMAL LOW (ref 8.9–10.3)
GFR calc Af Amer: >60 mL/min (ref >60)
GLUCOSE: 109 mg/dL — AB (ref 65–99)
POTASSIUM: 3.7 mmol/L (ref 3.5–5.1)
Sodium: 131 mmol/L — ABNORMAL LOW (ref 135–145)
Total Bilirubin: 0.6 mg/dL (ref 0.3–1.2)
Total Protein: 5.6 g/dL — ABNORMAL LOW (ref 6.5–8.1)

## 2016-07-23 LAB — OCCULT BLOOD X 1 CARD TO LAB, STOOL: Fecal Occult Bld: POSITIVE — AB

## 2016-07-23 LAB — GLUCOSE, CAPILLARY: Glucose-Capillary: 81 mg/dL (ref 65–99)

## 2016-07-23 LAB — PROCALCITONIN: Procalcitonin: 0.13 ng/mL

## 2016-07-23 LAB — HEPARIN LEVEL (UNFRACTIONATED): Heparin Unfractionated: <0.1 IU/mL — ABNORMAL LOW (ref 0.30–0.70)

## 2016-07-23 LAB — AMMONIA: Ammonia: 22 umol/L (ref 9–35)

## 2016-07-23 LAB — TROPONIN I: Troponin I: 0.09 ng/mL (ref <0.03)

## 2016-07-23 MED ORDER — DEXMEDETOMIDINE HCL IN NACL 400 MCG/100ML IV SOLN
0.2000 ug/kg/h | INTRAVENOUS | Status: AC
  Administered 2016-07-23: 0.4 ug/kg/h via INTRAVENOUS
  Administered 2016-07-24: 0.5 ug/kg/h via INTRAVENOUS
  Filled 2016-07-23 (×3): qty 100

## 2016-07-23 MED ORDER — IRON DEXTRAN 50 MG/ML IJ SOLN
250.0000 mg | Freq: Once | INTRAMUSCULAR | Status: AC
  Administered 2016-07-23: 250 mg via INTRAVENOUS
  Filled 2016-07-23: qty 5

## 2016-07-23 MED ORDER — FAMOTIDINE 20 MG PO TABS
20.0000 mg | ORAL_TABLET | Freq: Two times a day (BID) | ORAL | Status: DC
  Administered 2016-07-24: 20 mg via ORAL
  Filled 2016-07-23 (×2): qty 1

## 2016-07-23 MED ORDER — LOSARTAN POTASSIUM 50 MG PO TABS
100.0000 mg | ORAL_TABLET | Freq: Every day | ORAL | Status: DC
  Administered 2016-07-23: 100 mg via ORAL
  Filled 2016-07-23: qty 2

## 2016-07-23 MED ORDER — HALOPERIDOL LACTATE 5 MG/ML IJ SOLN
2.5000 mg | Freq: Once | INTRAMUSCULAR | Status: AC
  Administered 2016-07-23: 2.5 mg via INTRAVENOUS

## 2016-07-23 MED ORDER — HEPARIN (PORCINE) IN NACL 100-0.45 UNIT/ML-% IJ SOLN
1100.0000 [IU]/h | INTRAMUSCULAR | Status: DC
  Administered 2016-07-23: 1100 [IU]/h via INTRAVENOUS
  Filled 2016-07-23 (×2): qty 250

## 2016-07-23 MED ORDER — HEPARIN BOLUS VIA INFUSION
2050.0000 [IU] | Freq: Once | INTRAVENOUS | Status: AC
  Administered 2016-07-23: 2050 [IU] via INTRAVENOUS
  Filled 2016-07-23: qty 2050

## 2016-07-23 MED ORDER — HALOPERIDOL LACTATE 5 MG/ML IJ SOLN
2.0000 mg | Freq: Four times a day (QID) | INTRAMUSCULAR | Status: DC | PRN
  Filled 2016-07-23: qty 1

## 2016-07-23 MED ORDER — ATORVASTATIN CALCIUM 20 MG PO TABS
10.0000 mg | ORAL_TABLET | Freq: Every day | ORAL | Status: DC
  Administered 2016-07-23 – 2016-07-28 (×4): 10 mg via ORAL
  Filled 2016-07-23 (×4): qty 1

## 2016-07-23 NOTE — Progress Notes (Signed)
Initial Nutrition Assessment  DOCUMENTATION CODES:   Severe malnutrition in context of acute illness/injury  INTERVENTION:  -Continue EnsureEnlive BID between meals -Add Magic Cup TID with meals -Feeding assistance and encouragement at all meal times -Cater to pt preferences; pt likes strawberry yogurt  NUTRITION DIAGNOSIS:   Malnutrition related to acute illness as evidenced by energy intake < or equal to 50% for > or equal to 5 days, moderate to severe fluid accumulation, percent weight loss.  GOAL:   Patient will meet greater than or equal to 90% of their needs  MONITOR:   PO intake, Supplement acceptance, Labs, Weight trends  REASON FOR ASSESSMENT:   Consult Poor PO  ASSESSMENT:   70 yo male admitted with a worsening bilateral LE edema, weakness, poor po intake and difficulty with ambulation; pt with AKI, hyponatremia. Pt with hx of chronic respiratory infection secondary to MAI and is on long-term antibiotics; pt also with small cell carcinoma of lung   Pt appears very uncomfortable on visit today, moving around in the bed, mittens in place. Pt verbal but difficult to understand. Currently on 2L [place]  Pt on Dysphagia III diet s/p SLP evaluation yesterday; pt did not tolerate diet well this AM per nursing. Ate 0%. Pt not swallowing foods, holding in mouth. When attempting to drink from straw, pt blowing out, instead of sucking in. SLP re-evaluated this AM and diet has been changed to Dysphagia I, Thin Liquids.   Wife at bedside and reports appetite has been down for the past several weeks, eating very minimally. Drinking Special K protein shakes at times but not much other intake of significance. Wife unsure about wt loss; per weight encounters, weight has been trending down (6.3% wt loss in 1 month). Prior to this pt with good appetite and stable weight.   Nutrition-Focused physical exam completed. Findings are no fat depletion, mild muscle depletion, and mild/moderate  edema.    Past Medical History:  Diagnosis Date  . Acute MI   . Arthritis of lumbar spine ([place])   . Asthma   . Clotting disorder ([place])   . Collapsed lung    x3  . COPD (chronic obstructive pulmonary disease) ([place])   . Dyspnea   . History of benign thyroid tumor   . Lung cancer ([place])   . Mycobacterium avium complex ([place]) 06/24/2016  . Pneumonia   . Pneumothorax   . Small cell carcinoma of lung ([place])   . Spontaneous pneumothorax      Diet Order:  DIET - DYS 1 Room service appropriate? Yes with Assist; Fluid consistency: Thin  Skin:  Reviewed, no issues  Last BM:  10/25   Labs: sodium 131  Meds: reviewed  Height:   Ht Readings from Last 1 Encounters:  07/22/16 '5\' 7"'$  (1.702 m)    Weight:   Wt Readings from Last 1 Encounters:  07/22/16 150 lb 9.2 oz (68.3 kg)    Filed Weights   07/20/16 1039 07/20/16 1545 07/22/16 1424  Weight: 164 lb 9.6 oz (74.7 kg) 164 lb (74.4 kg) 150 lb 9.2 oz (68.3 kg)   Wt Readings from Last 10 Encounters:  07/22/16 150 lb 9.2 oz (68.3 kg)  07/02/16 161 lb 3.2 oz (73.1 kg)  06/28/16 158 lb (71.7 kg)  06/24/16 163 lb (73.9 kg)  06/12/16 160 lb 6.4 oz (72.8 kg)  05/19/16 158 lb (71.7 kg)  03/02/16 172 lb (78 kg)  09/02/15 168 lb 9.6 oz (76.5 kg)  08/30/15 168 lb (76.2 kg)  08/27/15 168 lb 8 oz (76.4 kg)    BMI:  Body mass index is 23.58 kg/m.  Estimated Nutritional Needs:   Kcal:  2000-2300 kcals   Protein:  82-102 g  Fluid:  >/= 2 L  EDUCATION NEEDS:   No education needs identified at this time  [place], [place], LDN [phone] Pager  [phone] Weekend/On-Call Pager

## 2016-07-23 NOTE — Progress Notes (Signed)
PT Cancellation Note  Patient Details Name: [name] MRN: [id number] DOB: 01-11-1946   Cancelled Treatment:    Reason Eval/Treat Not Completed: Medical issues which prohibited therapy Pt transferred to CCU, PT orders will be competed.  He will need new orders when he is medically appropriate.  Kreg Shropshire 07/23/2016, 10:22 AM

## 2016-07-23 NOTE — Progress Notes (Addendum)
PULMONARY / CRITICAL CARE MEDICINE   Name: [name] MRN: [id number] DOB: 05-05-1946    ADMISSION DATE:  07/20/2016 CONSULTATION DATE:  07/20/2016  REFERRING MD:  Dr. 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  He presented to [facility] ER on 10/23 with c/o worsening bilateral lower extremity edema, weakness, poor po intake, and difficulty with ambulation.  He was recently discharged 2-3 weeks ago after receiving treatment for a COPD flare.  Admitted via ER with hyponatremia, AKI, weakness. Transferred to ICU/SDU 10/25 with increased WOB and confusion.    SUBJECTIVE:  Remains poorly oriented with episodic dyspnea.   VITAL SIGNS: BP 134/70 (BP Location: Right Arm)   Pulse 100   Temp 98 F (36.7 C) (Oral)   Resp (!) 33   Ht '5\' 7"'$  (1.702 m)   Wt 150 lb 9.2 oz (68.3 kg)   SpO2 98%   BMI 23.58 kg/m   HEMODYNAMICS:    VENTILATOR SETTINGS: FiO2 (%):  [28 %] 28 %  INTAKE / OUTPUT: I/O last 3 completed shifts: In: 825.8 [P.O.:600; I.V.:125.8; IV Piggyback:100] Out: 160 [Urine:160]  PHYSICAL EXAMINATION: General: Dyspneic @ rest - better after nebs. Rattling cough Neuro:  Poorly oriented, somewhat confused, no focal deficits HEENT: WNL Cardiovascular: Reg, no M Lungs:  Scattered rhonchi, diminished in LLL Abdomen: soft, NT, +BS Ext: Unna wraps. Pitting edema - symmetric  LABS:  BMET  Recent Labs Lab 07/21/16 0522 07/22/16 0523 07/23/16 0309  NA 128* 131* 131*  K 3.2* 4.0 3.7  CL 100* 105 102  CO2 19* 18* 18*  BUN '12 7 7  '$ CREATININE 1.05 0.79 0.70  GLUCOSE 95 88 109*    Electrolytes  Recent Labs Lab 07/21/16 0522 07/22/16 0523 07/23/16 0309  CALCIUM 7.5* 8.2* 8.0*     CBC  Recent Labs Lab 07/21/16 1130 07/22/16 0523 07/23/16 0309  WBC 11.3* 10.8* 12.0*  HGB 7.2* 7.8* 7.3*  HCT 21.4* 23.9* 22.2*  PLT 345 354 368    Coag's  Recent Labs Lab 07/22/16 1524  APTT 34  INR 1.11    Sepsis Markers  Recent Labs Lab 07/23/16 0731  PROCALCITON 0.13    ABG No results for input(s): PHART, PCO2ART, PO2ART in the last 168 hours.  Liver Enzymes  Recent Labs Lab 07/20/16 1032 07/23/16 0309  AST 28 35  ALT 20 23  ALKPHOS 48 41  BILITOT 0.5 0.6  ALBUMIN 3.0* 2.4*    Cardiac Enzymes  Recent Labs Lab 07/22/16 1443 07/22/16 2131 07/23/16 0309  TROPONINI 0.11* 0.07* 0.09*    Glucose  Recent Labs Lab 07/21/16 0028 07/22/16 1427  GLUCAP 105* 108*    CXR: Stable postsurgical changes and pleural-parenchymal scarring left lung. Stable cavitary lesion left lung base. Stable subsegmental atelectasis and/or scarring right lung base. No acute abnormality identified  STUDIES:  [place] Lower Extremities 10/23>>negative Echo 10/23>>EF 65% to 70% Cardiac markers negative  CULTURES: No new micro  ANTIBIOTICS: Ceftriaxone 10/25 >>     ASSESSMENT: COPD S/P resection for lung cancer Chronic pulmonary MAC Acute on chronic respiratory failure with hypoxemia Moderate respiratory distress AECOPD Delirium Acute on chronic anemia - unclear if acute blood loss Previous dx of iron deficiency anemia   PLAN: Watch in  ICU/SDU through today Cont supplemental O2 as needed Cont PRN BiPAP Cont nebulized bronchodilators Cont empiric ceftriaxone - ID following DC heparin gtt SCDs Transfusion threshold of Hgb < 7.0 Will administer IV iron dextran Low dose PRN haloperidol   Discussed with Dr Rockey Situ and Dr Tennis Ship, MD PCCM service Mobile [phone] Pager [phone] 07/23/2016

## 2016-07-23 NOTE — Progress Notes (Signed)
Patient: [name] / Admit Date: 07/20/2016 / Date of Encounter: 07/23/2016, 7:27 AM   Subjective: Transferred to the ICU for increased work of breathing in the setting of his PNA, lung cancer, and acute on chronic anemia. Developed chest pain. Troponin mildly elevated and flat trending with a peak of 0.11, now down trending. Started on heparin gtt by primary team. 12-lead unchanged. Labs show persiste anemia with a hgb of 7.3 still and worsening albumin to 2.4. Currently, without chest pain. Frequent cough.   Review of Systems: Review of Systems  Constitutional: Positive for malaise/fatigue and weight loss. Negative for chills, diaphoresis and fever.  HENT: Negative for congestion.   Eyes: Negative for discharge and redness.  Respiratory: Positive for cough, shortness of breath and wheezing. Negative for hemoptysis and sputum production.   Cardiovascular: Positive for chest pain. Negative for palpitations, orthopnea, claudication, leg swelling and PND.  Gastrointestinal: Negative for abdominal pain, blood in stool, heartburn, melena, nausea and vomiting.  Genitourinary: Negative for hematuria.  Musculoskeletal: Negative for falls and myalgias.  Skin: Negative for rash.  Neurological: Positive for weakness. Negative for dizziness, tingling, tremors, sensory change, speech change, focal weakness and loss of consciousness.  Endo/Heme/Allergies: Does not bruise/bleed easily.  Psychiatric/Behavioral: Negative for substance abuse. The patient is not nervous/anxious.   All other systems reviewed and are negative.   Objective: Telemetry: NSR, 70's bpm Physical Exam: Blood pressure (!) 136/59, pulse (!) 103, temperature 98.9 F (37.2 C), temperature source Axillary, resp. rate (!) 39, height '5\' 7"'$  (1.702 m), weight 150 lb 9.2 oz (68.3 kg), SpO2 93 %. Body mass index is 23.58 kg/m. General: Critically ill appearing, frail, in no acute distress. Head: Normocephalic, atraumatic, sclera  non-icteric, no xanthomas, nares are without discharge. Neck: Negative for carotid bruits. JVP not elevated. Lungs: Decreased breath sounds bilaterally. Breathing is mildly labored with tachypnea. Heart: RRR S1 S2 without murmurs, rubs, or gallops.  Abdomen: Soft, non-tender, non-distended with normoactive bowel sounds. No rebound/guarding. Extremities: No clubbing or cyanosis. 3+ pitting edema.  Neuro: Alert and oriented X 3. Moves all extremities spontaneously. Psych:  Responds to questions appropriately with a normal affect.   Intake/Output Summary (Last 24 hours) at 07/23/16 0727 Last data filed at 07/23/16 0600  Gross per 24 hour  Intake           225.82 ml  Output               10 ml  Net           215.82 ml    Inpatient Medications:  . aspirin EC  81 mg Oral Daily  . budesonide (PULMICORT) nebulizer solution  0.25 mg Nebulization Q6H  . cefTRIAXone  1 g Intravenous Q24H  . famotidine (PEPCID) IV  20 mg Intravenous Q12H  . feeding supplement (ENSURE ENLIVE)  237 mL Oral BID BM  . ipratropium-albuterol  3 mL Nebulization Q6H  . metoprolol  2.5 mg Intravenous Q6H  . nitroGLYCERIN  1 inch Topical Q6H   Infusions:  . heparin 1,100 Units/hr (07/23/16 0400)    Labs:  Recent Labs  07/22/16 0523 07/23/16 0309  NA 131* 131*  K 4.0 3.7  CL 105 102  CO2 18* 18*  GLUCOSE 88 109*  BUN 7 7  CREATININE 0.79 0.70  CALCIUM 8.2* 8.0*    Recent Labs  07/20/16 1032 07/23/16 0309  AST 28 35  ALT 20 23  ALKPHOS 48 41  BILITOT 0.5 0.6  PROT 6.5  5.6*  ALBUMIN 3.0* 2.4*    Recent Labs  07/22/16 0523 07/23/16 0309  WBC 10.8* 12.0*  HGB 7.8* 7.3*  HCT 23.9* 22.2*  MCV 86.7 85.3  PLT 354 368    Recent Labs  07/20/16 1032 07/22/16 1443 07/22/16 2131 07/23/16 0309  TROPONINI <0.03 0.11* 0.07* 0.09*   Invalid input(s): POCBNP No results for input(s): HGBA1C in the last 72 hours.   Weights: Filed Weights   07/20/16 1039 07/20/16 1545 07/22/16 1424  Weight:  164 lb 9.6 oz (74.7 kg) 164 lb (74.4 kg) 150 lb 9.2 oz (68.3 kg)     Radiology/Studies:  Dg Chest 2 View  Result Date: 07/22/2016 CLINICAL DATA:  Pneumonia.  Shortness of breath, weakness. EXAM: CHEST  2 VIEW COMPARISON:  07/20/2016 FINDINGS: Chronic changes are again noted throughout the left lung with cavitary area at the left lung base. This is stable when compared to prior study. Underlying COPD. Biapical scarring. No confluent opacities to the right. Heart is borderline in size. IMPRESSION: Stable COPD. Stable chronic changes throughout the left lung including cavitary area at the left lung base. No acute findings or change. Electronically Signed   By: Rolm Baptise M.D.   On: 07/22/2016 09:52   Dg Chest 2 View  Result Date: 06/28/2016 CLINICAL DATA:  Cough EXAM: CHEST  2 VIEW COMPARISON:  06/10/2016 FINDINGS: Chronic lung disease including emphysema and scattered bronchiectasis/scarring. Chronic opacity at the left base with fluid level. No progressive airspace opacity, pneumothorax, or pleural effusion. Normal heart size and mediastinal contours. Postoperative left chest. IMPRESSION: 1. Stable compared to prior. 2. Chronic lung disease including emphysema and bronchiectasis with known fluid-filled cavity at the left base. Electronically Signed   By: Monte Fantasia M.D.   On: 06/28/2016 17:05   Ct Angio Chest Pe W And/or Wo Contrast  Result Date: 06/28/2016 CLINICAL DATA:  Increasing shortness of Breath and history of mycobacterial infection pain and previous lung carcinoma EXAM: CT ANGIOGRAPHY CHEST WITH CONTRAST TECHNIQUE: Multidetector CT imaging of the chest was performed using the standard protocol during bolus administration of intravenous contrast. Multiplanar CT image reconstructions and MIPs were obtained to evaluate the vascular anatomy. CONTRAST:  75 mL Isovue 370. COMPARISON:  05/19/2016 FINDINGS: Cardiovascular: Aortic atherosclerotic change is noted without aneurysmal dilatation  or dissection. Minimal coronary calcifications are seen. The pulmonary artery shows a normal branching pattern without filling defects to suggest pulmonary embolism. Mediastinum/Nodes: Left thyroid goiter is noted at the thoracic inlet stable from the prior exam. No discrete nodule is seen. Previously seen AP window node now measures 1.9 cm in short axis increased in size from the prior exam a which time it measured 1.4 cm. No significant hilar lymphadenopathy is noted. Lungs/Pleura: Chronic emphysematous changes are again identified within both lungs. Chronic scarring in the right base is noted. A small left-sided pleural effusion is seen increased from the prior study. There remains a thickened cavitary lesion in the left lower lobe with air-fluid level within likely representing an infected bulla. Scattered smaller cavitary lesions are noted throughout the left lung with some slight increase in wall thickness when compared with the prior exam. Changes of prior left upper lobectomy are noted. Upper Abdomen: Within normal limits. Musculoskeletal: Degenerative changes of the thoracic spine are again noted. Review of the MIP images confirms the above findings. IMPRESSION: No evidence of pulmonary emboli. Stable left thyroid goiter. Slight enlargement of AP window lymph node from 1.4 to 1.9 cm. Persistent thick walled cavitary lesion in  the left lower lobe. Some slight increase in left pleural effusion is noted as well as some increase in the degree of wall thickening of smaller cavitary lesions likely representing some progressive inflammatory change. Electronically Signed   By: Inez Catalina M.D.   On: 06/28/2016 19:37   US Venous Img Lower Bilateral  Result Date: 07/20/2016 CLINICAL DATA:  Bilateral lower extremity swelling for 2 months. History of DVT. EXAM: BILATERAL LOWER EXTREMITY VENOUS DUPLEX ULTRASOUND TECHNIQUE: Doppler venous assessment of the left lower extremity deep venous system was performed,  including characterization of spectral flow, compressibility, and phasicity. COMPARISON:  None. FINDINGS: There is complete compressibility of the bilateral common femoral, femoral, and popliteal veins. Doppler analysis demonstrates respiratory phasicity and augmentation of flow with calf compression. No obvious superficial vein or calf vein thrombosis. IMPRESSION: No evidence of lower extremity DVT. Electronically Signed   By: Marybelle Killings M.D.   On: 07/20/2016 15:27   Dg Chest Port 1 View  Result Date: 07/23/2016 CLINICAL DATA:  Respiratory failure. EXAM: PORTABLE CHEST 1 VIEW COMPARISON:  07/22/2016, 04/29/2016.  CT 05/19/2016 FINDINGS: Mediastinum hilar structures are stable. Heart size stable. Postsurgical changes left lung. Stable chronic changes in the left lung consistent pleural parenchymal scarring. Stable cavitary lesion in the left lung base. Stable atelectatic changes and/or scarring right lung base. No pneumothorax . IMPRESSION: 1. Stable postsurgical changes and pleural-parenchymal scarring left lung. Stable cavitary lesion left lung base. 2. Stable subsegmental atelectasis and/or scarring right lung base. No acute abnormality identified. Chest is unchanged from prior exams. Electronically Signed   By: Marcello Moores  Register   On: 07/23/2016 06:56   Dg Chest Portable 1 View  Result Date: 07/20/2016 CLINICAL DATA:  Shortness of breath, weakness. EXAM: PORTABLE CHEST 1 VIEW COMPARISON:  06/28/2016.  CT 06/28/2016. FINDINGS: Again noted is the area fluid-filled cavity at the left lung base with surrounding airspace disease. Chronic density at the right lung base and in the left upper lobe. Appearance is unchanged since prior study. Probable small left effusion. Heart is normal size. IMPRESSION: Stable chronic lung changes including partially fluid-filled cavitary area at the left lung base. Electronically Signed   By: Rolm Baptise M.D.   On: 07/20/2016 11:07     Assessment and Plan  Principal  Problem:   Acute on chronic respiratory failure (HCC) Active Problems:   Adenocarcinoma of left lung (HCC)   COPD mixed type (HCC)   PNA (pneumonia)   Mycobacterium avium complex (HCC)   Hyponatremia   Anemia   General weakness    1. NSTEMI vs supply demand ischemia: -Cannot rule out his chest pain being 2/2 frequent coughing  -Likely in the setting of his acute on chronic respiratory failure 2/2 PNA, lung cancer, acute on chronic anemia, and malnutrition -Continue heparin gtt for 48 hours for medical therapy -Could consider cardiac cath at later date when he is over his acute illness -Continue medical therapy with metoprolol ans ASA  2. Acute on chronic respiratory failure: -2/2 the above -Requiring BiPAP at times -Wean oxygen as able -Per primary team  3. Acute on chronic anemia: -Likely playing a role in #1 -Continue to recommend transfusion to a goal hgb of > 8.5 -Recommend iron -Defer to primary team  4. PNA with sepsis: -Per pulmonary  5. Malnutrition/LE swelling: -Consider IV albumin -His hypoalbuminemia and severe anemia are playing a strong, if not entire role in his LE swelling 2/2 third spacing -Normal echo and BNP -Consider IV albumin   6.  Lung cancer: -Per primary   Signed, Christell Faith, PA-C [facility] Pager: [phone] 07/23/2016, 7:27 AM   Attending Note Patient seen and examined, agree with detailed note above,  Patient presentation and plan discussed on rounds.   Case discussed with patient's wife in detail Also discussed with Dr. Alva Garnet,  Atypical chest pain in the setting of underlying severe lung disease, MAI/infection, anemia Minimal elevation of troponin likely in the setting of hypoxia, demand ischemia  No plan for intervention at this time Long discussion with patient's wife concerning goals of care  -Which consider iron infusion Possible PRBC transfusion given hematocrit 22 Respiratory support  Greater than 50% was  spent in counseling and coordination of care with patient Total encounter time 35 minutes or more   Signed: Esmond Plants  M.D., Ph.D. [facility]

## 2016-07-23 NOTE — Progress Notes (Signed)
ANTICOAGULATION CONSULT NOTE - Initial Consult  Pharmacy Consult for Heparin3 Indication: chest pain/ACS  Allergies  Allergen Reactions  . Other Other (See Comments)    Hydromet cough syrup-causes GI upset  . Codeine Nausea And Vomiting    Patient Measurements: Height: '5\' 7"'$  (170.2 cm) Weight: 150 lb 9.2 oz (68.3 kg) IBW/kg (Calculated) : 66.1 Heparin Dosing Weight: 74.4 kg  Vital Signs: Temp: 98.9 F (37.2 C) (10/25 2000) Temp Source: Axillary (10/25 2000) BP: 107/53 (10/26 0000) Pulse Rate: 98 (10/26 0000)  Labs:  Recent Labs  07/20/16 1032 07/21/16 0522 07/21/16 1130 07/22/16 0523 07/22/16 1443 07/22/16 1524 07/22/16 2131 07/22/16 2244  HGB 8.8* 6.9* 7.2* 7.8*  --   --   --   --   HCT 26.1* 20.8* 21.4* 23.9*  --   --   --   --   PLT 462* 337 345 354  --   --   --   --   APTT  --   --   --   --   --  34  --   --   LABPROT  --   --   --   --   --  14.3  --   --   INR  --   --   --   --   --  1.11  --   --   HEPARINUNFRC  --   --   --   --   --   --   --  <0.10*  CREATININE 1.59* 1.05  --  0.79  --   --   --   --   TROPONINI <0.03  --   --   --  0.11*  --  0.07*  --     Estimated Creatinine Clearance: 81.5 mL/min (by C-G formula based on SCr of 0.79 mg/dL).   Medical History: Past Medical History:  Diagnosis Date  . Acute MI   . Arthritis of lumbar spine ([place])   . Asthma   . Clotting disorder ([place])   . Collapsed lung    x3  . COPD (chronic obstructive pulmonary disease) ([place])   . Dyspnea   . History of benign thyroid tumor   . Lung cancer ([place])   . Mycobacterium avium complex ([place]) 06/24/2016  . Pneumonia   . Pneumothorax   . Small cell carcinoma of lung ([place])   . Spontaneous pneumothorax     Assessment: 70 y/o M with a h/o COPD, lung CA, hyponatremia, CAD, DVT, MAI, MI, and anemia admitted with hypotension. Transferred to stepdown 10/25 for acute respiratory distress. Patient developing chest pain to begin heparin infusion.   Goal of Therapy:   Heparin level 0.3-0.7 units/ml Monitor platelets by anticoagulation protocol: Yes   Plan:  Start heparin infusion at 900 units/hr without bolus due to anemia per CCM.  Check anti-Xa level in 6 hours and daily while on heparin Continue to monitor H&H and platelets  10/25 2244 HL subtherapeutic. 2050 units IV x 1 bolus and increase rate to 1100 units/hr. Will recheck heparin level in 6 hours.  Laural Benes, Pharm.D., BCPS Clinical Pharmacist 07/23/2016,12:30 AM

## 2016-07-23 NOTE — Progress Notes (Signed)
Key Points: Use following P&T approved IV to PO antibiotic change policy.  Description contains the criteria that are approved Note: Policy Excludes:  Esophagectomy patientsPHARMACIST - PHYSICIAN COMMUNICATION DR:   Darvin Neighbours CONCERNING: IV to Oral Route Change Policy  RECOMMENDATION: This patient is receiving famotidine by the intravenous route.  Based on criteria approved by the Pharmacy and Therapeutics Committee, the intravenous medication(s) is/are being converted to the equivalent oral dose form(s).   DESCRIPTION: These criteria include:  The patient is eating (either orally or via tube) and/or has been taking other orally administered medications for a least 24 hours  The patient has no evidence of active gastrointestinal bleeding or impaired GI absorption (gastrectomy, short bowel, patient on TNA or NPO).  If you have questions about this conversion, please contact the Pharmacy Department  '[]'$   [phone] )  [facility] '[x]'$   [phone] )  [facility] '[]'$   [phone] )  [facility] '[]'$   [phone] )  [facility] '[]'$   [phone] )  [place], [facility] 07/23/2016 1:25 PM

## 2016-07-23 NOTE — Progress Notes (Signed)
Discussed code status with pts wife and overall prognosis she stated she would like the pt to remain a Full Code for now.  She would like to see if he improves and is able to make his wishes known to her about code status.  However, she is agreeable to readdress code status if his condition worsens.  Marda Stalker, [place] Pager [phone] (please enter 7 digits) PCCM Consult Pager [phone] (please enter 7 digits)

## 2016-07-23 NOTE — Progress Notes (Signed)
Patient noticed to have labored breathing, using abdominal muscles. Lung sounds bilaterally expiratory wheezing. Oxygen saturations 98% on RA, patient confused and refuses bipap. Hinton Dyer NP notified and haldol ordered and patient placed on bipap. Will continue to monitor. [name]

## 2016-07-23 NOTE — Progress Notes (Signed)
Speech Language Pathology Treatment: Dysphagia  Patient Details Name: [name] MRN: [id number] DOB: [birth year] Today's Date: 07/23/2016 Time: 3202-3343 SLP Time Calculation (min) (ACUTE ONLY): 50 min  Assessment / Plan / Recommendation Clinical Impression  Pt appears at increased risk for aspiration d/t declined pulmonary and mental status at this time. With strict aspiration precautions in place, pt was able to tolerate single sip/bite trials w/ no immediate s/s of aspiration, no decline in baseline respiratory status. Noted O2 sats remained b/t 95-98%; vocal quality clear post trials. Pt required moderate verbal/tactile cues and support w/ the feeding of the po trials as well as monitoring of bolus size.    HPI        SLP Plan  Continue with current plan of care     Recommendations  Diet recommendations: Dysphagia 1 (puree);Thin liquid Liquids provided via: Cup;Straw Medication Administration: Crushed with puree Supervision: Staff to assist with self feeding;Full supervision/cueing for compensatory strategies Compensations: Minimize environmental distractions;Slow rate;Small sips/bites;Lingual sweep for clearance of pocketing;Follow solids with liquid Postural Changes and/or Swallow Maneuvers: Seated upright 90 degrees;Upright 30-60 min after meal (rest breaks)                General recommendations:  (Dietician f/u) Oral Care Recommendations: Oral care BID;Staff/trained caregiver to provide oral care Follow up Recommendations:  (TBD) Plan: Continue with current plan of care       Lupus, [place], CCC-SLP  Watson,Katherine 07/23/2016, 2:22 PM

## 2016-07-23 NOTE — Progress Notes (Signed)
[place] [facility] [place] NAME: [name]    MR#:  [id number]  DATE OF BIRTH:  Feb 19, 1946  SUBJECTIVE:  CHIEF COMPLAINT:   Chief Complaint  Patient presents with  . Hypotension   Still SOB. Confused.  REVIEW OF SYSTEMS:   Cannot get review of systems due to change in mental status  ROS  DRUG ALLERGIES:   Allergies  Allergen Reactions  . Other Other (See Comments)    Hydromet cough syrup-causes GI upset  . Codeine Nausea And Vomiting    VITALS:  Blood pressure 125/65, pulse 84, temperature 98 F (36.7 C), temperature source Oral, resp. rate (!) 41, height '5\' 7"'$  (1.702 m), weight 68.3 kg (150 lb 9.2 oz), SpO2 90 %.  PHYSICAL EXAMINATION:  GENERAL:  70 y.o.-year-old patient lying in the bed with acute distress.  EYES: Pupils equal, round, reactive to light and accommodation. No scleral icterus. Extraocular muscles intact. Conjunctiva pale. HEENT: Head atraumatic, normocephalic. Oropharynx and nasopharynx clear.  NECK:  Supple, no jugular venous distention. No thyroid enlargement, no tenderness.  LUNGS:  Positive expiratory wheezing,  crepitation.  use of accessory muscles of respiration.  CARDIOVASCULAR: S1, S2 normal. No murmurs, rubs, or gallops.  ABDOMEN: Soft, nontender, nondistended. Bowel sounds present. No organomegaly or mass.  EXTREMITIES: positive for pedal edema,no cyanosis, or clubbing. NEUROLOGIC: Moves all 4 extremities. PSYCHIATRIC: The patient is drowzy. SKIN: No obvious rash, lesion, or ulcer.   Physical Exam LABORATORY PANEL:   CBC  Recent Labs Lab 07/23/16 0309  WBC 12.0*  HGB 7.3*  HCT 22.2*  PLT 368   ------------------------------------------------------------------------------------------------------------------  Chemistries   Recent Labs Lab 07/23/16 0309  NA 131*  K 3.7  CL 102  CO2 18*  GLUCOSE 109*  BUN 7  CREATININE 0.70  CALCIUM 8.0*  AST 35  ALT 23  ALKPHOS 41  BILITOT 0.6    ------------------------------------------------------------------------------------------------------------------  Cardiac Enzymes  Recent Labs Lab 07/22/16 2131 07/23/16 0309  TROPONINI 0.07* 0.09*   ------------------------------------------------------------------------------------------------------------------  RADIOLOGY:  Dg Chest 2 View  Result Date: 07/22/2016 CLINICAL DATA:  Pneumonia.  Shortness of breath, weakness. EXAM: CHEST  2 VIEW COMPARISON:  07/20/2016 FINDINGS: Chronic changes are again noted throughout the left lung with cavitary area at the left lung base. This is stable when compared to prior study. Underlying COPD. Biapical scarring. No confluent opacities to the right. Heart is borderline in size. IMPRESSION: Stable COPD. Stable chronic changes throughout the left lung including cavitary area at the left lung base. No acute findings or change. Electronically Signed   By: Rolm Baptise M.D.   On: 07/22/2016 09:52   Dg Chest Port 1 View  Result Date: 07/23/2016 CLINICAL DATA:  Respiratory failure. EXAM: PORTABLE CHEST 1 VIEW COMPARISON:  07/22/2016, 04/29/2016.  CT 05/19/2016 FINDINGS: Mediastinum hilar structures are stable. Heart size stable. Postsurgical changes left lung. Stable chronic changes in the left lung consistent pleural parenchymal scarring. Stable cavitary lesion in the left lung base. Stable atelectatic changes and/or scarring right lung base. No pneumothorax . IMPRESSION: 1. Stable postsurgical changes and pleural-parenchymal scarring left lung. Stable cavitary lesion left lung base. 2. Stable subsegmental atelectasis and/or scarring right lung base. No acute abnormality identified. Chest is unchanged from prior exams. Electronically Signed   By: [place]   On: 07/23/2016 06:56    ASSESSMENT AND PLAN:   Principal Problem:   Acute on chronic respiratory failure ([employer]) Active Problems:   Adenocarcinoma of left lung ([place])  COPD mixed type  (HCC)   PNA (pneumonia)   Mycobacterium avium complex ([place])   Hyponatremia   Anemia   General weakness  70 year old male with past medical history of COPD, chronic respiratory failure, history of MAI infection, lung cancer, previous history of hyponatremia presents to the hospital due to weakness, difficulty walking poor by mouth intake and worsening lower extremity edema.  * Acute on chronic respiratory failure due to COPD exacerbation Could have bilateral pneumonitis IV abx Nebs  * Hyponatremia On fluid restriction  * Acute kidney injury-secondary to dehydration.  Resolved  * MAI infection- rifampin, ethambutol, Zithromax held while acutely ill - infectious disease on board  * Diarrhea-likely related to the patient's antibiotics. - c diff and Gi panel are negative.   * HTN -   anti-HTN held  * Ac on chronic anemia Transfuse if < 7  All the records are reviewed and case discussed with Care Management/Social Workerr. Management plans discussed with the patient, family and they are in agreement.  CODE STATUS: full  TOTAL TIME TAKING CARE OF THIS PATIENT: 35 MInutes  Discussed with Dr. Vale Haven M.D on 07/23/2016   Between 7am to 6pm - Pager - [phone]  After 6pm go to [email or web address] - password [email or web address] [place] [facility]  Office  [phone]  CC: Primary care physician; Asencion Noble, MD  Note: This dictation was prepared with Dragon dictation along with smaller phrase technology. Any transcriptional errors that result from this process are unintentional.

## 2016-07-23 NOTE — Progress Notes (Signed)
Per order foley inserted and precedex drip started. Patient very agitated when drip initiated, patient is currently resting. Will continue to assess. [name]

## 2016-07-23 NOTE — Progress Notes (Signed)
Pt having increased work of breath respiratory rate 35 to 40's diminished breath sounds throughout with severe agitation and delirium this is not pts baseline.  ABG obtained results reviewed will place pt on CIWA protocol for possible ETOH withdrawal and place order for foley catheter for potential urinary retention.  Marda Stalker, [place] Pager [phone] (please enter 7 digits) PCCM Consult Pager [phone] (please enter 7 digits)

## 2016-07-24 ENCOUNTER — Inpatient Hospital Stay: Payer: Medicare Other

## 2016-07-24 ENCOUNTER — Encounter
Admission: RE | Admit: 2016-07-24 | Discharge: 2016-07-24 | Disposition: A | Discharge status: Home / Self Care | Payer: Medicare Other | Source: Ambulatory Visit | Attending: Internal Medicine | Admitting: Internal Medicine

## 2016-07-24 DIAGNOSIS — J9600 Acute respiratory failure, unspecified whether with hypoxia or hypercapnia: Secondary | ICD-10-CM

## 2016-07-24 DIAGNOSIS — J181 Lobar pneumonia, unspecified organism: Secondary | ICD-10-CM

## 2016-07-24 DIAGNOSIS — E43 Unspecified severe protein-calorie malnutrition: Secondary | ICD-10-CM | POA: Insufficient documentation

## 2016-07-24 DIAGNOSIS — D62 Acute posthemorrhagic anemia: Secondary | ICD-10-CM

## 2016-07-24 LAB — COMPREHENSIVE METABOLIC PANEL
ALBUMIN: 2.2 g/dL — AB (ref 3.5–5.0)
ALK PHOS: 38 U/L (ref 38–126)
ALT: 22 U/L (ref 17–63)
ANION GAP: 12 (ref 5–15)
AST: 28 U/L (ref 15–41)
BUN: 6 mg/dL (ref 6–20)
CALCIUM: 8.1 mg/dL — AB (ref 8.9–10.3)
CHLORIDE: 106 mmol/L (ref 101–111)
CO2: 19 mmol/L — AB (ref 22–32)
CREATININE: 0.82 mg/dL (ref 0.61–1.24)
GFR calc Af Amer: >60 mL/min (ref >60)
GFR calc non Af Amer: >60 mL/min (ref >60)
GLUCOSE: 89 mg/dL (ref 65–99)
Potassium: 3.9 mmol/L (ref 3.5–5.1)
SODIUM: 137 mmol/L (ref 135–145)
Total Bilirubin: 0.5 mg/dL (ref 0.3–1.2)
Total Protein: 5.4 g/dL — ABNORMAL LOW (ref 6.5–8.1)

## 2016-07-24 LAB — HEMOGLOBIN AND HEMATOCRIT, BLOOD
HEMATOCRIT: 25.3 % — AB (ref 40.0–52.0)
HEMOGLOBIN: 8.6 g/dL — AB (ref 13.0–18.0)

## 2016-07-24 LAB — PROCALCITONIN: Procalcitonin: 0.13 ng/mL

## 2016-07-24 LAB — CBC
HCT: 21.3 % — ABNORMAL LOW (ref 40.0–52.0)
HEMOGLOBIN: 7 g/dL — AB (ref 13.0–18.0)
MCH: 28.5 pg (ref 26.0–34.0)
MCHC: 32.6 g/dL (ref 32.0–36.0)
MCV: 87.6 fL (ref 80.0–100.0)
Platelets: 352 10*3/uL (ref 150–440)
RBC: 2.43 MIL/uL — AB (ref 4.40–5.90)
RDW: 15.8 % — ABNORMAL HIGH (ref 11.5–14.5)
WBC: 10.1 10*3/uL (ref 3.8–10.6)

## 2016-07-24 LAB — PREPARE RBC (CROSSMATCH)

## 2016-07-24 LAB — URINE CULTURE: Culture: NO GROWTH

## 2016-07-24 LAB — ABO/RH: ABO/RH(D): A POS

## 2016-07-24 MED ORDER — SODIUM CHLORIDE 0.9 % IV SOLN
INTRAVENOUS | Status: DC
  Administered 2016-07-24: 09:00:00 via INTRAVENOUS

## 2016-07-24 MED ORDER — DEXTROSE IN LACTATED RINGERS 5 % IV SOLN
INTRAVENOUS | Status: DC
  Administered 2016-07-24 – 2016-07-27 (×4): via INTRAVENOUS

## 2016-07-24 MED ORDER — SODIUM CHLORIDE 0.9 % IV SOLN
Freq: Once | INTRAVENOUS | Status: AC
Start: 2016-07-24 — End: 2016-07-24
  Administered 2016-07-24: 09:00:00 via INTRAVENOUS

## 2016-07-24 MED ORDER — ALBUTEROL SULFATE (2.5 MG/3ML) 0.083% IN NEBU
2.5000 mg | INHALATION_SOLUTION | Freq: Four times a day (QID) | RESPIRATORY_TRACT | Status: DC
  Administered 2016-07-24 – 2016-07-29 (×19): 2.5 mg via RESPIRATORY_TRACT
  Filled 2016-07-24 (×20): qty 3

## 2016-07-24 MED ORDER — ALBUTEROL SULFATE (2.5 MG/3ML) 0.083% IN NEBU
2.5000 mg | INHALATION_SOLUTION | RESPIRATORY_TRACT | Status: DC | PRN
  Administered 2016-07-26 – 2016-07-27 (×2): 2.5 mg via RESPIRATORY_TRACT
  Filled 2016-07-24 (×2): qty 3

## 2016-07-24 MED ORDER — PANTOPRAZOLE SODIUM 40 MG PO TBEC
40.0000 mg | DELAYED_RELEASE_TABLET | Freq: Every day | ORAL | Status: DC
  Administered 2016-07-24 – 2016-07-25 (×2): 40 mg via ORAL
  Filled 2016-07-24 (×2): qty 1

## 2016-07-24 MED ORDER — CEFTRIAXONE SODIUM-DEXTROSE 1-3.74 GM-% IV SOLR
1.0000 g | INTRAVENOUS | Status: DC
  Administered 2016-07-25 – 2016-07-27 (×3): 1 g via INTRAVENOUS
  Filled 2016-07-24 (×4): qty 50

## 2016-07-24 MED ORDER — DEXMEDETOMIDINE HCL IN NACL 400 MCG/100ML IV SOLN
0.4000 ug/kg/h | INTRAVENOUS | Status: AC
  Administered 2016-07-24: 0.7 ug/kg/h via INTRAVENOUS
  Administered 2016-07-25: 1.1 ug/kg/h via INTRAVENOUS
  Administered 2016-07-25: 0.5 ug/kg/h via INTRAVENOUS
  Filled 2016-07-24 (×3): qty 100

## 2016-07-24 NOTE — Progress Notes (Signed)
PULMONARY / CRITICAL CARE MEDICINE   Name: [name] MRN: [id number] DOB: August 05, 1946    ADMISSION DATE:  07/20/2016 CONSULTATION DATE:  07/20/2016  REFERRING MD:  Dr. 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  He presented to [facility] ER on 10/23 with c/o worsening bilateral lower extremity edema, weakness, poor po intake, and difficulty with ambulation.  He was recently discharged 2-3 weeks ago after receiving treatment for a COPD flare.  Admitted via ER with hyponatremia, AKI, weakness. Transferred to ICU/SDU 10/25 with increased WOB and confusion.   REVIEW OF SYSTEMS: Positives in BOLD Gen: Denies fever, chills, weight change, fatigue, night sweats HEENT: Denies blurred vision, double vision, hearing loss, tinnitus, sinus congestion, rhinorrhea, sore throat, neck stiffness, dysphagia PULM: shortness of breath, cough, sputum production, hemoptysis, wheezing CV: Denies chest pain, edema, orthopnea, paroxysmal nocturnal dyspnea, palpitations GI: Denies abdominal pain, nausea, vomiting, diarrhea, hematochezia, melena, constipation, change in bowel habits GU: Denies dysuria, hematuria, polyuria, oliguria, urethral discharge Endocrine: Denies hot or cold intolerance, polyuria, polyphagia or appetite change Derm: Denies rash, dry skin, scaling or peeling skin change Heme: Denies easy bruising, bleeding, bleeding gums Neuro: Denies headache, numbness, weakness, slurred speech, loss of memory or consciousness    SUBJECTIVE:  Pt reports slight improvement in shortness of breath.  No other complaints at this time  VITAL SIGNS: BP 127/67   Pulse 93   Temp 98.7 F (37.1 C) (Axillary)   Resp (!) 28    Ht '5\' 7"'$  (1.702 m)   Wt 150 lb 9.2 oz (68.3 kg)   SpO2 100%   BMI 23.58 kg/m   HEMODYNAMICS:    VENTILATOR SETTINGS: FiO2 (%):  [21 %-28 %] 28 %  INTAKE / OUTPUT: I/O last 3 completed shifts: In: 391.3 [I.V.:241.3; IV Piggyback:150] Out: 260 [Urine:260]  PHYSICAL EXAMINATION: General: chronically ill appearing Caucasian male resting in bed Neuro:  Oriented to self and time, confused to situation and place, no focal deficits HEENT: supple, no JVD Cardiovascular: s1s2, RRR, no M/R/G Lungs:  Scattered rhonchi, diminished in LLL, tachypnea at rest, abdominal accessory muscle use noted Abdomen: +BS x4, soft, slightly tender, non distended Ext: Unna wraps present bilateral lower extremities, trace bilateral lower extremities pitting edema Skin: no rashes or lesions warm to touch  CXR: Stable postsurgical changes and pleural-parenchymal scarring left lung. Stable cavitary lesion left lung base. Stable subsegmental atelectasis and/or scarring right lung base. No acute abnormality identified  STUDIES:  [place] Lower Extremities 10/23>>negative Echo 10/23>>EF 65% to 70% CT of head 10/27>>negative  CULTURES: Urine 10/25>>negative Sputum>> GI panel 10/23>>negative Occult stool panel 10/26>>positive  ANTIBIOTICS: Ceftriaxone 10/25 >>   ASSESSMENT: COPD S/P resection for lung cancer Chronic pulmonary MAC-followed outpatient by ID Acute on chronic respiratory failure with hypoxemia Moderate respiratory distress AECOPD Delirium Acute on chronic anemia - secondary to gastrointestinal bleeding, likely lower>> Fecal Occult Blood positive Previous dx of iron deficiency anemia   PLAN: Continue to monitor in ICU/SDU today-10/27 Cont supplemental O2 as needed Cont PRN BiPAP Cont nebulized bronchodilators Cont empiric ceftriaxone - ID following SCDs Transfusion threshold of Hgb < 7.0 Trasfuse 1 unit pRBC's Recheck H&H 1 hour post transfusion Will place order for GI consult if pt  continues to show  s/sx of bleeding or if anemia persist-10/27 Low dose PRN haloperidol Continue Precedex gtt for delirium DC Pepcid, Ativan, and ipratropium as possible contributors to delirium Chest physiotherapy q4h Encourage PO intake as tolerated Frequent reorientation Lights on during the day Promote family presence at bedside   Marda Stalker, [place] Pager [phone] (please enter 7 digits) [place] Pager [phone] (please enter 7 digits)  PCCM ATTENDING ATTESTATION:  I have evaluated patient with the APP [place] reviewed database in its entirety and discussed care plan in detail. In addition, this patient was discussed on multidisciplinary rounds.   Important exam findings: Poorly oriented, no overt respiratory distress Few scattered wheezes  Hgb 7.0  Major problems addressed by PCCM team: Acute on chronic respiratory failure Suspected PNA H/O Lung cancer Pulmonary MAIC Delirium - unclear etiology Acute blood loss anemia  PLAN/REC: As above. Plan discussed in detail on multidisciplinary rounds and is as outlined above.    Merton Border, MD PCCM service Mobile [phone] Pager [phone]

## 2016-07-24 NOTE — Progress Notes (Signed)
MD Sudini rounded at bedside and I notified MD that urine output seemed low after placement of foley catheter. Per documentation urine output had been 250 mLs since placement of foley. Will continue to monitor patient.

## 2016-07-24 NOTE — Progress Notes (Signed)
[facility] [place] NAME: [name]    MR#:  [id number]  DATE OF BIRTH:  December 08, 1945  SUBJECTIVE:  CHIEF COMPLAINT:   Chief Complaint  Patient presents with  . Hypotension   Still SOB. Confused. On Precedex drip. on BiPAP overnight.  REVIEW OF SYSTEMS:   Cannot get review of systems due to change in mental status  ROS  DRUG ALLERGIES:   Allergies  Allergen Reactions  . Other Other (See Comments)    Hydromet cough syrup-causes GI upset  . Codeine Nausea And Vomiting    VITALS:  Blood pressure 118/63, pulse 91, temperature 98.7 F (37.1 C), temperature source Axillary, resp. rate (!) 27, height '5\' 7"'$  (1.702 m), weight 68.3 kg (150 lb 9.2 oz), SpO2 98 %.  PHYSICAL EXAMINATION:  GENERAL:  70 y.o.-year-old patient lying in the bed with acute distress.  EYES: Pupils equal, round, reactive to light and accommodation. No scleral icterus. Extraocular muscles intact. Conjunctiva pale. HEENT: Head atraumatic, normocephalic. Oropharynx and nasopharynx clear.  NECK:  Supple, no jugular venous distention. No thyroid enlargement, no tenderness.  LUNGS:  Clear to auscultation with poor air entry. Right base rhonchi.  use of accessory muscles of respiration.  CARDIOVASCULAR: S1, S2 normal. No murmurs, rubs, or gallops.  ABDOMEN: Soft, nontender, nondistended. Bowel sounds present. No organomegaly or mass.  EXTREMITIES: positive for pedal edema,no cyanosis, or clubbing. NEUROLOGIC: Moves all 4 extremities. PSYCHIATRIC: The patient is drowzy.  Physical Exam LABORATORY PANEL:   CBC  Recent Labs Lab 07/24/16 0435  WBC 10.1  HGB 7.0*  HCT 21.3*  PLT 352   ------------------------------------------------------------------------------------------------------------------  Chemistries   Recent Labs Lab 07/24/16 0435  NA 137  K 3.9  CL 106  CO2 19*  GLUCOSE 89  BUN 6  CREATININE 0.82  CALCIUM 8.1*  AST 28  ALT 22  ALKPHOS 38   BILITOT 0.5   ------------------------------------------------------------------------------------------------------------------  Cardiac Enzymes  Recent Labs Lab 07/22/16 2131 07/23/16 0309  TROPONINI 0.07* 0.09*   ------------------------------------------------------------------------------------------------------------------  RADIOLOGY:  Dg Chest 2 View  Result Date: 07/22/2016 CLINICAL DATA:  Pneumonia.  Shortness of breath, weakness. EXAM: CHEST  2 VIEW COMPARISON:  07/20/2016 FINDINGS: Chronic changes are again noted throughout the left lung with cavitary area at the left lung base. This is stable when compared to prior study. Underlying COPD. Biapical scarring. No confluent opacities to the right. Heart is borderline in size. IMPRESSION: Stable COPD. Stable chronic changes throughout the left lung including cavitary area at the left lung base. No acute findings or change. Electronically Signed   By: Rolm Baptise M.D.   On: 07/22/2016 09:52   Dg Chest Port 1 View  Result Date: 07/24/2016 CLINICAL DATA:  Acute respiratory failure. EXAM: PORTABLE CHEST 1 VIEW COMPARISON:  07/23/2016, 06/28/2016 and chest CT 06/28/2016 FINDINGS: Lungs demonstrate stable volume loss of the left lung as well as stable mixed interstitial airspace density over the left lung worse over the left base. Cavitary lesion again seen over the left base containing more air as air-fluid level within the cavitary lesion nearly resolved. Stable interstitial changes right base. Remainder of the exam is unchanged. IMPRESSION: Stable changes within the lungs. Known cavitary lesion in the left base demonstrates more air with resolving air-fluid level. Electronically Signed   By: Marin Olp M.D.   On: 07/24/2016 07:43   Dg Chest Port 1 View  Result Date: 07/23/2016 CLINICAL DATA:  Respiratory failure. EXAM: PORTABLE CHEST 1 VIEW COMPARISON:  07/22/2016, 04/29/2016.  CT 05/19/2016 FINDINGS: Mediastinum hilar  structures are stable. Heart size stable. Postsurgical changes left lung. Stable chronic changes in the left lung consistent pleural parenchymal scarring. Stable cavitary lesion in the left lung base. Stable atelectatic changes and/or scarring right lung base. No pneumothorax . IMPRESSION: 1. Stable postsurgical changes and pleural-parenchymal scarring left lung. Stable cavitary lesion left lung base. 2. Stable subsegmental atelectasis and/or scarring right lung base. No acute abnormality identified. Chest is unchanged from prior exams. Electronically Signed   By: Marcello Moores  Register   On: 07/23/2016 06:56    ASSESSMENT AND PLAN:   Principal Problem:   Acute respiratory failure (HCC) Active Problems:   Adenocarcinoma of left lung (HCC)   COPD mixed type (HCC)   PNA (pneumonia)   Mycobacterium avium complex (HCC)   Hyponatremia   Anemia   General weakness   Respiratory failure (HCC)   Protein-calorie malnutrition, severe  70 year old male with past medical history of COPD, chronic respiratory failure, history of MAI infection, lung cancer, previous history of hyponatremia presents to the hospital due to weakness, difficulty walking poor by mouth intake and worsening lower extremity edema.  * Acute on chronic anemia Likely chronic blood loss.  Colonoscopy in December 2016 showed Impression:   No evidence of recurrent polyps.  Mild sigmoid colon diverticulosis.  Small external hemorrhoids  Transfuse 1 unit packed RBC. Consult GI. Monitor hemoglobin.  * Acute on chronic respiratory failure due to COPD exacerbation Could have bilateral pneumonitis IV abx Nebs  * Hyponatremia - Resolved  * Acute kidney injury-secondary to dehydration.  Resolved  * MAI infection- rifampin, ethambutol, Zithromax held while acutely ill - infectious disease on board  * Diarrhea-likely related to the patient's antibiotics. - C diff and Gi panel are negative.   * HTN -   anti-HTN  held. Metoprolol restarted  Discussed with wife regarding patient's respiratory status. Anemia and need for blood transfusion. Consent obtained.  CODE STATUS: full  TOTAL CC TIME TAKING CARE OF THIS PATIENT: 16 Minutes  Discussed with Dr. Vale Haven M.D on 07/24/2016   Between 7am to 6pm - Pager - [phone]  After 6pm go to [email or web address] - password [email or web address] [facility]  Office  [phone]  CC: Primary care physician; Asencion Noble, MD  Note: This dictation was prepared with Dragon dictation along with smaller phrase technology. Any transcriptional errors that result from this process are unintentional.

## 2016-07-24 NOTE — Progress Notes (Signed)
[name] INFECTIOUS DISEASE PROGRESS NOTE Date of Admission:  07/20/2016     ID: [name] is a 70 y.o. male with hPrincipal Problem:   Acute respiratory failure (HCC) Active Problems:   Adenocarcinoma of left lung (HCC)   COPD mixed type (HCC)   PNA (pneumonia)   Mycobacterium avium complex ([place])   Hyponatremia   Anemia   General weakness   Respiratory failure ([place])   Protein-calorie malnutrition, severe  Subjective: No fevers,  Still in unit  ROS  Eleven systems are reviewed and negative except per hpi  Medications:  Antibiotics Given (last 72 hours)    Date/Time Action Medication Dose Rate   07/22/16 0645 Given   cefTRIAXone (ROCEPHIN) IVPB 1 g 1 g 100 mL/hr   07/23/16 0612 Given   cefTRIAXone (ROCEPHIN) IVPB 1 g 1 g 100 mL/hr   07/24/16 0555 Given   cefTRIAXone (ROCEPHIN) IVPB 1 g 1 g 100 mL/hr     . albuterol  2.5 mg Nebulization Q6H  . aspirin EC  81 mg Oral Daily  . atorvastatin  10 mg Oral q1800  . budesonide (PULMICORT) nebulizer solution  0.25 mg Nebulization Q6H  . [START ON 07/25/2016] cefTRIAXone  1 g Intravenous Q24H  . feeding supplement (ENSURE ENLIVE)  237 mL Oral BID BM  . pantoprazole  40 mg Oral Daily    Objective: Vital signs in last 24 hours: Temp:  [97.6 F (36.4 C)-99 F (37.2 C)] 98.6 F (37 C) (10/27 1402) Pulse Rate:  [73-102] 91 (10/27 1402) Resp:  [20-37] 25 (10/27 1402) BP: (95-133)/(44-86) 110/44 (10/27 1402) SpO2:  [94 %-100 %] 98 % (10/27 1402) FiO2 (%):  [28 %] 28 % (10/27 0242) Constitutional: He is oriented to person, place, and time. Frail, in resp distress HENT: anicteric Mouth/Throat: Oropharynx is clear and dry . No oropharyngeal exudate.  Cardiovascular: Normal rate, regular rhythm and normal heart sounds. E Pulmonary/Chest: tachypneic, very poor air movement, on O2 Abdominal: Soft. Bowel sounds are normal. He exhibits no distension. There is no tenderness.  Lymphadenopathy: He has no cervical adenopathy.   Neurological: He is alert and oriented to person, place, and time.  Skin: bil LE wrapped in unnawrap  Lab Results  Recent Labs  07/23/16 0309 07/24/16 0435 07/24/16 1506  WBC 12.0* 10.1  --   HGB 7.3* 7.0* 8.6*  HCT 22.2* 21.3* 25.3*  NA 131* 137  --   K 3.7 3.9  --   CL 102 106  --   CO2 18* 19*  --   BUN 7 6  --   CREATININE 0.70 0.82  --     Microbiology: Results for orders placed or performed during the hospital encounter of 07/20/16  Gastrointestinal Panel by PCR , Stool     Status: None   Collection Time: 07/20/16  6:40 PM  Result Value Ref Range Status   Campylobacter species NOT DETECTED NOT DETECTED Final   Plesimonas shigelloides NOT DETECTED NOT DETECTED Final   Salmonella species NOT DETECTED NOT DETECTED Final   Yersinia enterocolitica NOT DETECTED NOT DETECTED Final   Vibrio species NOT DETECTED NOT DETECTED Final   Vibrio cholerae NOT DETECTED NOT DETECTED Final   Enteroaggregative E coli (EAEC) NOT DETECTED NOT DETECTED Final   Enteropathogenic E coli (EPEC) NOT DETECTED NOT DETECTED Final   Enterotoxigenic E coli (ETEC) NOT DETECTED NOT DETECTED Final   Shiga like toxin producing E coli (STEC) NOT DETECTED NOT DETECTED Final   Shigella/Enteroinvasive E coli (EIEC)  NOT DETECTED NOT DETECTED Final   Cryptosporidium NOT DETECTED NOT DETECTED Final   Cyclospora cayetanensis NOT DETECTED NOT DETECTED Final   Entamoeba histolytica NOT DETECTED NOT DETECTED Final   Giardia lamblia NOT DETECTED NOT DETECTED Final   Adenovirus F40/41 NOT DETECTED NOT DETECTED Final   Astrovirus NOT DETECTED NOT DETECTED Final   Norovirus GI/GII NOT DETECTED NOT DETECTED Final   Rotavirus A NOT DETECTED NOT DETECTED Final   Sapovirus (I, II, IV, and V) NOT DETECTED NOT DETECTED Final  C difficile quick scan w PCR reflex     Status: None   Collection Time: 07/20/16  6:40 PM  Result Value Ref Range Status   C Diff antigen NEGATIVE NEGATIVE Final   C Diff toxin NEGATIVE  NEGATIVE Final   C Diff interpretation No C. difficile detected.  Final  Urine culture     Status: None   Collection Time: 07/22/16 12:45 PM  Result Value Ref Range Status   Specimen Description URINE, RANDOM  Final   Special Requests NONE  Final   Culture NO GROWTH Performed at [facility]   Final   Report Status 07/24/2016 FINAL  Final  MRSA PCR Screening     Status: None   Collection Time: 07/22/16  2:22 PM  Result Value Ref Range Status   MRSA by PCR NEGATIVE NEGATIVE Final    Comment:        The GeneXpert MRSA Assay (FDA approved for NASAL specimens only), is one component of a comprehensive MRSA colonization surveillance program. It is not intended to diagnose MRSA infection nor to guide or monitor treatment for MRSA infections.      Studies/Results: Ct Head Wo Contrast  Result Date: 07/24/2016 CLINICAL DATA:  Acute change in mental status. Acute encephalopathy. History of lung cancer and COPD. EXAM: CT HEAD WITHOUT CONTRAST TECHNIQUE: Contiguous axial images were obtained from the base of the skull through the vertex without intravenous contrast. COMPARISON:  None. FINDINGS: Brain: The ventricles, cisterns and other CSF spaces are within normal. There is minimal age related atrophic change present. There is mild chronic ischemic microvascular disease. There is no mass, mass effect, shift midline structures or acute hemorrhage. No evidence of acute infarction. Vascular: Minimal calcified plaque over the cavernous segment of the internal carotid arteries. Skull: Within normal. Sinuses/Orbits: Within normal. IMPRESSION: No acute intracranial findings. Mild chronic ischemic microvascular disease and minimal age related atrophic change. Electronically Signed   By: Marin Olp M.D.   On: 07/24/2016 09:18   Dg Chest Port 1 View  Result Date: 07/24/2016 CLINICAL DATA:  Acute respiratory failure. EXAM: PORTABLE CHEST 1 VIEW COMPARISON:  07/23/2016, 06/28/2016 and chest  CT 06/28/2016 FINDINGS: Lungs demonstrate stable volume loss of the left lung as well as stable mixed interstitial airspace density over the left lung worse over the left base. Cavitary lesion again seen over the left base containing more air as air-fluid level within the cavitary lesion nearly resolved. Stable interstitial changes right base. Remainder of the exam is unchanged. IMPRESSION: Stable changes within the lungs. Known cavitary lesion in the left base demonstrates more air with resolving air-fluid level. Electronically Signed   By: Marin Olp M.D.   On: 07/24/2016 07:43   Dg Chest Port 1 View  Result Date: 07/23/2016 CLINICAL DATA:  Respiratory failure. EXAM: PORTABLE CHEST 1 VIEW COMPARISON:  07/22/2016, 04/29/2016.  CT 05/19/2016 FINDINGS: Mediastinum hilar structures are stable. Heart size stable. Postsurgical changes left lung. Stable chronic changes in the  left lung consistent pleural parenchymal scarring. Stable cavitary lesion in the left lung base. Stable atelectatic changes and/or scarring right lung base. No pneumothorax . IMPRESSION: 1. Stable postsurgical changes and pleural-parenchymal scarring left lung. Stable cavitary lesion left lung base. 2. Stable subsegmental atelectasis and/or scarring right lung base. No acute abnormality identified. Chest is unchanged from prior exams. Electronically Signed   By: Marcello Moores  Register   On: 07/23/2016 06:56    Assessment/Plan: [name] is a 70 y.o. male with chronic MAI, on treatment for about a month (rif,, etham and azithro). He also has advance COPD and follows with [facility]. He had been seen 10/10 with increasing edema- I had him stop chlorthalidone and start lasix and he was to fu cards and get echo. Admitted now with worsening edema, increased cr and chronic hyponatremia.   Overall he appears weaker and has some chronic nausea with the triple abx. 10/24 we stopped his triple therapy. Developed worsening copd and now in  unit   Recommendations Check sputum cx Would recommoned holding the triple therapy for about 10 days This may give him time to recover his strength as treatment for MAI can be difficult to tolerate.   Thank you very much for the consult. Will follow with you.  [place], Leoma Folds P   07/24/2016, 3:43 PM

## 2016-07-24 NOTE — Clinical Social Work Note (Signed)
CSW updated [facility] regarding patient transferring to ICU. Currently [facility] is still able to take patient for rehab. Shela Leff MSW,LCSW [phone]

## 2016-07-25 ENCOUNTER — Inpatient Hospital Stay: Payer: Medicare Other

## 2016-07-25 DIAGNOSIS — J690 Pneumonitis due to inhalation of food and vomit: Secondary | ICD-10-CM

## 2016-07-25 LAB — CBC
HEMATOCRIT: 24.7 % — AB (ref 40.0–52.0)
Hemoglobin: 8.3 g/dL — ABNORMAL LOW (ref 13.0–18.0)
MCH: 29.1 pg (ref 26.0–34.0)
MCHC: 33.6 g/dL (ref 32.0–36.0)
MCV: 86.6 fL (ref 80.0–100.0)
PLATELETS: 354 10*3/uL (ref 150–440)
RBC: 2.85 MIL/uL — AB (ref 4.40–5.90)
RDW: 16 % — AB (ref 11.5–14.5)
WBC: 10 10*3/uL (ref 3.8–10.6)

## 2016-07-25 LAB — TYPE AND SCREEN
ABO/RH(D): A POS
Antibody Screen: NEGATIVE
Unit division: 0

## 2016-07-25 LAB — BASIC METABOLIC PANEL
Anion gap: 8 (ref 5–15)
BUN: 7 mg/dL (ref 6–20)
CHLORIDE: 108 mmol/L (ref 101–111)
CO2: 20 mmol/L — AB (ref 22–32)
CREATININE: 0.79 mg/dL (ref 0.61–1.24)
Calcium: 8.3 mg/dL — ABNORMAL LOW (ref 8.9–10.3)
GFR calc Af Amer: >60 mL/min (ref >60)
GFR calc non Af Amer: >60 mL/min (ref >60)
Glucose, Bld: 141 mg/dL — ABNORMAL HIGH (ref 65–99)
POTASSIUM: 3.3 mmol/L — AB (ref 3.5–5.1)
SODIUM: 136 mmol/L (ref 135–145)

## 2016-07-25 LAB — PH, GASTRIC FLUID (GASTROCCULT CARD): pH, Gastric: 3

## 2016-07-25 LAB — HEMOGLOBIN AND HEMATOCRIT, BLOOD
HCT: 24 % — ABNORMAL LOW (ref 40.0–52.0)
HEMOGLOBIN: 8.2 g/dL — AB (ref 13.0–18.0)

## 2016-07-25 LAB — PROCALCITONIN: Procalcitonin: 0.13 ng/mL

## 2016-07-25 LAB — OCCULT BLOOD X 1 CARD TO LAB, STOOL: FECAL OCCULT BLD: POSITIVE — AB

## 2016-07-25 LAB — TSH: TSH: 0.65 u[IU]/mL (ref 0.350–4.500)

## 2016-07-25 MED ORDER — POTASSIUM CHLORIDE 10 MEQ/100ML IV SOLN: 10.0000 meq | INTRAVENOUS | Status: DC

## 2016-07-25 MED ORDER — CALCIUM CARBONATE ANTACID 500 MG PO CHEW
1.0000 | CHEWABLE_TABLET | Freq: Three times a day (TID) | ORAL | Status: DC
  Administered 2016-07-25 – 2016-07-29 (×7): 200 mg via ORAL
  Filled 2016-07-25 (×7): qty 1

## 2016-07-25 MED ORDER — PANTOPRAZOLE SODIUM 40 MG IV SOLR
40.0000 mg | Freq: Two times a day (BID) | INTRAVENOUS | Status: DC
  Administered 2016-07-25 – 2016-07-26 (×3): 40 mg via INTRAVENOUS
  Filled 2016-07-25 (×3): qty 40

## 2016-07-25 MED ORDER — POTASSIUM CHLORIDE 2 MEQ/ML IV SOLN
INTRAVENOUS | Status: AC
  Administered 2016-07-25: 11:00:00 via INTRAVENOUS
  Filled 2016-07-25: qty 500

## 2016-07-25 NOTE — Progress Notes (Signed)
[facility]  ROUNDING NOTE   Subjective:  Patient resting comfortably in bed. Serum sodium low normal at 136. Potassium low at 3.3. This was discussed with nursing and we plan to administer the patient potassium chloride 40 mCi IV 1. Patient still has a very poor appetite.  Objective:  Vital signs in last 24 hours:  Temp:  [97.6 F (36.4 C)-98.6 F (37 C)] 98.6 F (37 C) (10/28 0735) Pulse Rate:  [73-100] 75 (10/28 0735) Resp:  [20-37] 24 (10/28 0735) BP: (83-143)/(38-91) 96/47 (10/28 0735) SpO2:  [92 %-100 %] 96 % (10/28 0736)  Weight change:  Filed Weights   07/20/16 1039 07/20/16 1545 07/22/16 1424  Weight: 74.7 kg (164 lb 9.6 oz) 74.4 kg (164 lb) 68.3 kg (150 lb 9.2 oz)    Intake/Output: I/O last 3 completed shifts: In: 2571.2 [P.O.:730; I.V.:1481.2; Blood:360] Out: 865 [Urine:865]   Intake/Output this shift:  Total I/O In: 65.4 [I.V.:65.4] Out: 20 [Urine:20]  Physical Exam: General: No acute distress   Head: Normocephalic, atraumatic. Moist oral mucosal membranes  Eyes: Anicteric  Neck: Supple, trachea midline  Lungs:  Mild bilateral wheezing, normal effort   Heart: Regular rate and rhythm  Abdomen:  Soft, nontender, obese  Extremities: 1+ bilateral lower extremity edema   Neurologic: Nonfocal, moving all four extremities  Skin: No lesions       Basic Metabolic Panel:  Recent Labs Lab 07/21/16 0522 07/22/16 0523 07/23/16 0309 07/24/16 0435 07/25/16 0418  NA 128* 131* 131* 137 136  K 3.2* 4.0 3.7 3.9 3.3*  CL 100* 105 102 106 108  CO2 19* 18* 18* 19* 20*  GLUCOSE 95 88 109* 89 141*  BUN '12 7 7 6 7  '$ CREATININE 1.05 0.79 0.70 0.82 0.79  CALCIUM 7.5* 8.2* 8.0* 8.1* 8.3*    Liver Function Tests:  Recent Labs Lab 07/20/16 1032 07/23/16 0309 07/24/16 0435  AST 28 35 28  ALT '20 23 22  '$ ALKPHOS 48 41 38  BILITOT 0.5 0.6 0.5  PROT 6.5 5.6* 5.4*  ALBUMIN 3.0* 2.4* 2.2*   No results for input(s): LIPASE, AMYLASE in the last  168 hours.  Recent Labs Lab 07/23/16 1833  AMMONIA 22    CBC:  Recent Labs Lab 07/21/16 1130 07/22/16 0523 07/23/16 0309 07/24/16 0435 07/24/16 1506 07/25/16 0418  WBC 11.3* 10.8* 12.0* 10.1  --  10.0  HGB 7.2* 7.8* 7.3* 7.0* 8.6* 8.3*  HCT 21.4* 23.9* 22.2* 21.3* 25.3* 24.7*  MCV 84.1 86.7 85.3 87.6  --  86.6  PLT 345 354 368 352  --  354    Cardiac Enzymes:  Recent Labs Lab 07/20/16 1032 07/22/16 1443 07/22/16 2131 07/23/16 0309  TROPONINI <0.03 0.11* 0.07* 0.09*    BNP: Invalid input(s): POCBNP  CBG:  Recent Labs Lab 07/21/16 0028 07/22/16 1427 07/23/16 1843  GLUCAP 105* 108* 106    Microbiology: Results for orders placed or performed during the hospital encounter of 07/20/16  Gastrointestinal Panel by PCR , Stool     Status: None   Collection Time: 07/20/16  6:40 PM  Result Value Ref Range Status   Campylobacter species NOT DETECTED NOT DETECTED Final   Plesimonas shigelloides NOT DETECTED NOT DETECTED Final   Salmonella species NOT DETECTED NOT DETECTED Final   Yersinia enterocolitica NOT DETECTED NOT DETECTED Final   Vibrio species NOT DETECTED NOT DETECTED Final   Vibrio cholerae NOT DETECTED NOT DETECTED Final   Enteroaggregative E coli (EAEC) NOT DETECTED NOT DETECTED Final  Enteropathogenic E coli (EPEC) NOT DETECTED NOT DETECTED Final   Enterotoxigenic E coli (ETEC) NOT DETECTED NOT DETECTED Final   Shiga like toxin producing E coli (STEC) NOT DETECTED NOT DETECTED Final   Shigella/Enteroinvasive E coli (EIEC) NOT DETECTED NOT DETECTED Final   Cryptosporidium NOT DETECTED NOT DETECTED Final   Cyclospora cayetanensis NOT DETECTED NOT DETECTED Final   Entamoeba histolytica NOT DETECTED NOT DETECTED Final   Giardia lamblia NOT DETECTED NOT DETECTED Final   Adenovirus F40/41 NOT DETECTED NOT DETECTED Final   Astrovirus NOT DETECTED NOT DETECTED Final   Norovirus GI/GII NOT DETECTED NOT DETECTED Final   Rotavirus A NOT DETECTED NOT  DETECTED Final   Sapovirus (I, II, IV, and V) NOT DETECTED NOT DETECTED Final  C difficile quick scan w PCR reflex     Status: None   Collection Time: 07/20/16  6:40 PM  Result Value Ref Range Status   C Diff antigen NEGATIVE NEGATIVE Final   C Diff toxin NEGATIVE NEGATIVE Final   C Diff interpretation No C. difficile detected.  Final  Urine culture     Status: None   Collection Time: 07/22/16 12:45 PM  Result Value Ref Range Status   Specimen Description URINE, RANDOM  Final   Special Requests NONE  Final   Culture NO GROWTH Performed at [facility]   Final   Report Status 07/24/2016 FINAL  Final  MRSA PCR Screening     Status: None   Collection Time: 07/22/16  2:22 PM  Result Value Ref Range Status   MRSA by PCR NEGATIVE NEGATIVE Final    Comment:        The GeneXpert MRSA Assay (FDA approved for NASAL specimens only), is one component of a comprehensive MRSA colonization surveillance program. It is not intended to diagnose MRSA infection nor to guide or monitor treatment for MRSA infections.     Coagulation Studies:  Recent Labs  07/22/16 1524  LABPROT 14.3  INR 1.11    Urinalysis:  Recent Labs  07/22/16 1245 07/23/16 1828  COLORURINE YELLOW* YELLOW*  LABSPEC 1.011 1.014  PHURINE 5.0 5.0  GLUCOSEU NEGATIVE NEGATIVE  HGBUR 1+* 1+*  BILIRUBINUR NEGATIVE NEGATIVE  KETONESUR 1+* 1+*  PROTEINUR 30* 30*  NITRITE NEGATIVE NEGATIVE  LEUKOCYTESUR NEGATIVE NEGATIVE      Imaging: Ct Head Wo Contrast  Result Date: 07/24/2016 CLINICAL DATA:  Acute change in mental status. Acute encephalopathy. History of lung cancer and COPD. EXAM: CT HEAD WITHOUT CONTRAST TECHNIQUE: Contiguous axial images were obtained from the base of the skull through the vertex without intravenous contrast. COMPARISON:  None. FINDINGS: Brain: The ventricles, cisterns and other CSF spaces are within normal. There is minimal age related atrophic change present. There is mild chronic  ischemic microvascular disease. There is no mass, mass effect, shift midline structures or acute hemorrhage. No evidence of acute infarction. Vascular: Minimal calcified plaque over the cavernous segment of the internal carotid arteries. Skull: Within normal. Sinuses/Orbits: Within normal. IMPRESSION: No acute intracranial findings. Mild chronic ischemic microvascular disease and minimal age related atrophic change. Electronically Signed   By: Marin Olp M.D.   On: 07/24/2016 09:18   Dg Chest Port 1 View  Result Date: 07/24/2016 CLINICAL DATA:  Acute respiratory failure. EXAM: PORTABLE CHEST 1 VIEW COMPARISON:  07/23/2016, 06/28/2016 and chest CT 06/28/2016 FINDINGS: Lungs demonstrate stable volume loss of the left lung as well as stable mixed interstitial airspace density over the left lung worse over the left base. Cavitary  lesion again seen over the left base containing more air as air-fluid level within the cavitary lesion nearly resolved. Stable interstitial changes right base. Remainder of the exam is unchanged. IMPRESSION: Stable changes within the lungs. Known cavitary lesion in the left base demonstrates more air with resolving air-fluid level. Electronically Signed   By: Marin Olp M.D.   On: 07/24/2016 07:43     Medications:   . dexmedetomidine 0.7 mcg/kg/hr (07/25/16 0810)  . dextrose 5% lactated ringers 50 mL/hr at 07/25/16 0700   . albuterol  2.5 mg Nebulization Q6H  . aspirin EC  81 mg Oral Daily  . atorvastatin  10 mg Oral q1800  . budesonide (PULMICORT) nebulizer solution  0.25 mg Nebulization Q6H  . cefTRIAXone  1 g Intravenous Q24H  . feeding supplement (ENSURE ENLIVE)  237 mL Oral BID BM  . pantoprazole  40 mg Oral Daily  . potassium chloride  10 mEq Intravenous Q1 Hr x 4   acetaminophen **OR** [DISCONTINUED] acetaminophen, albuterol, [DISCONTINUED] ondansetron **OR** ondansetron (ZOFRAN) IV  Assessment/ Plan:  Mr. ROI JAFARI is a 70 y.o. white male with history  of lung cancer, DVT, pulmonary hypertension, mycobacterium infection, chronic hyponatremia, anemia, pneumothorax, and COPD, who was admitted to [facility] on 07/20/2016   1. Hyponatremia: hypervolumic. Hypo-osmolar - improved - Serum sodium currently 136. He remains on low rate lactated ringers as his appetite is poor.  2. Hypokalemia: also due to poor PO intake and NS -  potassium down to 3.3. We will administer potassium chloride 40 mEq IV 1 today.  3. Edema: with bilateral venous insufficiency and hypoalbuminemia - Patient was previously receiving leg wraps. Continue to monitor this clinically.  4. Metabolic Acidosis: Serum bicarbonate currently 20. Continue to monitor. We may need to consider adding sodium bicarbonate tablets.  5. Anemia: Hemoglobin relatively stable. Currently 8.3. Continue to monitor.   LOS: 5 Jayvon Mounger 10/28/20179:01 AM

## 2016-07-25 NOTE — Consult Note (Signed)
Reason for Consult:confusion  Referring Physician: Dr. Darvin Neighbours   CC: confusion   HPI: [name] is an 70 y.o. male with past medical history of COPD, chronic respiratory failure, history of MAI infection, lung cancer, previous history of hyponatremia presents to the hospital due to weakness, difficulty walking poor by mouth intake and worsening lower extremity edema admitted for acute respiratory failure, needing non invasive ventilation.  Neurology consulted for delirium.    Past Medical History:  Diagnosis Date  . Acute MI   . Arthritis of lumbar spine ([place])   . Asthma   . Clotting disorder ([place])   . Collapsed lung    x3  . COPD (chronic obstructive pulmonary disease) ([place])   . Dyspnea   . History of benign thyroid tumor   . Lung cancer ([place])   . Mycobacterium avium complex ([place]) 06/24/2016  . Pneumonia   . Pneumothorax   . Small cell carcinoma of lung ([place])   . Spontaneous pneumothorax     Past Surgical History:  Procedure Laterality Date  . APPENDECTOMY    . benign thryoid nodule resected    . CARDIAC CATHETERIZATION  2013   [facility]  . CHEST TUBE INSERTION    . COLONOSCOPY N/A 08/30/2015   Procedure: COLONOSCOPY;  Surgeon: Rogene Houston, MD;  Location: [facility];  Service: Endoscopy;  Laterality: N/A;  1:45  . left upper lobectomy for small cell lung cancer    . VATS R thoracotomy      Family History  Problem Relation Age of Onset  . Heart disease Mother   . Colon cancer Father   . Heart failure Brother     Social History:  reports that he quit smoking about 24 years ago. His smoking use included Cigars. He has a 90.00 pack-year smoking history. He has never used smokeless tobacco. He reports that he does not drink alcohol or use drugs.  Allergies  Allergen Reactions  . Other Other (See Comments)    Hydromet cough syrup-causes GI upset  . Codeine Nausea And Vomiting    Medications: I have reviewed the patient's current medications.  ROS: History  obtained from the patient and pt's wife at bedside  General ROS: negative for - chills, fatigue, fever, night sweats, weight gain or weight loss Psychological ROS: negative for - behavioral disorder, hallucinations, memory difficulties, mood swings or suicidal ideation Ophthalmic ROS: negative for - blurry vision, double vision, eye pain or loss of vision ENT ROS: negative for - epistaxis, nasal discharge, oral lesions, sore throat, tinnitus or vertigo Allergy and Immunology ROS: negative for - hives or itchy/watery eyes Hematological and Lymphatic ROS: negative for - bleeding problems, bruising or swollen lymph nodes Endocrine ROS: negative for - galactorrhea, hair pattern changes, polydipsia/polyuria or temperature intolerance Respiratory ROS: negative for - hx of respiratory failure, COPD Cardiovascular ROS: negative for - chest pain, dyspnea on exertion, edema or irregular heartbeat Gastrointestinal ROS: negative for - abdominal pain, diarrhea, hematemesis, nausea/vomiting or stool incontinence Genito-Urinary ROS: negative for - dysuria, hematuria, incontinence or urinary frequency/urgency Musculoskeletal ROS: negative for - joint swelling or muscular weakness Neurological ROS: as noted in HPI Dermatological ROS: negative for rash and skin lesion changes  Physical Examination: Blood pressure (!) 112/52, pulse 80, temperature 98.6 F (37 C), temperature source Oral, resp. rate (!) 22, height '5\' 7"'$  (1.702 m), weight 68.3 kg (150 lb 9.2 oz), SpO2 100 %.    Neurological Examination Mental Status: Alert, oriented, to name, place Cranial Nerves:  II: Discs flat bilaterally; Visual fields grossly normal, pupils equal, round, reactive to light and accommodation III,IV, VI: ptosis not present, extra-ocular motions intact bilaterally V,VII: smile symmetric, facial light touch sensation normal bilaterally VIII: hearing normal bilaterally IX,X: gag reflex present XI: bilateral shoulder  shrug XII: midline tongue extension Motor: Right : Upper extremity   4+/5    Left:     Upper extremity   4+/5  Lower extremity   4+/5     Lower extremity   4+/5 Tone and bulk:normal tone throughout; no atrophy noted Sensory: Pinprick and light touch intact throughout, bilaterally Deep Tendon Reflexes: 1+ and symmetric throughout Plantars: Right: downgoing   Left: downgoing Cerebellar: normal finger-to-nose, Gait: not tested       Laboratory Studies:   Basic Metabolic Panel:  Recent Labs Lab 07/21/16 0522 07/22/16 0523 07/23/16 0309 07/24/16 0435 07/25/16 0418  NA 128* 131* 131* 137 136  K 3.2* 4.0 3.7 3.9 3.3*  CL 100* 105 102 106 108  CO2 19* 18* 18* 19* 20*  GLUCOSE 95 88 109* 89 141*  BUN '12 7 7 6 7  '$ CREATININE 1.05 0.79 0.70 0.82 0.79  CALCIUM 7.5* 8.2* 8.0* 8.1* 8.3*    Liver Function Tests:  Recent Labs Lab 07/20/16 1032 07/23/16 0309 07/24/16 0435  AST 28 35 28  ALT '20 23 22  '$ ALKPHOS 48 41 38  BILITOT 0.5 0.6 0.5  PROT 6.5 5.6* 5.4*  ALBUMIN 3.0* 2.4* 2.2*   No results for input(s): LIPASE, AMYLASE in the last 168 hours.  Recent Labs Lab 07/23/16 1833  AMMONIA 22    CBC:  Recent Labs Lab 07/21/16 1130 07/22/16 0523 07/23/16 0309 07/24/16 0435 07/24/16 1506 07/25/16 0418  WBC 11.3* 10.8* 12.0* 10.1  --  10.0  HGB 7.2* 7.8* 7.3* 7.0* 8.6* 8.3*  HCT 21.4* 23.9* 22.2* 21.3* 25.3* 24.7*  MCV 84.1 86.7 85.3 87.6  --  86.6  PLT 345 354 368 352  --  354    Cardiac Enzymes:  Recent Labs Lab 07/20/16 1032 07/22/16 1443 07/22/16 2131 07/23/16 0309  TROPONINI <0.03 0.11* 0.07* 0.09*    BNP: Invalid input(s): POCBNP  CBG:  Recent Labs Lab 07/21/16 0028 07/22/16 1427 07/23/16 1843  GLUCAP 105* 108* 9    Microbiology: Results for orders placed or performed during the hospital encounter of 07/20/16  Gastrointestinal Panel by PCR , Stool     Status: None   Collection Time: 07/20/16  6:40 PM  Result Value Ref Range Status    Campylobacter species NOT DETECTED NOT DETECTED Final   Plesimonas shigelloides NOT DETECTED NOT DETECTED Final   Salmonella species NOT DETECTED NOT DETECTED Final   Yersinia enterocolitica NOT DETECTED NOT DETECTED Final   Vibrio species NOT DETECTED NOT DETECTED Final   Vibrio cholerae NOT DETECTED NOT DETECTED Final   Enteroaggregative E coli (EAEC) NOT DETECTED NOT DETECTED Final   Enteropathogenic E coli (EPEC) NOT DETECTED NOT DETECTED Final   Enterotoxigenic E coli (ETEC) NOT DETECTED NOT DETECTED Final   Shiga like toxin producing E coli (STEC) NOT DETECTED NOT DETECTED Final   Shigella/Enteroinvasive E coli (EIEC) NOT DETECTED NOT DETECTED Final   Cryptosporidium NOT DETECTED NOT DETECTED Final   Cyclospora cayetanensis NOT DETECTED NOT DETECTED Final   Entamoeba histolytica NOT DETECTED NOT DETECTED Final   Giardia lamblia NOT DETECTED NOT DETECTED Final   Adenovirus F40/41 NOT DETECTED NOT DETECTED Final   Astrovirus NOT DETECTED NOT DETECTED Final   Norovirus GI/GII NOT  DETECTED NOT DETECTED Final   Rotavirus A NOT DETECTED NOT DETECTED Final   Sapovirus (I, II, IV, and V) NOT DETECTED NOT DETECTED Final  C difficile quick scan w PCR reflex     Status: None   Collection Time: 07/20/16  6:40 PM  Result Value Ref Range Status   C Diff antigen NEGATIVE NEGATIVE Final   C Diff toxin NEGATIVE NEGATIVE Final   C Diff interpretation No C. difficile detected.  Final  Urine culture     Status: None   Collection Time: 07/22/16 12:45 PM  Result Value Ref Range Status   Specimen Description URINE, RANDOM  Final   Special Requests NONE  Final   Culture NO GROWTH Performed at [facility]   Final   Report Status 07/24/2016 FINAL  Final  MRSA PCR Screening     Status: None   Collection Time: 07/22/16  2:22 PM  Result Value Ref Range Status   MRSA by PCR NEGATIVE NEGATIVE Final    Comment:        The GeneXpert MRSA Assay (FDA approved for NASAL specimens only), is one  component of a comprehensive MRSA colonization surveillance program. It is not intended to diagnose MRSA infection nor to guide or monitor treatment for MRSA infections.     Coagulation Studies:  Recent Labs  07/22/16 1524  LABPROT 14.3  INR 1.11    Urinalysis:  Recent Labs Lab 07/22/16 1245 07/23/16 1828  COLORURINE YELLOW* YELLOW*  LABSPEC 1.011 1.014  PHURINE 5.0 5.0  GLUCOSEU NEGATIVE NEGATIVE  HGBUR 1+* 1+*  BILIRUBINUR NEGATIVE NEGATIVE  KETONESUR 1+* 1+*  PROTEINUR 30* 30*  NITRITE NEGATIVE NEGATIVE  LEUKOCYTESUR NEGATIVE NEGATIVE    Lipid Panel:  No results found for: CHOL, TRIG, HDL, CHOLHDL, VLDL, LDLCALC  HgbA1C: No results found for: HGBA1C  Urine Drug Screen:  No results found for: LABOPIA, COCAINSCRNUR, LABBENZ, AMPHETMU, THCU, LABBARB  Alcohol Level: No results for input(s): ETH in the last 168 hours.    Imaging: Ct Head Wo Contrast  Result Date: 07/24/2016 CLINICAL DATA:  Acute change in mental status. Acute encephalopathy. History of lung cancer and COPD. EXAM: CT HEAD WITHOUT CONTRAST TECHNIQUE: Contiguous axial images were obtained from the base of the skull through the vertex without intravenous contrast. COMPARISON:  None. FINDINGS: Brain: The ventricles, cisterns and other CSF spaces are within normal. There is minimal age related atrophic change present. There is mild chronic ischemic microvascular disease. There is no mass, mass effect, shift midline structures or acute hemorrhage. No evidence of acute infarction. Vascular: Minimal calcified plaque over the cavernous segment of the internal carotid arteries. Skull: Within normal. Sinuses/Orbits: Within normal. IMPRESSION: No acute intracranial findings. Mild chronic ischemic microvascular disease and minimal age related atrophic change. Electronically Signed   By: Marin Olp M.D.   On: 07/24/2016 09:18   Dg Chest Port 1 View  Result Date: 07/24/2016 CLINICAL DATA:  Acute respiratory  failure. EXAM: PORTABLE CHEST 1 VIEW COMPARISON:  07/23/2016, 06/28/2016 and chest CT 06/28/2016 FINDINGS: Lungs demonstrate stable volume loss of the left lung as well as stable mixed interstitial airspace density over the left lung worse over the left base. Cavitary lesion again seen over the left base containing more air as air-fluid level within the cavitary lesion nearly resolved. Stable interstitial changes right base. Remainder of the exam is unchanged. IMPRESSION: Stable changes within the lungs. Known cavitary lesion in the left base demonstrates more air with resolving air-fluid level. Electronically Signed  By: Marin Olp M.D.   On: 07/24/2016 07:43     Assessment/Plan:  70 y.o. male with past medical history of COPD, chronic respiratory failure, history of MAI infection, lung cancer, previous history of hyponatremia presents to the hospital due to weakness, difficulty walking poor by mouth intake and worsening lower extremity edema admitted for acute respiratory failure, needing non invasive ventilation.  Neurology consulted for delirium.  - He is following commands and states current location along with month - according to wife mental status much improved - On 0.5 of precedex which is being titrated off - Likely ICU delirium with improvement - If needed PO seroquel for agitation after checking EKG to make sure no QTC prolongation.  Would start at 25 BID if needed.      07/25/2016, 3:13 PM

## 2016-07-25 NOTE — Progress Notes (Signed)
Much calmer No respiratory distress Remains on dexmedetomidine  Vitals:   07/25/16 0900 07/25/16 1000 07/25/16 1100 07/25/16 1200  BP: (!) 97/45 (!) 106/53 (!) 112/52   Pulse: 74 76 74 81  Resp: (!) 25 (!) 26 (!) 26   Temp:      TempSrc:      SpO2: 96% 98% 99% 99%  Weight:      Height:       Still poorly oriented, RASS 0, + F/C NAD HEENT WNL No JVD Wheezes improved, few rhonchi anteriorly Reg, no M NABS, soft Unna wraps, symmetric BLE edema  BMP Latest Ref Rng & Units 07/25/2016 07/24/2016 07/23/2016  Glucose 65 - 99 mg/dL 141(H) 89 109(H)  BUN 6 - 20 mg/dL '7 6 7  '$ Creatinine 0.61 - 1.24 mg/dL 0.79 0.82 0.70  Sodium 135 - 145 mmol/L 136 137 131(L)  Potassium 3.5 - 5.1 mmol/L 3.3(L) 3.9 3.7  Chloride 101 - 111 mmol/L 108 106 102  CO2 22 - 32 mmol/L 20(L) 19(L) 18(L)  Calcium 8.9 - 10.3 mg/dL 8.3(L) 8.1(L) 8.0(L)   CBC Latest Ref Rng & Units 07/25/2016 07/24/2016 07/24/2016  WBC 3.8 - 10.6 K/uL 10.0 - 10.1  Hemoglobin 13.0 - 18.0 g/dL 8.3(L) 8.6(L) 7.0(L)  Hematocrit 40.0 - 52.0 % 24.7(L) 25.3(L) 21.3(L)  Platelets 150 - 440 K/uL 354 - 352   No new CXR  IMPRESSION: COPD H/O resection for lung cancer Chronic pulmonary MAC-followed outpatient by ID Acute on chronic respiratory failure with hypoxemia Delirium - markedly improved Acute on chronic anemia - secondary to gastrointestinal bleeding Previous dx of iron deficiency anemia  PLAN/REC: Cont current respiratory/pulmonary meds and therapies Wean dex to off over course of day If able to tolerate off of dex, can transfer out of ICU/SDU in AM 10/29  Merton Border, MD PCCM service Mobile [phone] Pager [phone] 07/25/2016

## 2016-07-25 NOTE — Progress Notes (Signed)
THE ATTENDING MD WAS NOTIFIED CONCERNING PATIENT NOT PUTTING OUT ADEQUATE URINE PER HOUR. MD INFORMED ME TO RESTART D5LR AT 100 ML PER HOUR ONCE POTASSIUM CHLORIDE 40 Meq WHICH IS  BEING INFUSED AT 125 mL/ HOUR HAS COMPLETED.

## 2016-07-25 NOTE — Progress Notes (Signed)
[facility] [place] NAME: [name]    MR#:  [id number]  DATE OF BIRTH:  1945/12/14  SUBJECTIVE:  CHIEF COMPLAINT:   Chief Complaint  Patient presents with  . Hypotension   Confused. On Precedex drip. on BiPAP overnight.  Poor Urine OP  REVIEW OF SYSTEMS:   Cannot get review of systems due to change in mental status  ROS  DRUG ALLERGIES:   Allergies  Allergen Reactions  . Other Other (See Comments)    Hydromet cough syrup-causes GI upset  . Codeine Nausea And Vomiting    VITALS:  Blood pressure (!) 96/47, pulse 75, temperature 98.6 F (37 C), temperature source Oral, resp. rate (!) 24, height '5\' 7"'$  (1.702 m), weight 68.3 kg (150 lb 9.2 oz), SpO2 96 %.  PHYSICAL EXAMINATION:  GENERAL:  70 y.o.-year-old patient lying in the bed , drowzy EYES: Pupils equal, round, reactive to light and accommodation. No scleral icterus. Extraocular muscles intact. Conjunctiva pale. HEENT: Head atraumatic, normocephalic. Oropharynx and nasopharynx clear.  NECK:  Supple, no jugular venous distention. No thyroid enlargement, no tenderness.  LUNGS:  Coarse breath sounds CARDIOVASCULAR: S1, S2 normal. No murmurs, rubs, or gallops.  ABDOMEN: Soft, nontender, nondistended. Bowel sounds present. No organomegaly or mass.  EXTREMITIES: positive for pedal edema,no cyanosis, or clubbing. NEUROLOGIC: Moves all 4 extremities. PSYCHIATRIC: The patient is drowzy.  Physical Exam LABORATORY PANEL:   CBC  Recent Labs Lab 07/25/16 0418  WBC 10.0  HGB 8.3*  HCT 24.7*  PLT 354   ------------------------------------------------------------------------------------------------------------------  Chemistries   Recent Labs Lab 07/24/16 0435 07/25/16 0418  NA 137 136  K 3.9 3.3*  CL 106 108  CO2 19* 20*  GLUCOSE 89 141*  BUN 6 7  CREATININE 0.82 0.79  CALCIUM 8.1* 8.3*  AST 28  --   ALT 22  --   ALKPHOS 38  --   BILITOT 0.5  --     ------------------------------------------------------------------------------------------------------------------  Cardiac Enzymes  Recent Labs Lab 07/22/16 2131 07/23/16 0309  TROPONINI 0.07* 0.09*   ------------------------------------------------------------------------------------------------------------------  RADIOLOGY:  Ct Head Wo Contrast  Result Date: 07/24/2016 CLINICAL DATA:  Acute change in mental status. Acute encephalopathy. History of lung cancer and COPD. EXAM: CT HEAD WITHOUT CONTRAST TECHNIQUE: Contiguous axial images were obtained from the base of the skull through the vertex without intravenous contrast. COMPARISON:  None. FINDINGS: Brain: The ventricles, cisterns and other CSF spaces are within normal. There is minimal age related atrophic change present. There is mild chronic ischemic microvascular disease. There is no mass, mass effect, shift midline structures or acute hemorrhage. No evidence of acute infarction. Vascular: Minimal calcified plaque over the cavernous segment of the internal carotid arteries. Skull: Within normal. Sinuses/Orbits: Within normal. IMPRESSION: No acute intracranial findings. Mild chronic ischemic microvascular disease and minimal age related atrophic change. Electronically Signed   By: Marin Olp M.D.   On: 07/24/2016 09:18   Dg Chest Port 1 View  Result Date: 07/24/2016 CLINICAL DATA:  Acute respiratory failure. EXAM: PORTABLE CHEST 1 VIEW COMPARISON:  07/23/2016, 06/28/2016 and chest CT 06/28/2016 FINDINGS: Lungs demonstrate stable volume loss of the left lung as well as stable mixed interstitial airspace density over the left lung worse over the left base. Cavitary lesion again seen over the left base containing more air as air-fluid level within the cavitary lesion nearly resolved. Stable interstitial changes right base. Remainder of the exam is unchanged. IMPRESSION: Stable changes within the lungs. Known cavitary  lesion in the  left base demonstrates more air with resolving air-fluid level. Electronically Signed   By: Marin Olp M.D.   On: 07/24/2016 07:43    ASSESSMENT AND PLAN:   Principal Problem:   Acute respiratory failure (HCC) Active Problems:   Adenocarcinoma of left lung (HCC)   COPD mixed type (HCC)   PNA (pneumonia)   Mycobacterium avium complex (HCC)   Hyponatremia   Anemia   General weakness   Respiratory failure (HCC)   Protein-calorie malnutrition, severe  70 year old male with past medical history of COPD, chronic respiratory failure, history of MAI infection, lung cancer, previous history of hyponatremia presents to the hospital due to weakness, difficulty walking poor by mouth intake and worsening lower extremity edema.  * Acute encephalopathy Etiology unclear CT head normal. Ammonia, B12 normal. Check TSH. Will consult Neurology.  * Acute on chronic anemia Likely chronic blood loss from hemmorhoids Transfused 1 unit PRBC and HB improved. Consulted GI  Colonoscopy in December 2016 showed Impression:   No evidence of recurrent polyps.  Mild sigmoid colon diverticulosis.  Small external hemorrhoids  * Acute on chronic respiratory failure due to COPD exacerbation  bilateral pneumonitis IV abx Nebs  * Hyponatremia - Resolved  * Acute kidney injury secondary to dehydration.  Resolved  * MAI infection- rifampin, ethambutol, Zithromax held while acutely ill - infectious disease on board  * Diarrhea-likely related to the patient's antibiotics. - C diff and Gi panel are negative.   * HTN -   anti-HTN held. Metoprolol restarted  CODE STATUS: Full  TOTAL CC TIME TAKING CARE OF THIS PATIENT: 74 Minutes   [name] R M.D on 07/25/2016   Between 7am to 6pm - Pager - [phone]  After 6pm go to [email or web address] - password [email or web address] [facility]  Office  [phone]  CC: Primary care physician; Asencion Noble, MD  Note: This dictation was  prepared with Dragon dictation along with smaller phrase technology. Any transcriptional errors that result from this process are unintentional.

## 2016-07-25 NOTE — Progress Notes (Signed)
Speech Language Pathology Dysphagia Treatment Patient Details Name: [name] MRN: [id number] DOB: [birth year] Today's Date: 07/25/2016 Time: 3220-2542 SLP Time Calculation (min) (ACUTE ONLY): 28 min  Assessment / Plan / Recommendation Clinical Impression   Pt presents with a moderate oral pharyngeal dysphagia characterized by decreased safety with po intake. Pt refused trials of solids this visit. Pt with immediate and delayed cough with thin liquids via straw. Pt was noted to have anterior spillage with thin liquids via straw. Pt with no overt ssx aspiration with thin via cup. SLP recommends Dysphagia 1 with thin with no straws, continue to crush meds in puree as able. RN notified and agreed.     Diet Recommendation    Dysphagia 1 with thin, no straw, meds crushed in puree.   SLP Plan Continue with current plan of care   Pertinent Vitals/Pain Pt did not report pain   Swallowing Goals     General Behavior/Cognition: Alert;Cooperative;Pleasant mood;Confused Patient Positioning: Upright in bed Oral care provided: N/A  Oral Cavity - Oral Hygiene Does patient have any of the following "at risk" factors?: None of the above Patient is LOW RISK: Follow universal precautions (see row information)   Dysphagia Treatment Treatment Methods: Skilled observation;Compensation strategy training;Patient/caregiver education Patient observed directly with PO's: Yes Type of PO's observed: Dysphagia 1 (puree);Thin liquids Feeding: Able to feed self;Needs assist Liquids provided via: No straw;Cup;Teaspoon Oral Phase Signs & Symptoms: Anterior loss/spillage Pharyngeal Phase Signs & Symptoms: Immediate cough Type of cueing: Verbal;Tactile Amount of cueing: Moderate   GO     [place] Sauber 07/25/2016, 10:14 AM

## 2016-07-26 ENCOUNTER — Inpatient Hospital Stay: Payer: Medicare Other

## 2016-07-26 LAB — BASIC METABOLIC PANEL
Anion gap: 6 (ref 5–15)
Anion gap: 7 (ref 5–15)
BUN: <5 mg/dL — ABNORMAL LOW (ref 6–20)
CALCIUM: 8.5 mg/dL — AB (ref 8.9–10.3)
CALCIUM: 8.6 mg/dL — AB (ref 8.9–10.3)
CHLORIDE: 108 mmol/L (ref 101–111)
CHLORIDE: 109 mmol/L (ref 101–111)
CO2: 24 mmol/L (ref 22–32)
CO2: 22 mmol/L (ref 22–32)
CREATININE: 0.63 mg/dL (ref 0.61–1.24)
CREATININE: 0.65 mg/dL (ref 0.61–1.24)
Glucose, Bld: 125 mg/dL — ABNORMAL HIGH (ref 65–99)
Glucose, Bld: 127 mg/dL — ABNORMAL HIGH (ref 65–99)
Potassium: 3.2 mmol/L — ABNORMAL LOW (ref 3.5–5.1)
Potassium: 3.7 mmol/L (ref 3.5–5.1)
SODIUM: 138 mmol/L (ref 135–145)
SODIUM: 138 mmol/L (ref 135–145)

## 2016-07-26 MED ORDER — POTASSIUM CHLORIDE 10 MEQ/100ML IV SOLN: 10.0000 meq | INTRAVENOUS | Status: DC

## 2016-07-26 MED ORDER — POTASSIUM CHLORIDE 2 MEQ/ML IV SOLN
INTRAVENOUS | Status: DC
  Filled 2016-07-26 (×3): qty 500

## 2016-07-26 MED ORDER — METOCLOPRAMIDE HCL 5 MG/ML IJ SOLN
5.0000 mg | Freq: Three times a day (TID) | INTRAMUSCULAR | Status: AC
  Administered 2016-07-26 (×3): 5 mg via INTRAVENOUS
  Filled 2016-07-26 (×3): qty 2

## 2016-07-26 MED ORDER — POTASSIUM CHLORIDE 2 MEQ/ML IV SOLN
Freq: Once | INTRAVENOUS | Status: AC
  Administered 2016-07-26: 16:00:00 via INTRAVENOUS
  Filled 2016-07-26: qty 500

## 2016-07-26 NOTE — Progress Notes (Signed)
[facility]  ROUNDING NOTE   Subjective:  Patient seen at bedside. Next line he now has an NG tube in place. Wife is at the bedside. A BMP pending for this morning.  Objective:  Vital signs in last 24 hours:  Temp:  [97.9 F (36.6 C)-100.2 F (37.9 C)] 98 F (36.7 C) (10/29 1143) Pulse Rate:  [80-106] 94 (10/29 1143) Resp:  [15-31] 30 (10/29 1143) BP: (94-162)/(47-109) 133/68 (10/29 1143) SpO2:  [94 %-100 %] 97 % (10/29 1143)  Weight change:  Filed Weights   07/20/16 1039 07/20/16 1545 07/22/16 1424  Weight: 74.7 kg (164 lb 9.6 oz) 74.4 kg (164 lb) 68.3 kg (150 lb 9.2 oz)    Intake/Output: I/O last 3 completed shifts: In: 3341.6 [P.O.:240; I.V.:3051.6; IV Piggyback:50] Out: 9892 [Urine:935; Emesis/NG output:300; Other:500]   Intake/Output this shift:  Total I/O In: 450 [I.V.:400; IV Piggyback:50] Out: 215 [Urine:215]  Physical Exam: General: No acute distress   Head: Normocephalic, atraumatic. Moist oral mucosal membranes, NG in place  Eyes: Anicteric  Neck: Supple, trachea midline  Lungs:  Mild bilateral wheezing, normal effort   Heart: Regular rate and rhythm  Abdomen:  Soft, nontender, mild distension, BS present  Extremities: 1+ bilateral lower extremity edema   Neurologic: Nonfocal, moving all four extremities  Skin: No lesions       Basic Metabolic Panel:  Recent Labs Lab 07/21/16 0522 07/22/16 0523 07/23/16 0309 07/24/16 0435 07/25/16 0418  NA 128* 131* 131* 137 136  K 3.2* 4.0 3.7 3.9 3.3*  CL 100* 105 102 106 108  CO2 19* 18* 18* 19* 20*  GLUCOSE 95 88 109* 89 141*  BUN '12 7 7 6 7  '$ CREATININE 1.05 0.79 0.70 0.82 0.79  CALCIUM 7.5* 8.2* 8.0* 8.1* 8.3*    Liver Function Tests:  Recent Labs Lab 07/20/16 1032 07/23/16 0309 07/24/16 0435  AST 28 35 28  ALT '20 23 22  '$ ALKPHOS 48 41 38  BILITOT 0.5 0.6 0.5  PROT 6.5 5.6* 5.4*  ALBUMIN 3.0* 2.4* 2.2*   No results for input(s): LIPASE, AMYLASE in the last 168  hours.  Recent Labs Lab 07/23/16 1833  AMMONIA 22    CBC:  Recent Labs Lab 07/21/16 1130 07/22/16 0523 07/23/16 0309 07/24/16 0435 07/24/16 1506 07/25/16 0418 07/25/16 2348  WBC 11.3* 10.8* 12.0* 10.1  --  10.0  --   HGB 7.2* 7.8* 7.3* 7.0* 8.6* 8.3* 8.2*  HCT 21.4* 23.9* 22.2* 21.3* 25.3* 24.7* 24.0*  MCV 84.1 86.7 85.3 87.6  --  86.6  --   PLT 345 354 368 352  --  354  --     Cardiac Enzymes:  Recent Labs Lab 07/20/16 1032 07/22/16 1443 07/22/16 2131 07/23/16 0309  TROPONINI <0.03 0.11* 0.07* 0.09*    BNP: Invalid input(s): POCBNP  CBG:  Recent Labs Lab 07/21/16 0028 07/22/16 1427 07/23/16 1843  GLUCAP 105* 108* 78    Microbiology: Results for orders placed or performed during the hospital encounter of 07/20/16  Gastrointestinal Panel by PCR , Stool     Status: None   Collection Time: 07/20/16  6:40 PM  Result Value Ref Range Status   Campylobacter species NOT DETECTED NOT DETECTED Final   Plesimonas shigelloides NOT DETECTED NOT DETECTED Final   Salmonella species NOT DETECTED NOT DETECTED Final   Yersinia enterocolitica NOT DETECTED NOT DETECTED Final   Vibrio species NOT DETECTED NOT DETECTED Final   Vibrio cholerae NOT DETECTED NOT DETECTED Final   Enteroaggregative  E coli (EAEC) NOT DETECTED NOT DETECTED Final   Enteropathogenic E coli (EPEC) NOT DETECTED NOT DETECTED Final   Enterotoxigenic E coli (ETEC) NOT DETECTED NOT DETECTED Final   Shiga like toxin producing E coli (STEC) NOT DETECTED NOT DETECTED Final   Shigella/Enteroinvasive E coli (EIEC) NOT DETECTED NOT DETECTED Final   Cryptosporidium NOT DETECTED NOT DETECTED Final   Cyclospora cayetanensis NOT DETECTED NOT DETECTED Final   Entamoeba histolytica NOT DETECTED NOT DETECTED Final   Giardia lamblia NOT DETECTED NOT DETECTED Final   Adenovirus F40/41 NOT DETECTED NOT DETECTED Final   Astrovirus NOT DETECTED NOT DETECTED Final   Norovirus GI/GII NOT DETECTED NOT DETECTED Final    Rotavirus A NOT DETECTED NOT DETECTED Final   Sapovirus (I, II, IV, and V) NOT DETECTED NOT DETECTED Final  C difficile quick scan w PCR reflex     Status: None   Collection Time: 07/20/16  6:40 PM  Result Value Ref Range Status   C Diff antigen NEGATIVE NEGATIVE Final   C Diff toxin NEGATIVE NEGATIVE Final   C Diff interpretation No C. difficile detected.  Final  Urine culture     Status: None   Collection Time: 07/22/16 12:45 PM  Result Value Ref Range Status   Specimen Description URINE, RANDOM  Final   Special Requests NONE  Final   Culture NO GROWTH Performed at [facility]   Final   Report Status 07/24/2016 FINAL  Final  MRSA PCR Screening     Status: None   Collection Time: 07/22/16  2:22 PM  Result Value Ref Range Status   MRSA by PCR NEGATIVE NEGATIVE Final    Comment:        The GeneXpert MRSA Assay (FDA approved for NASAL specimens only), is one component of a comprehensive MRSA colonization surveillance program. It is not intended to diagnose MRSA infection nor to guide or monitor treatment for MRSA infections.     Coagulation Studies: No results for input(s): LABPROT, INR in the last 72 hours.  Urinalysis:  Recent Labs  07/23/16 1828  COLORURINE YELLOW*  LABSPEC 1.014  PHURINE 5.0  GLUCOSEU NEGATIVE  HGBUR 1+*  BILIRUBINUR NEGATIVE  KETONESUR 1+*  PROTEINUR 30*  NITRITE NEGATIVE  LEUKOCYTESUR NEGATIVE      Imaging: Dg Abd 1 View  Result Date: 07/26/2016 CLINICAL DATA:  Enteric tube placement EXAM: ABDOMEN - 1 VIEW COMPARISON:  Abdominal radiograph from earlier today. FINDINGS: Enteric tube terminates in the proximal body of the stomach with the side port in the gastric cardia. Mild gaseous distention of the stomach. No dilated small bowel loops. No evidence of pneumatosis or pneumoperitoneum. Bibasilar lung opacities. Ununited right eleventh rib fracture. IMPRESSION: Enteric tube terminates in the proximal stomach. Mild gaseous  distention of the stomach. No evidence of small-bowel obstruction. Electronically Signed   By: Ilona Sorrel M.D.   On: 07/26/2016 07:17   Dg Abd 1 View  Result Date: 07/26/2016 CLINICAL DATA:  70 year old male with nausea.  O G-tube placement. EXAM: ABDOMEN - 1 VIEW COMPARISON:  Earlier radiograph dated 07/25/2016 FINDINGS: An enteric tube is partially visualized with tip in the upper abdomen over the T12 vertebra, likely at the gastroesophageal junction or in the proximal stomach. There has been interval decompression of the previously seen gaseous distended stomach. Air is noted within the colon. No small bowel dilatation or evidence of obstruction. Left lower lobe pulmonary infiltrative disease known left lung base cavitary lesion. Clinical correlation is recommended. IMPRESSION: Enteric  tube with tip at the gastroesophageal junction or proximal stomach. Interval decompression of the stomach. No evidence of bowel obstruction. Left lower lobe infiltrate and cavitary lesion. Electronically Signed   By: Anner Crete M.D.   On: 07/26/2016 06:48   Dg Abd 1 View  Result Date: 07/25/2016 CLINICAL DATA:  Dysphasia.  Vomiting for 2 days.  Fever. EXAM: ABDOMEN - 1 VIEW COMPARISON:  Chest x-ray from July 24, 2016 FINDINGS: The stomach is distended with gas. The remainder of the bowel gas pattern is normal with no bowel obstruction identified. No free air, portal venous gas, or pneumatosis. The left lower lobe infiltrate again identified. No other acute abnormality. IMPRESSION: 1. Mild distention of the stomach with gas.  This is indeterminate. 2. No bowel obstruction. 3. Left lower lobe pulmonary infiltrate. Electronically Signed   By: Dorise Bullion III M.D   On: 07/25/2016 23:09     Medications:   . dextrose 5% lactated ringers 100 mL/hr at 07/26/16 1135   . albuterol  2.5 mg Nebulization Q6H  . aspirin EC  81 mg Oral Daily  . atorvastatin  10 mg Oral q1800  . budesonide (PULMICORT) nebulizer  solution  0.25 mg Nebulization Q6H  . calcium carbonate  1 tablet Oral TID  . cefTRIAXone  1 g Intravenous Q24H  . feeding supplement (ENSURE ENLIVE)  237 mL Oral BID BM  . metoCLOPramide (REGLAN) injection  5 mg Intravenous Q8H  . pantoprazole (PROTONIX) IV  40 mg Intravenous Q12H   acetaminophen **OR** [DISCONTINUED] acetaminophen, albuterol, [DISCONTINUED] ondansetron **OR** ondansetron (ZOFRAN) IV  Assessment/ Plan:  [name] is a 70 y.o. white male with history of lung cancer, DVT, pulmonary hypertension, mycobacterium infection, chronic hyponatremia, anemia, pneumothorax, and COPD, who was admitted to [facility] on 07/20/2016   1. Hyponatremia: Overall improved.  - Awaiting new serum sodium today. Yesterday's serum sodium was 136.  2. Hypokalemia: also due to poor PO intake and NS -  yesterday serum potassium was 3.3. He was administered potassium chloride 40 mEq IV 1. We are awaiting serum potassium today.  3. Edema: with bilateral venous insufficiency and hypoalbuminemia - Overall lower extremity edema appears to be stable. Avoid diuretics if possible given hypokalemia.  4. Metabolic Acidosis: Awaiting new serum sodium bicarbonate level today.  5. Anemia: Hemoglobin currently 8.2. Continue to monitor.   LOS: 6 [name] 10/29/201711:57 AM

## 2016-07-26 NOTE — Progress Notes (Signed)
Pt with dry, barking cough.  Pt able to cough but not clear throat.  PRN breathing tx given. Dorna Bloom RN

## 2016-07-26 NOTE — Progress Notes (Signed)
Potassium is 3.7.  Discontinued order for IV potassium if K+ < 3.5.  Restarted IV D5 LR at 100 cc/hr.  Dorna Bloom RN

## 2016-07-26 NOTE — Plan of Care (Signed)
Received to Unit in stable condition after receiving report from [facility], RN. Disoriented to time & situation. NSR 94. O2 sat 94% on RA.  Left Nare NGT to LIS with green drainage noted. Foley catheter with clear yellow uop. Denies pain. Denies needs.

## 2016-07-26 NOTE — Progress Notes (Signed)
Much calmer, better oriented No respiratory distress Remains off dexmedetomidine N/V last PM. NGT now in place  Vitals:   07/26/16 1100 07/26/16 1143 07/26/16 1440 07/26/16 1500  BP:  133/68 (!) 126/57 121/68  Pulse: 93 94 100 99  Resp: (!) 25 (!) 30 (!) 27 (!) 24  Temp:  98 F (36.7 C)    TempSrc:  Oral    SpO2: 97% 97% 97% 95%  Weight:      Height:       Oiented to person and place, RASS 0, + F/C NAD HEENT WNL No JVD Wheezes improved, few rhonchi anteriorly Reg, no M NABS, soft Unna wraps, symmetric BLE edema  BMP Latest Ref Rng & Units 07/26/2016 07/25/2016 07/24/2016  Glucose 65 - 99 mg/dL 125(H) 141(H) 89  BUN 6 - 20 mg/dL <5(L) 7 6  Creatinine 0.61 - 1.24 mg/dL 0.63 0.79 0.82  Sodium 135 - 145 mmol/L 138 136 137  Potassium 3.5 - 5.1 mmol/L 3.2(L) 3.3(L) 3.9  Chloride 101 - 111 mmol/L 108 108 106  CO2 22 - 32 mmol/L 24 20(L) 19(L)  Calcium 8.9 - 10.3 mg/dL 8.5(L) 8.3(L) 8.1(L)   CBC Latest Ref Rng & Units 07/25/2016 07/25/2016 07/24/2016  WBC 3.8 - 10.6 K/uL - 10.0 -  Hemoglobin 13.0 - 18.0 g/dL 8.2(L) 8.3(L) 8.6(L)  Hematocrit 40.0 - 52.0 % 24.0(L) 24.7(L) 25.3(L)  Platelets 150 - 440 K/uL - 354 -   No new CXR  IMPRESSION: COPD H/O resection for lung cancer Chronic pulmonary MAC-followed outpatient by ID Acute on chronic respiratory failure with hypoxemia Delirium - markedly improved Acute on chronic anemia - secondary to gastrointestinal bleeding Previous dx of iron deficiency anemia  PLAN/REC: 1) Cont current respiratory/pulmonary meds and therapies 2) Gastric ileus - mgmt per primary team. Caution with metoclopramide as it might exacerbate delirium 3) Discussed with Dr Darvin Neighbours - appears ready for transfer out of ICU/SDU. PCCM will cont to follow as pulmonary consultants. I have requested follow up in pulmonary clinic in 3-4 weeks with me. I will assume all pulmonary care as outpt as discussed previously with his wife  Merton Border, MD PCCM  service Mobile [phone] Pager [phone] 07/26/2016

## 2016-07-26 NOTE — Progress Notes (Signed)
[place] [facility] [place] NAME: [name]    MR#:  [id number]  DATE OF BIRTH:  January 23, 1946  SUBJECTIVE:  CHIEF COMPLAINT:   Chief Complaint  Patient presents with  . Hypotension   Less confused. Off Precedex drip. NG tube placed due to gastric distention.  REVIEW OF SYSTEMS:   Cannot get review of systems due to change in mental status  ROS  DRUG ALLERGIES:   Allergies  Allergen Reactions  . Other Other (See Comments)    Hydromet cough syrup-causes GI upset  . Codeine Nausea And Vomiting    VITALS:  Blood pressure 119/61, pulse 94, temperature 99 F (37.2 C), temperature source Axillary, resp. rate (!) 21, height '5\' 7"'$  (1.702 m), weight 68.3 kg (150 lb 9.2 oz), SpO2 97 %.  PHYSICAL EXAMINATION:  GENERAL:  70 y.o.-year-old patient lying in the bed , drowzy EYES: Pupils equal, round, reactive to light and accommodation. No scleral icterus. Extraocular muscles intact. Conjunctiva pale. HEENT: Head atraumatic, normocephalic. Oropharynx and nasopharynx clear.  NECK:  Supple, no jugular venous distention. No thyroid enlargement, no tenderness.  LUNGS:  Coarse breath sounds CARDIOVASCULAR: S1, S2 normal. No murmurs, rubs, or gallops.  ABDOMEN: Soft, nontender, nondistended. Bowel sounds present. No organomegaly or mass.  EXTREMITIES: positive for pedal edema,no cyanosis, or clubbing. NEUROLOGIC: Moves all 4 extremities. PSYCHIATRIC: The patient is drowzy.  Physical Exam LABORATORY PANEL:   CBC  Recent Labs Lab 07/25/16 0418 07/25/16 2348  WBC 10.0  --   HGB 8.3* 8.2*  HCT 24.7* 24.0*  PLT 354  --    ------------------------------------------------------------------------------------------------------------------  Chemistries   Recent Labs Lab 07/24/16 0435 07/25/16 0418  NA 137 136  K 3.9 3.3*  CL 106 108  CO2 19* 20*  GLUCOSE 89 141*  BUN 6 7  CREATININE 0.82 0.79  CALCIUM 8.1* 8.3*  AST 28  --   ALT 22  --    ALKPHOS 38  --   BILITOT 0.5  --    ------------------------------------------------------------------------------------------------------------------  Cardiac Enzymes  Recent Labs Lab 07/22/16 2131 07/23/16 0309  TROPONINI 0.07* 0.09*   ------------------------------------------------------------------------------------------------------------------  RADIOLOGY:  Dg Abd 1 View  Result Date: 07/26/2016 CLINICAL DATA:  Enteric tube placement EXAM: ABDOMEN - 1 VIEW COMPARISON:  Abdominal radiograph from earlier today. FINDINGS: Enteric tube terminates in the proximal body of the stomach with the side port in the gastric cardia. Mild gaseous distention of the stomach. No dilated small bowel loops. No evidence of pneumatosis or pneumoperitoneum. Bibasilar lung opacities. Ununited right eleventh rib fracture. IMPRESSION: Enteric tube terminates in the proximal stomach. Mild gaseous distention of the stomach. No evidence of small-bowel obstruction. Electronically Signed   By: Ilona Sorrel M.D.   On: 07/26/2016 07:17   Dg Abd 1 View  Result Date: 07/26/2016 CLINICAL DATA:  70 year old male with nausea.  O G-tube placement. EXAM: ABDOMEN - 1 VIEW COMPARISON:  Earlier radiograph dated 07/25/2016 FINDINGS: An enteric tube is partially visualized with tip in the upper abdomen over the T12 vertebra, likely at the gastroesophageal junction or in the proximal stomach. There has been interval decompression of the previously seen gaseous distended stomach. Air is noted within the colon. No small bowel dilatation or evidence of obstruction. Left lower lobe pulmonary infiltrative disease known left lung base cavitary lesion. Clinical correlation is recommended. IMPRESSION: Enteric tube with tip at the gastroesophageal junction or proximal stomach. Interval decompression of the stomach. No evidence of bowel obstruction. Left lower lobe  infiltrate and cavitary lesion. Electronically Signed   By: Anner Crete M.D.   On: 07/26/2016 06:48   Dg Abd 1 View  Result Date: 07/25/2016 CLINICAL DATA:  Dysphasia.  Vomiting for 2 days.  Fever. EXAM: ABDOMEN - 1 VIEW COMPARISON:  Chest x-ray from July 24, 2016 FINDINGS: The stomach is distended with gas. The remainder of the bowel gas pattern is normal with no bowel obstruction identified. No free air, portal venous gas, or pneumatosis. The left lower lobe infiltrate again identified. No other acute abnormality. IMPRESSION: 1. Mild distention of the stomach with gas.  This is indeterminate. 2. No bowel obstruction. 3. Left lower lobe pulmonary infiltrate. Electronically Signed   By: Dorise Bullion III M.D   On: 07/25/2016 23:09    ASSESSMENT AND PLAN:   Principal Problem:   Acute respiratory failure ([place]) Active Problems:   Adenocarcinoma of left lung (HCC)   COPD mixed type (HCC)   PNA (pneumonia)   Mycobacterium avium complex (HCC)   Hyponatremia   Anemia   General weakness   Respiratory failure (HCC)   Protein-calorie malnutrition, severe  70 year old male with past medical history of COPD, chronic respiratory failure, history of MAI infection, lung cancer, previous history of hyponatremia presents to the hospital due to weakness, difficulty walking poor by mouth intake and worsening lower extremity edema.  * Vomiting Had some ileus on chest x-ray. NG tube in place. Will add Reglan.  * Acute encephalopathy Etiology unclear CT head normal. Ammonia, B12 normal. Check TSH. Appreciate neurology input. Most likely ICU delirium. Off Precedex.  * Acute on chronic anemia Likely chronic blood loss from hemmorhoids Transfused 1 unit PRBC and HB improved.  Colonoscopy in December 2016 showed Impression:   No evidence of recurrent polyps.  Mild sigmoid colon diverticulosis.  Small external hemorrhoids  * Acute on chronic respiratory failure due to COPD exacerbation  bilateral pneumonitis IV abx Nebs  * Hyponatremia -  Resolved  * Acute kidney injury secondary to dehydration.  Resolved  * MAI infection- rifampin, ethambutol, Zithromax held while acutely ill - infectious disease on board  * Diarrhea- Resolved - C diff and Gi panel are negative.   * HTN -   anti-HTN held. Metoprolol restarted  CODE STATUS: Full  TOTAL TIME TAKING CARE OF THIS PATIENT: 58 Minutes  We'll transfer to medical floor.   [name] R M.D on 07/26/2016   Between 7am to 6pm - Pager - [phone]  After 6pm go to [email or web address] - password [email or web address] [facility]  Office  [phone]  CC: Primary care physician; Asencion Noble, MD  Note: This dictation was prepared with Dragon dictation along with smaller phrase technology. Any transcriptional errors that result from this process are unintentional.

## 2016-07-26 NOTE — Progress Notes (Signed)
Report given to [facility] RN. Prepare to transfer to room 120. Wife aware of transfer and his 2 sisters are at bedside.

## 2016-07-26 NOTE — Progress Notes (Signed)
Confused to date year and situation.  Cooperative. Follows commands.  Does NOT pull at lines, NGT, foley or O2. Recognizes family and sister. Most speech is appropriate. Lungs with few rhonchi and diminished throughout.  Cough usually non productive. Some tan secretions. Abdomen non distended and nontender.  Reglan started today.  NGT with green bile output.NPO with scant ice chips. One brown smear stool.  Up in chair with 2 assist for 2hrs. Dyspnea on exertion. Unsteady and weak. Shuffles feet. UOP approx 40/hr.  KCL IV ordered for K+ 3.2

## 2016-07-27 ENCOUNTER — Inpatient Hospital Stay: Payer: Medicare Other

## 2016-07-27 ENCOUNTER — Other Ambulatory Visit: Payer: Self-pay | Admitting: *Deleted

## 2016-07-27 DIAGNOSIS — Z515 Encounter for palliative care: Secondary | ICD-10-CM

## 2016-07-27 DIAGNOSIS — Z7189 Other specified counseling: Secondary | ICD-10-CM

## 2016-07-27 DIAGNOSIS — J189 Pneumonia, unspecified organism: Secondary | ICD-10-CM

## 2016-07-27 LAB — BASIC METABOLIC PANEL
Anion gap: 8 (ref 5–15)
CO2: 24 mmol/L (ref 22–32)
Calcium: 8.9 mg/dL (ref 8.9–10.3)
Chloride: 109 mmol/L (ref 101–111)
Creatinine, Ser: 0.7 mg/dL (ref 0.61–1.24)
GFR calc Af Amer: >60 mL/min (ref >60)
GLUCOSE: 123 mg/dL — AB (ref 65–99)
Potassium: 3.7 mmol/L (ref 3.5–5.1)
Sodium: 141 mmol/L (ref 135–145)

## 2016-07-27 LAB — CBC WITH DIFFERENTIAL/PLATELET
BASOS ABS: 0.1 10*3/uL (ref 0–0.1)
Basophils Relative: 1 %
EOS PCT: 13 %
Eosinophils Absolute: 1.7 10*3/uL — ABNORMAL HIGH (ref 0–0.7)
HEMATOCRIT: 27.1 % — AB (ref 40.0–52.0)
Hemoglobin: 8.8 g/dL — ABNORMAL LOW (ref 13.0–18.0)
LYMPHS ABS: 0.8 10*3/uL — AB (ref 1.0–3.6)
LYMPHS PCT: 7 %
MCH: 28.7 pg (ref 26.0–34.0)
MCHC: 32.4 g/dL (ref 32.0–36.0)
MCV: 88.8 fL (ref 80.0–100.0)
MONO ABS: 1 10*3/uL (ref 0.2–1.0)
MONOS PCT: 8 %
NEUTROS ABS: 9 10*3/uL — AB (ref 1.4–6.5)
Neutrophils Relative %: 71 %
Platelets: 399 10*3/uL (ref 150–440)
RBC: 3.05 MIL/uL — ABNORMAL LOW (ref 4.40–5.90)
RDW: 16.5 % — AB (ref 11.5–14.5)
WBC: 12.6 10*3/uL — ABNORMAL HIGH (ref 3.8–10.6)

## 2016-07-27 MED ORDER — FUROSEMIDE 10 MG/ML IJ SOLN
40.0000 mg | Freq: Once | INTRAMUSCULAR | Status: AC
Start: 2016-07-27 — End: 2016-07-27
  Administered 2016-07-27: 17:00:00 40 mg via INTRAVENOUS
  Filled 2016-07-27: qty 4

## 2016-07-27 MED ORDER — METHYLPREDNISOLONE SODIUM SUCC 125 MG IJ SOLR
60.0000 mg | INTRAMUSCULAR | Status: DC
  Administered 2016-07-27 – 2016-07-29 (×3): 60 mg via INTRAVENOUS
  Filled 2016-07-27 (×3): qty 2

## 2016-07-27 MED ORDER — FUROSEMIDE 10 MG/ML IJ SOLN
60.0000 mg | Freq: Once | INTRAMUSCULAR | Status: AC
Start: 2016-07-27 — End: 2016-07-27
  Administered 2016-07-27: 09:00:00 60 mg via INTRAVENOUS
  Filled 2016-07-27: qty 6

## 2016-07-27 NOTE — Progress Notes (Signed)
[name] at [place] NAME: [name]    MR#:  [id number]  DATE OF BIRTH:  December 01, 1945  SUBJECTIVE:  CHIEF COMPLAINT:   Chief Complaint  Patient presents with  . Hypotension   More awake. Paged by nurse due to worsening resp status IN resp distress NGT in place  REVIEW OF SYSTEMS:   Cannot get review of systems due to change in mental status  ROS  DRUG ALLERGIES:   Allergies  Allergen Reactions  . Other Other (See Comments)    Hydromet cough syrup-causes GI upset  . Codeine Nausea And Vomiting    VITALS:  Blood pressure (!) 157/71, pulse (!) 102, temperature 98.1 F (36.7 C), temperature source Oral, resp. rate (!) 24, height '5\' 7"'$  (1.702 m), weight 68.3 kg (150 lb 9.2 oz), SpO2 94 %.  PHYSICAL EXAMINATION:  GENERAL:  70 y.o.-year-old patient lying in the bed , awake. resp distress EYES: Pupils equal, round, reactive to light and accommodation. No scleral icterus. Extraocular muscles intact. Conjunctiva pale. HEENT: Head atraumatic, normocephalic. Oropharynx and nasopharynx clear. NGT in place NECK:  Supple, no jugular venous distention. No thyroid enlargement, no tenderness.  LUNGS:  Coarse breath sounds, crackles CARDIOVASCULAR: S1, S2 normal. No murmurs, rubs, or gallops.  ABDOMEN: Soft, nontender, nondistended. Bowel sounds present. No organomegaly or mass.  EXTREMITIES: positive for pedal edema,no cyanosis, or clubbing. NEUROLOGIC: Moves all 4 extremities. PSYCHIATRIC: The patient is awake and alert  Physical Exam LABORATORY PANEL:   CBC  Recent Labs Lab 07/27/16 0411  WBC 12.6*  HGB 8.8*  HCT 27.1*  PLT 399   ------------------------------------------------------------------------------------------------------------------  Chemistries   Recent Labs Lab 07/24/16 0435  07/27/16 0411  NA 137  < > 141  K 3.9  < > 3.7  CL 106  < > 109  CO2 19*  < > 24  GLUCOSE 89  < > 123*  BUN 6  < > <5*  CREATININE  0.82  < > 0.70  CALCIUM 8.1*  < > 8.9  AST 28  --   --   ALT 22  --   --   ALKPHOS 38  --   --   BILITOT 0.5  --   --   < > = values in this interval not displayed. ------------------------------------------------------------------------------------------------------------------  Cardiac Enzymes  Recent Labs Lab 07/22/16 2131 07/23/16 0309  TROPONINI 0.07* 0.09*   ------------------------------------------------------------------------------------------------------------------  RADIOLOGY:  Dg Abd 1 View  Result Date: 07/26/2016 CLINICAL DATA:  Enteric tube placement EXAM: ABDOMEN - 1 VIEW COMPARISON:  Abdominal radiograph from earlier today. FINDINGS: Enteric tube terminates in the proximal body of the stomach with the side port in the gastric cardia. Mild gaseous distention of the stomach. No dilated small bowel loops. No evidence of pneumatosis or pneumoperitoneum. Bibasilar lung opacities. Ununited right eleventh rib fracture. IMPRESSION: Enteric tube terminates in the proximal stomach. Mild gaseous distention of the stomach. No evidence of small-bowel obstruction. Electronically Signed   By: Ilona Sorrel M.D.   On: 07/26/2016 07:17   Dg Abd 1 View  Result Date: 07/26/2016 CLINICAL DATA:  70 year old male with nausea.  O G-tube placement. EXAM: ABDOMEN - 1 VIEW COMPARISON:  Earlier radiograph dated 07/25/2016 FINDINGS: An enteric tube is partially visualized with tip in the upper abdomen over the T12 vertebra, likely at the gastroesophageal junction or in the proximal stomach. There has been interval decompression of the previously seen gaseous distended stomach. Air is noted within the colon. No  small bowel dilatation or evidence of obstruction. Left lower lobe pulmonary infiltrative disease known left lung base cavitary lesion. Clinical correlation is recommended. IMPRESSION: Enteric tube with tip at the gastroesophageal junction or proximal stomach. Interval decompression of the  stomach. No evidence of bowel obstruction. Left lower lobe infiltrate and cavitary lesion. Electronically Signed   By: Anner Crete M.D.   On: 07/26/2016 06:48   Dg Abd 1 View  Result Date: 07/25/2016 CLINICAL DATA:  Dysphasia.  Vomiting for 2 days.  Fever. EXAM: ABDOMEN - 1 VIEW COMPARISON:  Chest x-ray from July 24, 2016 FINDINGS: The stomach is distended with gas. The remainder of the bowel gas pattern is normal with no bowel obstruction identified. No free air, portal venous gas, or pneumatosis. The left lower lobe infiltrate again identified. No other acute abnormality. IMPRESSION: 1. Mild distention of the stomach with gas.  This is indeterminate. 2. No bowel obstruction. 3. Left lower lobe pulmonary infiltrate. Electronically Signed   By: Dorise Bullion III M.D   On: 07/25/2016 23:09   Dg Chest Port 1 View  Result Date: 07/27/2016 CLINICAL DATA:  Nonproductive cough EXAM: PORTABLE CHEST 1 VIEW COMPARISON:  Three days ago FINDINGS: Postoperative left chest with volume loss. There is superimposed consolidation with cavity at the left base measuring nearly 5 cm diameter. Emphysema with reticular markings in the right lung. Previous right upper lobe surgery. Normal heart size and stable aortic contours. Nasogastric tube at least reaches the stomach. IMPRESSION: 1. Increased interstitial opacity in the right lung could be bronchitis/infectious. 2. Background emphysema and known cystic lung disease, including 5 cm cavity at the left base. Electronically Signed   By: Monte Fantasia M.D.   On: 07/27/2016 07:53    ASSESSMENT AND PLAN:   Principal Problem:   Acute respiratory failure (HCC) Active Problems:   Adenocarcinoma of left lung (HCC)   COPD mixed type (HCC)   PNA (pneumonia)   Mycobacterium avium complex (HCC)   Hyponatremia   Anemia   General weakness   Respiratory failure (HCC)   Protein-calorie malnutrition, severe  70 year old male with past medical history of COPD,  chronic respiratory failure, history of MAI infection, lung cancer, previous history of hyponatremia presents to the hospital due to weakness, difficulty walking poor by mouth intake and worsening lower extremity edema.  * Acute on chronic resp failure - worsening due to pulm edema Acute on chronic diastolic chf STAT IV lasix Stop IVF I/Os. Daily weight.  * Vomiting Had gastric ileus on x-ray. NG tube in place.  Repeat xray later  * Acute encephalopathy CT head normal. Ammonia, B12, TSH normal. Appreciate neurology input. Most likely ICU delirium. Off Precedex.  * Acute on chronic anemia Likely chronic blood loss from hemmorhoids Transfused 1 unit PRBC and HB improved.  Colonoscopy in December 2016 showed. Mild sigmoid colon diverticulosis.Small external hemorrhoids  * Hyponatremia - Resolved  * Acute kidney injury secondary to dehydration.  Resolved  * MAI infection- rifampin, ethambutol, Zithromax held while acutely ill - infectious disease on board  * Diarrhea- Resolved - C diff and Gi panel are negative.   * HTN -   anti-HTN held. Metoprolol restarted  CODE STATUS: Full  TOTAL TIME TAKING CARE OF THIS PATIENT: 35 Minutes of critical care time   Neita Carp M.D on 07/27/2016   Between 7am to 6pm - Pager - [phone]  After 6pm go to [email or web address] - Proofreader  [employer]  [phone]  CC: Primary care physician; Asencion Noble, MD  Note: This dictation was prepared with Dragon dictation along with smaller phrase technology. Any transcriptional errors that result from this process are unintentional.

## 2016-07-27 NOTE — Progress Notes (Signed)
Pt continues to have a dry barking cough.  PRN breathing tx given. Dorna Bloom RN

## 2016-07-27 NOTE — Consult Note (Signed)
   [facility] CM Inpatient Consult   07/27/2016  [name] 1945-10-11 [id number]   Patient screened for potential [facility] services. Patient is eligible for [place]. Spoke with inpatient case manager before talking with patient and she advised patient's discharge disposition is unclear at present. At this time it is uncertain if [facility] services will be appropriate at discharge. If patient's post hospital needs reflect a need for services please place a [facility] Care Management consult. For questions please contact:   Jettson Crable RN, [facility] Liaison  [phone]) Business Mobile [phone]) Toll free office

## 2016-07-27 NOTE — Progress Notes (Addendum)
Text message to [facility] re increased cough and expiratory/inspiratory wheeze.  Awaiting response. Dorna Bloom RN Spoke with Dr Jannifer Franklin.  CXR ordered. Dorna Bloom RN

## 2016-07-27 NOTE — Progress Notes (Signed)
[facility] INFECTIOUS DISEASE PROGRESS NOTE Date of Admission:  07/20/2016     ID: [name] is a 70 y.o. male with hPrincipal Problem:   Acute respiratory failure ([place]) Active Problems:   Adenocarcinoma of left lung (HCC)   COPD mixed type (HCC)   PNA (pneumonia)   Mycobacterium avium complex ([place])   Hyponatremia   Anemia   General weakness   Respiratory failure (HCC)   Protein-calorie malnutrition, severe   Palliative care encounter   DNR (do not resuscitate) discussion  Subjective: Out of unit, no fevers, much less resp distress. NGT in place still. Not eating   ROS  Eleven systems are reviewed and negative except per hpi  Medications:  Antibiotics Given (last 72 hours)    Date/Time Action Medication Dose Rate   07/25/16 0918 Given   cefTRIAXone (ROCEPHIN) IVPB 1 g 1 g 100 mL/hr   07/26/16 1121 Given   cefTRIAXone (ROCEPHIN) IVPB 1 g 1 g 100 mL/hr   07/27/16 0821 Given   cefTRIAXone (ROCEPHIN) IVPB 1 g 1 g 100 mL/hr     . albuterol  2.5 mg Nebulization Q6H  . aspirin EC  81 mg Oral Daily  . atorvastatin  10 mg Oral q1800  . budesonide (PULMICORT) nebulizer solution  0.25 mg Nebulization Q6H  . calcium carbonate  1 tablet Oral TID  . feeding supplement (ENSURE ENLIVE)  237 mL Oral BID BM  . furosemide  40 mg Intravenous Once  . methylPREDNISolone (SOLU-MEDROL) injection  60 mg Intravenous Q24H    Objective: Vital signs in last 24 hours: Temp:  [98.1 F (36.7 C)-98.5 F (36.9 C)] 98.5 F (36.9 C) (10/30 1153) Pulse Rate:  [95-102] 96 (10/30 1153) Resp:  [16-24] 16 (10/30 1153) BP: (119-157)/(47-78) 141/67 (10/30 1153) SpO2:  [92 %-97 %] 93 % (10/30 1359) FiO2 (%):  [21 %] 21 % (10/30 1359) Constitutional: He is oriented to person, place, and time. Frail, in no resp distress HENT: anicteric NGT in place Mouth/Throat: Oropharynx is clear and dry . No oropharyngeal exudate.  Cardiovascular: Normal rate, regular rhythm and normal heart sounds.  E Pulmonary/Chest: tachypneic, poor air movement, on O2 Abdominal: Soft. Bowel sounds are normal. He exhibits no distension. There is no tenderness.  Lymphadenopathy: He has no cervical adenopathy.  Neurological: He is alert and oriented to person, place, and time.  Skin: bil LE wrapped in unnawrap  Lab Results  Recent Labs  07/25/16 0418 07/25/16 2348  07/26/16 2218 07/27/16 0411  WBC 10.0  --   --   --  12.6*  HGB 8.3* 8.2*  --   --  8.8*  HCT 24.7* 24.0*  --   --  27.1*  NA 136  --   < > 138 141  K 3.3*  --   < > 3.7 3.7  CL 108  --   < > 109 109  CO2 20*  --   < > 22 24  BUN 7  --   < > <5* <5*  CREATININE 0.79  --   < > 0.65 0.70  < > = values in this interval not displayed.  Microbiology: Results for orders placed or performed during the hospital encounter of 07/20/16  Gastrointestinal Panel by PCR , Stool     Status: None   Collection Time: 07/20/16  6:40 PM  Result Value Ref Range Status   Campylobacter species NOT DETECTED NOT DETECTED Final   Plesimonas shigelloides NOT DETECTED NOT DETECTED Final   Salmonella  species NOT DETECTED NOT DETECTED Final   Yersinia enterocolitica NOT DETECTED NOT DETECTED Final   Vibrio species NOT DETECTED NOT DETECTED Final   Vibrio cholerae NOT DETECTED NOT DETECTED Final   Enteroaggregative E coli (EAEC) NOT DETECTED NOT DETECTED Final   Enteropathogenic E coli (EPEC) NOT DETECTED NOT DETECTED Final   Enterotoxigenic E coli (ETEC) NOT DETECTED NOT DETECTED Final   Shiga like toxin producing E coli (STEC) NOT DETECTED NOT DETECTED Final   Shigella/Enteroinvasive E coli (EIEC) NOT DETECTED NOT DETECTED Final   Cryptosporidium NOT DETECTED NOT DETECTED Final   Cyclospora cayetanensis NOT DETECTED NOT DETECTED Final   Entamoeba histolytica NOT DETECTED NOT DETECTED Final   Giardia lamblia NOT DETECTED NOT DETECTED Final   Adenovirus F40/41 NOT DETECTED NOT DETECTED Final   Astrovirus NOT DETECTED NOT DETECTED Final   Norovirus  GI/GII NOT DETECTED NOT DETECTED Final   Rotavirus A NOT DETECTED NOT DETECTED Final   Sapovirus (I, II, IV, and V) NOT DETECTED NOT DETECTED Final  C difficile quick scan w PCR reflex     Status: None   Collection Time: 07/20/16  6:40 PM  Result Value Ref Range Status   C Diff antigen NEGATIVE NEGATIVE Final   C Diff toxin NEGATIVE NEGATIVE Final   C Diff interpretation No C. difficile detected.  Final  Urine culture     Status: None   Collection Time: 07/22/16 12:45 PM  Result Value Ref Range Status   Specimen Description URINE, RANDOM  Final   Special Requests NONE  Final   Culture NO GROWTH Performed at [facility]   Final   Report Status 07/24/2016 FINAL  Final  MRSA PCR Screening     Status: None   Collection Time: 07/22/16  2:22 PM  Result Value Ref Range Status   MRSA by PCR NEGATIVE NEGATIVE Final    Comment:        The GeneXpert MRSA Assay (FDA approved for NASAL specimens only), is one component of a comprehensive MRSA colonization surveillance program. It is not intended to diagnose MRSA infection nor to guide or monitor treatment for MRSA infections.      Studies/Results: Dg Abd 1 View  Result Date: 07/26/2016 CLINICAL DATA:  Enteric tube placement EXAM: ABDOMEN - 1 VIEW COMPARISON:  Abdominal radiograph from earlier today. FINDINGS: Enteric tube terminates in the proximal body of the stomach with the side port in the gastric cardia. Mild gaseous distention of the stomach. No dilated small bowel loops. No evidence of pneumatosis or pneumoperitoneum. Bibasilar lung opacities. Ununited right eleventh rib fracture. IMPRESSION: Enteric tube terminates in the proximal stomach. Mild gaseous distention of the stomach. No evidence of small-bowel obstruction. Electronically Signed   By: Ilona Sorrel M.D.   On: 07/26/2016 07:17   Dg Abd 1 View  Result Date: 07/26/2016 CLINICAL DATA:  70 year old male with nausea.  O G-tube placement. EXAM: ABDOMEN - 1 VIEW  COMPARISON:  Earlier radiograph dated 07/25/2016 FINDINGS: An enteric tube is partially visualized with tip in the upper abdomen over the T12 vertebra, likely at the gastroesophageal junction or in the proximal stomach. There has been interval decompression of the previously seen gaseous distended stomach. Air is noted within the colon. No small bowel dilatation or evidence of obstruction. Left lower lobe pulmonary infiltrative disease known left lung base cavitary lesion. Clinical correlation is recommended. IMPRESSION: Enteric tube with tip at the gastroesophageal junction or proximal stomach. Interval decompression of the stomach. No evidence of bowel  obstruction. Left lower lobe infiltrate and cavitary lesion. Electronically Signed   By: Anner Crete M.D.   On: 07/26/2016 06:48   Dg Abd 1 View  Result Date: 07/25/2016 CLINICAL DATA:  Dysphasia.  Vomiting for 2 days.  Fever. EXAM: ABDOMEN - 1 VIEW COMPARISON:  Chest x-ray from July 24, 2016 FINDINGS: The stomach is distended with gas. The remainder of the bowel gas pattern is normal with no bowel obstruction identified. No free air, portal venous gas, or pneumatosis. The left lower lobe infiltrate again identified. No other acute abnormality. IMPRESSION: 1. Mild distention of the stomach with gas.  This is indeterminate. 2. No bowel obstruction. 3. Left lower lobe pulmonary infiltrate. Electronically Signed   By: Dorise Bullion III M.D   On: 07/25/2016 23:09   Dg Chest Port 1 View  Result Date: 07/27/2016 CLINICAL DATA:  Nonproductive cough EXAM: PORTABLE CHEST 1 VIEW COMPARISON:  Three days ago FINDINGS: Postoperative left chest with volume loss. There is superimposed consolidation with cavity at the left base measuring nearly 5 cm diameter. Emphysema with reticular markings in the right lung. Previous right upper lobe surgery. Normal heart size and stable aortic contours. Nasogastric tube at least reaches the stomach. IMPRESSION: 1. Increased  interstitial opacity in the right lung could be bronchitis/infectious. 2. Background emphysema and known cystic lung disease, including 5 cm cavity at the left base. Electronically Signed   By: Monte Fantasia M.D.   On: 07/27/2016 07:53    Assessment/Plan: Steven ROSSBACH is a 69 y.o. male with chronic MAI, on treatment for about a month (rif,, etham and azithro). He also has advance COPD and follows with [facility]. He had been seen 10/10 with increasing edema- I had him stop chlorthalidone and start lasix and he was to fu cards and get echo. Admitted now with worsening edema, increased cr and chronic hyponatremia.   Overall he appears weaker and has some chronic nausea with the triple abx. 10/24 we stopped his triple therapy. Developed worsening copd and was in unit but now back to floor. Unable to produce sputum,  Procalcitonin normal   Recommendations Can stop ceftriaxone since s/[name] 7 days abx and procalcitonin nml Would continue holding the triple therapy until I see him in followup  This may give him time to recover his strength as treatment for MAI can be difficult to tolerate.   Thank you very much for the consult. Will follow with you.  [place], [name]   07/27/2016, 2:59 PM

## 2016-07-27 NOTE — Care Management Important Message (Signed)
Important Message  Patient Details  Name: [name] MRN: [id number] Date of Birth: 1946/05/05   Medicare Important Message Given:  Yes    Shelbie Ammons, RN 07/27/2016, 8:33 AM

## 2016-07-27 NOTE — Consult Note (Signed)
Consultation Note Date: 07/27/2016   Patient Name: [name]  DOB: 1946-01-27  MRN: [id number]  Age / Sex: 70 y.o., male  PCP: Steven Noble, MD Referring Physician: [name], MD  Reason for Consultation: Establishing goals of care  HPI/Patient Profile: 70 y.o. male  with past medical history of acute MI, COPD, MAC infection (diagnosed Sept 2017), h/o small cell carcinoma s/p VATS. CAD, h/o DVT, h/o spontaneous pneumothorax x 3, benign thyroid tumor admitted on 07/20/2016 with progressing shortness of breath, weakness, and bilateral lower extremity swelling. Hospitalization has been complicated by COPD exacerbation requiring BiPAP temporarily, ileus requiring NGT, and now pulmonary edema. Also had acute kidney injury and diarrhea that has resolved.   Clinical Assessment and Goals of Care: I met today with [name] and his wife, [name], at bedside. [name] is a little confused and sleepy and participates intermittently in our conversation. Long discussion with [name] about his overall decline. She says that he began to have health problems back in June 2017 but was still driving and independent until his last hospitalization and has continued to decline "since he began the antibiotics" for MAC infection. She is concerned that he will not be able to tolerate this antibiotics. She is very concerned that he still has NGT and not eating or getting out of bed.   We do discuss the natural progression of COPD complicated by MAC and his overall declining functional status. We did discuss potential options and decisions with continued decline including code status and briefly hospice. She was not surprised to be discussing either one of these topics. She seems open to a more comfort path although she also remains hopeful for some improvement and for him to ultimately return home. She struggles with these decisions mainly  due to the fact that she wishes he could make these decisions for himself.   For now he will remain full code although she would not want prolonged life support for him. She is still hopeful that in the upcoming days he will be able to tell us more about his own wishes. He did comment to [place] "I know that this will kill me, I just don't know when." I think he has some level of understanding but he will make appropriate comments but then will be followed by irrelevant speech.   Primary Decision Maker NEXT OF KIN Wife [name]    SUMMARY OF RECOMMENDATIONS   - Continue full aggressive care for now - Wife wants for patient to make his own decisions regarding aggressiveness of care - Wife remains hopeful for some improvement although seems to have good understanding of barriers - Will continue to follow with hopes patient can participate more in discussions and decisions  Code Status/Advance Care Planning:  Full code   Symptom Management:   Ileus: Continue Reglan and NGT to LIWS. LBM documented as yesterday. Hopeful this is beginning to resolve although he was complaining of nausea.   Nausea: Continue NGT LIWS. Continue Reglan. Continue Zofran prn.   Palliative  Prophylaxis:   Aspiration, Bowel Regimen, Delirium Protocol and Oral Care  Additional Recommendations (Limitations, Scope, Preferences):  Full Scope Treatment  Psycho-social/Spiritual:   Desire for further Chaplaincy support:no  Additional Recommendations: Caregiving  Support/Resources and Education on Hospice  Prognosis:   Unable to determine - difficult to determine but if continues downward trajectory he may be eligible for hospice - did discuss this with his wife.   Discharge Planning: To Be Determined      Primary Diagnoses: Present on Admission: . Hyponatremia . Anemia . COPD mixed type ([place]) . Adenocarcinoma of left lung ([place]) . PNA (pneumonia) . Mycobacterium avium complex ([place]) . Acute respiratory  failure ([place])   I have reviewed the medical record, interviewed the patient and family, and examined the patient. The following aspects are pertinent.  Past Medical History:  Diagnosis Date  . Acute MI   . Arthritis of lumbar spine ([place])   . Asthma   . Clotting disorder ([place])   . Collapsed lung    x3  . COPD (chronic obstructive pulmonary disease) ([place])   . Dyspnea   . History of benign thyroid tumor   . Lung cancer ([place])   . Mycobacterium avium complex ([place]) 06/24/2016  . Pneumonia   . Pneumothorax   . Small cell carcinoma of lung ([employer])   . Spontaneous pneumothorax    Social History   Social History  . Marital status: Married    Spouse name: N/A  . Number of children: 1  . Years of education: N/A   Occupational History  . 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 Smoking status: Former Smoker    Packs/day: 3.00    Years: 30.00    Types: Cigars    Quit date: 03/02/1992  . Smokeless tobacco: Never Used     Comment: quit 22 yrs ago  . Alcohol use No  . Drug use: No  . Sexual activity: Yes   Other Topics Concern  . None   Social History Narrative  . None   Family History  Problem Relation Age of Onset  . Heart disease Mother   . Colon cancer Father   . Heart failure Brother    Scheduled Meds: . albuterol  2.5 mg Nebulization Q6H  . aspirin EC  81 mg Oral Daily  . atorvastatin  10 mg Oral q1800  . budesonide (PULMICORT) nebulizer solution  0.25 mg Nebulization Q6H  . calcium carbonate  1 tablet Oral TID  . cefTRIAXone  1 g Intravenous Q24H  . feeding supplement (ENSURE ENLIVE)  237 mL Oral BID BM  . furosemide  40 mg Intravenous Once  . methylPREDNISolone (SOLU-MEDROL) injection  60 mg Intravenous Q24H   Continuous Infusions:  PRN Meds:.acetaminophen **OR** [DISCONTINUED] acetaminophen, albuterol, [DISCONTINUED] ondansetron **OR** ondansetron (ZOFRAN) IV Allergies  Allergen Reactions  . Other Other (See Comments)     Hydromet cough syrup-causes GI upset  . Codeine Nausea And Vomiting   Review of Systems  Constitutional: Positive for activity change and fatigue.  Gastrointestinal: Positive for nausea.  Neurological: Positive for weakness.    Physical Exam  Constitutional: He appears well-developed. He appears lethargic. He appears ill.  HENT:  NGT to LIWS  Cardiovascular: Normal rate.   Pulmonary/Chest: Effort normal. No accessory muscle usage. No tachypnea.  Mild intermittent distress at rest  Abdominal: Normal appearance.  Neurological: He appears lethargic. He is disoriented.  Periods of understanding with insightful comments but inconsistent  Vital Signs: BP (!) 157/71 (BP Location: Left Arm)   Pulse (!) 102   Temp 98.1 F (36.7 C) (Oral)   Resp (!) 24   Ht 5' 7" (1.702 m)   Wt 68.3 kg (150 lb 9.2 oz)   SpO2 94%   BMI 23.58 kg/m  Pain Assessment: No/denies pain   Pain Score: 0-No pain   SpO2: SpO2: 94 % O2 Device:SpO2: 94 % O2 Flow Rate: .O2 Flow Rate (L/min): 2 L/min  IO: Intake/output summary:  Intake/Output Summary (Last 24 hours) at 07/27/16 1113 Last data filed at 07/27/16 0500  Gross per 24 hour  Intake           818.34 ml  Output             1345 ml  Net          -526.66 ml    LBM: Last BM Date: 07/25/16 Baseline Weight: Weight: 74.7 kg (164 lb 9.6 oz) Most recent weight: Weight: 68.3 kg (150 lb 9.2 oz)     Palliative Assessment/Data:   Flowsheet Rows   Flowsheet Row Most Recent Value  Intake Tab  Referral Department  Hospitalist  Unit at Time of Referral  Oncology Unit  Palliative Care Primary Diagnosis  Sepsis/Infectious Disease  Date Notified  07/27/16  Palliative Care Type  New Palliative care  Reason for referral  Clarify Goals of Care  Date of Admission  07/20/16  # of days IP prior to Palliative referral  7  Clinical Assessment  Psychosocial & Spiritual Assessment  Palliative Care Outcomes      Time In: 0945 Time Out: 1115 Time Total:  35mn Greater than 50%  of this time was spent counseling and coordinating care related to the above assessment and plan.  Signed by: AVinie Sill NP Palliative Medicine Team Pager # [phone](M-F 8a-5p) Team Phone # [phone](Nights/Weekends)

## 2016-07-27 NOTE — Plan of Care (Signed)
Problem: Fluid Volume: Goal: Ability to maintain a balanced intake and output will improve Outcome: Not Applicable Date Met: 69/45/03 NGT to LIS continues, no N/V this shift  Problem: Nutrition: Goal: Adequate nutrition will be maintained Outcome: Not Applicable Date Met: 88/82/80 NPO  Problem: Bowel/Gastric: Goal: Will not experience complications related to bowel motility Outcome: Progressing BM this shift

## 2016-07-28 LAB — BASIC METABOLIC PANEL
ANION GAP: 13 (ref 5–15)
BUN: 13 mg/dL (ref 6–20)
CHLORIDE: 103 mmol/L (ref 101–111)
CO2: 28 mmol/L (ref 22–32)
Calcium: 9 mg/dL (ref 8.9–10.3)
Creatinine, Ser: 1.08 mg/dL (ref 0.61–1.24)
GFR calc Af Amer: >60 mL/min (ref >60)
GLUCOSE: 95 mg/dL (ref 65–99)
POTASSIUM: 2.9 mmol/L — AB (ref 3.5–5.1)
Sodium: 144 mmol/L (ref 135–145)

## 2016-07-28 LAB — MAGNESIUM: MAGNESIUM: 1.4 mg/dL — AB (ref 1.7–2.4)

## 2016-07-28 MED ORDER — FUROSEMIDE 10 MG/ML IJ SOLN
40.0000 mg | Freq: Once | INTRAMUSCULAR | Status: AC
  Administered 2016-07-28: 15:00:00 40 mg via INTRAVENOUS
  Filled 2016-07-28: qty 4

## 2016-07-28 MED ORDER — POTASSIUM CHLORIDE 20 MEQ PO PACK
40.0000 meq | PACK | ORAL | Status: AC
  Administered 2016-07-28 (×2): 40 meq via ORAL
  Filled 2016-07-28 (×2): qty 2

## 2016-07-28 MED ORDER — METOCLOPRAMIDE HCL 5 MG/ML IJ SOLN
5.0000 mg | Freq: Three times a day (TID) | INTRAMUSCULAR | Status: AC
  Administered 2016-07-28 – 2016-07-29 (×3): 5 mg via INTRAVENOUS
  Filled 2016-07-28 (×3): qty 2

## 2016-07-28 MED ORDER — FUROSEMIDE 40 MG PO TABS
40.0000 mg | ORAL_TABLET | Freq: Every day | ORAL | Status: DC
  Administered 2016-07-29: 40 mg via ORAL
  Filled 2016-07-28: qty 1

## 2016-07-28 MED ORDER — POTASSIUM CHLORIDE 10 MEQ/100ML IV SOLN: 10.0000 meq | INTRAVENOUS | Status: DC

## 2016-07-28 MED ORDER — SODIUM CHLORIDE 0.9 % IV SOLN
Freq: Once | INTRAVENOUS | Status: AC
  Administered 2016-07-28: 10:00:00 via INTRAVENOUS
  Filled 2016-07-28: qty 500

## 2016-07-28 NOTE — Progress Notes (Signed)
Pt confused.  Attempted to reorient but pt remains confused. Pt pulling at foley.  Replaced stat lock x 3.  Pt trying to unwrap his legs and getting out of bed.  Placed mitts over his hands in order to prevent him pulling out IV or NGT or foley during shift changed. Dorna Bloom RN

## 2016-07-28 NOTE — Progress Notes (Signed)
Nutrition Follow-up  DOCUMENTATION CODES:   Severe malnutrition in context of acute illness/injury  INTERVENTION:  -Recommend pull NGT and advance diet to NDD1 -If unable to tolerate, we should attempt J-tube feedings  NUTRITION DIAGNOSIS:   Malnutrition related to acute illness as evidenced by energy intake < or equal to 50% for > or equal to 5 days, moderate to severe fluid accumulation, percent weight loss. -ongoing  GOAL:   Patient will meet greater than or equal to 90% of their needs -not meeting   MONITOR:   PO intake, Supplement acceptance, Labs, Weight trends  REASON FOR ASSESSMENT:   Consult Poor PO  ASSESSMENT:   70 yo male admitted with a worsening bilateral LE edema, weakness, poor po intake and difficulty with ambulation; pt with AKI, hyponatremia. Pt with hx of chronic respiratory infection secondary to MAI and is on long-term antibiotics; pt also with small cell carcinoma of lung  Spoke with patient's wife at bedside. She feels like he is doing a little better today. Patient still confused. NGT to suction with very little output. ?Repeat x-ray today and pull tube? Patient has been NPO for 5 days now - needs nutrition support soon if unable to advance diet. No new wts. Labs and medications reviewed: Solumedrol, Ca-Carbonate.  Diet Order:     Skin:  Reviewed, no issues  Last BM:  10/25  Height:   Ht Readings from Last 1 Encounters:  07/22/16 '5\' 7"'$  (1.702 m)    Weight:   Wt Readings from Last 1 Encounters:  07/22/16 150 lb 9.2 oz (68.3 kg)    Ideal Body Weight:     BMI:  Body mass index is 23.58 kg/m.  Estimated Nutritional Needs:   Kcal:  2000-2300 kcals   Protein:  82-102 g  Fluid:  >/= 2 L  EDUCATION NEEDS:   No education needs identified at this time  Satira Anis. Khrystina Bonnes, MS, RD LDN Inpatient Clinical Dietitian Pager [phone]

## 2016-07-28 NOTE — Progress Notes (Signed)
[facility] [place] NAME: [name]    MR#:  [id number]  DATE OF BIRTH:  11-06-1945  SUBJECTIVE:  CHIEF COMPLAINT:   Chief Complaint  Patient presents with  . Hypotension   NGT in place More awake Wife at bedside  REVIEW OF SYSTEMS:   Cannot get review of systems due to change in mental status  ROS  DRUG ALLERGIES:   Allergies  Allergen Reactions  . Other Other (See Comments)    Hydromet cough syrup-causes GI upset  . Codeine Nausea And Vomiting    VITALS:  Blood pressure 138/79, pulse (!) 104, temperature 97.6 F (36.4 C), temperature source Oral, resp. rate 16, height '5\' 7"'$  (1.702 m), weight 68.3 kg (150 lb 9.2 oz), SpO2 92 %.  PHYSICAL EXAMINATION:  GENERAL:  70 y.o.-year-old patient lying in the bed , awake. resp distress EYES: Pupils equal, round, reactive to light and accommodation. No scleral icterus. Extraocular muscles intact. Conjunctiva pale. HEENT: Head atraumatic, normocephalic. Oropharynx and nasopharynx clear. NGT in place NECK:  Supple, no jugular venous distention. No thyroid enlargement, no tenderness.  LUNGS:  Coarse breath sounds, crackles CARDIOVASCULAR: S1, S2 normal. No murmurs, rubs, or gallops.  ABDOMEN: Soft, nontender, nondistended. Bowel sounds present. No organomegaly or mass.  EXTREMITIES: positive for pedal edema,no cyanosis, or clubbing. NEUROLOGIC: Moves all 4 extremities. PSYCHIATRIC: The patient is awake and alert  Physical Exam LABORATORY PANEL:   CBC  Recent Labs Lab 07/27/16 0411  WBC 12.6*  HGB 8.8*  HCT 27.1*  PLT 399   ------------------------------------------------------------------------------------------------------------------  Chemistries   Recent Labs Lab 07/24/16 0435  07/28/16 0555  NA 137  < > 144  K 3.9  < > 2.9*  CL 106  < > 103  CO2 19*  < > 28  GLUCOSE 89  < > 95  BUN 6  < > 13  CREATININE 0.82  < > 1.08  CALCIUM 8.1*  < > 9.0  AST 28  --   --    ALT 22  --   --   ALKPHOS 38  --   --   BILITOT 0.5  --   --   < > = values in this interval not displayed. ------------------------------------------------------------------------------------------------------------------  Cardiac Enzymes  Recent Labs Lab 07/22/16 2131 07/23/16 0309  TROPONINI 0.07* 0.09*   ------------------------------------------------------------------------------------------------------------------  RADIOLOGY:  Dg Chest Port 1 View  Result Date: 07/27/2016 CLINICAL DATA:  Nonproductive cough EXAM: PORTABLE CHEST 1 VIEW COMPARISON:  Three days ago FINDINGS: Postoperative left chest with volume loss. There is superimposed consolidation with cavity at the left base measuring nearly 5 cm diameter. Emphysema with reticular markings in the right lung. Previous right upper lobe surgery. Normal heart size and stable aortic contours. Nasogastric tube at least reaches the stomach. IMPRESSION: 1. Increased interstitial opacity in the right lung could be bronchitis/infectious. 2. Background emphysema and known cystic lung disease, including 5 cm cavity at the left base. Electronically Signed   By: Monte Fantasia M.D.   On: 07/27/2016 07:53   Dg Abd 2 Views  Result Date: 07/27/2016 CLINICAL DATA:  Gastric distension, nasogastric tube in place. History of asthma- COPD, MAC infection EXAM: ABDOMEN - 2 VIEW COMPARISON:  KUB of July 26, 2016 FINDINGS: The NG tube is in reasonable position and the previously demonstrated gaseous distention has been alleviated. A small amount of gas and fluid is present within the stomach. The bowel gas pattern is normal. There are degenerative changes  at multiple lumbar disc levels. There are coarse interstitial lung markings at both bases with an area of confluence on the left which is stable. IMPRESSION: Interval decompression of the previously distended stomach by the nasogastric tube. Electronically Signed   By: David  Martinique M.D.   On:  07/27/2016 16:15    ASSESSMENT AND PLAN:   Principal Problem:   Acute respiratory failure (HCC) Active Problems:   Adenocarcinoma of left lung (HCC)   COPD mixed type (HCC)   PNA (pneumonia)   Mycobacterium avium complex (HCC)   Hyponatremia   Anemia   General weakness   Respiratory failure (HCC)   Protein-calorie malnutrition, severe   Palliative care encounter   DNR (do not resuscitate) discussion  70 year old male with past medical history of COPD, chronic respiratory failure, history of MAI infection, lung cancer, previous history of hyponatremia presents to the hospital due to weakness, difficulty walking poor by mouth intake and worsening lower extremity edema.  * Acute on chronic resp failure - Improving Acute on chronic diastolic chf One more dose IV lasix and start PO from tomorrow I/Os. Daily weight.  * Vomiting Had gastric ileus on x-ray. NG tube in place.  Repeat xray shows resolution D/C NGT  * Acute encephalopathy CT head normal. Ammonia, B12, TSH normal. Appreciate neurology input. Most likely ICU delirium. Off Precedex.  * Acute on chronic anemia Likely chronic blood loss from hemmorhoids Transfused 1 unit PRBC and HB improved.  Colonoscopy in December 2016 showed. Mild sigmoid colon diverticulosis.Small external hemorrhoids  * Hyponatremia - Resolved  * Acute kidney injury secondary to dehydration.  Resolved  * MAI infection- rifampin, ethambutol, Zithromax held while acutely ill - infectious disease on board  * Diarrhea- Resolved - C diff and Gi panel are negative.   * HTN -   anti-HTN held. Metoprolol restarted  CODE STATUS: Full  TOTAL TIME TAKING CARE OF THIS PATIENT: 59 Minutes  [name] R M.D on 07/28/2016   Between 7am to 6pm - Pager - [phone]  After 6pm go to [email or web address] - password [email or web address] [facility]  Office  [phone]  CC: Primary care physician; Steven Noble, MD  Note: This  dictation was prepared with Dragon dictation along with smaller phrase technology. Any transcriptional errors that result from this process are unintentional.

## 2016-07-28 NOTE — Progress Notes (Signed)
[facility] INFECTIOUS DISE[facility] PROGRESS NOTE Date of Admission:  07/20/2016     ID: [name] is a 70 y.o. male with hPrincipal Problem:   Acute respiratory failure ([place]) Active Problems:   Adenocarcinoma of left lung (HCC)   COPD mixed type (HCC)   PNA (pneumonia)   Mycobacterium avium complex ([place])   Hyponatremia   Anemia   General weakness   Respiratory failure (HCC)   Protein-calorie malnutrition, severe   Palliative care encounter   DNR (do not resuscitate) discussion  Subjective:  no fevers, ctx stopped much less resp distress. NGT out   ROS  Eleven systems are reviewed and negative except per hpi  Medications:  Antibiotics Given (last 72 hours)    Date/Time Action Medication Dose Rate   07/26/16 1121 Given   cefTRIAXone (ROCEPHIN) IVPB 1 g 1 g 100 mL/hr   07/27/16 0821 Given   cefTRIAXone (ROCEPHIN) IVPB 1 g 1 g 100 mL/hr     . albuterol  2.5 mg Nebulization Q6H  . aspirin EC  81 mg Oral Daily  . atorvastatin  10 mg Oral q1800  . budesonide (PULMICORT) nebulizer solution  0.25 mg Nebulization Q6H  . calcium carbonate  1 tablet Oral TID  . feeding supplement (ENSURE ENLIVE)  237 mL Oral BID BM  . [START ON 07/29/2016] furosemide  40 mg Oral Daily  . methylPREDNISolone (SOLU-MEDROL) injection  60 mg Intravenous Q24H  . metoCLOPramide (REGLAN) injection  5 mg Intravenous Q8H  . potassium chloride  40 mEq Oral Q4H    Objective: Vital signs in last 24 hours: Temp:  [97.6 F (36.4 C)-98.4 F (36.9 C)] 97.8 F (36.6 C) (10/31 1251) Pulse Rate:  [90-104] 92 (10/31 1251) Resp:  [16] 16 (10/31 1251) BP: (129-139)/(62-79) 129/72 (10/31 1251) SpO2:  [91 %-93 %] 93 % (10/31 1251) Constitutional: He is oriented to person, place, and time. Frail, in no resp distress HENT: anicteric  Mouth/Throat: Oropharynx is clear and dry . No oropharyngeal exudate.  Cardiovascular: Normal rate, regular rhythm and normal heart sounds. E Pulmonary/Chest: tachypneic, poor  air movement, on O2 Abdominal: Soft. Bowel sounds are normal. He exhibits no distension. There is no tenderness.  Lymphadenopathy: He has no cervical adenopathy.  Neurological: He is alert and oriented to person, place, and time.  Skin: bil LE wrapped in unnawrap  Lab Results  Recent Labs  07/25/16 2348  07/27/16 0411 07/28/16 0555  WBC  --   --  12.6*  --   HGB 8.2*  --  8.8*  --   HCT 24.0*  --  27.1*  --   NA  --   < > 141 144  K  --   < > 3.7 2.9*  CL  --   < > 109 103  CO2  --   < > 24 28  BUN  --   < > <5* 13  CREATININE  --   < > 0.70 1.08  < > = values in this interval not displayed.  Microbiology: Results for orders placed or performed during the hospital encounter of 07/20/16  Gastrointestinal Panel by PCR , Stool     Status: None   Collection Time: 07/20/16  6:40 PM  Result Value Ref Range Status   Campylobacter species NOT DETECTED NOT DETECTED Final   Plesimonas shigelloides NOT DETECTED NOT DETECTED Final   Salmonella species NOT DETECTED NOT DETECTED Final   Yersinia enterocolitica NOT DETECTED NOT DETECTED Final   Vibrio species NOT  DETECTED NOT DETECTED Final   Vibrio cholerae NOT DETECTED NOT DETECTED Final   Enteroaggregative E coli (EAEC) NOT DETECTED NOT DETECTED Final   Enteropathogenic E coli (EPEC) NOT DETECTED NOT DETECTED Final   Enterotoxigenic E coli (ETEC) NOT DETECTED NOT DETECTED Final   Shiga like toxin producing E coli (STEC) NOT DETECTED NOT DETECTED Final   Shigella/Enteroinvasive E coli (EIEC) NOT DETECTED NOT DETECTED Final   Cryptosporidium NOT DETECTED NOT DETECTED Final   Cyclospora cayetanensis NOT DETECTED NOT DETECTED Final   Entamoeba histolytica NOT DETECTED NOT DETECTED Final   Giardia lamblia NOT DETECTED NOT DETECTED Final   Adenovirus F40/41 NOT DETECTED NOT DETECTED Final   Astrovirus NOT DETECTED NOT DETECTED Final   Norovirus GI/GII NOT DETECTED NOT DETECTED Final   Rotavirus A NOT DETECTED NOT DETECTED Final    Sapovirus (I, II, IV, and V) NOT DETECTED NOT DETECTED Final  C difficile quick scan w PCR reflex     Status: None   Collection Time: 07/20/16  6:40 PM  Result Value Ref Range Status   C Diff antigen NEGATIVE NEGATIVE Final   C Diff toxin NEGATIVE NEGATIVE Final   C Diff interpretation No C. difficile detected.  Final  Urine culture     Status: None   Collection Time: 07/22/16 12:45 PM  Result Value Ref Range Status   Specimen Description URINE, RANDOM  Final   Special Requests NONE  Final   Culture NO GROWTH Performed at [facility]   Final   Report Status 07/24/2016 FINAL  Final  MRSA PCR Screening     Status: None   Collection Time: 07/22/16  2:22 PM  Result Value Ref Range Status   MRSA by PCR NEGATIVE NEGATIVE Final    Comment:        The GeneXpert MRSA Assay (FDA approved for NASAL specimens only), is one component of a comprehensive MRSA colonization surveillance program. It is not intended to diagnose MRSA infection nor to guide or monitor treatment for MRSA infections.      Studies/Results: Dg Chest Port 1 View  Result Date: 07/27/2016 CLINICAL DATA:  Nonproductive cough EXAM: PORTABLE CHEST 1 VIEW COMPARISON:  Three days ago FINDINGS: Postoperative left chest with volume loss. There is superimposed consolidation with cavity at the left base measuring nearly 5 cm diameter. Emphysema with reticular markings in the right lung. Previous right upper lobe surgery. Normal heart size and stable aortic contours. Nasogastric tube at least reaches the stomach. IMPRESSION: 1. Increased interstitial opacity in the right lung could be bronchitis/infectious. 2. Background emphysema and known cystic lung disease, including 5 cm cavity at the left base. Electronically Signed   By: Monte Fantasia M.D.   On: 07/27/2016 07:53   Dg Abd 2 Views  Result Date: 07/27/2016 CLINICAL DATA:  Gastric distension, nasogastric tube in place. History of asthma- COPD, MAC infection EXAM:  ABDOMEN - 2 VIEW COMPARISON:  KUB of July 26, 2016 FINDINGS: The NG tube is in reasonable position and the previously demonstrated gaseous distention has been alleviated. A small amount of gas and fluid is present within the stomach. The bowel gas pattern is normal. There are degenerative changes at multiple lumbar disc levels. There are coarse interstitial lung markings at both bases with an area of confluence on the left which is stable. IMPRESSION: Interval decompression of the previously distended stomach by the nasogastric tube. Electronically Signed   By: Carlos Heber  Martinique M.D.   On: 07/27/2016 16:15  Assessment/Plan: JARRETT ALBOR is a 70 y.o. male with chronic MAI, on treatment for about a month (rif,, etham and azithro). He also has advance COPD and follows with [facility]. He had been seen 10/10 with increasing edema- I had him stop chlorthalidone and start lasix and he was to fu cards and get echo. Admitted now with worsening edema, increased cr and chronic hyponatremia.   Overall he appears weaker and has some chronic nausea with the triple abx. 10/24 we stopped his triple therapy. Developed worsening copd and was in unit but now back to floor. Unable to produce sputum,  Procalcitonin normal  Ctx stopped 10/30 after 7 days  Recommendations Would continue holding the triple therapy for MAI until I see him in followup  This may give him time to recover his strength as treatment for MAI can be difficult to tolerate.  Continue to treat COPD exac-  I will sign off please call with questions and sched fu with me 10 days after dc  Thank you very much for the consult.   [place], Saavi Mceachron P   07/28/2016, 3:07 PM

## 2016-07-28 NOTE — Progress Notes (Signed)
Daily Progress Note   Patient Name: [name]       Date: 07/28/2016 DOB: 1946/09/12  Age: 70 y.o. MRN#: [id number] Attending Physician: Steven Bow, MD Primary Care Physician: Steven Noble, MD Admit Date: 07/20/2016  Reason for Consultation/Follow-up: Establishing goals of care  Subjective: I met again with [name] and wife, [name], at bedside. [name] appears much more alert and maybe slightly confused but somewhat improved from yesterday. [name] remains hopeful that he will continue to improve. Hopeful that NGT may be removed and he can have diet. Would also encourage up to chair as no respiratory distress today and on room air. Will continue to follow with hopes delirium/confusion clears so that [place] can be addressed specifically with patient - this is [name]'s hopes but she does seem to have good understanding and very supportive. No changes in [place] today.   Length of Stay: 8  Current Medications: Scheduled Meds:  . albuterol  2.5 mg Nebulization Q6H  . aspirin EC  81 mg Oral Daily  . atorvastatin  10 mg Oral q1800  . budesonide (PULMICORT) nebulizer solution  0.25 mg Nebulization Q6H  . calcium carbonate  1 tablet Oral TID  . feeding supplement (ENSURE ENLIVE)  237 mL Oral BID BM  . methylPREDNISolone (SOLU-MEDROL) injection  60 mg Intravenous Q24H    Continuous Infusions:    PRN Meds: acetaminophen **OR** [DISCONTINUED] acetaminophen, albuterol, [DISCONTINUED] ondansetron **OR** ondansetron (ZOFRAN) IV  Physical Exam  Constitutional: He appears well-developed and well-nourished.  HENT:  Head: Normocephalic and atraumatic.  Cardiovascular: Normal rate and regular rhythm.   Pulmonary/Chest: Effort normal. No accessory muscle usage. No tachypnea. No respiratory distress.    Abdominal: Soft.  Neurological: He is alert. He is disoriented.  Nursing note and vitals reviewed.           Vital Signs: BP 138/79 (BP Location: Left Arm)   Pulse (!) 104   Temp 97.6 F (36.4 C) (Oral)   Resp 16   Ht 5' 7" (1.702 m)   Wt 68.3 kg (150 lb 9.2 oz)   SpO2 92%   BMI 23.58 kg/m  SpO2: SpO2: 92 % O2 Device: O2 Device: Not Delivered O2 Flow Rate: O2 Flow Rate (L/min): 2 L/min  Intake/output summary:  Intake/Output Summary (Last 24 hours) at 07/28/16 1147 Last data filed  at 07/28/16 0600  Gross per 24 hour  Intake                0 ml  Output             2350 ml  Net            -2350 ml   LBM: Last BM Date: 07/27/16 Baseline Weight: Weight: 74.7 kg (164 lb 9.6 oz) Most recent weight: Weight: 68.3 kg (150 lb 9.2 oz)       Palliative Assessment/Data: PPS: 30%   Flowsheet Rows   Flowsheet Row Most Recent Value  Intake Tab  Referral Department  Hospitalist  Unit at Time of Referral  Med/Surg Unit  Palliative Care Primary Diagnosis  Pulmonary  Date Notified  07/27/16  Palliative Care Type  New Palliative care  Reason for referral  Clarify Goals of Care  Date of Admission  07/20/16  Date first seen by Palliative Care  07/27/16  # of days Palliative referral response time  0 Day(s)  # of days IP prior to Palliative referral  7  Clinical Assessment  Palliative Performance Scale Score  20%  Psychosocial & Spiritual Assessment  Palliative Care Outcomes  Patient/Family meeting held?  Yes  Who was at the meeting?  wife      Patient Active Problem List   Diagnosis Date Noted  . Palliative care encounter   . DNR (do not resuscitate) discussion   . Protein-calorie malnutrition, severe 07/24/2016  . Respiratory failure ([place])   . Acute respiratory failure ([place]) 07/22/2016  . Anemia 07/21/2016  . General weakness   . Hyponatremia 07/20/2016  . COPD exacerbation ([place]) 06/28/2016  . Mycobacterium avium complex ([place]) 06/24/2016  . Bronchiectasis without  acute exacerbation ([place]) 05/20/2016  . Lung disease, bullous ([place]) 05/19/2016  . Peripheral edema 12/30/2014  . Encounter for therapeutic drug monitoring 03/15/2014  . Mediastinal adenopathy 01/26/2014  . Tick bite of axillary region 12/26/2013  . Pulmonary embolus ([place]) 12/22/2013  . PNA (pneumonia) 12/22/2013  . Hyperlipidemia 12/26/2012  . NSTEMI (non-ST elevated myocardial infarction) ([place]) 06/03/2012  . HTN (hypertension) 06/03/2012  . Adenocarcinoma of left lung ([place]) 04/30/2008  . DYSPNEA 11/18/2007  . COPD mixed type ([place]) 11/06/2007  . SPONTANEOUS PNEUMOTHORAX 10/31/2007    Palliative Care Assessment & Plan   HPI: 70 y.o. male  with past medical history of acute MI, COPD, MAC infection (diagnosed Sept 2017), h/o small cell carcinoma s/p VATS. CAD, h/o DVT, h/o spontaneous pneumothorax x 3, benign thyroid tumor admitted on 07/20/2016 with progressing shortness of breath, weakness, and bilateral lower extremity swelling. Hospitalization has been complicated by COPD exacerbation requiring BiPAP temporarily, ileus requiring NGT, and now pulmonary edema. Also had acute kidney injury and diarrhea that has resolved.   Assessment: Sitting up in bed, more alert, smiling. Says he is hungry today.   Recommendations/Plan:  Ileus: Continue Reglan and NGT to LIWS. LBM documented as yesterday. Abd film looks good yest evening.   Nausea: Continue Reglan. Continue Zofran prn.    Goals of Care and Additional Recommendations:  Limitations on Scope of Treatment: Full Scope Treatment  Code Status:  Full code  Prognosis:  Unable to determine - difficult to determine but if continues downward trajectory he may be eligible for hospice - did discuss this with his wife.   Discharge Planning:  To Be Determined. Likely SNF with palliative follow up.    Thank you for allowing the Palliative Medicine Team to assist in the  care of this patient.   Time In: 1010 Time Out: 1045 Total Time  78mn Prolonged Time Billed  no       Greater than 50%  of this time was spent counseling and coordinating care related to the above assessment and plan.  AVinie Sill NP Palliative Medicine Team Pager # [phone](M-F 8a-5p) Team Phone # [phone](Nights/Weekends)

## 2016-07-29 ENCOUNTER — Encounter
Admission: RE | Admit: 2016-07-29 | Discharge: 2016-07-29 | Disposition: A | Discharge status: Home / Self Care | Payer: Medicare Other | Source: Ambulatory Visit | Attending: Internal Medicine | Admitting: Internal Medicine

## 2016-07-29 DIAGNOSIS — A310 Pulmonary mycobacterial infection: Secondary | ICD-10-CM | POA: Diagnosis not present

## 2016-07-29 DIAGNOSIS — I252 Old myocardial infarction: Secondary | ICD-10-CM | POA: Diagnosis not present

## 2016-07-29 DIAGNOSIS — R41841 Cognitive communication deficit: Secondary | ICD-10-CM | POA: Diagnosis not present

## 2016-07-29 DIAGNOSIS — J449 Chronic obstructive pulmonary disease, unspecified: Secondary | ICD-10-CM | POA: Diagnosis not present

## 2016-07-29 DIAGNOSIS — I10 Essential (primary) hypertension: Secondary | ICD-10-CM | POA: Diagnosis not present

## 2016-07-29 DIAGNOSIS — R6889 Other general symptoms and signs: Secondary | ICD-10-CM | POA: Diagnosis not present

## 2016-07-29 DIAGNOSIS — E46 Unspecified protein-calorie malnutrition: Secondary | ICD-10-CM | POA: Diagnosis not present

## 2016-07-29 DIAGNOSIS — Z7951 Long term (current) use of inhaled steroids: Secondary | ICD-10-CM | POA: Diagnosis not present

## 2016-07-29 DIAGNOSIS — K5730 Diverticulosis of large intestine without perforation or abscess without bleeding: Secondary | ICD-10-CM | POA: Diagnosis not present

## 2016-07-29 DIAGNOSIS — D649 Anemia, unspecified: Secondary | ICD-10-CM | POA: Diagnosis not present

## 2016-07-29 DIAGNOSIS — J9600 Acute respiratory failure, unspecified whether with hypoxia or hypercapnia: Secondary | ICD-10-CM | POA: Diagnosis not present

## 2016-07-29 DIAGNOSIS — I2510 Atherosclerotic heart disease of native coronary artery without angina pectoris: Secondary | ICD-10-CM | POA: Diagnosis not present

## 2016-07-29 DIAGNOSIS — J9610 Chronic respiratory failure, unspecified whether with hypoxia or hypercapnia: Secondary | ICD-10-CM | POA: Diagnosis not present

## 2016-07-29 DIAGNOSIS — M6281 Muscle weakness (generalized): Secondary | ICD-10-CM | POA: Diagnosis not present

## 2016-07-29 DIAGNOSIS — Z7982 Long term (current) use of aspirin: Secondary | ICD-10-CM | POA: Diagnosis not present

## 2016-07-29 DIAGNOSIS — I5032 Chronic diastolic (congestive) heart failure: Secondary | ICD-10-CM | POA: Diagnosis not present

## 2016-07-29 DIAGNOSIS — I509 Heart failure, unspecified: Secondary | ICD-10-CM | POA: Diagnosis not present

## 2016-07-29 DIAGNOSIS — E871 Hypo-osmolality and hyponatremia: Secondary | ICD-10-CM | POA: Diagnosis not present

## 2016-07-29 DIAGNOSIS — R531 Weakness: Secondary | ICD-10-CM | POA: Diagnosis not present

## 2016-07-29 DIAGNOSIS — N179 Acute kidney failure, unspecified: Secondary | ICD-10-CM | POA: Diagnosis not present

## 2016-07-29 DIAGNOSIS — D500 Iron deficiency anemia secondary to blood loss (chronic): Secondary | ICD-10-CM | POA: Diagnosis not present

## 2016-07-29 DIAGNOSIS — Z515 Encounter for palliative care: Secondary | ICD-10-CM | POA: Diagnosis not present

## 2016-07-29 DIAGNOSIS — E43 Unspecified severe protein-calorie malnutrition: Secondary | ICD-10-CM | POA: Diagnosis not present

## 2016-07-29 DIAGNOSIS — I110 Hypertensive heart disease with heart failure: Secondary | ICD-10-CM | POA: Diagnosis not present

## 2016-07-29 DIAGNOSIS — C3412 Malignant neoplasm of upper lobe, left bronchus or lung: Secondary | ICD-10-CM | POA: Diagnosis not present

## 2016-07-29 DIAGNOSIS — R262 Difficulty in walking, not elsewhere classified: Secondary | ICD-10-CM | POA: Diagnosis not present

## 2016-07-29 DIAGNOSIS — G9340 Encephalopathy, unspecified: Secondary | ICD-10-CM | POA: Diagnosis not present

## 2016-07-29 DIAGNOSIS — R1312 Dysphagia, oropharyngeal phase: Secondary | ICD-10-CM | POA: Diagnosis not present

## 2016-07-29 DIAGNOSIS — Z7401 Bed confinement status: Secondary | ICD-10-CM | POA: Diagnosis not present

## 2016-07-29 DIAGNOSIS — E785 Hyperlipidemia, unspecified: Secondary | ICD-10-CM | POA: Diagnosis not present

## 2016-07-29 DIAGNOSIS — Z8701 Personal history of pneumonia (recurrent): Secondary | ICD-10-CM | POA: Diagnosis not present

## 2016-07-29 LAB — BASIC METABOLIC PANEL
ANION GAP: 12 (ref 5–15)
BUN: 21 mg/dL — ABNORMAL HIGH (ref 6–20)
CALCIUM: 8.9 mg/dL (ref 8.9–10.3)
CO2: 29 mmol/L (ref 22–32)
Chloride: 100 mmol/L — ABNORMAL LOW (ref 101–111)
Creatinine, Ser: 1.01 mg/dL (ref 0.61–1.24)
GLUCOSE: 103 mg/dL — AB (ref 65–99)
POTASSIUM: 3.3 mmol/L — AB (ref 3.5–5.1)
Sodium: 141 mmol/L (ref 135–145)

## 2016-07-29 LAB — MAGNESIUM: Magnesium: 1.6 mg/dL — ABNORMAL LOW (ref 1.7–2.4)

## 2016-07-29 MED ORDER — MAGNESIUM OXIDE -MG SUPPLEMENT 400 (240 MG) MG PO TABS: 400.0000 mg | ORAL_TABLET | Freq: Every day | ORAL | Status: DC

## 2016-07-29 MED ORDER — FUROSEMIDE 20 MG PO TABS: 40.0000 mg | ORAL_TABLET | Freq: Every day | ORAL | Status: DC

## 2016-07-29 MED ORDER — TRAMADOL HCL 50 MG PO TABS: 50.0000 mg | ORAL_TABLET | Freq: Four times a day (QID) | ORAL | 0 refills | Status: DC | PRN

## 2016-07-29 MED ORDER — PREDNISONE 20 MG PO TABS: 20.0000 mg | ORAL_TABLET | Freq: Every day | ORAL | 0 refills | Status: DC

## 2016-07-29 MED ORDER — MAGNESIUM OXIDE 400 (241.3 MG) MG PO TABS
800.0000 mg | ORAL_TABLET | Freq: Once | ORAL | Status: AC
  Administered 2016-07-29: 800 mg via ORAL
  Filled 2016-07-29: qty 2

## 2016-07-29 MED ORDER — ENSURE ENLIVE PO LIQD: 237.0000 mL | Freq: Two times a day (BID) | ORAL | 12 refills | Status: DC

## 2016-07-29 NOTE — Progress Notes (Addendum)
LCSW has been made aware of discharge of patient to [facility] has called facility in effort to inform them regarding discharge. Information faxed through [employer]. Will await review and follow up with SNF.  Facility agreeable to accept patient today. Patient going to room 208A Report:  [phone]  Patient will transport by EMS. Wife notified of discharge.   Lane Hacker, MSW Clinical Social Work: [employer] Coverage for :  [phone]

## 2016-07-29 NOTE — Progress Notes (Signed)
Pt d/c to [facility] via stretcher by EMS

## 2016-07-29 NOTE — Progress Notes (Signed)
New referral for patient to be followed by Palliative NP at [facility] following discharge received from Palliative NP [name]. [facility] referral notified. Thank you. Flo Shanks RN, BSN, [facility] and [facility], [facility] [phone] c

## 2016-07-29 NOTE — Progress Notes (Signed)
Report called to Sharyn Lull at [facility] for patient discharge per orders. EMS notified of transport need.

## 2016-07-29 NOTE — Discharge Instructions (Addendum)
Diet Discharge Recommendations:  Dysphagia 3(chopped) w/ Thin liquids; aspiration precautions; Meds given in Puree for easier swallowing. Tray setup at all meals; support as needed.  Activity as tolerated.  Follow up with Dr. Leonidas Romberg( Pulmonary) and Dr. Ola Spurr( ID) in 1 week  Speech to see at rehab

## 2016-07-29 NOTE — Progress Notes (Signed)
Pt ambulated in room, very weak, took 2 assist, sats remained 93-95% on room air.

## 2016-07-29 NOTE — Discharge Summary (Addendum)
[facility] NAME: [name]    MR#:  [id number]  DATE OF BIRTH:  1945-09-30  DATE OF ADMISSION:  07/20/2016 ADMITTING PHYSICIAN: Henreitta Leber, MD  DATE OF DISCHARGE: 07/29/2016  PRIMARY CARE PHYSICIAN: Asencion Noble, MD   ADMISSION DIAGNOSIS:  Hyponatremia [E87.1] General weakness [R53.1]  DISCHARGE DIAGNOSIS:  Principal Problem:   Acute respiratory failure (HCC) Active Problems:   Adenocarcinoma of left lung (HCC)   COPD mixed type (HCC)   PNA (pneumonia)   Mycobacterium avium complex (HCC)   Hyponatremia   Anemia   General weakness   Respiratory failure (HCC)   Protein-calorie malnutrition, severe   Palliative care encounter   DNR (do not resuscitate) discussion   SECONDARY DIAGNOSIS:   Past Medical History:  Diagnosis Date  . Acute MI   . Arthritis of lumbar spine ([place])   . Asthma   . Clotting disorder ([place])   . Collapsed lung    x3  . COPD (chronic obstructive pulmonary disease) ([place])   . Dyspnea   . History of benign thyroid tumor   . Lung cancer ([place])   . Mycobacterium avium complex ([place]) 06/24/2016  . Pneumonia   . Pneumothorax   . Small cell carcinoma of lung ([place])   . Spontaneous pneumothorax      ADMITTING HISTORY  [name]  is a 70 y.o. male with a known history of COPD, MAI infection, history of small cell carcinoma of the lung, chronic respiratory failure, previous history of hyponatremia, history of coronary artery disease, previous history of DVT who presents to the hospital due to worsening lower extremity edema, weakness and difficulty walking and poor by mouth intake. Patient was recently discharged from the hospital about 2-3 weeks ago after treatment for a COPD flare. Patient has a chronic respiratory infection secondary to MAI and is currently on long-term antibiotics and follows with Dr. Ola Spurr and was recently seen by him in [facility] on oral Lasix for his lower extremity edema. As  per the wife despite being on the Lasix patient's weakness and low some edema has not improved and therefore she brought him to the ER for further evaluation. Patient was noted to be acutely hyponatremic, dehydrated and acute kidney injury and therefore hospitalist services were contacted further treatment and evaluation. As per the wife patient has also been having diarrhea since yesterday as he's had over 10 episodes which have been nonbloody in nature.  HOSPITAL COURSE:   70 year old male with past medical history of COPD, chronic respiratory failure, history of MAI infection, lung cancer, previous history of hyponatremia presents to the hospital due to weakness, difficulty walking poor by mouth intake and worsening lower extremity edema.  * Acute on chronic resp failure - Resolved. Now on RA. Acute on chronic diastolic chf PO lasix at discharge Daily weight.  * Vomiting Had gastric ileus on x-ray. NG tube placed Repeat xray showed resolution D/C NGT. Now tolerating diet  * Acute encephalopathy CT head normal. Ammonia, B12, TSH normal. Appreciate neurology input. Most likely ICU delirium. Off Precedex.  * Acute on chronic anemia Likely chronic blood loss from hemmorhoids Transfused 1 unit PRBC and HB improved.  Colonoscopy in December 2016 showed. Mild sigmoid colon diverticulosis.Small external hemorrhoids  * Hyponatremia - Resolved  * Acute kidney injury secondary to dehydration.  Resolved  * MAI infection- rifampin, ethambutol, Zithromax held while acutely ill - infectious disease on board F/U with ID in 1-2 weeks to reasses  and restart meds  * Diarrhea- Resolved - C diff and Gi panel are negative.   * HTN -   anti-HTN held. Metoprolol restarted  Stable for discharge to SNF  Palliative care to follow at SNF  CONSULTS OBTAINED:  Treatment Team:  Leonel Ramsay, MD Lavonia Dana, MD Leotis Pain, MD  DRUG ALLERGIES:   Allergies   Allergen Reactions  . Other Other (See Comments)    Hydromet cough syrup-causes GI upset  . Codeine Nausea And Vomiting    DISCHARGE MEDICATIONS:   Current Discharge Medication List    START taking these medications   Details  feeding supplement, ENSURE ENLIVE, (ENSURE ENLIVE) LIQD Take 237 mLs by mouth 2 (two) times daily between meals. Qty: 237 mL, Refills: 12    Magnesium Oxide 400 (240 Mg) MG TABS Take 400 mg by mouth daily. Qty: 30 tablet    predniSONE (DELTASONE) 20 MG tablet Take 1 tablet (20 mg total) by mouth daily with breakfast. Qty: 4 tablet, Refills: 0      CONTINUE these medications which have CHANGED   Details  furosemide (LASIX) 20 MG tablet Take 2 tablets (40 mg total) by mouth daily. Qty: 30 tablet    traMADol (ULTRAM) 50 MG tablet Take 1 tablet (50 mg total) by mouth every 6 (six) hours as needed. Takes 2 tablets 4 times daily as needed for pain. Qty: 20 tablet, Refills: 0      CONTINUE these medications which have NOT CHANGED   Details  acetaminophen (TYLENOL) 325 MG tablet Take 650 mg by mouth as needed.      albuterol (PROVENTIL HFA;VENTOLIN HFA) 108 (90 Base) MCG/ACT inhaler Inhale 2 puffs into the lungs every 6 (six) hours as needed for wheezing or shortness of breath. Qty: 1 Inhaler, Refills: 12    aspirin 81 MG EC tablet Take 81 mg by mouth daily. Swallow whole.    atorvastatin (LIPITOR) 10 MG tablet TAKE 1 TABLET BY MOUTH EVERY DAY Qty: 90 tablet, Refills: 3    Fluticasone-Salmeterol (ADVAIR DISKUS) 100-50 MCG/DOSE AEPB Inhale 1 puff into the lungs 2 (two) times daily. Qty: 60 each, Refills: 12    guaiFENesin (MUCINEX) 600 MG 12 hr tablet Take 600 mg by mouth daily.     ondansetron (ZOFRAN) 4 MG tablet Take 1 tablet (4 mg total) by mouth every 6 (six) hours as needed for nausea. Qty: 60 tablet, Refills: 4      STOP taking these medications     azithromycin (ZITHROMAX) 500 MG tablet      ethambutol (MYAMBUTOL) 400 MG tablet       losartan (COZAAR) 100 MG tablet      rifampin (RIFADIN) 300 MG capsule         Today   VITAL SIGNS:  Blood pressure (!) 144/65, pulse 97, temperature 97.8 F (36.6 C), resp. rate 18, height '5\' 7"'$  (1.702 m), weight 68.3 kg (150 lb 9.2 oz), SpO2 98 %.  I/O:   Intake/Output Summary (Last 24 hours) at 07/29/16 1018 Last data filed at 07/29/16 0900  Gross per 24 hour  Intake              840 ml  Output             2100 ml  Net            -1260 ml    PHYSICAL EXAMINATION:  Physical Exam  GENERAL:  70 y.o.-year-old patient lying in the bed with no acute distress.  LUNGS: Normal breath sounds bilaterally, no wheezing, rales,rhonchi or crepitation. No use of accessory muscles of respiration.  CARDIOVASCULAR: S1, S2 normal. No murmurs, rubs, or gallops.  ABDOMEN: Soft, non-tender, non-distended. Bowel sounds present. No organomegaly or mass.  NEUROLOGIC: Moves all 4 extremities. PSYCHIATRIC: The patient is alert and awake SKIN: No obvious rash, lesion, or ulcer.   DATA REVIEW:   CBC  Recent Labs Lab 07/27/16 0411  WBC 12.6*  HGB 8.8*  HCT 27.1*  PLT 399    Chemistries   Recent Labs Lab 07/24/16 0435  07/29/16 0919  NA 137  < > 141  K 3.9  < > 3.3*  CL 106  < > 100*  CO2 19*  < > 29  GLUCOSE 89  < > 103*  BUN 6  < > 21*  CREATININE 0.82  < > 1.01  CALCIUM 8.1*  < > 8.9  MG  --   < > 1.6*  AST 28  --   --   ALT 22  --   --   ALKPHOS 38  --   --   BILITOT 0.5  --   --   < > = values in this interval not displayed.  Cardiac Enzymes  Recent Labs Lab 07/23/16 0309  TROPONINI 0.09*    Microbiology Results  Results for orders placed or performed during the hospital encounter of 07/20/16  Gastrointestinal Panel by PCR , Stool     Status: None   Collection Time: 07/20/16  6:40 PM  Result Value Ref Range Status   Campylobacter species NOT DETECTED NOT DETECTED Final   Plesimonas shigelloides NOT DETECTED NOT DETECTED Final   Salmonella species NOT  DETECTED NOT DETECTED Final   Yersinia enterocolitica NOT DETECTED NOT DETECTED Final   Vibrio species NOT DETECTED NOT DETECTED Final   Vibrio cholerae NOT DETECTED NOT DETECTED Final   Enteroaggregative E coli (EAEC) NOT DETECTED NOT DETECTED Final   Enteropathogenic E coli (EPEC) NOT DETECTED NOT DETECTED Final   Enterotoxigenic E coli (ETEC) NOT DETECTED NOT DETECTED Final   Shiga like toxin producing E coli (STEC) NOT DETECTED NOT DETECTED Final   Shigella/Enteroinvasive E coli (EIEC) NOT DETECTED NOT DETECTED Final   Cryptosporidium NOT DETECTED NOT DETECTED Final   Cyclospora cayetanensis NOT DETECTED NOT DETECTED Final   Entamoeba histolytica NOT DETECTED NOT DETECTED Final   Giardia lamblia NOT DETECTED NOT DETECTED Final   Adenovirus F40/41 NOT DETECTED NOT DETECTED Final   Astrovirus NOT DETECTED NOT DETECTED Final   Norovirus GI/GII NOT DETECTED NOT DETECTED Final   Rotavirus A NOT DETECTED NOT DETECTED Final   Sapovirus (I, II, IV, and V) NOT DETECTED NOT DETECTED Final  C difficile quick scan w PCR reflex     Status: None   Collection Time: 07/20/16  6:40 PM  Result Value Ref Range Status   C Diff antigen NEGATIVE NEGATIVE Final   C Diff toxin NEGATIVE NEGATIVE Final   C Diff interpretation No C. difficile detected.  Final  Urine culture     Status: None   Collection Time: 07/22/16 12:45 PM  Result Value Ref Range Status   Specimen Description URINE, RANDOM  Final   Special Requests NONE  Final   Culture NO GROWTH Performed at [facility]   Final   Report Status 07/24/2016 FINAL  Final  MRSA PCR Screening     Status: None   Collection Time: 07/22/16  2:22 PM  Result Value Ref Range Status  MRSA by PCR NEGATIVE NEGATIVE Final    Comment:        The GeneXpert MRSA Assay (FDA approved for NASAL specimens only), is one component of a comprehensive MRSA colonization surveillance program. It is not intended to diagnose MRSA infection nor to guide  or monitor treatment for MRSA infections.     RADIOLOGY:  Dg Abd 2 Views  Result Date: 07/27/2016 CLINICAL DATA:  Gastric distension, nasogastric tube in place. History of asthma- COPD, MAC infection EXAM: ABDOMEN - 2 VIEW COMPARISON:  KUB of July 26, 2016 FINDINGS: The NG tube is in reasonable position and the previously demonstrated gaseous distention has been alleviated. A small amount of gas and fluid is present within the stomach. The bowel gas pattern is normal. There are degenerative changes at multiple lumbar disc levels. There are coarse interstitial lung markings at both bases with an area of confluence on the left which is stable. IMPRESSION: Interval decompression of the previously distended stomach by the nasogastric tube. Electronically Signed   By: David  Martinique M.D.   On: 07/27/2016 16:15    Follow up with PCP in 1 week.  Management plans discussed with the patient, family and they are in agreement.  CODE STATUS:     Code Status Orders        Start     Ordered   07/20/16 1545  Full code  Continuous     07/20/16 1544    Code Status History    Date Active Date Inactive Code Status Order ID Comments User Context   06/28/2016 10:37 PM 06/30/2016  4:27 PM Full Code [id number]  [employer], DO Inpatient      TOTAL TIME TAKING CARE OF THIS PATIENT ON DAY OF DISCHARGE: more than 30 minutes.   [name] R M.D on 07/29/2016 at 10:18 AM  Between 7am to 6pm - Pager - [phone]  After 6pm go to [email or web address] - password [email or web address] [facility]  Office  [phone]  CC: Primary care physician; Asencion Noble, MD  Note: This dictation was prepared with Dragon dictation along with smaller phrase technology. Any transcriptional errors that result from this process are unintentional.

## 2016-07-29 NOTE — Progress Notes (Signed)
Speech Language Pathology Treatment: Dysphagia  Patient Details Name: [name] MRN: [id number] DOB: Apr 18, 1946 Today's Date: 07/29/2016 Time: 5681-2751 SLP Time Calculation (min) (ACUTE ONLY): 45 min  Assessment / Plan / Recommendation Clinical Impression  Pt appeared to adequately tolerate trials of thin liquids via cup and straw, purees and soft solids(well-cut foods) w/ no immediate, overt s/s of aspiration noted; clear vocal quality b/t trials when assessed, no immediate coughing post trials. Pt presented w/ a mildly congested cough at rest prior to po's; this was noted x1-2 during the session but not immediate to a po trial. Oral phase was c/b min increased mastication time w/ increase texture of foods(bread and cheese); pt tended to hold the food longer orally and tuck it to one side while distracted by environmental stimuli or conversation. Pt was given verbal cues to reattend to the food bolus in his mouth to chew and clear orally. Pt fed self w/ tray setup and support. Pt appears at reduced risk for aspiration following general aspiration precautions. If any increased increased oral phase time/holding or coughing when pt is mixing foods and liquids orally to aid swallowing, then would strongly suggest reducing diet consistency of foods and use small sips/bites; moisten foods well. Attempt a Dysphagia 3 diet consistency for increased options of foods (to increase interest in food/appetite) but monitor oral phase of bolus management for appropriate oral clearing. General aspiration precautions; medication given in puree for easier swallowing. ST will continue to monitor diet toleration and give ongoing  Education to pt and Wife. Discussed w/ Wife the option of Dietician f/u for supplement support d/t decreased oral intake; N/V. Wife agreed.      HPI HPI: Pt is a 70 y.o. male with a known history of COPD, MAI infection, history of small cell carcinoma of the lung, chronic respiratory failure,  previous history of hyponatremia, history of coronary artery disease, previous history of DVT who presents to the hospital due to worsening lower extremity edema, weakness and difficulty walking and poor by mouth intake. Patient was recently discharged from the hospital about 2-3 weeks ago after treatment for a COPD flare. Patient has a chronic respiratory infection secondary to MAI and is currently on long-term antibiotics and follows with Dr. Ola Spurr and was recently seen by him in [facility] on oral Lasix for his lower extremity edema. As per the wife despite being on the Lasix patient's weakness and low some edema has not improved and therefore she brought him to the ER for further evaluation. Patient was noted to be acutely hyponatremic, dehydrated and acute kidney injury and therefore hospitalist services were contacted further treatment and evaluation. As per the wife patient has also been having N/V and diarrhea since yesterday as he's had over 10 episodes which have been nonbloody in nature. Wife attributes the N/V and decreased oral intake to the newly initiated triple anitbiotic regimen he is on for his Lung issues. Post admission, pt was transferred to the CCU d/t decline in status. An NG tube was placed post N/V and discovery of an ileus. NG was removed on 07/28/16 and dysphagia level 2 diet w/ thin liquids reinitiated. Wife stated he has been drinking liquids but only taking bites of foods.       SLP Plan  Continue with current plan of care     Recommendations  Diet recommendations: Dysphagia 3 (mechanical soft);Thin liquid Liquids provided via: Cup;Straw Medication Administration: Crushed with puree Supervision: Staff to assist with self feeding;Full supervision/cueing for compensatory strategies  Compensations: Minimize environmental distractions;Slow rate;Small sips/bites;Lingual sweep for clearance of pocketing;Follow solids with liquid Postural Changes and/or Swallow Maneuvers: Seated  upright 90 degrees;Upright 30-60 min after meal                Oral Care Recommendations: Oral care BID;Staff/trained caregiver to provide oral care Follow up Recommendations: Skilled Nursing facility Plan: Continue with current plan of care       [place], [place], CCC-SLP  Watson,Katherine 07/29/2016, 2:51 PM

## 2016-07-30 ENCOUNTER — Other Ambulatory Visit: Payer: Medicare Other

## 2016-07-30 DIAGNOSIS — I509 Heart failure, unspecified: Secondary | ICD-10-CM | POA: Diagnosis not present

## 2016-07-30 DIAGNOSIS — G9340 Encephalopathy, unspecified: Secondary | ICD-10-CM | POA: Diagnosis not present

## 2016-07-30 DIAGNOSIS — E46 Unspecified protein-calorie malnutrition: Secondary | ICD-10-CM | POA: Diagnosis not present

## 2016-07-30 DIAGNOSIS — J449 Chronic obstructive pulmonary disease, unspecified: Secondary | ICD-10-CM | POA: Diagnosis not present

## 2016-07-30 DIAGNOSIS — C3412 Malignant neoplasm of upper lobe, left bronchus or lung: Secondary | ICD-10-CM | POA: Diagnosis not present

## 2016-07-30 DIAGNOSIS — A310 Pulmonary mycobacterial infection: Secondary | ICD-10-CM | POA: Diagnosis not present

## 2016-07-31 ENCOUNTER — Telehealth: Payer: Self-pay | Admitting: Cardiovascular Disease

## 2016-07-31 DIAGNOSIS — I252 Old myocardial infarction: Secondary | ICD-10-CM | POA: Diagnosis not present

## 2016-07-31 DIAGNOSIS — Z515 Encounter for palliative care: Secondary | ICD-10-CM | POA: Diagnosis not present

## 2016-07-31 DIAGNOSIS — J9610 Chronic respiratory failure, unspecified whether with hypoxia or hypercapnia: Secondary | ICD-10-CM | POA: Diagnosis not present

## 2016-07-31 DIAGNOSIS — I2510 Atherosclerotic heart disease of native coronary artery without angina pectoris: Secondary | ICD-10-CM

## 2016-07-31 DIAGNOSIS — I10 Essential (primary) hypertension: Secondary | ICD-10-CM

## 2016-07-31 DIAGNOSIS — J449 Chronic obstructive pulmonary disease, unspecified: Secondary | ICD-10-CM | POA: Diagnosis not present

## 2016-07-31 NOTE — Telephone Encounter (Signed)
Spoke with Wife [name].  Patient had echo 07-20-16 at [facility] while inpatient.

## 2016-08-04 LAB — CBC WITH DIFFERENTIAL/PLATELET
BASOS ABS: 0.1 10*3/uL (ref 0–0.1)
BASOS PCT: 1 %
EOS PCT: 5 %
Eosinophils Absolute: 0.9 10*3/uL — ABNORMAL HIGH (ref 0–0.7)
HEMATOCRIT: 31.2 % — AB (ref 40.0–52.0)
Hemoglobin: 10.2 g/dL — ABNORMAL LOW (ref 13.0–18.0)
Lymphocytes Relative: 7 %
Lymphs Abs: 1.3 10*3/uL (ref 1.0–3.6)
MCH: 27.9 pg (ref 26.0–34.0)
MCHC: 32.5 g/dL (ref 32.0–36.0)
MCV: 85.7 fL (ref 80.0–100.0)
MONO ABS: 1.5 10*3/uL — AB (ref 0.2–1.0)
MONOS PCT: 7 %
Neutro Abs: 16.1 10*3/uL — ABNORMAL HIGH (ref 1.4–6.5)
Neutrophils Relative %: 80 %
PLATELETS: 554 10*3/uL — AB (ref 150–440)
RBC: 3.64 MIL/uL — ABNORMAL LOW (ref 4.40–5.90)
RDW: 16.6 % — AB (ref 11.5–14.5)
WBC: 20 10*3/uL — ABNORMAL HIGH (ref 3.8–10.6)

## 2016-08-04 LAB — BASIC METABOLIC PANEL
ANION GAP: 13 (ref 5–15)
BUN: 15 mg/dL (ref 6–20)
CALCIUM: 8.6 mg/dL — AB (ref 8.9–10.3)
CO2: 29 mmol/L (ref 22–32)
CREATININE: 0.81 mg/dL (ref 0.61–1.24)
Chloride: 94 mmol/L — ABNORMAL LOW (ref 101–111)
GLUCOSE: 107 mg/dL — AB (ref 65–99)
Potassium: 2.9 mmol/L — ABNORMAL LOW (ref 3.5–5.1)
Sodium: 136 mmol/L (ref 135–145)

## 2016-08-05 LAB — URINALYSIS COMPLETE WITH MICROSCOPIC (ARMC ONLY)
BACTERIA UA: NONE SEEN
Bilirubin Urine: NEGATIVE
GLUCOSE, UA: 50 mg/dL — AB
Ketones, ur: NEGATIVE mg/dL
Leukocytes, UA: NEGATIVE
NITRITE: NEGATIVE
PH: 7 (ref 5.0–8.0)
PROTEIN: NEGATIVE mg/dL
Specific Gravity, Urine: 1.012 (ref 1.005–1.030)
Squamous Epithelial / LPF: NONE SEEN

## 2016-08-06 LAB — URINE CULTURE: CULTURE: NO GROWTH

## 2016-08-12 LAB — CBC WITH DIFFERENTIAL/PLATELET
BASOS ABS: 0.2 10*3/uL — AB (ref 0–0.1)
Basophils Relative: 1 %
Eosinophils Absolute: 1.4 10*3/uL — ABNORMAL HIGH (ref 0–0.7)
Eosinophils Relative: 10 %
HCT: 25.2 % — ABNORMAL LOW (ref 40.0–52.0)
HEMOGLOBIN: 8.4 g/dL — AB (ref 13.0–18.0)
LYMPHS ABS: 1 10*3/uL (ref 1.0–3.6)
LYMPHS PCT: 7 %
MCH: 28.2 pg (ref 26.0–34.0)
MCHC: 33.1 g/dL (ref 32.0–36.0)
MCV: 85.1 fL (ref 80.0–100.0)
Monocytes Absolute: 1.1 10*3/uL — ABNORMAL HIGH (ref 0.2–1.0)
Monocytes Relative: 8 %
NEUTROS ABS: 10.3 10*3/uL — AB (ref 1.4–6.5)
NEUTROS PCT: 74 %
Platelets: 402 10*3/uL (ref 150–440)
RBC: 2.96 MIL/uL — AB (ref 4.40–5.90)
RDW: 16.4 % — ABNORMAL HIGH (ref 11.5–14.5)
WBC: 13.9 10*3/uL — AB (ref 3.8–10.6)

## 2016-08-12 LAB — COMPREHENSIVE METABOLIC PANEL
ALK PHOS: 51 U/L (ref 38–126)
ALT: 30 U/L (ref 17–63)
AST: 23 U/L (ref 15–41)
Albumin: 3 g/dL — ABNORMAL LOW (ref 3.5–5.0)
Anion gap: 12 (ref 5–15)
BUN: 9 mg/dL (ref 6–20)
CALCIUM: 8.9 mg/dL (ref 8.9–10.3)
CHLORIDE: 97 mmol/L — AB (ref 101–111)
CO2: 25 mmol/L (ref 22–32)
CREATININE: 0.86 mg/dL (ref 0.61–1.24)
Glucose, Bld: 92 mg/dL (ref 65–99)
Potassium: 3.5 mmol/L (ref 3.5–5.1)
Sodium: 134 mmol/L — ABNORMAL LOW (ref 135–145)
Total Bilirubin: 0.9 mg/dL (ref 0.3–1.2)
Total Protein: 6.1 g/dL — ABNORMAL LOW (ref 6.5–8.1)

## 2016-08-18 ENCOUNTER — Non-Acute Institutional Stay (SKILLED_NURSING_FACILITY): Payer: Medicare Other | Admitting: Gerontology

## 2016-08-18 DIAGNOSIS — D500 Iron deficiency anemia secondary to blood loss (chronic): Secondary | ICD-10-CM | POA: Diagnosis not present

## 2016-08-18 DIAGNOSIS — J9600 Acute respiratory failure, unspecified whether with hypoxia or hypercapnia: Secondary | ICD-10-CM | POA: Diagnosis not present

## 2016-08-18 LAB — CBC WITH DIFFERENTIAL/PLATELET
BASOS ABS: 0.1 10*3/uL (ref 0–0.1)
BASOS PCT: 1 %
Eosinophils Absolute: 1.5 10*3/uL — ABNORMAL HIGH (ref 0–0.7)
Eosinophils Relative: 15 %
HEMATOCRIT: 27.7 % — AB (ref 40.0–52.0)
HEMOGLOBIN: 9.1 g/dL — AB (ref 13.0–18.0)
LYMPHS PCT: 9 %
Lymphs Abs: 0.9 10*3/uL — ABNORMAL LOW (ref 1.0–3.6)
MCH: 28.4 pg (ref 26.0–34.0)
MCHC: 32.9 g/dL (ref 32.0–36.0)
MCV: 86.3 fL (ref 80.0–100.0)
MONO ABS: 0.7 10*3/uL (ref 0.2–1.0)
Monocytes Relative: 7 %
NEUTROS ABS: 7.1 10*3/uL — AB (ref 1.4–6.5)
NEUTROS PCT: 68 %
Platelets: 359 10*3/uL (ref 150–440)
RBC: 3.21 MIL/uL — ABNORMAL LOW (ref 4.40–5.90)
RDW: 16.3 % — ABNORMAL HIGH (ref 11.5–14.5)
WBC: 10.4 10*3/uL (ref 3.8–10.6)

## 2016-08-19 ENCOUNTER — Inpatient Hospital Stay: Payer: Medicare Other | Admitting: Internal Medicine

## 2016-08-24 DIAGNOSIS — J349 Unspecified disorder of nose and nasal sinuses: Secondary | ICD-10-CM | POA: Diagnosis not present

## 2016-08-24 DIAGNOSIS — I5033 Acute on chronic diastolic (congestive) heart failure: Secondary | ICD-10-CM | POA: Diagnosis not present

## 2016-08-24 DIAGNOSIS — J45909 Unspecified asthma, uncomplicated: Secondary | ICD-10-CM | POA: Diagnosis not present

## 2016-08-24 DIAGNOSIS — C3492 Malignant neoplasm of unspecified part of left bronchus or lung: Secondary | ICD-10-CM | POA: Diagnosis not present

## 2016-08-24 DIAGNOSIS — A310 Pulmonary mycobacterial infection: Secondary | ICD-10-CM | POA: Diagnosis not present

## 2016-08-24 DIAGNOSIS — I110 Hypertensive heart disease with heart failure: Secondary | ICD-10-CM | POA: Diagnosis not present

## 2016-08-24 DIAGNOSIS — J439 Emphysema, unspecified: Secondary | ICD-10-CM | POA: Diagnosis not present

## 2016-08-26 DIAGNOSIS — I10 Essential (primary) hypertension: Secondary | ICD-10-CM | POA: Diagnosis not present

## 2016-08-26 DIAGNOSIS — J9600 Acute respiratory failure, unspecified whether with hypoxia or hypercapnia: Secondary | ICD-10-CM | POA: Diagnosis not present

## 2016-08-26 DIAGNOSIS — A319 Mycobacterial infection, unspecified: Secondary | ICD-10-CM | POA: Diagnosis not present

## 2016-08-26 NOTE — Progress Notes (Signed)
Location:      Place of Service:  SNF (31)  Provider: Toni Arthurs, NP-C  PCP: Asencion Noble, MD Patient Care Team: Asencion Noble, MD as PCP - General (Internal Medicine) Minna Merritts, MD (Cardiology)  Extended Emergency Contact Information Primary Emergency Contact: Buehrer,[name] Address: [address] [place]          [place], [place] [address] Johnnette Litter of [place] Phone: [phone] Mobile Phone: [phone] Relation: Spouse  Code Status: dnr Goals of care:  Advanced Directive information Advanced Directives 07/20/2016  Does Patient Have a Medical Advance Directive? No  Type of Advance Directive -  Copy of [place] in Chart? -  Would patient like information on creating a medical advance directive? No - patient declined information     Allergies  Allergen Reactions  . Other Other (See Comments)    Hydromet cough syrup-causes GI upset  . Codeine Nausea And Vomiting    Chief Complaint  Patient presents with  . Discharge Note    HPI:  70 y.o. male seen for discharge evaluation.  Mr. Lippmann was admitted with acute respiratory failure. Received PT/OT for decreased mobility and independence with ADL's. Received ST for swallowing difficulties and aspiration. Improvement was noted and diet upgraded to regular. H/O anemia. Hgb low at admission and follow up. Recommended to pt and wife to get labs drawn, ran stat to evaluated Hgb. Results- Hgb improved/ stable. Stable at time of discharge. Prescriptions faxed to [facility] on [address]. Narcotic rx given to resident. Personal belongings gathered by resident. Discharging home with [facility] PT/OT/Nursing. No DME needed at this time. VSS. No other complaints.     Past Medical History:  Diagnosis Date  . Acute MI   . Arthritis of lumbar spine ([place])   . Asthma   . Clotting disorder ([place])   . Collapsed lung    x3  . COPD (chronic obstructive pulmonary disease) ([place])   . Dyspnea   . History of benign thyroid  tumor   . Lung cancer ([place])   . Mycobacterium avium complex ([place]) 06/24/2016  . Pneumonia   . Pneumothorax   . Small cell carcinoma of lung ([place])   . Spontaneous pneumothorax     Past Surgical History:  Procedure Laterality Date  . APPENDECTOMY    . benign thryoid nodule resected    . CARDIAC CATHETERIZATION  2013   [facility]  . CHEST TUBE INSERTION    . COLONOSCOPY N/A 08/30/2015   Procedure: COLONOSCOPY;  Surgeon: Rogene Houston, MD;  Location: [facility] ENDO SU[facility];  Service: Endoscopy;  Laterality: N/A;  1:45  . left upper lobectomy for small cell lung cancer    . VATS R thoracotomy        reports that he quit smoking about 24 years ago. His smoking use included Cigars. He has a 90.00 pack-year smoking history. He has never used smokeless tobacco. He reports that he does not drink alcohol or use drugs. Social History   Social History  . Marital status: Married    Spouse name: N/A  . Number of children: 1  . Years of education: N/A   Occupational History  . 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 Smoking status: Former Smoker    Packs/day: 3.00    Years: 30.00    Types: Cigars    Quit date: 03/02/1992  . Smokeless tobacco: Never Used     Comment: quit 22 yrs  ago  . Alcohol use No  . Drug use: No  . Sexual activity: Yes   Other Topics Concern  . Not on file   Social History Narrative  . No narrative on file   Functional Status Survey:    Allergies  Allergen Reactions  . Other Other (See Comments)    Hydromet cough syrup-causes GI upset  . Codeine Nausea And Vomiting    Pertinent  Health Maintenance Due  Topic Date Due  . PNA vac Low Risk Adult (2 of 2 - PPSV23) 10/13/2014  . COLONOSCOPY  08/29/2025  . INFLUENZA VACCINE  Completed    Medications:   Medication List       Accurate as of 08/18/16 11:59 PM. Always use your most recent med list.          acetaminophen 325 MG tablet Commonly known as:  TYLENOL Take 650 mg by  mouth as needed.   albuterol 108 (90 Base) MCG/ACT inhaler Commonly known as:  PROVENTIL HFA;VENTOLIN HFA Inhale 2 puffs into the lungs every 6 (six) hours as needed for wheezing or shortness of breath.   aspirin 81 MG EC tablet Take 81 mg by mouth daily. Swallow whole.   atorvastatin 10 MG tablet Commonly known as:  LIPITOR TAKE 1 TABLET BY MOUTH EVERY DAY   feeding supplement (ENSURE ENLIVE) Liqd Take 237 mLs by mouth 2 (two) times daily between meals.   Fluticasone-Salmeterol 100-50 MCG/DOSE Aepb Commonly known as:  ADVAIR DISKUS Inhale 1 puff into the lungs 2 (two) times daily.   furosemide 20 MG tablet Commonly known as:  LASIX Take 2 tablets (40 mg total) by mouth daily.   guaiFENesin 600 MG 12 hr tablet Commonly known as:  MUCINEX Take 600 mg by mouth daily.   Magnesium Oxide 400 (240 Mg) MG Tabs Take 400 mg by mouth daily.   ondansetron 4 MG tablet Commonly known as:  ZOFRAN Take 1 tablet (4 mg total) by mouth every 6 (six) hours as needed for nausea.   predniSONE 20 MG tablet Commonly known as:  DELTASONE Take 1 tablet (20 mg total) by mouth daily with breakfast.   traMADol 50 MG tablet Commonly known as:  ULTRAM Take 1 tablet (50 mg total) by mouth every 6 (six) hours as needed. Takes 2 tablets 4 times daily as needed for pain.       Review of Systems  Constitutional: Negative for activity change, appetite change, chills, diaphoresis and fever.  HENT: Negative for congestion, sneezing, sore throat, trouble swallowing and voice change.   Eyes: Negative.   Respiratory: Negative for apnea, cough, choking, chest tightness, shortness of breath and wheezing.   Cardiovascular: Negative for chest pain, palpitations and leg swelling.  Gastrointestinal: Negative for abdominal distention, abdominal pain, constipation, diarrhea and nausea.  Endocrine: Negative.   Genitourinary: Negative for difficulty urinating, dysuria, frequency and urgency.  Musculoskeletal:  Negative for arthralgias (typical arthritis), back pain, gait problem and myalgias.  Skin: Negative for color change, pallor, rash and wound.  Neurological: Negative for dizziness, tremors, syncope, speech difficulty, weakness, numbness and headaches.  Psychiatric/Behavioral: Negative for agitation and behavioral problems.  All other systems reviewed and are negative.   Vitals:   08/18/16 0500  BP: (!) 113/51  Pulse: 63  Resp: 16  Temp: 98.3 F (36.8 C)  SpO2: 94%  Weight: 149 lb (67.6 kg)   Body mass index is 23.34 kg/m. Physical Exam  Constitutional: He is oriented to person, place, and time. Vital signs are  normal. He appears well-developed and well-nourished. He is active and cooperative. He does not appear ill. No distress.  HENT:  Head: Normocephalic and atraumatic.  Mouth/Throat: Uvula is midline, oropharynx is clear and moist and mucous membranes are normal. Mucous membranes are not pale, not dry and not cyanotic.  Eyes: Conjunctivae, EOM and lids are normal. Pupils are equal, round, and reactive to light.  Neck: Trachea normal, normal range of motion and full passive range of motion without pain. Neck supple. No JVD present. No tracheal deviation, no edema and no erythema present. No thyromegaly present.  Cardiovascular: Normal rate, regular rhythm, normal heart sounds, intact distal pulses and normal pulses.  Exam reveals no gallop, no distant heart sounds and no friction rub.   No murmur heard. Pulmonary/Chest: Effort normal and breath sounds normal. No accessory muscle usage. No respiratory distress. He has no wheezes. He has no rales. He exhibits no tenderness.  Abdominal: Normal appearance and bowel sounds are normal. He exhibits no distension and no ascites. There is no tenderness.  Musculoskeletal: Normal range of motion. He exhibits no edema or tenderness.  Expected osteoarthritis, stiffness  Neurological: He is alert and oriented to person, place, and time. He has  normal strength.  Skin: Skin is warm, dry and intact. No rash noted. He is not diaphoretic. No cyanosis or erythema. No pallor. Nails show no clubbing.  Psychiatric: He has a normal mood and affect. His speech is normal and behavior is normal. Judgment and thought content normal. Cognition and memory are normal.  Nursing note and vitals reviewed.   Labs reviewed: Basic Metabolic Panel:  Recent Labs  06/28/16 1646  07/28/16 0555 07/29/16 0919 08/04/16 0812 08/12/16 0450  NA 130*  < > 144 141 136 134*  K 3.5  < > 2.9* 3.3* 2.9* 3.5  CL 98*  < > 103 100* 94* 97*  CO2 23  < > '28 29 29 25  '$ GLUCOSE 112*  < > 95 103* 107* 92  BUN 18  < > 13 21* 15 9  CREATININE 1.18  < > 1.08 1.01 0.81 0.86  CALCIUM 9.4  < > 9.0 8.9 8.6* 8.9  MG 1.6*  --  1.4* 1.6*  --   --   PHOS 3.7  --   --   --   --   --   < > = values in this interval not displayed. Liver Function Tests:  Recent Labs  07/23/16 0309 07/24/16 0435 08/12/16 0450  AST 35 28 23  ALT '23 22 30  '$ ALKPHOS 41 38 51  BILITOT 0.6 0.5 0.9  PROT 5.6* 5.4* 6.1*  ALBUMIN 2.4* 2.2* 3.0*   No results for input(s): LIPASE, AMYLASE in the last 8760 hours.  Recent Labs  07/23/16 1833  AMMONIA 22   CBC:  Recent Labs  08/04/16 0812 08/12/16 0450 08/18/16 1100  WBC 20.0* 13.9* 10.4  NEUTROABS 16.1* 10.3* 7.1*  HGB 10.2* 8.4* 9.1*  HCT 31.2* 25.2* 27.7*  MCV 85.7 85.1 86.3  PLT 554* 402 359   Cardiac Enzymes:  Recent Labs  07/22/16 1443 07/22/16 2131 07/23/16 0309  TROPONINI 0.11* 0.07* 0.09*   BNP: Invalid input(s): POCBNP CBG:  Recent Labs  07/21/16 0028 07/22/16 1427 07/23/16 1843  GLUCAP 105* 108* 81    Procedures and Imaging Studies During Stay: No results found.  Assessment/Plan:   1. Acute respiratory failure, unspecified whether with hypoxia or hypercapnia (HCC)  Stable  Continue HH PT/OT for strengthening/ deconditioning  Follow  up with PCP asap  2. Iron deficiency anemia due to chronic  blood loss  Assess cbc prior to d/c  Follow up with PCP/ hematologist asap after d/c   Patient is being discharged with the following home health services:  [facility] PT/OT/SN  Patient is being discharged with the following durable medical equipment:  none  Patient has been advised to f/u with their PCP in 1-2 weeks to bring them up to date on their rehab stay.  Social services at facility was responsible for arranging this appointment.  Pt was provided with a 30 day supply of prescriptions for medications and refills must be obtained from their PCP.  For controlled substances, a more limited supply may be provided adequate until PCP appointment only.  Future labs/tests needed:  Check CBC prior to discharge, then per PCP  Family/ staff Communication:   Total Time:  Documentation:  Face to Face:  Family/Phone:  Vikki Ports, NP-C [facility] [address], [place] [address] Cell Phone (Mon-Fri 8am-5pm):  [phone] On Call:  [phone] & follow prompts after 5pm & weekends Office Phone:  [phone] Office Fax:  [phone]

## 2016-08-28 ENCOUNTER — Encounter: Admission: RE | Admit: 2016-08-28 | Payer: Medicare Other | Source: Ambulatory Visit | Admitting: Internal Medicine

## 2016-08-28 DIAGNOSIS — C3492 Malignant neoplasm of unspecified part of left bronchus or lung: Secondary | ICD-10-CM | POA: Diagnosis not present

## 2016-08-28 DIAGNOSIS — I5033 Acute on chronic diastolic (congestive) heart failure: Secondary | ICD-10-CM | POA: Diagnosis not present

## 2016-08-28 DIAGNOSIS — A310 Pulmonary mycobacterial infection: Secondary | ICD-10-CM | POA: Diagnosis not present

## 2016-08-28 DIAGNOSIS — J439 Emphysema, unspecified: Secondary | ICD-10-CM | POA: Diagnosis not present

## 2016-08-28 DIAGNOSIS — I110 Hypertensive heart disease with heart failure: Secondary | ICD-10-CM | POA: Diagnosis not present

## 2016-08-28 DIAGNOSIS — J45909 Unspecified asthma, uncomplicated: Secondary | ICD-10-CM | POA: Diagnosis not present

## 2016-08-31 ENCOUNTER — Ambulatory Visit (INDEPENDENT_AMBULATORY_CARE_PROVIDER_SITE_OTHER): Payer: Medicare Other | Admitting: Cardiovascular Disease

## 2016-08-31 ENCOUNTER — Encounter: Payer: Self-pay | Admitting: Cardiovascular Disease

## 2016-08-31 VITALS — BP 104/58 | HR 95 | Ht 67.0 in | Wt 154.0 lb

## 2016-08-31 DIAGNOSIS — J441 Chronic obstructive pulmonary disease with (acute) exacerbation: Secondary | ICD-10-CM | POA: Diagnosis not present

## 2016-08-31 DIAGNOSIS — D508 Other iron deficiency anemias: Secondary | ICD-10-CM

## 2016-08-31 DIAGNOSIS — I2510 Atherosclerotic heart disease of native coronary artery without angina pectoris: Secondary | ICD-10-CM | POA: Diagnosis not present

## 2016-08-31 DIAGNOSIS — I158 Other secondary hypertension: Secondary | ICD-10-CM

## 2016-08-31 DIAGNOSIS — A310 Pulmonary mycobacterial infection: Secondary | ICD-10-CM

## 2016-08-31 DIAGNOSIS — I25111 Atherosclerotic heart disease of native coronary artery with angina pectoris with documented spasm: Secondary | ICD-10-CM

## 2016-08-31 DIAGNOSIS — E782 Mixed hyperlipidemia: Secondary | ICD-10-CM

## 2016-08-31 NOTE — Progress Notes (Signed)
Echocardiogram 07/20/2016- Cardiology Office Note  Date:  08/31/2016   ID:  [name], [name] Sep 12, 1946, MRN [id number]  PCP:  Asencion Noble, MD   Chief Complaint  Patient presents with  . other    Pt. was at [facility] with COPD & CHF in November 2017.  Meds reviewed by the pt's wife verbally. Pt. c/o shortness of breath.     HPI:  [name] is a pleasant 70 year old gentleman with history of lung cancer, COPD who had a non-ST elevation MI after choking on a vitamin pill, admission to [facility] 05/09/2012 with peak troponin 1.5.  cardiac catheterization that showed nonocclusive coronary artery disease, several regions of 20% stenosis otherwise normal ejection fraction, normal echocardiogram. He presents for routine followup of his coronary artery disease, shortness of breath Lab work in the hospital showed BNP 142, peak troponin 1.5,  CT 05/19/2016 documenting a 7 cm cavity, bulla, left base Treated for mycobacteria, has not tolerated this well  In follow-up today, weight continues to drop He has been in out of the hospital recently Chlorthalidone held, started on Lasix, had poor dietary and fluid intake, Admitted to the hospital with low sodium  Sent  to [facility] for rehabilitation  In the hospital, Echo 07/20/2016   normal ejection fraction Mildly elevated right heart pressures  Lab work reviewed showing chronic Anemia,  HCT 27 Chronic lower extremity edema Patient and wife are concerned he is still Lasix 40 mg daily Wife is pushing fluids, 4 bottles per day  EKG on today's visit shows normal sinus rhythm with rate 94 bpm, prolonged QTC, no significant ST or T-wave changes  He has had new Cough and congestion two days Scheduled to see pulmonology next week, Dr. Ola Spurr tomorrow He does not feel he is strong enough to restart antibiotics for mycobacteria  Other past medical history reviewed Admitted to the hospital 07/20/2016 with hyponatremia, weakness, poor intake Prior hospital  admission for COPD flare, had failure to thrive at home  Previous pneumonia Reports having long course of antibiotics, prednisone  CT 05/19/2016 documenting a 7 cm cavity, bulla, left base Also Atherosclerosis  Other lab work reviewed including Lung Cx: mycobacteria Lab work from October 2016 with normal creatinine, sodium 132, chloride 91, will LFTs, hematocrit 33.9  Previous  problem with his eye, "Clot and bleeding " anticoagulation was held at that time by outside physician. Details are unavailable He has frequent follow-up with pulmonary for his COPD  hospital admission December 12 2011 with discharge 12/16/2013. Final diagnosis was acute respiratory failure, pneumonia with clinical sepsis, DVT with PE. He was started on xarelto initially 15 mg twice a day then changed to 20 mg daily   on chronic steroids for lung disease. Prior lung cancer 15 years ago with treatment at that time  Echocardiogram 12/13/2011 showed normal LV function, moderately elevated right ventricular systolic pressure Ultrasound of the leg showed DVT in the right posterior tibial vein branch otherwise no other DVT CT scan of the chest showed PE of the right middle lobe and posterior basal right lower lobe pulmonary arteries  Prior lab work, Total cholesterol 158, LDL 65, HDL 54  PMH:   has a past medical history of Acute MI; Arthritis of lumbar spine ([place]); Asthma; Clotting disorder ([place]); Collapsed lung; COPD (chronic obstructive pulmonary disease) ([place]); Dyspnea; History of benign thyroid tumor; Lung cancer ([place]); Mycobacterium avium complex ([place]) (06/24/2016); Pneumonia; Pneumothorax; Small cell carcinoma of lung ([place]); and Spontaneous pneumothorax.  PSH:    Past Surgical History:  Procedure Laterality Date  . APPENDECTOMY    . benign thryoid nodule resected    . CARDIAC CATHETERIZATION  2013   [facility]  . CHEST TUBE INSERTION    . COLONOSCOPY N/A 08/30/2015   Procedure: COLONOSCOPY;  Surgeon: Rogene Houston, MD;  Location: [facility] ENDO [facility];  Service: Endoscopy;  Laterality: N/A;  1:45  . left upper lobectomy for small cell lung cancer    . VATS R thoracotomy      Current Outpatient Prescriptions  Medication Sig Dispense Refill  . acetaminophen (TYLENOL) 325 MG tablet Take 650 mg by mouth as needed.      Marland Kitchen albuterol (PROVENTIL HFA;VENTOLIN HFA) 108 (90 Base) MCG/ACT inhaler Inhale 2 puffs into the lungs every 6 (six) hours as needed for wheezing or shortness of breath. 1 Inhaler 12  . aspirin 81 MG EC tablet Take 81 mg by mouth daily. Swallow whole.    Marland Kitchen atorvastatin (LIPITOR) 10 MG tablet TAKE 1 TABLET BY MOUTH EVERY DAY 90 tablet 3  . feeding supplement, ENSURE ENLIVE, (ENSURE ENLIVE) LIQD Take 237 mLs by mouth 2 (two) times daily between meals. 237 mL 12  . Fluticasone-Salmeterol (ADVAIR DISKUS) 100-50 MCG/DOSE AEPB Inhale 1 puff into the lungs 2 (two) times daily. 60 each 12  . furosemide (LASIX) 20 MG tablet Take 2 tablets (40 mg total) by mouth daily. 30 tablet   . guaiFENesin (MUCINEX) 600 MG 12 hr tablet Take 600 mg by mouth daily.     . Magnesium Oxide 400 (240 Mg) MG TABS Take 400 mg by mouth daily. 30 tablet   . ondansetron (ZOFRAN) 4 MG tablet Take 1 tablet (4 mg total) by mouth every 6 (six) hours as needed for nausea. 60 tablet 4  . potassium chloride (KLOR-CON) 20 MEQ packet Take 20 mEq by mouth 2 (two) times daily.    . predniSONE (DELTASONE) 20 MG tablet Take 1 tablet (20 mg total) by mouth daily with breakfast. 4 tablet 0  . traMADol (ULTRAM) 50 MG tablet Take 1 tablet (50 mg total) by mouth every 6 (six) hours as needed. Takes 2 tablets 4 times daily as needed for pain. 20 tablet 0   No current facility-administered medications for this visit.      Allergies:   Other and Codeine   Social History:  The patient  reports that he quit smoking about 24 years ago. His smoking use included Cigars. He has a 90.00 pack-year smoking history. He has never used smokeless tobacco.  He reports that he does not drink alcohol or use drugs.   Family History:   family history includes Colon cancer in his father; Heart disease in his mother; Heart failure in his brother.    Review of Systems: Review of Systems  Constitutional: Positive for malaise/fatigue and weight loss.  Respiratory: Positive for cough.        Chest congestion  Cardiovascular: Negative.   Gastrointestinal: Negative.   Musculoskeletal: Negative.   Neurological: Positive for weakness.  Psychiatric/Behavioral: Negative.   All other systems reviewed and are negative.    PHYSICAL EXAM: VS:  BP (!) 104/58 (BP Location: Left Arm, Patient Position: Sitting, Cuff Size: Normal)   Pulse 95   Ht '5\' 7"'$  (1.702 m)   Wt 154 lb (69.9 kg)   SpO2 98%   BMI 24.12 kg/m  , BMI Body mass index is 24.12 kg/m. GEN: Well nourished, well developed, in no acute distress  HEENT: normal  Neck: no JVD, carotid bruits, or  masses Cardiac: RRR; no murmurs, rubs, or gallops,no edema  Respiratory:  clear to auscultation bilaterally, normal work of breathing GI: soft, nontender, nondistended, + BS MS: no deformity or atrophy  Skin: warm and dry, no rash Neuro:  Strength and sensation are intact Psych: euthymic mood, full affect    Recent Labs: 07/20/2016: B Natriuretic Peptide 42.0 07/25/2016: TSH 0.650 07/29/2016: Magnesium 1.6 08/12/2016: ALT 30; BUN 9; Creatinine, Ser 0.86; Potassium 3.5; Sodium 134 08/18/2016: Hemoglobin 9.1; Platelets 359    Lipid Panel No results found for: CHOL, HDL, LDLCALC, TRIG    Wt Readings from Last 3 Encounters:  08/31/16 154 lb (69.9 kg)  08/18/16 149 lb (67.6 kg)  07/22/16 150 lb 9.2 oz (68.3 kg)       ASSESSMENT AND PLAN:  COPD exacerbation (HCC) Close follow-up with pulmonary. Of concern is recurrence of his cough, congestion in the past several days  Mycobacterium avium complex ([place]) Scheduled to see Dr. Ola Spurr tomorrow for further discussion on restarting  antibiotics He is weak, likely from recent hospital admissions, continued anemia  Coronary artery disease involving native heart without angina pectoris, unspecified vessel or lesion type - Plan: EKG 12-Lead Currently with no symptoms of angina. No further workup at this time. Continue current medication regimen.  Other secondary hypertension - Plan: EKG 12-Lead Blood pressure running low, likely secondary to recent weight loss  Mixed hyperlipidemia Currently on Lipitor  Other iron deficiency anemia - Plan: Ambulatory referral to Hematology / Oncology Recommended he discuss this with Dr. Ola Spurr tomorrow Referral has been placed for consultation with hematology. Suspect secondary to poor nutrition, low iron. Treated with 1 iron infusion while in the hospital    Total encounter time more than 25 minutes  Greater than 50% was spent in counseling and coordination of care with the patient   Disposition:   F/U  6 months   Orders Placed This Encounter  Procedures  . Ambulatory referral to Hematology / Oncology  . EKG 12-Lead     Signed, Esmond Plants, M.D., Ph.D. 08/31/2016  [place], [place]

## 2016-08-31 NOTE — Patient Instructions (Addendum)
Medication Instructions:   Take lasix 20 daily with potassium Touch of salt/sodium  Take extra potassium for episodes of diarrhea  Cut off drinks after 7 pm Try to drink less if taking less lasix  Labwork:  No new labs needed  Testing/Procedures:  No further testing at this time   We will make appt with Dr. Grayland Ormond or hematology for anemia December 19 @ 3:00pm [facility] is mailing you a new patient packet w/ your appt info   I recommend watching educational videos on topics of interest to you at:       [email or web address].[email or web address]  Enter code: HEARTCARE    Follow-Up: It was a pleasure seeing you in the office today. Please call us if you have new issues that need to be addressed before your next appt.  [phone]  Your physician wants you to follow-up in: 6 months.  You will receive a reminder letter in the mail two months in advance. If you don't receive a letter, please call our office to schedule the follow-up appointment.  If you need a refill on your cardiac medications before your next appointment, please call your pharmacy.

## 2016-09-01 DIAGNOSIS — I5033 Acute on chronic diastolic (congestive) heart failure: Secondary | ICD-10-CM | POA: Diagnosis not present

## 2016-09-01 DIAGNOSIS — J439 Emphysema, unspecified: Secondary | ICD-10-CM | POA: Diagnosis not present

## 2016-09-01 DIAGNOSIS — J45909 Unspecified asthma, uncomplicated: Secondary | ICD-10-CM | POA: Diagnosis not present

## 2016-09-01 DIAGNOSIS — I110 Hypertensive heart disease with heart failure: Secondary | ICD-10-CM | POA: Diagnosis not present

## 2016-09-01 DIAGNOSIS — C3492 Malignant neoplasm of unspecified part of left bronchus or lung: Secondary | ICD-10-CM | POA: Diagnosis not present

## 2016-09-01 DIAGNOSIS — A310 Pulmonary mycobacterial infection: Secondary | ICD-10-CM | POA: Diagnosis not present

## 2016-09-02 ENCOUNTER — Ambulatory Visit (INDEPENDENT_AMBULATORY_CARE_PROVIDER_SITE_OTHER): Payer: Medicare Other | Admitting: Pulmonary Disease

## 2016-09-02 ENCOUNTER — Ambulatory Visit: Payer: Medicare Other | Admitting: Infectious Disease

## 2016-09-02 ENCOUNTER — Encounter: Payer: Self-pay | Admitting: Pulmonary Disease

## 2016-09-02 VITALS — BP 120/62 | HR 84 | Ht 67.0 in | Wt 154.0 lb

## 2016-09-02 DIAGNOSIS — A310 Pulmonary mycobacterial infection: Secondary | ICD-10-CM

## 2016-09-02 DIAGNOSIS — J471 Bronchiectasis with (acute) exacerbation: Secondary | ICD-10-CM

## 2016-09-02 DIAGNOSIS — J439 Emphysema, unspecified: Secondary | ICD-10-CM | POA: Diagnosis not present

## 2016-09-02 DIAGNOSIS — I110 Hypertensive heart disease with heart failure: Secondary | ICD-10-CM | POA: Diagnosis not present

## 2016-09-02 DIAGNOSIS — J449 Chronic obstructive pulmonary disease, unspecified: Secondary | ICD-10-CM

## 2016-09-02 DIAGNOSIS — I25111 Atherosclerotic heart disease of native coronary artery with angina pectoris with documented spasm: Secondary | ICD-10-CM

## 2016-09-02 DIAGNOSIS — R0609 Other forms of dyspnea: Secondary | ICD-10-CM

## 2016-09-02 DIAGNOSIS — J45909 Unspecified asthma, uncomplicated: Secondary | ICD-10-CM | POA: Diagnosis not present

## 2016-09-02 DIAGNOSIS — I5033 Acute on chronic diastolic (congestive) heart failure: Secondary | ICD-10-CM | POA: Diagnosis not present

## 2016-09-02 DIAGNOSIS — C3492 Malignant neoplasm of unspecified part of left bronchus or lung: Secondary | ICD-10-CM | POA: Diagnosis not present

## 2016-09-02 MED ORDER — FLUTICASONE-UMECLIDIN-VILANT 100-62.5-25 MCG/INH IN AEPB: 1.0000 | INHALATION_SPRAY | Freq: Every day | RESPIRATORY_TRACT | 10 refills | Status: DC

## 2016-09-02 MED ORDER — FLUTICASONE-UMECLIDIN-VILANT 100-62.5-25 MCG/INH IN AEPB: 1.0000 | INHALATION_SPRAY | Freq: Every day | RESPIRATORY_TRACT | 0 refills | Status: AC

## 2016-09-02 MED ORDER — LEVOFLOXACIN 500 MG PO TABS: 500.0000 mg | ORAL_TABLET | Freq: Every day | ORAL | 0 refills | Status: AC

## 2016-09-02 NOTE — Patient Instructions (Signed)
1) Change Advair inhaler to Trelegy inhaler - one inhalation once a day 2) Levofloxacin 500 mg - once a day for 10 days 3) Follow up with me in 4-5 weeks

## 2016-09-02 NOTE — Progress Notes (Signed)
Patient ID: [name], male   DOB: [birth year], 70 y.o.   MRN: [id number] Patient seen in the office today and instructed on use of TRELEGY ELLIPTA.  Patient expressed understanding and demonstrated technique.

## 2016-09-03 DIAGNOSIS — I5033 Acute on chronic diastolic (congestive) heart failure: Secondary | ICD-10-CM | POA: Diagnosis not present

## 2016-09-03 DIAGNOSIS — I110 Hypertensive heart disease with heart failure: Secondary | ICD-10-CM | POA: Diagnosis not present

## 2016-09-03 DIAGNOSIS — J439 Emphysema, unspecified: Secondary | ICD-10-CM | POA: Diagnosis not present

## 2016-09-03 DIAGNOSIS — A310 Pulmonary mycobacterial infection: Secondary | ICD-10-CM | POA: Diagnosis not present

## 2016-09-03 DIAGNOSIS — C3492 Malignant neoplasm of unspecified part of left bronchus or lung: Secondary | ICD-10-CM | POA: Diagnosis not present

## 2016-09-03 DIAGNOSIS — J45909 Unspecified asthma, uncomplicated: Secondary | ICD-10-CM | POA: Diagnosis not present

## 2016-09-03 NOTE — Progress Notes (Signed)
PULMONARY OFFICE FOLLOW UP  PROBLEMS: 1) Severe COPD 2) Pulmonary MAC - he has been intolerant to attempts @ Rx 3) Bronchiectasis 4) H/O lung cancer - S/P LLL resection 2000 5) Former smoker - quit 1995  INTERVAL HISTORY: Hospitalized end of Oct for acute on chronic hypoxemic respiratory failure. He was discharged to SNF rehab then [facility] to home 08/19/16  SUBJ: He nearly recovered to his baseline by the end of his rehab stay. Now indicates that he is getting a "cold" with cough, chest congestion, yellow mucus. Not much worsening in his dyspnea presently (class III/IV at baseline). Denies fever, hemoptysis and LE edema or calf tenderness.  OBJ: Vitals:   09/02/16 1017  BP: 120/62  Pulse: 84  SpO2: 95%  Weight: 154 lb (69.9 kg)  Height: '5\' 7"'$  (1.702 m)   Frail, no overt distress HEENT WNL   CURRENT PULMONARY THERAPIES Advair PRN albuterol Chest percussion  DATA: Recent CXRs and CT chest reveiewed  IMPRESSION: 1) Chronic hypoxic respiratory failure 2) Severe COPD 3) S/P LLL resection for lung cancer 2000 4) Pulmonary MAC 5) Severe bronchiectasis with acute exacerbation (increased cough and purulent sputum)  PLAN: 1) Change Advair to Trelegy (ICS, LABA, LAMA combination) 2) Levofloxacin 500 mg daily X 10 days 3) ROV 4-5 weeks   Merton Border, MD PCCM service Mobile [phone] Pager [phone] 09/03/2016

## 2016-09-04 DIAGNOSIS — J439 Emphysema, unspecified: Secondary | ICD-10-CM | POA: Diagnosis not present

## 2016-09-04 DIAGNOSIS — J45909 Unspecified asthma, uncomplicated: Secondary | ICD-10-CM | POA: Diagnosis not present

## 2016-09-04 DIAGNOSIS — I5033 Acute on chronic diastolic (congestive) heart failure: Secondary | ICD-10-CM | POA: Diagnosis not present

## 2016-09-04 DIAGNOSIS — C3492 Malignant neoplasm of unspecified part of left bronchus or lung: Secondary | ICD-10-CM | POA: Diagnosis not present

## 2016-09-04 DIAGNOSIS — A310 Pulmonary mycobacterial infection: Secondary | ICD-10-CM | POA: Diagnosis not present

## 2016-09-04 DIAGNOSIS — I110 Hypertensive heart disease with heart failure: Secondary | ICD-10-CM | POA: Diagnosis not present

## 2016-09-07 ENCOUNTER — Ambulatory Visit: Payer: Medicare Other | Admitting: Internal Medicine

## 2016-09-07 DIAGNOSIS — A310 Pulmonary mycobacterial infection: Secondary | ICD-10-CM | POA: Diagnosis not present

## 2016-09-07 DIAGNOSIS — J439 Emphysema, unspecified: Secondary | ICD-10-CM | POA: Diagnosis not present

## 2016-09-07 DIAGNOSIS — I5033 Acute on chronic diastolic (congestive) heart failure: Secondary | ICD-10-CM | POA: Diagnosis not present

## 2016-09-07 DIAGNOSIS — I110 Hypertensive heart disease with heart failure: Secondary | ICD-10-CM | POA: Diagnosis not present

## 2016-09-07 DIAGNOSIS — J45909 Unspecified asthma, uncomplicated: Secondary | ICD-10-CM | POA: Diagnosis not present

## 2016-09-07 DIAGNOSIS — C3492 Malignant neoplasm of unspecified part of left bronchus or lung: Secondary | ICD-10-CM | POA: Diagnosis not present

## 2016-09-08 DIAGNOSIS — A310 Pulmonary mycobacterial infection: Secondary | ICD-10-CM | POA: Diagnosis not present

## 2016-09-08 DIAGNOSIS — I110 Hypertensive heart disease with heart failure: Secondary | ICD-10-CM | POA: Diagnosis not present

## 2016-09-08 DIAGNOSIS — C3492 Malignant neoplasm of unspecified part of left bronchus or lung: Secondary | ICD-10-CM | POA: Diagnosis not present

## 2016-09-08 DIAGNOSIS — J439 Emphysema, unspecified: Secondary | ICD-10-CM | POA: Diagnosis not present

## 2016-09-08 DIAGNOSIS — J45909 Unspecified asthma, uncomplicated: Secondary | ICD-10-CM | POA: Diagnosis not present

## 2016-09-08 DIAGNOSIS — I5033 Acute on chronic diastolic (congestive) heart failure: Secondary | ICD-10-CM | POA: Diagnosis not present

## 2016-09-09 DIAGNOSIS — I110 Hypertensive heart disease with heart failure: Secondary | ICD-10-CM | POA: Diagnosis not present

## 2016-09-09 DIAGNOSIS — J45909 Unspecified asthma, uncomplicated: Secondary | ICD-10-CM | POA: Diagnosis not present

## 2016-09-09 DIAGNOSIS — A310 Pulmonary mycobacterial infection: Secondary | ICD-10-CM | POA: Diagnosis not present

## 2016-09-09 DIAGNOSIS — C3492 Malignant neoplasm of unspecified part of left bronchus or lung: Secondary | ICD-10-CM | POA: Diagnosis not present

## 2016-09-09 DIAGNOSIS — J439 Emphysema, unspecified: Secondary | ICD-10-CM | POA: Diagnosis not present

## 2016-09-09 DIAGNOSIS — I5033 Acute on chronic diastolic (congestive) heart failure: Secondary | ICD-10-CM | POA: Diagnosis not present

## 2016-09-10 DIAGNOSIS — I5033 Acute on chronic diastolic (congestive) heart failure: Secondary | ICD-10-CM | POA: Diagnosis not present

## 2016-09-10 DIAGNOSIS — J439 Emphysema, unspecified: Secondary | ICD-10-CM | POA: Diagnosis not present

## 2016-09-10 DIAGNOSIS — A310 Pulmonary mycobacterial infection: Secondary | ICD-10-CM | POA: Diagnosis not present

## 2016-09-10 DIAGNOSIS — I110 Hypertensive heart disease with heart failure: Secondary | ICD-10-CM | POA: Diagnosis not present

## 2016-09-10 DIAGNOSIS — C3492 Malignant neoplasm of unspecified part of left bronchus or lung: Secondary | ICD-10-CM | POA: Diagnosis not present

## 2016-09-10 DIAGNOSIS — J45909 Unspecified asthma, uncomplicated: Secondary | ICD-10-CM | POA: Diagnosis not present

## 2016-09-11 DIAGNOSIS — J45909 Unspecified asthma, uncomplicated: Secondary | ICD-10-CM | POA: Diagnosis not present

## 2016-09-11 DIAGNOSIS — J439 Emphysema, unspecified: Secondary | ICD-10-CM | POA: Diagnosis not present

## 2016-09-11 DIAGNOSIS — C3492 Malignant neoplasm of unspecified part of left bronchus or lung: Secondary | ICD-10-CM | POA: Diagnosis not present

## 2016-09-11 DIAGNOSIS — I110 Hypertensive heart disease with heart failure: Secondary | ICD-10-CM | POA: Diagnosis not present

## 2016-09-11 DIAGNOSIS — I5033 Acute on chronic diastolic (congestive) heart failure: Secondary | ICD-10-CM | POA: Diagnosis not present

## 2016-09-11 DIAGNOSIS — A310 Pulmonary mycobacterial infection: Secondary | ICD-10-CM | POA: Diagnosis not present

## 2016-09-14 DIAGNOSIS — J439 Emphysema, unspecified: Secondary | ICD-10-CM | POA: Diagnosis not present

## 2016-09-14 DIAGNOSIS — I5033 Acute on chronic diastolic (congestive) heart failure: Secondary | ICD-10-CM | POA: Diagnosis not present

## 2016-09-14 DIAGNOSIS — A310 Pulmonary mycobacterial infection: Secondary | ICD-10-CM | POA: Diagnosis not present

## 2016-09-14 DIAGNOSIS — I110 Hypertensive heart disease with heart failure: Secondary | ICD-10-CM | POA: Diagnosis not present

## 2016-09-14 DIAGNOSIS — J45909 Unspecified asthma, uncomplicated: Secondary | ICD-10-CM | POA: Diagnosis not present

## 2016-09-14 DIAGNOSIS — C3492 Malignant neoplasm of unspecified part of left bronchus or lung: Secondary | ICD-10-CM | POA: Diagnosis not present

## 2016-09-14 NOTE — Progress Notes (Signed)
[place]  Telephone:(336) [phone] Fax:(336) [phone]  ID: [name] OB: September 06, 1946  MR#: [id number]  SVX#:[id number]  Patient Care Team: Asencion Noble, MD as PCP - General (Internal Medicine) Minna Merritts, MD (Cardiology)  CHIEF COMPLAINT:  Iron deficiency anemia.  INTERVAL HISTORY: Patient is a 70 year old male with multiple medical problems who has had several recent hospital admissions secondary to increased shortness of breath, weight loss, and severe diarrhea. Patient also was found to be severely anemic requiring several units of packed red blood cells. Currently, he feels improved but not back to his baseline. He has no neurologic complaints. He denies any current fevers. He continues to be persistently weakness and fatigue. He continues to have persistent shortness of breath. His appetite has improved and he denies any nausea, vomiting, constipation, or diarrhea. He has no urinary complaints. Patient otherwise feels well and offers no further specific complaints.  REVIEW OF SYSTEMS:   Review of Systems  Constitutional: Positive for malaise/fatigue and weight loss. Negative for fever.  Respiratory: Positive for shortness of breath. Negative for cough and hemoptysis.   Cardiovascular: Negative.  Negative for chest pain and leg swelling.  Gastrointestinal: Negative.  Negative for abdominal pain and diarrhea.  Genitourinary: Negative.   Neurological: Positive for weakness.  Psychiatric/Behavioral: Negative.  The patient is not nervous/anxious.     As per HPI. Otherwise, a complete review of systems is negative.  PAST MEDICAL HISTORY: Past Medical History:  Diagnosis Date  . Acute MI    pt denies  . Arthritis of lumbar spine ([place])   . Asthma   . Clotting disorder ([place])   . Collapsed lung    x3  . COPD (chronic obstructive pulmonary disease) ([place])   . Dyspnea   . History of benign thyroid tumor   . Lung cancer ([place])   . Mycobacterium avium complex  ([place]) 06/24/2016  . Pneumonia   . Pneumothorax   . Small cell carcinoma of lung ([place])   . Spontaneous pneumothorax     PAST SURGICAL HISTORY: Past Surgical History:  Procedure Laterality Date  . APPENDECTOMY    . benign thryoid nodule resected    . CARDIAC CATHETERIZATION  2013   [facility]  . CHEST TUBE INSERTION    . COLONOSCOPY N/A 08/30/2015   Procedure: COLONOSCOPY;  Surgeon: Rogene Houston, MD;  [facility];  Service: Endoscopy;  Laterality: N/A;  1:45  . left upper lobectomy for small cell lung cancer    . VATS R thoracotomy      FAMILY HISTORY: Family History  Problem Relation Age of Onset  . Heart disease Mother   . Colon cancer Father   . Heart failure Brother     ADVANCED DIRECTIVES (Y/N):  N  HEALTH MAINTENANCE: Social History  Substance Use Topics  . Smoking status: Former Smoker    Packs/day: 3.00    Years: 30.00    Types: Cigars    Quit date: 03/02/1992  . Smokeless tobacco: Never Used     Comment: quit 22 yrs ago  . Alcohol use No     Colonoscopy:  PAP:  Bone density:  Lipid panel:  Allergies  Allergen Reactions  . Other Other (See Comments)    Hydromet cough syrup-causes GI upset  . Codeine Nausea And Vomiting    Current Outpatient Prescriptions  Medication Sig Dispense Refill  . acetaminophen (TYLENOL) 325 MG tablet Take 650 mg by mouth as needed.      Marland Kitchen albuterol (PROVENTIL  HFA;VENTOLIN HFA) 108 (90 Base) MCG/ACT inhaler Inhale 2 puffs into the lungs every 6 (six) hours as needed for wheezing or shortness of breath. 1 Inhaler 12  . aspirin 81 MG EC tablet Take 81 mg by mouth daily. Swallow whole.    Marland Kitchen atorvastatin (LIPITOR) 10 MG tablet TAKE 1 TABLET BY MOUTH EVERY DAY 90 tablet 3  . feeding supplement, ENSURE ENLIVE, (ENSURE ENLIVE) LIQD Take 237 mLs by mouth 2 (two) times daily between meals. 237 mL 12  . Fluticasone-Salmeterol (ADVAIR DISKUS) 100-50 MCG/DOSE AEPB Inhale 1 puff into the lungs 2 (two) times daily. 60 each 12  .  furosemide (LASIX) 20 MG tablet Take 2 tablets (40 mg total) by mouth daily. 30 tablet   . guaiFENesin (MUCINEX) 600 MG 12 hr tablet Take 600 mg by mouth daily.     . Magnesium Oxide 400 (240 Mg) MG TABS Take 400 mg by mouth daily. 30 tablet   . ondansetron (ZOFRAN) 4 MG tablet Take 1 tablet (4 mg total) by mouth every 6 (six) hours as needed for nausea. 60 tablet 4  . potassium chloride (KLOR-CON) 20 MEQ packet Take 20 mEq by mouth 2 (two) times daily.    . traMADol (ULTRAM) 50 MG tablet Take 1 tablet (50 mg total) by mouth every 6 (six) hours as needed. Takes 2 tablets 4 times daily as needed for pain. 20 tablet 0  . Fluticasone-Umeclidin-Vilant (TRELEGY ELLIPTA) 100-62.5-25 MCG/INH AEPB Inhale 1 puff into the lungs daily. (Patient not taking: Reported on 09/15/2016) 60 each 10  . potassium chloride SA (K-DUR,KLOR-CON) 20 MEQ tablet      No current facility-administered medications for this visit.     OBJECTIVE: Vitals:   09/15/16 1503  BP: (!) 158/74  Pulse: 97  Resp: 18  Temp: 99.3 F (37.4 C)     Body mass index is 23.95 kg/m.    ECOG FS:1 - Symptomatic but completely ambulatory  General: Well-developed, well-nourished, no acute distress. Eyes: Pink conjunctiva, anicteric sclera. HEENT: Normocephalic, moist mucous membranes, clear oropharnyx. Lungs: Clear to auscultation bilaterally. Heart: Regular rate and rhythm. No rubs, murmurs, or gallops. Abdomen: Soft, nontender, nondistended. No organomegaly noted, normoactive bowel sounds. Musculoskeletal: No edema, cyanosis, or clubbing. Neuro: Alert, answering all questions appropriately. Cranial nerves grossly intact. Skin: No rashes or petechiae noted. Psych: Normal affect. Lymphatics: No cervical, calvicular, axillary or inguinal LAD.   LAB RESULTS:  Lab Results  Component Value Date   NA 134 (L) 08/12/2016   K 3.5 08/12/2016   CL 97 (L) 08/12/2016   CO2 25 08/12/2016   GLUCOSE 92 08/12/2016   BUN 9 08/12/2016    CREATININE 0.86 08/12/2016   CALCIUM 8.9 08/12/2016   PROT 6.1 (L) 08/12/2016   ALBUMIN 3.0 (L) 08/12/2016   AST 23 08/12/2016   ALT 30 08/12/2016   ALKPHOS 51 08/12/2016   BILITOT 0.9 08/12/2016   GFRNONAA >60 08/12/2016   GFRAA >60 08/12/2016    Lab Results  Component Value Date   WBC 14.3 (H) 09/15/2016   NEUTROABS 7.1 (H) 08/18/2016   HGB 8.3 (L) 09/15/2016   HCT 25.2 (L) 09/15/2016   MCV 80.7 09/15/2016   PLT 574 (H) 09/15/2016   Lab Results  Component Value Date   IRON 15 (L) 09/15/2016   TIBC 316 09/15/2016   IRONPCTSAT 5 (L) 09/15/2016   Lab Results  Component Value Date   FERRITIN 91 09/15/2016     STUDIES: No results found.  ASSESSMENT: Iron deficiency anemia.  PLAN:    1.  Iron deficiency anemia: Patient continues to have a persistently decreased hemoglobin and iron stores despite blood transfusion for 6 weeks ago in the hospital. The remainder of his anemia workup is either negative or within normal limits. Patient will return to clinic in 1 and 2 weeks to receive 510 mg IV Feraheme. He will then return to clinic in 2 months with repeat laboratory work and further evaluation. 2. Shortness of breath/MAC: Continue treatment and evaluation for pulmonary and infectious disease. 3. Cardiac disease: Continue follow-up and evaluation with cardiology as needed.  Patient expressed understanding and was in agreement with this plan. He also understands that He can call clinic at any time with any questions, concerns, or complaints.    Lloyd Huger, MD   09/19/2016 8:36 AM

## 2016-09-15 ENCOUNTER — Inpatient Hospital Stay: Payer: Medicare Other | Attending: Oncology | Admitting: Oncology

## 2016-09-15 ENCOUNTER — Encounter: Payer: Self-pay | Admitting: Oncology

## 2016-09-15 ENCOUNTER — Inpatient Hospital Stay: Payer: Medicare Other

## 2016-09-15 VITALS — BP 158/74 | HR 97 | Temp 99.3°F | Resp 18 | Ht 67.0 in | Wt 152.9 lb

## 2016-09-15 DIAGNOSIS — Z87891 Personal history of nicotine dependence: Secondary | ICD-10-CM | POA: Diagnosis not present

## 2016-09-15 DIAGNOSIS — I519 Heart disease, unspecified: Secondary | ICD-10-CM | POA: Diagnosis not present

## 2016-09-15 DIAGNOSIS — M549 Dorsalgia, unspecified: Secondary | ICD-10-CM | POA: Diagnosis not present

## 2016-09-15 DIAGNOSIS — D509 Iron deficiency anemia, unspecified: Secondary | ICD-10-CM

## 2016-09-15 DIAGNOSIS — Z7982 Long term (current) use of aspirin: Secondary | ICD-10-CM | POA: Insufficient documentation

## 2016-09-15 DIAGNOSIS — Z8701 Personal history of pneumonia (recurrent): Secondary | ICD-10-CM | POA: Insufficient documentation

## 2016-09-15 DIAGNOSIS — J449 Chronic obstructive pulmonary disease, unspecified: Secondary | ICD-10-CM | POA: Insufficient documentation

## 2016-09-15 DIAGNOSIS — D500 Iron deficiency anemia secondary to blood loss (chronic): Secondary | ICD-10-CM

## 2016-09-15 DIAGNOSIS — R0602 Shortness of breath: Secondary | ICD-10-CM | POA: Diagnosis not present

## 2016-09-15 DIAGNOSIS — Z801 Family history of malignant neoplasm of trachea, bronchus and lung: Secondary | ICD-10-CM | POA: Insufficient documentation

## 2016-09-15 DIAGNOSIS — I252 Old myocardial infarction: Secondary | ICD-10-CM | POA: Diagnosis not present

## 2016-09-15 DIAGNOSIS — J45909 Unspecified asthma, uncomplicated: Secondary | ICD-10-CM | POA: Insufficient documentation

## 2016-09-15 DIAGNOSIS — Z79899 Other long term (current) drug therapy: Secondary | ICD-10-CM | POA: Diagnosis not present

## 2016-09-15 LAB — DAT, POLYSPECIFIC AHG (ARMC ONLY): Polyspecific AHG test: NEGATIVE

## 2016-09-15 LAB — CBC
HCT: 25.2 % — ABNORMAL LOW (ref 40.0–52.0)
Hemoglobin: 8.3 g/dL — ABNORMAL LOW (ref 13.0–18.0)
MCH: 26.4 pg (ref 26.0–34.0)
MCHC: 32.8 g/dL (ref 32.0–36.0)
MCV: 80.7 fL (ref 80.0–100.0)
PLATELETS: 574 10*3/uL — AB (ref 150–440)
RBC: 3.13 MIL/uL — AB (ref 4.40–5.90)
RDW: 17.5 % — ABNORMAL HIGH (ref 11.5–14.5)
WBC: 14.3 10*3/uL — AB (ref 3.8–10.6)

## 2016-09-15 LAB — FERRITIN: FERRITIN: 91 ng/mL (ref 24–336)

## 2016-09-15 LAB — IRON AND TIBC
IRON: 15 ug/dL — AB (ref 45–182)
Saturation Ratios: 5 % — ABNORMAL LOW (ref 17.9–39.5)
TIBC: 316 ug/dL (ref 250–450)
UIBC: 301 ug/dL

## 2016-09-15 LAB — FOLATE: FOLATE: 14.5 ng/mL (ref >5.9)

## 2016-09-15 LAB — VITAMIN B12: VITAMIN B 12: 808 pg/mL (ref 180–914)

## 2016-09-15 LAB — LACTATE DEHYDROGENASE: LDH: 140 U/L (ref 98–192)

## 2016-09-15 NOTE — Progress Notes (Signed)
Here for initial eval. In pt oct 1-3 rd- sob   , re admitted oct 23- nov 1 for sob,nausea,wt loss,diff eating. Severe diarrhea. Required blood transfusion -in [facility] x 5 d.  D/c 11/1-11/21 in rehab.  Final diagnosis was related to ABX treatment for mycobacterium avium complex ( MAC )

## 2016-09-16 DIAGNOSIS — J45909 Unspecified asthma, uncomplicated: Secondary | ICD-10-CM | POA: Diagnosis not present

## 2016-09-16 DIAGNOSIS — A310 Pulmonary mycobacterial infection: Secondary | ICD-10-CM | POA: Diagnosis not present

## 2016-09-16 DIAGNOSIS — I110 Hypertensive heart disease with heart failure: Secondary | ICD-10-CM | POA: Diagnosis not present

## 2016-09-16 DIAGNOSIS — J439 Emphysema, unspecified: Secondary | ICD-10-CM | POA: Diagnosis not present

## 2016-09-16 DIAGNOSIS — C3492 Malignant neoplasm of unspecified part of left bronchus or lung: Secondary | ICD-10-CM | POA: Diagnosis not present

## 2016-09-16 DIAGNOSIS — I5033 Acute on chronic diastolic (congestive) heart failure: Secondary | ICD-10-CM | POA: Diagnosis not present

## 2016-09-16 LAB — ERYTHROPOIETIN: Erythropoietin: 32.2 m[IU]/mL — ABNORMAL HIGH (ref 2.6–18.5)

## 2016-09-17 DIAGNOSIS — I5032 Chronic diastolic (congestive) heart failure: Secondary | ICD-10-CM | POA: Diagnosis not present

## 2016-09-17 DIAGNOSIS — D509 Iron deficiency anemia, unspecified: Secondary | ICD-10-CM | POA: Diagnosis not present

## 2016-09-17 DIAGNOSIS — I10 Essential (primary) hypertension: Secondary | ICD-10-CM | POA: Diagnosis not present

## 2016-09-17 DIAGNOSIS — J449 Chronic obstructive pulmonary disease, unspecified: Secondary | ICD-10-CM | POA: Diagnosis not present

## 2016-09-17 LAB — PROTEIN ELECTROPHORESIS, SERUM
A/G RATIO SPE: 1 (ref 0.7–1.7)
ALPHA-1-GLOBULIN: 0.4 g/dL (ref 0.0–0.4)
ALPHA-2-GLOBULIN: 0.9 g/dL (ref 0.4–1.0)
Albumin ELP: 3.1 g/dL (ref 2.9–4.4)
BETA GLOBULIN: 1 g/dL (ref 0.7–1.3)
GLOBULIN, TOTAL: 3.2 g/dL (ref 2.2–3.9)
Gamma Globulin: 0.8 g/dL (ref 0.4–1.8)
Total Protein ELP: 6.3 g/dL (ref 6.0–8.5)

## 2016-09-18 ENCOUNTER — Inpatient Hospital Stay: Payer: Medicare Other

## 2016-09-18 VITALS — BP 133/68 | HR 92 | Temp 96.5°F | Resp 18

## 2016-09-18 DIAGNOSIS — A310 Pulmonary mycobacterial infection: Secondary | ICD-10-CM | POA: Diagnosis not present

## 2016-09-18 DIAGNOSIS — R197 Diarrhea, unspecified: Secondary | ICD-10-CM | POA: Diagnosis not present

## 2016-09-18 DIAGNOSIS — D509 Iron deficiency anemia, unspecified: Secondary | ICD-10-CM | POA: Diagnosis not present

## 2016-09-18 DIAGNOSIS — M549 Dorsalgia, unspecified: Secondary | ICD-10-CM | POA: Diagnosis not present

## 2016-09-18 DIAGNOSIS — C3492 Malignant neoplasm of unspecified part of left bronchus or lung: Secondary | ICD-10-CM | POA: Diagnosis not present

## 2016-09-18 DIAGNOSIS — I5033 Acute on chronic diastolic (congestive) heart failure: Secondary | ICD-10-CM | POA: Diagnosis not present

## 2016-09-18 DIAGNOSIS — J45909 Unspecified asthma, uncomplicated: Secondary | ICD-10-CM | POA: Diagnosis not present

## 2016-09-18 DIAGNOSIS — I110 Hypertensive heart disease with heart failure: Secondary | ICD-10-CM | POA: Diagnosis not present

## 2016-09-18 DIAGNOSIS — Z79899 Other long term (current) drug therapy: Secondary | ICD-10-CM | POA: Diagnosis not present

## 2016-09-18 DIAGNOSIS — R0602 Shortness of breath: Secondary | ICD-10-CM | POA: Diagnosis not present

## 2016-09-18 DIAGNOSIS — I519 Heart disease, unspecified: Secondary | ICD-10-CM | POA: Diagnosis not present

## 2016-09-18 DIAGNOSIS — D500 Iron deficiency anemia secondary to blood loss (chronic): Secondary | ICD-10-CM

## 2016-09-18 DIAGNOSIS — J439 Emphysema, unspecified: Secondary | ICD-10-CM | POA: Diagnosis not present

## 2016-09-18 DIAGNOSIS — I252 Old myocardial infarction: Secondary | ICD-10-CM | POA: Diagnosis not present

## 2016-09-18 MED ORDER — SODIUM CHLORIDE 0.9 % IV SOLN
510.0000 mg | Freq: Once | INTRAVENOUS | Status: AC
  Administered 2016-09-18: 510 mg via INTRAVENOUS
  Filled 2016-09-18: qty 17

## 2016-09-18 MED ORDER — SODIUM CHLORIDE 0.9 % IV SOLN
Freq: Once | INTRAVENOUS | Status: AC
  Administered 2016-09-18: 15:00:00 via INTRAVENOUS
  Filled 2016-09-18: qty 1000

## 2016-09-19 LAB — HAPTOGLOBIN: Haptoglobin: 305 mg/dL — ABNORMAL HIGH (ref 34–200)

## 2016-09-22 ENCOUNTER — Telehealth: Payer: Self-pay | Admitting: Pulmonary Disease

## 2016-09-22 DIAGNOSIS — C3492 Malignant neoplasm of unspecified part of left bronchus or lung: Secondary | ICD-10-CM | POA: Diagnosis not present

## 2016-09-22 DIAGNOSIS — J439 Emphysema, unspecified: Secondary | ICD-10-CM | POA: Diagnosis not present

## 2016-09-22 DIAGNOSIS — I5033 Acute on chronic diastolic (congestive) heart failure: Secondary | ICD-10-CM | POA: Diagnosis not present

## 2016-09-22 DIAGNOSIS — A310 Pulmonary mycobacterial infection: Secondary | ICD-10-CM | POA: Diagnosis not present

## 2016-09-22 DIAGNOSIS — I110 Hypertensive heart disease with heart failure: Secondary | ICD-10-CM | POA: Diagnosis not present

## 2016-09-22 DIAGNOSIS — J45909 Unspecified asthma, uncomplicated: Secondary | ICD-10-CM | POA: Diagnosis not present

## 2016-09-22 NOTE — Telephone Encounter (Signed)
Pt's insurance will not cover Trelegy. Called pt's insurance at [phone] and spoke with Tobi. PA can't be done over the phone. PA was started on [email or web address] >> follow prompts. Case #: [id number]. Will await PA decision.

## 2016-09-23 DIAGNOSIS — I5033 Acute on chronic diastolic (congestive) heart failure: Secondary | ICD-10-CM | POA: Diagnosis not present

## 2016-09-23 DIAGNOSIS — C3492 Malignant neoplasm of unspecified part of left bronchus or lung: Secondary | ICD-10-CM | POA: Diagnosis not present

## 2016-09-23 DIAGNOSIS — J439 Emphysema, unspecified: Secondary | ICD-10-CM | POA: Diagnosis not present

## 2016-09-23 DIAGNOSIS — J45909 Unspecified asthma, uncomplicated: Secondary | ICD-10-CM | POA: Diagnosis not present

## 2016-09-23 DIAGNOSIS — I110 Hypertensive heart disease with heart failure: Secondary | ICD-10-CM | POA: Diagnosis not present

## 2016-09-23 DIAGNOSIS — A310 Pulmonary mycobacterial infection: Secondary | ICD-10-CM | POA: Diagnosis not present

## 2016-09-25 ENCOUNTER — Inpatient Hospital Stay: Payer: Medicare Other

## 2016-09-25 DIAGNOSIS — M549 Dorsalgia, unspecified: Secondary | ICD-10-CM | POA: Diagnosis not present

## 2016-09-25 DIAGNOSIS — I252 Old myocardial infarction: Secondary | ICD-10-CM | POA: Diagnosis not present

## 2016-09-25 DIAGNOSIS — C3492 Malignant neoplasm of unspecified part of left bronchus or lung: Secondary | ICD-10-CM | POA: Diagnosis not present

## 2016-09-25 DIAGNOSIS — A310 Pulmonary mycobacterial infection: Secondary | ICD-10-CM | POA: Diagnosis not present

## 2016-09-25 DIAGNOSIS — Z79899 Other long term (current) drug therapy: Secondary | ICD-10-CM | POA: Diagnosis not present

## 2016-09-25 DIAGNOSIS — I5033 Acute on chronic diastolic (congestive) heart failure: Secondary | ICD-10-CM | POA: Diagnosis not present

## 2016-09-25 DIAGNOSIS — I519 Heart disease, unspecified: Secondary | ICD-10-CM | POA: Diagnosis not present

## 2016-09-25 DIAGNOSIS — I110 Hypertensive heart disease with heart failure: Secondary | ICD-10-CM | POA: Diagnosis not present

## 2016-09-25 DIAGNOSIS — R0602 Shortness of breath: Secondary | ICD-10-CM | POA: Diagnosis not present

## 2016-09-25 DIAGNOSIS — J439 Emphysema, unspecified: Secondary | ICD-10-CM | POA: Diagnosis not present

## 2016-09-25 DIAGNOSIS — D509 Iron deficiency anemia, unspecified: Secondary | ICD-10-CM | POA: Diagnosis not present

## 2016-09-25 DIAGNOSIS — J45909 Unspecified asthma, uncomplicated: Secondary | ICD-10-CM | POA: Diagnosis not present

## 2016-09-25 DIAGNOSIS — D500 Iron deficiency anemia secondary to blood loss (chronic): Secondary | ICD-10-CM

## 2016-09-25 MED ORDER — SODIUM CHLORIDE 0.9 % IV SOLN
Freq: Once | INTRAVENOUS | Status: AC
  Administered 2016-09-25: 14:00:00 via INTRAVENOUS
  Filled 2016-09-25: qty 1000

## 2016-09-25 MED ORDER — SODIUM CHLORIDE 0.9 % IV SOLN
510.0000 mg | Freq: Once | INTRAVENOUS | Status: AC
  Administered 2016-09-25: 510 mg via INTRAVENOUS
  Filled 2016-09-25: qty 17

## 2016-09-25 NOTE — Telephone Encounter (Signed)
Received PA request form [employer]. I have spoke with Lilyan Punt with [facility] and started PA for Trelegy over the phone.  PA ref #: [id number]  Will hold in triage until a determination is made.

## 2016-09-29 DIAGNOSIS — C3492 Malignant neoplasm of unspecified part of left bronchus or lung: Secondary | ICD-10-CM | POA: Diagnosis not present

## 2016-09-29 DIAGNOSIS — A310 Pulmonary mycobacterial infection: Secondary | ICD-10-CM | POA: Diagnosis not present

## 2016-09-29 DIAGNOSIS — I5033 Acute on chronic diastolic (congestive) heart failure: Secondary | ICD-10-CM | POA: Diagnosis not present

## 2016-09-29 DIAGNOSIS — I110 Hypertensive heart disease with heart failure: Secondary | ICD-10-CM | POA: Diagnosis not present

## 2016-09-29 DIAGNOSIS — J439 Emphysema, unspecified: Secondary | ICD-10-CM | POA: Diagnosis not present

## 2016-09-29 DIAGNOSIS — J45909 Unspecified asthma, uncomplicated: Secondary | ICD-10-CM | POA: Diagnosis not present

## 2016-09-29 NOTE — Telephone Encounter (Signed)
Checked on status of PA. It was approved on 09/25/2016. Called the pharmacy and they are aware. Nothing else needed.

## 2016-10-01 DIAGNOSIS — J439 Emphysema, unspecified: Secondary | ICD-10-CM | POA: Diagnosis not present

## 2016-10-01 DIAGNOSIS — I5033 Acute on chronic diastolic (congestive) heart failure: Secondary | ICD-10-CM | POA: Diagnosis not present

## 2016-10-01 DIAGNOSIS — A310 Pulmonary mycobacterial infection: Secondary | ICD-10-CM | POA: Diagnosis not present

## 2016-10-01 DIAGNOSIS — C3492 Malignant neoplasm of unspecified part of left bronchus or lung: Secondary | ICD-10-CM | POA: Diagnosis not present

## 2016-10-01 DIAGNOSIS — I110 Hypertensive heart disease with heart failure: Secondary | ICD-10-CM | POA: Diagnosis not present

## 2016-10-01 DIAGNOSIS — J45909 Unspecified asthma, uncomplicated: Secondary | ICD-10-CM | POA: Diagnosis not present

## 2016-10-02 ENCOUNTER — Encounter: Payer: Self-pay | Admitting: Pulmonary Disease

## 2016-10-02 ENCOUNTER — Ambulatory Visit (INDEPENDENT_AMBULATORY_CARE_PROVIDER_SITE_OTHER): Payer: Medicare Other | Admitting: Pulmonary Disease

## 2016-10-02 VITALS — BP 152/88 | HR 94 | Wt 150.0 lb

## 2016-10-02 DIAGNOSIS — J479 Bronchiectasis, uncomplicated: Secondary | ICD-10-CM

## 2016-10-02 DIAGNOSIS — A310 Pulmonary mycobacterial infection: Secondary | ICD-10-CM | POA: Diagnosis not present

## 2016-10-02 DIAGNOSIS — Z85118 Personal history of other malignant neoplasm of bronchus and lung: Secondary | ICD-10-CM | POA: Diagnosis not present

## 2016-10-02 DIAGNOSIS — J449 Chronic obstructive pulmonary disease, unspecified: Secondary | ICD-10-CM | POA: Diagnosis not present

## 2016-10-02 NOTE — Patient Instructions (Signed)
Continue current medications Follow up in 3-4 months with CXR prior to that visit

## 2016-10-06 DIAGNOSIS — J439 Emphysema, unspecified: Secondary | ICD-10-CM | POA: Diagnosis not present

## 2016-10-06 DIAGNOSIS — J45909 Unspecified asthma, uncomplicated: Secondary | ICD-10-CM | POA: Diagnosis not present

## 2016-10-06 DIAGNOSIS — A310 Pulmonary mycobacterial infection: Secondary | ICD-10-CM | POA: Diagnosis not present

## 2016-10-06 DIAGNOSIS — C3492 Malignant neoplasm of unspecified part of left bronchus or lung: Secondary | ICD-10-CM | POA: Diagnosis not present

## 2016-10-06 DIAGNOSIS — I5033 Acute on chronic diastolic (congestive) heart failure: Secondary | ICD-10-CM | POA: Diagnosis not present

## 2016-10-06 DIAGNOSIS — I110 Hypertensive heart disease with heart failure: Secondary | ICD-10-CM | POA: Diagnosis not present

## 2016-10-07 DIAGNOSIS — I5033 Acute on chronic diastolic (congestive) heart failure: Secondary | ICD-10-CM | POA: Diagnosis not present

## 2016-10-07 DIAGNOSIS — J439 Emphysema, unspecified: Secondary | ICD-10-CM | POA: Diagnosis not present

## 2016-10-07 DIAGNOSIS — C3492 Malignant neoplasm of unspecified part of left bronchus or lung: Secondary | ICD-10-CM | POA: Diagnosis not present

## 2016-10-07 DIAGNOSIS — I110 Hypertensive heart disease with heart failure: Secondary | ICD-10-CM | POA: Diagnosis not present

## 2016-10-07 DIAGNOSIS — J45909 Unspecified asthma, uncomplicated: Secondary | ICD-10-CM | POA: Diagnosis not present

## 2016-10-07 DIAGNOSIS — A310 Pulmonary mycobacterial infection: Secondary | ICD-10-CM | POA: Diagnosis not present

## 2016-10-08 DIAGNOSIS — C3492 Malignant neoplasm of unspecified part of left bronchus or lung: Secondary | ICD-10-CM | POA: Diagnosis not present

## 2016-10-08 DIAGNOSIS — I110 Hypertensive heart disease with heart failure: Secondary | ICD-10-CM | POA: Diagnosis not present

## 2016-10-08 DIAGNOSIS — A310 Pulmonary mycobacterial infection: Secondary | ICD-10-CM | POA: Diagnosis not present

## 2016-10-08 DIAGNOSIS — J45909 Unspecified asthma, uncomplicated: Secondary | ICD-10-CM | POA: Diagnosis not present

## 2016-10-08 DIAGNOSIS — I5033 Acute on chronic diastolic (congestive) heart failure: Secondary | ICD-10-CM | POA: Diagnosis not present

## 2016-10-08 DIAGNOSIS — J439 Emphysema, unspecified: Secondary | ICD-10-CM | POA: Diagnosis not present

## 2016-10-09 DIAGNOSIS — J439 Emphysema, unspecified: Secondary | ICD-10-CM | POA: Diagnosis not present

## 2016-10-09 DIAGNOSIS — C3492 Malignant neoplasm of unspecified part of left bronchus or lung: Secondary | ICD-10-CM | POA: Diagnosis not present

## 2016-10-09 DIAGNOSIS — J45909 Unspecified asthma, uncomplicated: Secondary | ICD-10-CM | POA: Diagnosis not present

## 2016-10-09 DIAGNOSIS — A310 Pulmonary mycobacterial infection: Secondary | ICD-10-CM | POA: Diagnosis not present

## 2016-10-09 DIAGNOSIS — I110 Hypertensive heart disease with heart failure: Secondary | ICD-10-CM | POA: Diagnosis not present

## 2016-10-09 DIAGNOSIS — I5033 Acute on chronic diastolic (congestive) heart failure: Secondary | ICD-10-CM | POA: Diagnosis not present

## 2016-10-10 NOTE — Progress Notes (Signed)
PULMONARY OFFICE FOLLOW UP  PROBLEMS: 1) Severe COPD 2) Pulmonary MAC - he has been intolerant to attempts @ Rx 3) Bronchiectasis 4) H/O lung cancer - S/P LLL resection 2000 5) Former smoker - quit 1995  INTERVAL HISTORY: No major events  SUBJ: No new complaints. Feels back to his baseline. He tried Trelegy but prefers Advair. He completed the Levofloxacin prescribed last visit despite diarrhea. The diarrhea is improved but persists. A C diff has been checked which was negative  OBJ: Vitals:   10/02/16 1110  BP: (!) 152/88  Pulse: 94  SpO2: 95%  Weight: 150 lb (68 kg)  RA  Frail, no overt distress HEENT WNL No JVD L basilar crackles, no wheezes Reg, no M NABS, soft No C/C/E   CURRENT PULMONARY THERAPIES Advair PRN albuterol Chest percussion  DATA: No   IMPRESSION: 1) Chronic hypoxic respiratory failure 2) Severe COPD 3) S/P LLL resection for lung cancer 2000 4) Pulmonary MAC 5) Severe bronchiectasis  6) Diarrhea - antibiotic associated. C diff negative  PLAN: 1) Cont Advair and PRN albuterol  2) Continue chest percussion 3) Suggested probiotics 4) ROV 3-4 months with CXR   Merton Border, MD PCCM service Mobile [phone] Pager [phone] 10/10/2016

## 2016-10-13 DIAGNOSIS — J45909 Unspecified asthma, uncomplicated: Secondary | ICD-10-CM | POA: Diagnosis not present

## 2016-10-13 DIAGNOSIS — J439 Emphysema, unspecified: Secondary | ICD-10-CM | POA: Diagnosis not present

## 2016-10-13 DIAGNOSIS — A310 Pulmonary mycobacterial infection: Secondary | ICD-10-CM | POA: Diagnosis not present

## 2016-10-13 DIAGNOSIS — I5033 Acute on chronic diastolic (congestive) heart failure: Secondary | ICD-10-CM | POA: Diagnosis not present

## 2016-10-13 DIAGNOSIS — C3492 Malignant neoplasm of unspecified part of left bronchus or lung: Secondary | ICD-10-CM | POA: Diagnosis not present

## 2016-10-13 DIAGNOSIS — I110 Hypertensive heart disease with heart failure: Secondary | ICD-10-CM | POA: Diagnosis not present

## 2016-10-16 DIAGNOSIS — I110 Hypertensive heart disease with heart failure: Secondary | ICD-10-CM | POA: Diagnosis not present

## 2016-10-16 DIAGNOSIS — I5033 Acute on chronic diastolic (congestive) heart failure: Secondary | ICD-10-CM | POA: Diagnosis not present

## 2016-10-16 DIAGNOSIS — J45909 Unspecified asthma, uncomplicated: Secondary | ICD-10-CM | POA: Diagnosis not present

## 2016-10-16 DIAGNOSIS — C3492 Malignant neoplasm of unspecified part of left bronchus or lung: Secondary | ICD-10-CM | POA: Diagnosis not present

## 2016-10-16 DIAGNOSIS — J439 Emphysema, unspecified: Secondary | ICD-10-CM | POA: Diagnosis not present

## 2016-10-16 DIAGNOSIS — A310 Pulmonary mycobacterial infection: Secondary | ICD-10-CM | POA: Diagnosis not present

## 2016-10-20 DIAGNOSIS — A310 Pulmonary mycobacterial infection: Secondary | ICD-10-CM | POA: Diagnosis not present

## 2016-10-20 DIAGNOSIS — J45909 Unspecified asthma, uncomplicated: Secondary | ICD-10-CM | POA: Diagnosis not present

## 2016-10-20 DIAGNOSIS — I110 Hypertensive heart disease with heart failure: Secondary | ICD-10-CM | POA: Diagnosis not present

## 2016-10-20 DIAGNOSIS — I5033 Acute on chronic diastolic (congestive) heart failure: Secondary | ICD-10-CM | POA: Diagnosis not present

## 2016-10-20 DIAGNOSIS — C3492 Malignant neoplasm of unspecified part of left bronchus or lung: Secondary | ICD-10-CM | POA: Diagnosis not present

## 2016-10-20 DIAGNOSIS — J439 Emphysema, unspecified: Secondary | ICD-10-CM | POA: Diagnosis not present

## 2016-10-21 DIAGNOSIS — I5033 Acute on chronic diastolic (congestive) heart failure: Secondary | ICD-10-CM | POA: Diagnosis not present

## 2016-10-21 DIAGNOSIS — I110 Hypertensive heart disease with heart failure: Secondary | ICD-10-CM | POA: Diagnosis not present

## 2016-10-21 DIAGNOSIS — J45909 Unspecified asthma, uncomplicated: Secondary | ICD-10-CM | POA: Diagnosis not present

## 2016-10-21 DIAGNOSIS — A310 Pulmonary mycobacterial infection: Secondary | ICD-10-CM | POA: Diagnosis not present

## 2016-10-21 DIAGNOSIS — C3492 Malignant neoplasm of unspecified part of left bronchus or lung: Secondary | ICD-10-CM | POA: Diagnosis not present

## 2016-10-21 DIAGNOSIS — J439 Emphysema, unspecified: Secondary | ICD-10-CM | POA: Diagnosis not present

## 2016-10-22 DIAGNOSIS — J45909 Unspecified asthma, uncomplicated: Secondary | ICD-10-CM | POA: Diagnosis not present

## 2016-10-22 DIAGNOSIS — J439 Emphysema, unspecified: Secondary | ICD-10-CM | POA: Diagnosis not present

## 2016-10-22 DIAGNOSIS — C3492 Malignant neoplasm of unspecified part of left bronchus or lung: Secondary | ICD-10-CM | POA: Diagnosis not present

## 2016-10-22 DIAGNOSIS — I5033 Acute on chronic diastolic (congestive) heart failure: Secondary | ICD-10-CM | POA: Diagnosis not present

## 2016-10-22 DIAGNOSIS — I110 Hypertensive heart disease with heart failure: Secondary | ICD-10-CM | POA: Diagnosis not present

## 2016-10-22 DIAGNOSIS — A310 Pulmonary mycobacterial infection: Secondary | ICD-10-CM | POA: Diagnosis not present

## 2016-10-23 DIAGNOSIS — J9610 Chronic respiratory failure, unspecified whether with hypoxia or hypercapnia: Secondary | ICD-10-CM | POA: Diagnosis not present

## 2016-10-23 DIAGNOSIS — J449 Chronic obstructive pulmonary disease, unspecified: Secondary | ICD-10-CM | POA: Diagnosis not present

## 2016-10-23 DIAGNOSIS — C3492 Malignant neoplasm of unspecified part of left bronchus or lung: Secondary | ICD-10-CM | POA: Diagnosis not present

## 2016-10-23 DIAGNOSIS — I5033 Acute on chronic diastolic (congestive) heart failure: Secondary | ICD-10-CM | POA: Diagnosis not present

## 2016-10-23 DIAGNOSIS — I110 Hypertensive heart disease with heart failure: Secondary | ICD-10-CM | POA: Diagnosis not present

## 2016-10-23 DIAGNOSIS — M25512 Pain in left shoulder: Secondary | ICD-10-CM | POA: Diagnosis not present

## 2016-10-27 DIAGNOSIS — I5033 Acute on chronic diastolic (congestive) heart failure: Secondary | ICD-10-CM | POA: Diagnosis not present

## 2016-10-27 DIAGNOSIS — J449 Chronic obstructive pulmonary disease, unspecified: Secondary | ICD-10-CM | POA: Diagnosis not present

## 2016-10-27 DIAGNOSIS — C3492 Malignant neoplasm of unspecified part of left bronchus or lung: Secondary | ICD-10-CM | POA: Diagnosis not present

## 2016-10-27 DIAGNOSIS — J9610 Chronic respiratory failure, unspecified whether with hypoxia or hypercapnia: Secondary | ICD-10-CM | POA: Diagnosis not present

## 2016-10-27 DIAGNOSIS — I110 Hypertensive heart disease with heart failure: Secondary | ICD-10-CM | POA: Diagnosis not present

## 2016-10-27 DIAGNOSIS — M25512 Pain in left shoulder: Secondary | ICD-10-CM | POA: Diagnosis not present

## 2016-10-28 DIAGNOSIS — M25512 Pain in left shoulder: Secondary | ICD-10-CM | POA: Diagnosis not present

## 2016-10-28 DIAGNOSIS — J449 Chronic obstructive pulmonary disease, unspecified: Secondary | ICD-10-CM | POA: Diagnosis not present

## 2016-10-28 DIAGNOSIS — I110 Hypertensive heart disease with heart failure: Secondary | ICD-10-CM | POA: Diagnosis not present

## 2016-10-28 DIAGNOSIS — C3492 Malignant neoplasm of unspecified part of left bronchus or lung: Secondary | ICD-10-CM | POA: Diagnosis not present

## 2016-10-28 DIAGNOSIS — I5033 Acute on chronic diastolic (congestive) heart failure: Secondary | ICD-10-CM | POA: Diagnosis not present

## 2016-10-28 DIAGNOSIS — J9610 Chronic respiratory failure, unspecified whether with hypoxia or hypercapnia: Secondary | ICD-10-CM | POA: Diagnosis not present

## 2016-10-29 DIAGNOSIS — J9610 Chronic respiratory failure, unspecified whether with hypoxia or hypercapnia: Secondary | ICD-10-CM | POA: Diagnosis not present

## 2016-10-29 DIAGNOSIS — M25512 Pain in left shoulder: Secondary | ICD-10-CM | POA: Diagnosis not present

## 2016-10-29 DIAGNOSIS — I110 Hypertensive heart disease with heart failure: Secondary | ICD-10-CM | POA: Diagnosis not present

## 2016-10-29 DIAGNOSIS — I5033 Acute on chronic diastolic (congestive) heart failure: Secondary | ICD-10-CM | POA: Diagnosis not present

## 2016-10-29 DIAGNOSIS — J449 Chronic obstructive pulmonary disease, unspecified: Secondary | ICD-10-CM | POA: Diagnosis not present

## 2016-10-29 DIAGNOSIS — C3492 Malignant neoplasm of unspecified part of left bronchus or lung: Secondary | ICD-10-CM | POA: Diagnosis not present

## 2016-10-30 DIAGNOSIS — J449 Chronic obstructive pulmonary disease, unspecified: Secondary | ICD-10-CM | POA: Diagnosis not present

## 2016-10-30 DIAGNOSIS — C3492 Malignant neoplasm of unspecified part of left bronchus or lung: Secondary | ICD-10-CM | POA: Diagnosis not present

## 2016-10-30 DIAGNOSIS — I5033 Acute on chronic diastolic (congestive) heart failure: Secondary | ICD-10-CM | POA: Diagnosis not present

## 2016-10-30 DIAGNOSIS — J9610 Chronic respiratory failure, unspecified whether with hypoxia or hypercapnia: Secondary | ICD-10-CM | POA: Diagnosis not present

## 2016-10-30 DIAGNOSIS — I110 Hypertensive heart disease with heart failure: Secondary | ICD-10-CM | POA: Diagnosis not present

## 2016-10-30 DIAGNOSIS — M25512 Pain in left shoulder: Secondary | ICD-10-CM | POA: Diagnosis not present

## 2016-11-03 DIAGNOSIS — M25512 Pain in left shoulder: Secondary | ICD-10-CM | POA: Diagnosis not present

## 2016-11-03 DIAGNOSIS — I110 Hypertensive heart disease with heart failure: Secondary | ICD-10-CM | POA: Diagnosis not present

## 2016-11-03 DIAGNOSIS — J9610 Chronic respiratory failure, unspecified whether with hypoxia or hypercapnia: Secondary | ICD-10-CM | POA: Diagnosis not present

## 2016-11-03 DIAGNOSIS — J449 Chronic obstructive pulmonary disease, unspecified: Secondary | ICD-10-CM | POA: Diagnosis not present

## 2016-11-03 DIAGNOSIS — I5033 Acute on chronic diastolic (congestive) heart failure: Secondary | ICD-10-CM | POA: Diagnosis not present

## 2016-11-03 DIAGNOSIS — C3492 Malignant neoplasm of unspecified part of left bronchus or lung: Secondary | ICD-10-CM | POA: Diagnosis not present

## 2016-11-04 DIAGNOSIS — C3492 Malignant neoplasm of unspecified part of left bronchus or lung: Secondary | ICD-10-CM | POA: Diagnosis not present

## 2016-11-04 DIAGNOSIS — J9610 Chronic respiratory failure, unspecified whether with hypoxia or hypercapnia: Secondary | ICD-10-CM | POA: Diagnosis not present

## 2016-11-04 DIAGNOSIS — I5033 Acute on chronic diastolic (congestive) heart failure: Secondary | ICD-10-CM | POA: Diagnosis not present

## 2016-11-04 DIAGNOSIS — I110 Hypertensive heart disease with heart failure: Secondary | ICD-10-CM | POA: Diagnosis not present

## 2016-11-04 DIAGNOSIS — J449 Chronic obstructive pulmonary disease, unspecified: Secondary | ICD-10-CM | POA: Diagnosis not present

## 2016-11-04 DIAGNOSIS — M25512 Pain in left shoulder: Secondary | ICD-10-CM | POA: Diagnosis not present

## 2016-11-05 DIAGNOSIS — I5033 Acute on chronic diastolic (congestive) heart failure: Secondary | ICD-10-CM | POA: Diagnosis not present

## 2016-11-05 DIAGNOSIS — C3492 Malignant neoplasm of unspecified part of left bronchus or lung: Secondary | ICD-10-CM | POA: Diagnosis not present

## 2016-11-05 DIAGNOSIS — I110 Hypertensive heart disease with heart failure: Secondary | ICD-10-CM | POA: Diagnosis not present

## 2016-11-05 DIAGNOSIS — J9610 Chronic respiratory failure, unspecified whether with hypoxia or hypercapnia: Secondary | ICD-10-CM | POA: Diagnosis not present

## 2016-11-05 DIAGNOSIS — M25512 Pain in left shoulder: Secondary | ICD-10-CM | POA: Diagnosis not present

## 2016-11-05 DIAGNOSIS — J449 Chronic obstructive pulmonary disease, unspecified: Secondary | ICD-10-CM | POA: Diagnosis not present

## 2016-11-06 DIAGNOSIS — I5033 Acute on chronic diastolic (congestive) heart failure: Secondary | ICD-10-CM | POA: Diagnosis not present

## 2016-11-06 DIAGNOSIS — J449 Chronic obstructive pulmonary disease, unspecified: Secondary | ICD-10-CM | POA: Diagnosis not present

## 2016-11-06 DIAGNOSIS — C3492 Malignant neoplasm of unspecified part of left bronchus or lung: Secondary | ICD-10-CM | POA: Diagnosis not present

## 2016-11-06 DIAGNOSIS — J9610 Chronic respiratory failure, unspecified whether with hypoxia or hypercapnia: Secondary | ICD-10-CM | POA: Diagnosis not present

## 2016-11-06 DIAGNOSIS — M25512 Pain in left shoulder: Secondary | ICD-10-CM | POA: Diagnosis not present

## 2016-11-06 DIAGNOSIS — I110 Hypertensive heart disease with heart failure: Secondary | ICD-10-CM | POA: Diagnosis not present

## 2016-11-09 DIAGNOSIS — J9610 Chronic respiratory failure, unspecified whether with hypoxia or hypercapnia: Secondary | ICD-10-CM | POA: Diagnosis not present

## 2016-11-09 DIAGNOSIS — I110 Hypertensive heart disease with heart failure: Secondary | ICD-10-CM | POA: Diagnosis not present

## 2016-11-09 DIAGNOSIS — I5033 Acute on chronic diastolic (congestive) heart failure: Secondary | ICD-10-CM | POA: Diagnosis not present

## 2016-11-09 DIAGNOSIS — J449 Chronic obstructive pulmonary disease, unspecified: Secondary | ICD-10-CM | POA: Diagnosis not present

## 2016-11-09 DIAGNOSIS — C3492 Malignant neoplasm of unspecified part of left bronchus or lung: Secondary | ICD-10-CM | POA: Diagnosis not present

## 2016-11-09 DIAGNOSIS — M25512 Pain in left shoulder: Secondary | ICD-10-CM | POA: Diagnosis not present

## 2016-11-10 DIAGNOSIS — C3492 Malignant neoplasm of unspecified part of left bronchus or lung: Secondary | ICD-10-CM | POA: Diagnosis not present

## 2016-11-10 DIAGNOSIS — M25512 Pain in left shoulder: Secondary | ICD-10-CM | POA: Diagnosis not present

## 2016-11-10 DIAGNOSIS — I110 Hypertensive heart disease with heart failure: Secondary | ICD-10-CM | POA: Diagnosis not present

## 2016-11-10 DIAGNOSIS — J9610 Chronic respiratory failure, unspecified whether with hypoxia or hypercapnia: Secondary | ICD-10-CM | POA: Diagnosis not present

## 2016-11-10 DIAGNOSIS — I5033 Acute on chronic diastolic (congestive) heart failure: Secondary | ICD-10-CM | POA: Diagnosis not present

## 2016-11-10 DIAGNOSIS — J449 Chronic obstructive pulmonary disease, unspecified: Secondary | ICD-10-CM | POA: Diagnosis not present

## 2016-11-11 DIAGNOSIS — M25512 Pain in left shoulder: Secondary | ICD-10-CM | POA: Diagnosis not present

## 2016-11-11 DIAGNOSIS — J449 Chronic obstructive pulmonary disease, unspecified: Secondary | ICD-10-CM | POA: Diagnosis not present

## 2016-11-11 DIAGNOSIS — J9610 Chronic respiratory failure, unspecified whether with hypoxia or hypercapnia: Secondary | ICD-10-CM | POA: Diagnosis not present

## 2016-11-11 DIAGNOSIS — I5033 Acute on chronic diastolic (congestive) heart failure: Secondary | ICD-10-CM | POA: Diagnosis not present

## 2016-11-11 DIAGNOSIS — I110 Hypertensive heart disease with heart failure: Secondary | ICD-10-CM | POA: Diagnosis not present

## 2016-11-11 DIAGNOSIS — C3492 Malignant neoplasm of unspecified part of left bronchus or lung: Secondary | ICD-10-CM | POA: Diagnosis not present

## 2016-11-12 DIAGNOSIS — C3492 Malignant neoplasm of unspecified part of left bronchus or lung: Secondary | ICD-10-CM | POA: Diagnosis not present

## 2016-11-12 DIAGNOSIS — J9610 Chronic respiratory failure, unspecified whether with hypoxia or hypercapnia: Secondary | ICD-10-CM | POA: Diagnosis not present

## 2016-11-12 DIAGNOSIS — M25512 Pain in left shoulder: Secondary | ICD-10-CM | POA: Diagnosis not present

## 2016-11-12 DIAGNOSIS — I110 Hypertensive heart disease with heart failure: Secondary | ICD-10-CM | POA: Diagnosis not present

## 2016-11-12 DIAGNOSIS — I5033 Acute on chronic diastolic (congestive) heart failure: Secondary | ICD-10-CM | POA: Diagnosis not present

## 2016-11-12 DIAGNOSIS — J449 Chronic obstructive pulmonary disease, unspecified: Secondary | ICD-10-CM | POA: Diagnosis not present

## 2016-11-13 DIAGNOSIS — C3490 Malignant neoplasm of unspecified part of unspecified bronchus or lung: Secondary | ICD-10-CM | POA: Diagnosis not present

## 2016-11-13 DIAGNOSIS — Z79899 Other long term (current) drug therapy: Secondary | ICD-10-CM | POA: Diagnosis not present

## 2016-11-13 DIAGNOSIS — E785 Hyperlipidemia, unspecified: Secondary | ICD-10-CM | POA: Diagnosis not present

## 2016-11-16 DIAGNOSIS — J449 Chronic obstructive pulmonary disease, unspecified: Secondary | ICD-10-CM | POA: Diagnosis not present

## 2016-11-16 DIAGNOSIS — J9610 Chronic respiratory failure, unspecified whether with hypoxia or hypercapnia: Secondary | ICD-10-CM | POA: Diagnosis not present

## 2016-11-16 DIAGNOSIS — I110 Hypertensive heart disease with heart failure: Secondary | ICD-10-CM | POA: Diagnosis not present

## 2016-11-16 DIAGNOSIS — M25512 Pain in left shoulder: Secondary | ICD-10-CM | POA: Diagnosis not present

## 2016-11-16 DIAGNOSIS — C3492 Malignant neoplasm of unspecified part of left bronchus or lung: Secondary | ICD-10-CM | POA: Diagnosis not present

## 2016-11-16 DIAGNOSIS — I5033 Acute on chronic diastolic (congestive) heart failure: Secondary | ICD-10-CM | POA: Diagnosis not present

## 2016-11-17 ENCOUNTER — Other Ambulatory Visit: Payer: Self-pay

## 2016-11-17 DIAGNOSIS — M25512 Pain in left shoulder: Secondary | ICD-10-CM | POA: Diagnosis not present

## 2016-11-17 DIAGNOSIS — D500 Iron deficiency anemia secondary to blood loss (chronic): Secondary | ICD-10-CM

## 2016-11-17 DIAGNOSIS — C3492 Malignant neoplasm of unspecified part of left bronchus or lung: Secondary | ICD-10-CM | POA: Diagnosis not present

## 2016-11-17 DIAGNOSIS — J449 Chronic obstructive pulmonary disease, unspecified: Secondary | ICD-10-CM | POA: Diagnosis not present

## 2016-11-17 DIAGNOSIS — I5033 Acute on chronic diastolic (congestive) heart failure: Secondary | ICD-10-CM | POA: Diagnosis not present

## 2016-11-17 DIAGNOSIS — J9610 Chronic respiratory failure, unspecified whether with hypoxia or hypercapnia: Secondary | ICD-10-CM | POA: Diagnosis not present

## 2016-11-17 DIAGNOSIS — I110 Hypertensive heart disease with heart failure: Secondary | ICD-10-CM | POA: Diagnosis not present

## 2016-11-17 NOTE — Progress Notes (Signed)
[place]  Telephone:(336) [phone] Fax:(336) [phone]  ID: [name] OB: Feb 19, 1946  MR#: [id number]  DXA#:[id number]  Patient Care Team: Asencion Noble, MD as PCP - General (Internal Medicine) Minna Merritts, MD (Cardiology)  CHIEF COMPLAINT:  Iron deficiency anemia.  INTERVAL HISTORY: Patient returns to clinic today for further evaluation and consideration of IV Feraheme. He feels weak and fatigued, but otherwise is well. He is having increased diarrhea and is being evaluated by his PCP later this week. He has no neurologic complaints. He denies any fevers. He continues to have persistent shortness of breath. His appetite has improved and he denies any nausea, vomiting, or constipation. He has no urinary complaints. Patient otherwise feels well and offers no further specific complaints.  REVIEW OF SYSTEMS:   Review of Systems  Constitutional: Positive for malaise/fatigue. Negative for fever and weight loss.  Respiratory: Positive for shortness of breath. Negative for cough and hemoptysis.   Cardiovascular: Negative.  Negative for chest pain and leg swelling.  Gastrointestinal: Positive for diarrhea. Negative for abdominal pain, blood in stool and melena.  Genitourinary: Negative.   Musculoskeletal: Negative.   Neurological: Positive for weakness.  Psychiatric/Behavioral: Negative.  The patient is not nervous/anxious.     As per HPI. Otherwise, a complete review of systems is negative.  PAST MEDICAL HISTORY: Past Medical History:  Diagnosis Date  . Acute MI    pt denies  . Arthritis of lumbar spine ([place])   . Asthma   . Clotting disorder ([place])   . Collapsed lung    x3  . COPD (chronic obstructive pulmonary disease) ([place])   . Dyspnea   . History of benign thyroid tumor   . Lung cancer ([place])   . Mycobacterium avium complex ([place]) 06/24/2016  . Pneumonia   . Pneumothorax   . Small cell carcinoma of lung ([place])   . Spontaneous pneumothorax     PAST  SURGICAL HISTORY: Past Surgical History:  Procedure Laterality Date  . APPENDECTOMY    . benign thryoid nodule resected    . CARDIAC CATHETERIZATION  2013   [facility]  . CHEST TUBE INSERTION    . COLONOSCOPY N/A 08/30/2015   Procedure: COLONOSCOPY;  Surgeon: Rogene Houston, MD;  [facility];  Service: Endoscopy;  Laterality: N/A;  1:45  . left upper lobectomy for small cell lung cancer    . VATS R thoracotomy      FAMILY HISTORY: Family History  Problem Relation Age of Onset  . Heart disease Mother   . Colon cancer Father   . Heart failure Brother     ADVANCED DIRECTIVES (Y/N):  N  HEALTH MAINTENANCE: Social History  Substance Use Topics  . Smoking status: Former Smoker    Packs/day: 3.00    Years: 30.00    Types: Cigars    Quit date: 03/02/1992  . Smokeless tobacco: Never Used     Comment: quit 22 yrs ago  . Alcohol use No     Colonoscopy:  PAP:  Bone density:  Lipid panel:  Allergies  Allergen Reactions  . Other Other (See Comments)    Hydromet cough syrup-causes GI upset  . Codeine Nausea And Vomiting    Current Outpatient Prescriptions  Medication Sig Dispense Refill  . acetaminophen (TYLENOL) 325 MG tablet Take 650 mg by mouth as needed.      Marland Kitchen albuterol (PROVENTIL HFA;VENTOLIN HFA) 108 (90 Base) MCG/ACT inhaler Inhale 2 puffs into the lungs every 6 (six) hours as  needed for wheezing or shortness of breath. 1 Inhaler 12  . aspirin 81 MG EC tablet Take 81 mg by mouth daily. Swallow whole.    Marland Kitchen atorvastatin (LIPITOR) 10 MG tablet TAKE 1 TABLET BY MOUTH EVERY DAY 90 tablet 3  . diphenoxylate-atropine (LOMOTIL) 2.5-0.025 MG tablet     . feeding supplement, ENSURE ENLIVE, (ENSURE ENLIVE) LIQD Take 237 mLs by mouth 2 (two) times daily between meals. 237 mL 12  . Fluticasone-Salmeterol (ADVAIR DISKUS) 100-50 MCG/DOSE AEPB Inhale 1 puff into the lungs 2 (two) times daily. 60 each 12  . furosemide (LASIX) 20 MG tablet Take 2 tablets (40 mg total) by mouth  daily. (Patient taking differently: Take 40 mg by mouth daily. ) 30 tablet   . guaiFENesin (MUCINEX) 600 MG 12 hr tablet Take 600 mg by mouth daily.     . Magnesium Oxide 400 (240 Mg) MG TABS Take 400 mg by mouth daily. 30 tablet   . ondansetron (ZOFRAN) 4 MG tablet Take 1 tablet (4 mg total) by mouth every 6 (six) hours as needed for nausea. 60 tablet 4  . potassium chloride (KLOR-CON) 20 MEQ packet Take 20 mEq by mouth daily.     . traMADol (ULTRAM) 50 MG tablet Take 1 tablet (50 mg total) by mouth every 6 (six) hours as needed. Takes 2 tablets 4 times daily as needed for pain. 20 tablet 0   No current facility-administered medications for this visit.     OBJECTIVE: Vitals:   11/18/16 1406  BP: 128/71  Pulse: 94  Resp: 18  Temp: (!) 95.4 F (35.2 C)     Body mass index is 22.51 kg/m.    ECOG FS:1 - Symptomatic but completely ambulatory  General: Well-developed, well-nourished, no acute distress. Eyes: Pink conjunctiva, anicteric sclera. Lungs: Clear to auscultation bilaterally. Heart: Regular rate and rhythm. No rubs, murmurs, or gallops. Abdomen: Soft, nontender, nondistended. No organomegaly noted, normoactive bowel sounds. Musculoskeletal: No edema, cyanosis, or clubbing. Neuro: Alert, answering all questions appropriately. Cranial nerves grossly intact. Skin: No rashes or petechiae noted. Psych: Normal affect.   LAB RESULTS:  Lab Results  Component Value Date   NA 134 (L) 08/12/2016   K 3.5 08/12/2016   CL 97 (L) 08/12/2016   CO2 25 08/12/2016   GLUCOSE 92 08/12/2016   BUN 9 08/12/2016   CREATININE 0.86 08/12/2016   CALCIUM 8.9 08/12/2016   PROT 6.1 (L) 08/12/2016   ALBUMIN 3.0 (L) 08/12/2016   AST 23 08/12/2016   ALT 30 08/12/2016   ALKPHOS 51 08/12/2016   BILITOT 0.9 08/12/2016   GFRNONAA >60 08/12/2016   GFRAA >60 08/12/2016    Lab Results  Component Value Date   WBC 10.5 11/18/2016   NEUTROABS 8.2 (H) 11/18/2016   HGB 10.0 (L) 11/18/2016   HCT 30.2  (L) 11/18/2016   MCV 82.7 11/18/2016   PLT 519 (H) 11/18/2016   Lab Results  Component Value Date   IRON 24 (L) 11/18/2016   TIBC 300 11/18/2016   IRONPCTSAT 8 (L) 11/18/2016   Lab Results  Component Value Date   FERRITIN 195 11/18/2016     STUDIES: No results found.  ASSESSMENT: Iron deficiency anemia.  PLAN:    1.  Iron deficiency anemia: Patient continues to have a persistently decreased hemoglobin and iron stores. Previously, the remainder of his anemia workup was either negative or within normal limits. Proceed with 510 mg IV Feraheme today. Return to clinic in 1 week for a second infusion.  He will then return to clinic in 2 months with repeat laboratory work and further evaluation. 2. Shortness of breath/MAC: Continue treatment and evaluation for pulmonary and infectious disease. 3. Cardiac disease: Continue follow-up and evaluation with cardiology as needed. 4. Diarrhea: Continue evaluation and treatment per PCP.  Patient expressed understanding and was in agreement with this plan. He also understands that He can call clinic at any time with any questions, concerns, or complaints.    Lloyd Huger, MD   11/21/2016 12:50 PM

## 2016-11-18 ENCOUNTER — Inpatient Hospital Stay: Payer: Medicare Other

## 2016-11-18 ENCOUNTER — Inpatient Hospital Stay: Payer: Medicare Other | Attending: Oncology | Admitting: Oncology

## 2016-11-18 VITALS — BP 128/71 | HR 94 | Temp 95.4°F | Resp 18 | Wt 143.7 lb

## 2016-11-18 DIAGNOSIS — Z87891 Personal history of nicotine dependence: Secondary | ICD-10-CM | POA: Diagnosis not present

## 2016-11-18 DIAGNOSIS — J449 Chronic obstructive pulmonary disease, unspecified: Secondary | ICD-10-CM | POA: Diagnosis not present

## 2016-11-18 DIAGNOSIS — Z8701 Personal history of pneumonia (recurrent): Secondary | ICD-10-CM | POA: Diagnosis not present

## 2016-11-18 DIAGNOSIS — R531 Weakness: Secondary | ICD-10-CM | POA: Insufficient documentation

## 2016-11-18 DIAGNOSIS — Z800 Family history of malignant neoplasm of digestive organs: Secondary | ICD-10-CM | POA: Diagnosis not present

## 2016-11-18 DIAGNOSIS — D688 Other specified coagulation defects: Secondary | ICD-10-CM | POA: Diagnosis not present

## 2016-11-18 DIAGNOSIS — I2510 Atherosclerotic heart disease of native coronary artery without angina pectoris: Secondary | ICD-10-CM | POA: Diagnosis not present

## 2016-11-18 DIAGNOSIS — Z7982 Long term (current) use of aspirin: Secondary | ICD-10-CM | POA: Diagnosis not present

## 2016-11-18 DIAGNOSIS — R5383 Other fatigue: Secondary | ICD-10-CM | POA: Diagnosis not present

## 2016-11-18 DIAGNOSIS — Z79899 Other long term (current) drug therapy: Secondary | ICD-10-CM | POA: Diagnosis not present

## 2016-11-18 DIAGNOSIS — I252 Old myocardial infarction: Secondary | ICD-10-CM | POA: Insufficient documentation

## 2016-11-18 DIAGNOSIS — R0602 Shortness of breath: Secondary | ICD-10-CM | POA: Insufficient documentation

## 2016-11-18 DIAGNOSIS — D500 Iron deficiency anemia secondary to blood loss (chronic): Secondary | ICD-10-CM

## 2016-11-18 DIAGNOSIS — Z85118 Personal history of other malignant neoplasm of bronchus and lung: Secondary | ICD-10-CM | POA: Insufficient documentation

## 2016-11-18 DIAGNOSIS — M4696 Unspecified inflammatory spondylopathy, lumbar region: Secondary | ICD-10-CM | POA: Diagnosis not present

## 2016-11-18 DIAGNOSIS — Z9049 Acquired absence of other specified parts of digestive tract: Secondary | ICD-10-CM | POA: Insufficient documentation

## 2016-11-18 DIAGNOSIS — R197 Diarrhea, unspecified: Secondary | ICD-10-CM | POA: Insufficient documentation

## 2016-11-18 DIAGNOSIS — D509 Iron deficiency anemia, unspecified: Secondary | ICD-10-CM | POA: Insufficient documentation

## 2016-11-18 LAB — CBC WITH DIFFERENTIAL/PLATELET
Basophils Absolute: 0.1 10*3/uL (ref 0–0.1)
Basophils Relative: 1 %
EOS PCT: 4 %
Eosinophils Absolute: 0.4 10*3/uL (ref 0–0.7)
HCT: 30.2 % — ABNORMAL LOW (ref 40.0–52.0)
Hemoglobin: 10 g/dL — ABNORMAL LOW (ref 13.0–18.0)
LYMPHS PCT: 9 %
Lymphs Abs: 0.9 10*3/uL — ABNORMAL LOW (ref 1.0–3.6)
MCH: 27.3 pg (ref 26.0–34.0)
MCHC: 33 g/dL (ref 32.0–36.0)
MCV: 82.7 fL (ref 80.0–100.0)
Monocytes Absolute: 0.9 10*3/uL (ref 0.2–1.0)
Monocytes Relative: 8 %
Neutro Abs: 8.2 10*3/uL — ABNORMAL HIGH (ref 1.4–6.5)
Neutrophils Relative %: 78 %
PLATELETS: 519 10*3/uL — AB (ref 150–440)
RBC: 3.65 MIL/uL — AB (ref 4.40–5.90)
RDW: 17.9 % — ABNORMAL HIGH (ref 11.5–14.5)
WBC: 10.5 10*3/uL (ref 3.8–10.6)

## 2016-11-18 LAB — IRON AND TIBC
Iron: 24 ug/dL — ABNORMAL LOW (ref 45–182)
SATURATION RATIOS: 8 % — AB (ref 17.9–39.5)
TIBC: 300 ug/dL (ref 250–450)
UIBC: 276 ug/dL

## 2016-11-18 LAB — FERRITIN: FERRITIN: 195 ng/mL (ref 24–336)

## 2016-11-18 MED ORDER — FERUMOXYTOL INJECTION 510 MG/17 ML
510.0000 mg | Freq: Once | INTRAVENOUS | Status: AC
  Administered 2016-11-18: 510 mg via INTRAVENOUS
  Filled 2016-11-18: qty 17

## 2016-11-18 MED ORDER — SODIUM CHLORIDE 0.9 % IV SOLN
Freq: Once | INTRAVENOUS | Status: AC
  Administered 2016-11-18: 15:00:00 via INTRAVENOUS
  Filled 2016-11-18: qty 1000

## 2016-11-18 NOTE — Progress Notes (Signed)
Here for follow up. Has been experiencing diarrhea-will see pcp this Friday

## 2016-11-19 DIAGNOSIS — J9610 Chronic respiratory failure, unspecified whether with hypoxia or hypercapnia: Secondary | ICD-10-CM | POA: Diagnosis not present

## 2016-11-19 DIAGNOSIS — C3492 Malignant neoplasm of unspecified part of left bronchus or lung: Secondary | ICD-10-CM | POA: Diagnosis not present

## 2016-11-19 DIAGNOSIS — I110 Hypertensive heart disease with heart failure: Secondary | ICD-10-CM | POA: Diagnosis not present

## 2016-11-19 DIAGNOSIS — I5033 Acute on chronic diastolic (congestive) heart failure: Secondary | ICD-10-CM | POA: Diagnosis not present

## 2016-11-19 DIAGNOSIS — J449 Chronic obstructive pulmonary disease, unspecified: Secondary | ICD-10-CM | POA: Diagnosis not present

## 2016-11-19 DIAGNOSIS — M25512 Pain in left shoulder: Secondary | ICD-10-CM | POA: Diagnosis not present

## 2016-11-20 DIAGNOSIS — C3492 Malignant neoplasm of unspecified part of left bronchus or lung: Secondary | ICD-10-CM | POA: Diagnosis not present

## 2016-11-20 DIAGNOSIS — M25512 Pain in left shoulder: Secondary | ICD-10-CM | POA: Diagnosis not present

## 2016-11-20 DIAGNOSIS — I110 Hypertensive heart disease with heart failure: Secondary | ICD-10-CM | POA: Diagnosis not present

## 2016-11-20 DIAGNOSIS — R197 Diarrhea, unspecified: Secondary | ICD-10-CM | POA: Diagnosis not present

## 2016-11-20 DIAGNOSIS — J9610 Chronic respiratory failure, unspecified whether with hypoxia or hypercapnia: Secondary | ICD-10-CM | POA: Diagnosis not present

## 2016-11-20 DIAGNOSIS — I5032 Chronic diastolic (congestive) heart failure: Secondary | ICD-10-CM | POA: Diagnosis not present

## 2016-11-20 DIAGNOSIS — J449 Chronic obstructive pulmonary disease, unspecified: Secondary | ICD-10-CM | POA: Diagnosis not present

## 2016-11-20 DIAGNOSIS — Z6824 Body mass index (BMI) 24.0-24.9, adult: Secondary | ICD-10-CM | POA: Diagnosis not present

## 2016-11-20 DIAGNOSIS — I5033 Acute on chronic diastolic (congestive) heart failure: Secondary | ICD-10-CM | POA: Diagnosis not present

## 2016-11-23 DIAGNOSIS — J9610 Chronic respiratory failure, unspecified whether with hypoxia or hypercapnia: Secondary | ICD-10-CM | POA: Diagnosis not present

## 2016-11-23 DIAGNOSIS — I5033 Acute on chronic diastolic (congestive) heart failure: Secondary | ICD-10-CM | POA: Diagnosis not present

## 2016-11-23 DIAGNOSIS — J449 Chronic obstructive pulmonary disease, unspecified: Secondary | ICD-10-CM | POA: Diagnosis not present

## 2016-11-23 DIAGNOSIS — I110 Hypertensive heart disease with heart failure: Secondary | ICD-10-CM | POA: Diagnosis not present

## 2016-11-23 DIAGNOSIS — M25512 Pain in left shoulder: Secondary | ICD-10-CM | POA: Diagnosis not present

## 2016-11-23 DIAGNOSIS — C3492 Malignant neoplasm of unspecified part of left bronchus or lung: Secondary | ICD-10-CM | POA: Diagnosis not present

## 2016-11-25 ENCOUNTER — Inpatient Hospital Stay: Payer: Medicare Other

## 2016-11-25 VITALS — BP 119/73 | HR 84 | Temp 96.8°F | Resp 20

## 2016-11-25 DIAGNOSIS — M25512 Pain in left shoulder: Secondary | ICD-10-CM | POA: Diagnosis not present

## 2016-11-25 DIAGNOSIS — R197 Diarrhea, unspecified: Secondary | ICD-10-CM | POA: Diagnosis not present

## 2016-11-25 DIAGNOSIS — R5383 Other fatigue: Secondary | ICD-10-CM | POA: Diagnosis not present

## 2016-11-25 DIAGNOSIS — J449 Chronic obstructive pulmonary disease, unspecified: Secondary | ICD-10-CM | POA: Diagnosis not present

## 2016-11-25 DIAGNOSIS — C3492 Malignant neoplasm of unspecified part of left bronchus or lung: Secondary | ICD-10-CM | POA: Diagnosis not present

## 2016-11-25 DIAGNOSIS — J9610 Chronic respiratory failure, unspecified whether with hypoxia or hypercapnia: Secondary | ICD-10-CM | POA: Diagnosis not present

## 2016-11-25 DIAGNOSIS — Z79899 Other long term (current) drug therapy: Secondary | ICD-10-CM | POA: Diagnosis not present

## 2016-11-25 DIAGNOSIS — R0602 Shortness of breath: Secondary | ICD-10-CM | POA: Diagnosis not present

## 2016-11-25 DIAGNOSIS — I5033 Acute on chronic diastolic (congestive) heart failure: Secondary | ICD-10-CM | POA: Diagnosis not present

## 2016-11-25 DIAGNOSIS — D509 Iron deficiency anemia, unspecified: Secondary | ICD-10-CM | POA: Diagnosis not present

## 2016-11-25 DIAGNOSIS — I2510 Atherosclerotic heart disease of native coronary artery without angina pectoris: Secondary | ICD-10-CM | POA: Diagnosis not present

## 2016-11-25 DIAGNOSIS — D500 Iron deficiency anemia secondary to blood loss (chronic): Secondary | ICD-10-CM

## 2016-11-25 DIAGNOSIS — I110 Hypertensive heart disease with heart failure: Secondary | ICD-10-CM | POA: Diagnosis not present

## 2016-11-25 MED ORDER — SODIUM CHLORIDE 0.9 % IV SOLN
510.0000 mg | Freq: Once | INTRAVENOUS | Status: AC
  Administered 2016-11-25: 510 mg via INTRAVENOUS
  Filled 2016-11-25: qty 17

## 2016-11-25 MED ORDER — SODIUM CHLORIDE 0.9 % IV SOLN
INTRAVENOUS | Status: DC
  Administered 2016-11-25: 14:00:00 via INTRAVENOUS
  Filled 2016-11-25: qty 1000

## 2016-11-27 DIAGNOSIS — J449 Chronic obstructive pulmonary disease, unspecified: Secondary | ICD-10-CM | POA: Diagnosis not present

## 2016-11-27 DIAGNOSIS — C3492 Malignant neoplasm of unspecified part of left bronchus or lung: Secondary | ICD-10-CM | POA: Diagnosis not present

## 2016-11-27 DIAGNOSIS — J9610 Chronic respiratory failure, unspecified whether with hypoxia or hypercapnia: Secondary | ICD-10-CM | POA: Diagnosis not present

## 2016-11-27 DIAGNOSIS — I110 Hypertensive heart disease with heart failure: Secondary | ICD-10-CM | POA: Diagnosis not present

## 2016-11-27 DIAGNOSIS — I5033 Acute on chronic diastolic (congestive) heart failure: Secondary | ICD-10-CM | POA: Diagnosis not present

## 2016-11-27 DIAGNOSIS — M25512 Pain in left shoulder: Secondary | ICD-10-CM | POA: Diagnosis not present

## 2016-12-01 DIAGNOSIS — I110 Hypertensive heart disease with heart failure: Secondary | ICD-10-CM | POA: Diagnosis not present

## 2016-12-01 DIAGNOSIS — I5033 Acute on chronic diastolic (congestive) heart failure: Secondary | ICD-10-CM | POA: Diagnosis not present

## 2016-12-01 DIAGNOSIS — J449 Chronic obstructive pulmonary disease, unspecified: Secondary | ICD-10-CM | POA: Diagnosis not present

## 2016-12-01 DIAGNOSIS — C3492 Malignant neoplasm of unspecified part of left bronchus or lung: Secondary | ICD-10-CM | POA: Diagnosis not present

## 2016-12-01 DIAGNOSIS — M25512 Pain in left shoulder: Secondary | ICD-10-CM | POA: Diagnosis not present

## 2016-12-01 DIAGNOSIS — J9610 Chronic respiratory failure, unspecified whether with hypoxia or hypercapnia: Secondary | ICD-10-CM | POA: Diagnosis not present

## 2016-12-02 DIAGNOSIS — J449 Chronic obstructive pulmonary disease, unspecified: Secondary | ICD-10-CM | POA: Diagnosis not present

## 2016-12-02 DIAGNOSIS — I5033 Acute on chronic diastolic (congestive) heart failure: Secondary | ICD-10-CM | POA: Diagnosis not present

## 2016-12-02 DIAGNOSIS — C3492 Malignant neoplasm of unspecified part of left bronchus or lung: Secondary | ICD-10-CM | POA: Diagnosis not present

## 2016-12-02 DIAGNOSIS — I110 Hypertensive heart disease with heart failure: Secondary | ICD-10-CM | POA: Diagnosis not present

## 2016-12-02 DIAGNOSIS — J9610 Chronic respiratory failure, unspecified whether with hypoxia or hypercapnia: Secondary | ICD-10-CM | POA: Diagnosis not present

## 2016-12-02 DIAGNOSIS — M25512 Pain in left shoulder: Secondary | ICD-10-CM | POA: Diagnosis not present

## 2016-12-07 DIAGNOSIS — I5033 Acute on chronic diastolic (congestive) heart failure: Secondary | ICD-10-CM | POA: Diagnosis not present

## 2016-12-07 DIAGNOSIS — C3492 Malignant neoplasm of unspecified part of left bronchus or lung: Secondary | ICD-10-CM | POA: Diagnosis not present

## 2016-12-07 DIAGNOSIS — M25512 Pain in left shoulder: Secondary | ICD-10-CM | POA: Diagnosis not present

## 2016-12-07 DIAGNOSIS — J449 Chronic obstructive pulmonary disease, unspecified: Secondary | ICD-10-CM | POA: Diagnosis not present

## 2016-12-07 DIAGNOSIS — J9610 Chronic respiratory failure, unspecified whether with hypoxia or hypercapnia: Secondary | ICD-10-CM | POA: Diagnosis not present

## 2016-12-07 DIAGNOSIS — I110 Hypertensive heart disease with heart failure: Secondary | ICD-10-CM | POA: Diagnosis not present

## 2016-12-08 DIAGNOSIS — M25512 Pain in left shoulder: Secondary | ICD-10-CM | POA: Diagnosis not present

## 2016-12-08 DIAGNOSIS — I5033 Acute on chronic diastolic (congestive) heart failure: Secondary | ICD-10-CM | POA: Diagnosis not present

## 2016-12-08 DIAGNOSIS — J449 Chronic obstructive pulmonary disease, unspecified: Secondary | ICD-10-CM | POA: Diagnosis not present

## 2016-12-08 DIAGNOSIS — C3492 Malignant neoplasm of unspecified part of left bronchus or lung: Secondary | ICD-10-CM | POA: Diagnosis not present

## 2016-12-08 DIAGNOSIS — J9610 Chronic respiratory failure, unspecified whether with hypoxia or hypercapnia: Secondary | ICD-10-CM | POA: Diagnosis not present

## 2016-12-08 DIAGNOSIS — I110 Hypertensive heart disease with heart failure: Secondary | ICD-10-CM | POA: Diagnosis not present

## 2016-12-09 DIAGNOSIS — J9610 Chronic respiratory failure, unspecified whether with hypoxia or hypercapnia: Secondary | ICD-10-CM | POA: Diagnosis not present

## 2016-12-09 DIAGNOSIS — M25512 Pain in left shoulder: Secondary | ICD-10-CM | POA: Diagnosis not present

## 2016-12-09 DIAGNOSIS — I5033 Acute on chronic diastolic (congestive) heart failure: Secondary | ICD-10-CM | POA: Diagnosis not present

## 2016-12-09 DIAGNOSIS — C3492 Malignant neoplasm of unspecified part of left bronchus or lung: Secondary | ICD-10-CM | POA: Diagnosis not present

## 2016-12-09 DIAGNOSIS — I110 Hypertensive heart disease with heart failure: Secondary | ICD-10-CM | POA: Diagnosis not present

## 2016-12-09 DIAGNOSIS — J449 Chronic obstructive pulmonary disease, unspecified: Secondary | ICD-10-CM | POA: Diagnosis not present

## 2016-12-09 NOTE — Progress Notes (Signed)
[place]  Telephone:(336) [phone] Fax:(336) [phone]  ID: [name] OB: 20-Dec-1945  MR#: [id number]  TIW#:[id number]  Patient Care Team: Asencion Noble, MD as PCP - General (Internal Medicine) Minna Merritts, MD (Cardiology)  CHIEF COMPLAINT:  Iron deficiency anemia.  INTERVAL HISTORY: Patient returns to clinic today for further evaluation and consideration of IV Feraheme. He currently feels well and is asymptomatic. He does not complain of weakness or fatigue today. He has no neurologic complaints. He denies any fevers. He continues to have persistent shortness of breath. His appetite has improved and he denies any nausea, vomiting, or constipation. He has no urinary complaints. Patient otherwise feels well and offers no further specific complaints.  REVIEW OF SYSTEMS:   Review of Systems  Constitutional: Negative for fever, malaise/fatigue and weight loss.  Respiratory: Positive for shortness of breath. Negative for cough and hemoptysis.   Cardiovascular: Negative.  Negative for chest pain and leg swelling.  Gastrointestinal: Negative for abdominal pain, blood in stool, diarrhea and melena.  Genitourinary: Negative.   Musculoskeletal: Negative.   Neurological: Negative.  Negative for weakness.  Psychiatric/Behavioral: Negative.  The patient is not nervous/anxious.     As per HPI. Otherwise, a complete review of systems is negative.  PAST MEDICAL HISTORY: Past Medical History:  Diagnosis Date  . Acute MI    pt denies  . Arthritis of lumbar spine ([place])   . Asthma   . Clotting disorder ([place])   . Collapsed lung    x3  . COPD (chronic obstructive pulmonary disease) ([place])   . Dyspnea   . History of benign thyroid tumor   . Lung cancer ([place])   . Mycobacterium avium complex ([place]) 06/24/2016  . Pneumonia   . Pneumothorax   . Small cell carcinoma of lung ([place])   . Spontaneous pneumothorax     PAST SURGICAL HISTORY: Past Surgical History:  Procedure  Laterality Date  . APPENDECTOMY    . benign thryoid nodule resected    . CARDIAC CATHETERIZATION  2013   [facility]  . CHEST TUBE INSERTION    . COLONOSCOPY N/A 08/30/2015   Procedure: COLONOSCOPY;  Surgeon: Rogene Houston, MD;  [facility];  Service: Endoscopy;  Laterality: N/A;  1:45  . left upper lobectomy for small cell lung cancer    . VATS R thoracotomy      FAMILY HISTORY: Family History  Problem Relation Age of Onset  . Heart disease Mother   . Colon cancer Father   . Heart failure Brother     ADVANCED DIRECTIVES (Y/N):  N  HEALTH MAINTENANCE: Social History  Substance Use Topics  . Smoking status: Former Smoker    Packs/day: 3.00    Years: 30.00    Types: Cigars    Quit date: 03/02/1992  . Smokeless tobacco: Never Used     Comment: quit 22 yrs ago  . Alcohol use No     Colonoscopy:  PAP:  Bone density:  Lipid panel:  Allergies  Allergen Reactions  . Other Other (See Comments)    Hydromet cough syrup-causes GI upset  . Codeine Nausea And Vomiting    Current Outpatient Prescriptions  Medication Sig Dispense Refill  . acetaminophen (TYLENOL) 325 MG tablet Take 650 mg by mouth as needed.      Marland Kitchen albuterol (PROVENTIL HFA;VENTOLIN HFA) 108 (90 Base) MCG/ACT inhaler Inhale 2 puffs into the lungs every 6 (six) hours as needed for wheezing or shortness of breath. 1 Inhaler 12  .  aspirin 81 MG EC tablet Take 81 mg by mouth daily. Swallow whole.    Marland Kitchen atorvastatin (LIPITOR) 10 MG tablet TAKE 1 TABLET BY MOUTH EVERY DAY 90 tablet 3  . diphenoxylate-atropine (LOMOTIL) 2.5-0.025 MG tablet     . Fluticasone-Salmeterol (ADVAIR DISKUS) 100-50 MCG/DOSE AEPB Inhale 1 puff into the lungs 2 (two) times daily. 60 each 12  . furosemide (LASIX) 20 MG tablet Take 2 tablets (40 mg total) by mouth daily. (Patient taking differently: Take 40 mg by mouth daily. ) 30 tablet   . guaiFENesin (MUCINEX) 600 MG 12 hr tablet Take 600 mg by mouth daily.     . ondansetron (ZOFRAN) 4 MG  tablet Take 1 tablet (4 mg total) by mouth every 6 (six) hours as needed for nausea. 60 tablet 4  . potassium chloride (KLOR-CON) 20 MEQ packet Take 20 mEq by mouth daily.     . traMADol (ULTRAM) 50 MG tablet Take 1 tablet (50 mg total) by mouth every 6 (six) hours as needed. Takes 2 tablets 4 times daily as needed for pain. 20 tablet 0   No current facility-administered medications for this visit.     OBJECTIVE: Vitals:   12/10/16 1427  BP: 127/70  Pulse: 89  Temp: 98.3 F (36.8 C)     Body mass index is 23.29 kg/m.    ECOG FS:1 - Symptomatic but completely ambulatory  General: Well-developed, well-nourished, no acute distress. Eyes: Pink conjunctiva, anicteric sclera. Lungs: Clear to auscultation bilaterally. Heart: Regular rate and rhythm. No rubs, murmurs, or gallops. Abdomen: Soft, nontender, nondistended. No organomegaly noted, normoactive bowel sounds. Musculoskeletal: No edema, cyanosis, or clubbing. Neuro: Alert, answering all questions appropriately. Cranial nerves grossly intact. Skin: No rashes or petechiae noted. Psych: Normal affect.   LAB RESULTS:  Lab Results  Component Value Date   NA 134 (L) 08/12/2016   K 3.5 08/12/2016   CL 97 (L) 08/12/2016   CO2 25 08/12/2016   GLUCOSE 92 08/12/2016   BUN 9 08/12/2016   CREATININE 0.86 08/12/2016   CALCIUM 8.9 08/12/2016   PROT 6.1 (L) 08/12/2016   ALBUMIN 3.0 (L) 08/12/2016   AST 23 08/12/2016   ALT 30 08/12/2016   ALKPHOS 51 08/12/2016   BILITOT 0.9 08/12/2016   GFRNONAA >60 08/12/2016   GFRAA >60 08/12/2016    Lab Results  Component Value Date   WBC 10.5 12/10/2016   NEUTROABS 8.0 (H) 12/10/2016   HGB 10.6 (L) 12/10/2016   HCT 31.7 (L) 12/10/2016   MCV 84.5 12/10/2016   PLT 374 12/10/2016   Lab Results  Component Value Date   IRON 29 (L) 12/10/2016   TIBC 264 12/10/2016   IRONPCTSAT 11 (L) 12/10/2016   Lab Results  Component Value Date   FERRITIN 312 12/10/2016     STUDIES: No results  found.  ASSESSMENT: Iron deficiency anemia.  PLAN:    1.  Iron deficiency anemia: Patient's hemoglobin and iron stores continue to remain mildly low that improved. He is also less symptomatic. He does not require IV iron today, but will likely need an infusion at his next clinic visit. He last received IV iron in February 2018. Previously, the remainder of his anemia workup was either negative or within normal limits. Return to clinic in 3 months with repeat laboratory work and further evaluation. 2. Shortness of breath/MAC: Continue treatment and evaluation for pulmonary and infectious disease. 3. Cardiac disease: Continue follow-up and evaluation with cardiology as needed. 4. Diarrhea: Patient does not complain  of this today. Continue evaluation and treatment per PCP.  Patient expressed understanding and was in agreement with this plan. He also understands that He can call clinic at any time with any questions, concerns, or complaints.    Lloyd Huger, MD   12/13/2016 10:23 AM

## 2016-12-10 ENCOUNTER — Inpatient Hospital Stay (HOSPITAL_BASED_OUTPATIENT_CLINIC_OR_DEPARTMENT_OTHER): Payer: Medicare Other | Admitting: Oncology

## 2016-12-10 ENCOUNTER — Inpatient Hospital Stay: Payer: Medicare Other | Attending: Oncology

## 2016-12-10 ENCOUNTER — Inpatient Hospital Stay: Payer: Medicare Other

## 2016-12-10 VITALS — BP 127/70 | HR 89 | Temp 98.3°F | Wt 148.7 lb

## 2016-12-10 DIAGNOSIS — D500 Iron deficiency anemia secondary to blood loss (chronic): Secondary | ICD-10-CM

## 2016-12-10 DIAGNOSIS — R0602 Shortness of breath: Secondary | ICD-10-CM | POA: Insufficient documentation

## 2016-12-10 DIAGNOSIS — Z79899 Other long term (current) drug therapy: Secondary | ICD-10-CM

## 2016-12-10 DIAGNOSIS — I252 Old myocardial infarction: Secondary | ICD-10-CM | POA: Insufficient documentation

## 2016-12-10 DIAGNOSIS — Z87891 Personal history of nicotine dependence: Secondary | ICD-10-CM | POA: Insufficient documentation

## 2016-12-10 DIAGNOSIS — J449 Chronic obstructive pulmonary disease, unspecified: Secondary | ICD-10-CM

## 2016-12-10 DIAGNOSIS — D509 Iron deficiency anemia, unspecified: Secondary | ICD-10-CM

## 2016-12-10 DIAGNOSIS — Z800 Family history of malignant neoplasm of digestive organs: Secondary | ICD-10-CM | POA: Insufficient documentation

## 2016-12-10 DIAGNOSIS — Z85118 Personal history of other malignant neoplasm of bronchus and lung: Secondary | ICD-10-CM | POA: Diagnosis not present

## 2016-12-10 DIAGNOSIS — R197 Diarrhea, unspecified: Secondary | ICD-10-CM

## 2016-12-10 DIAGNOSIS — Z7982 Long term (current) use of aspirin: Secondary | ICD-10-CM | POA: Diagnosis not present

## 2016-12-10 DIAGNOSIS — J45909 Unspecified asthma, uncomplicated: Secondary | ICD-10-CM | POA: Diagnosis not present

## 2016-12-10 LAB — CBC WITH DIFFERENTIAL/PLATELET
BASOS ABS: 0.1 10*3/uL (ref 0–0.1)
BASOS PCT: 1 %
EOS PCT: 5 %
Eosinophils Absolute: 0.6 10*3/uL (ref 0–0.7)
HCT: 31.7 % — ABNORMAL LOW (ref 40.0–52.0)
Hemoglobin: 10.6 g/dL — ABNORMAL LOW (ref 13.0–18.0)
LYMPHS PCT: 8 %
Lymphs Abs: 0.8 10*3/uL — ABNORMAL LOW (ref 1.0–3.6)
MCH: 28.4 pg (ref 26.0–34.0)
MCHC: 33.6 g/dL (ref 32.0–36.0)
MCV: 84.5 fL (ref 80.0–100.0)
Monocytes Absolute: 1.1 10*3/uL — ABNORMAL HIGH (ref 0.2–1.0)
Monocytes Relative: 11 %
Neutro Abs: 8 10*3/uL — ABNORMAL HIGH (ref 1.4–6.5)
Neutrophils Relative %: 75 %
PLATELETS: 374 10*3/uL (ref 150–440)
RBC: 3.74 MIL/uL — ABNORMAL LOW (ref 4.40–5.90)
RDW: 18.9 % — ABNORMAL HIGH (ref 11.5–14.5)
WBC: 10.5 10*3/uL (ref 3.8–10.6)

## 2016-12-10 LAB — IRON AND TIBC
IRON: 29 ug/dL — AB (ref 45–182)
SATURATION RATIOS: 11 % — AB (ref 17.9–39.5)
TIBC: 264 ug/dL (ref 250–450)
UIBC: 235 ug/dL

## 2016-12-10 LAB — FERRITIN: FERRITIN: 312 ng/mL (ref 24–336)

## 2016-12-10 NOTE — Progress Notes (Signed)
Patient here today for follow up.  Patient states no new concerns today  

## 2016-12-14 DIAGNOSIS — M25512 Pain in left shoulder: Secondary | ICD-10-CM | POA: Diagnosis not present

## 2016-12-14 DIAGNOSIS — I5033 Acute on chronic diastolic (congestive) heart failure: Secondary | ICD-10-CM | POA: Diagnosis not present

## 2016-12-14 DIAGNOSIS — I110 Hypertensive heart disease with heart failure: Secondary | ICD-10-CM | POA: Diagnosis not present

## 2016-12-14 DIAGNOSIS — C3492 Malignant neoplasm of unspecified part of left bronchus or lung: Secondary | ICD-10-CM | POA: Diagnosis not present

## 2016-12-14 DIAGNOSIS — J449 Chronic obstructive pulmonary disease, unspecified: Secondary | ICD-10-CM | POA: Diagnosis not present

## 2016-12-14 DIAGNOSIS — J9610 Chronic respiratory failure, unspecified whether with hypoxia or hypercapnia: Secondary | ICD-10-CM | POA: Diagnosis not present

## 2016-12-16 DIAGNOSIS — J9610 Chronic respiratory failure, unspecified whether with hypoxia or hypercapnia: Secondary | ICD-10-CM | POA: Diagnosis not present

## 2016-12-16 DIAGNOSIS — M25512 Pain in left shoulder: Secondary | ICD-10-CM | POA: Diagnosis not present

## 2016-12-16 DIAGNOSIS — I110 Hypertensive heart disease with heart failure: Secondary | ICD-10-CM | POA: Diagnosis not present

## 2016-12-16 DIAGNOSIS — C3492 Malignant neoplasm of unspecified part of left bronchus or lung: Secondary | ICD-10-CM | POA: Diagnosis not present

## 2016-12-16 DIAGNOSIS — J449 Chronic obstructive pulmonary disease, unspecified: Secondary | ICD-10-CM | POA: Diagnosis not present

## 2016-12-16 DIAGNOSIS — I5033 Acute on chronic diastolic (congestive) heart failure: Secondary | ICD-10-CM | POA: Diagnosis not present

## 2016-12-18 DIAGNOSIS — C3492 Malignant neoplasm of unspecified part of left bronchus or lung: Secondary | ICD-10-CM | POA: Diagnosis not present

## 2016-12-18 DIAGNOSIS — I5033 Acute on chronic diastolic (congestive) heart failure: Secondary | ICD-10-CM | POA: Diagnosis not present

## 2016-12-18 DIAGNOSIS — J449 Chronic obstructive pulmonary disease, unspecified: Secondary | ICD-10-CM | POA: Diagnosis not present

## 2016-12-18 DIAGNOSIS — I110 Hypertensive heart disease with heart failure: Secondary | ICD-10-CM | POA: Diagnosis not present

## 2016-12-18 DIAGNOSIS — M25512 Pain in left shoulder: Secondary | ICD-10-CM | POA: Diagnosis not present

## 2016-12-18 DIAGNOSIS — J9610 Chronic respiratory failure, unspecified whether with hypoxia or hypercapnia: Secondary | ICD-10-CM | POA: Diagnosis not present

## 2017-02-19 ENCOUNTER — Ambulatory Visit
Admission: RE | Admit: 2017-02-19 | Discharge: 2017-02-19 | Disposition: A | Discharge status: Home / Self Care | Payer: Medicare Other | Source: Ambulatory Visit | Attending: Pulmonary Disease | Admitting: Pulmonary Disease

## 2017-02-19 ENCOUNTER — Encounter: Payer: Self-pay | Admitting: Pulmonary Disease

## 2017-02-19 ENCOUNTER — Ambulatory Visit (INDEPENDENT_AMBULATORY_CARE_PROVIDER_SITE_OTHER): Payer: Medicare Other | Admitting: Pulmonary Disease

## 2017-02-19 VITALS — BP 120/82 | HR 88 | Ht 67.0 in | Wt 144.0 lb

## 2017-02-19 DIAGNOSIS — Z85118 Personal history of other malignant neoplasm of bronchus and lung: Secondary | ICD-10-CM

## 2017-02-19 DIAGNOSIS — J984 Other disorders of lung: Secondary | ICD-10-CM | POA: Diagnosis not present

## 2017-02-19 DIAGNOSIS — A310 Pulmonary mycobacterial infection: Secondary | ICD-10-CM | POA: Diagnosis not present

## 2017-02-19 DIAGNOSIS — R918 Other nonspecific abnormal finding of lung field: Secondary | ICD-10-CM | POA: Insufficient documentation

## 2017-02-19 DIAGNOSIS — J479 Bronchiectasis, uncomplicated: Secondary | ICD-10-CM | POA: Diagnosis not present

## 2017-02-19 DIAGNOSIS — Z87891 Personal history of nicotine dependence: Secondary | ICD-10-CM | POA: Diagnosis not present

## 2017-02-19 DIAGNOSIS — I5032 Chronic diastolic (congestive) heart failure: Secondary | ICD-10-CM | POA: Diagnosis not present

## 2017-02-19 DIAGNOSIS — Z79899 Other long term (current) drug therapy: Secondary | ICD-10-CM | POA: Diagnosis not present

## 2017-02-19 MED ORDER — FLUTICASONE-SALMETEROL 100-50 MCG/DOSE IN AEPB: 1.0000 | INHALATION_SPRAY | Freq: Two times a day (BID) | RESPIRATORY_TRACT | 12 refills | Status: DC

## 2017-02-19 NOTE — Patient Instructions (Signed)
Continue Advair and albuterol as needed  Continue chest percussion Follow-up in 6 months or as needed We should continue to obtain chest x-ray at least once a year

## 2017-02-22 NOTE — Progress Notes (Signed)
PULMONARY OFFICE FOLLOW UP  PROBLEMS: 1) Severe COPD 2) Pulmonary MAC - he has been intolerant to attempts @ Rx 3) Bronchiectasis 4) H/O lung cancer - S/P LLL resection 2000 5) Former smoker - quit 1995  INTERVAL HISTORY: No major events  SUBJ: No new complaints. Remains on Advair. Continues to have mild chronic mucus production. Denies CP, fever, purulent sputum, hemoptysis, LE edema and calf tenderness.  OBJ: Vitals:   02/19/17 1112 02/19/17 1113  BP:  120/82  Pulse:  88  SpO2:  100%  Weight: 144 lb (65.3 kg)   Height: _0  (1.702 m)   RA  WDWN, no overt distress HEENT WNL No JVD L > R basilar crackles, no wheezes Reg, no M NABS, soft No C/C/E   CURRENT PULMONARY THERAPIES Advair PRN albuterol Chest percussion  DATA: CXR 02/19/17: Postoperative changes of the left lung with left lung volume loss. 5.2 cm cystic area in the left lower lobe with an air-fluid level with a similar adjacent cystic area. Chronic bilateral lower lung interstitial lung disease  IMPRESSION: 1) Chronic hypoxic respiratory failure 2) Severe COPD 3) S/P LLL resection for lung cancer 2000 4) Pulmonary MAC with severe bronchiectasis    PLAN: 1) Cont Advair and PRN albuterol  2) Continue chest percussion 3) ROV 6 months 4) He should undergo annual CXR at a minimum   Merton Border, MD PCCM service Mobile [phone] Pager [phone] 02/22/2017

## 2017-02-26 DIAGNOSIS — J449 Chronic obstructive pulmonary disease, unspecified: Secondary | ICD-10-CM | POA: Diagnosis not present

## 2017-02-26 DIAGNOSIS — I5032 Chronic diastolic (congestive) heart failure: Secondary | ICD-10-CM | POA: Diagnosis not present

## 2017-03-03 NOTE — Progress Notes (Signed)
Echocardiogram 07/20/2016- Cardiology Office Note  Date:  03/05/2017   ID:  [name], [name] July 22, 1946, MRN [id number]  PCP:  Asencion Noble, MD   Chief Complaint  Patient presents with  . OTHER    6 month f/u no complaints today. Meds reviewed verbally with pt.    HPI:   [name] is a pleasant 71 year old gentleman with history of  lung cancer,  CT 05/19/2016 documenting a 7 cm cavity, bulla, left base Treated for mycobacteria, has not tolerated this well COPD  non-ST elevation MI after choking on a vitamin pill,  admission to [facility] 05/09/2012 with peak troponin 1.5.  cardiac catheterization that showed nonocclusive coronary artery disease, several regions of 20% stenosis otherwise normal ejection fraction, normal echocardiogram.  He presents for routine followup of his coronary artery disease, shortness of breath   History of Anemia Seen by Dr. Grayland Ormond for iron infusion HBG 8.3 to 10.6 Feels much better, shortness of breath improved, more energy Leg edema improved  Reports he is taking Lasix one a day, 20 mg  CR 1.0 normal potassium  Weight continues to drop, 5 pound weight loss in the past 2 months  Previously Admitted to the hospital with low sodium Sent  to [facility] for [facility]  In the hospital, Echo 07/20/2016   normal ejection fraction Mildly elevated right heart pressures  Lab work reviewed with him showing creatinine 1.01, BUN 18, potassium 4.6  EKG on today's visit shows normal sinus rhythm with rate 81 bpm, no significant ST or T-wave changes  Other past medical history reviewed Admitted to the hospital 07/20/2016 with hyponatremia, weakness, poor intake Prior hospital admission for COPD flare, had failure to thrive at home  Previous pneumonia Reports having long course of antibiotics, prednisone  CT 05/19/2016 documenting a 7 cm cavity, bulla, left base Also Atherosclerosis  Other lab work reviewed including Lung Cx: mycobacteria Lab work  from October 2016 with normal creatinine, sodium 132, chloride 91, will LFTs, hematocrit 33.9  Previous  problem with his eye, "Clot and bleeding " anticoagulation was held at that time by outside physician. Details are unavailable He has frequent follow-up with pulmonary for his COPD  hospital admission December 12 2011 with discharge 12/16/2013. Final diagnosis was acute respiratory failure, pneumonia with clinical sepsis, DVT with PE. He was started on xarelto initially 15 mg twice a day then changed to 20 mg daily   on chronic steroids for lung disease. Prior lung cancer 15 years ago with treatment at that time  Echocardiogram 12/13/2011 showed normal LV function, moderately elevated right ventricular systolic pressure Ultrasound of the leg showed DVT in the right posterior tibial vein branch otherwise no other DVT CT scan of the chest showed PE of the right middle lobe and posterior basal right lower lobe pulmonary arteries  Prior lab work, Total cholesterol 158, LDL 65, HDL 54  PMH:   has a past medical history of Acute MI ([place]); Arthritis of lumbar spine ([place]); Asthma; Clotting disorder ([place]); Collapsed lung; COPD (chronic obstructive pulmonary disease) ([place]); Dyspnea; History of benign thyroid tumor; Lung cancer ([place]); Mycobacterium avium complex ([place]) (06/24/2016); Pneumonia; Pneumothorax; Small cell carcinoma of lung ([place]); and Spontaneous pneumothorax.  PSH:    Past Surgical History:  Procedure Laterality Date  . APPENDECTOMY    . benign thryoid nodule resected    . CARDIAC CATHETERIZATION  2013   [facility]  . CHEST TUBE INSERTION    . COLONOSCOPY N/A 08/30/2015   Procedure: COLONOSCOPY;  Surgeon: Bernadene Person  Gloriann Loan, MD;  [facility];  Service: Endoscopy;  Laterality: N/A;  1:45  . left upper lobectomy for small cell lung cancer    . VATS R thoracotomy      Current Outpatient Prescriptions  Medication Sig Dispense Refill  . acetaminophen (TYLENOL) 325 MG tablet  Take 650 mg by mouth as needed.      Marland Kitchen albuterol (PROVENTIL HFA;VENTOLIN HFA) 108 (90 Base) MCG/ACT inhaler Inhale 2 puffs into the lungs every 6 (six) hours as needed for wheezing or shortness of breath. 1 Inhaler 12  . aspirin 81 MG EC tablet Take 81 mg by mouth daily. Swallow whole.    Marland Kitchen atorvastatin (LIPITOR) 10 MG tablet TAKE 1 TABLET BY MOUTH EVERY DAY 90 tablet 3  . diphenoxylate-atropine (LOMOTIL) 2.5-0.025 MG tablet     . Fluticasone-Salmeterol (ADVAIR DISKUS) 100-50 MCG/DOSE AEPB Inhale 1 puff into the lungs 2 (two) times daily. 60 each 12  . furosemide (LASIX) 20 MG tablet Take 2 tablets (40 mg total) by mouth daily. (Patient taking differently: Take 20 mg by mouth daily. ) 30 tablet   . guaiFENesin (MUCINEX) 600 MG 12 hr tablet Take 600 mg by mouth daily.     . ondansetron (ZOFRAN) 4 MG tablet Take 1 tablet (4 mg total) by mouth every 6 (six) hours as needed for nausea. 60 tablet 4  . potassium chloride (KLOR-CON) 20 MEQ packet Take 20 mEq by mouth daily.     . traMADol (ULTRAM) 50 MG tablet Take 1 tablet (50 mg total) by mouth every 6 (six) hours as needed. Takes 2 tablets 4 times daily as needed for pain. 20 tablet 0   No current facility-administered medications for this visit.      Allergies:   Other and Codeine   Social History:  The patient  reports that he quit smoking about 25 years ago. His smoking use included Cigars. He has a 90.00 pack-year smoking history. He has never used smokeless tobacco. He reports that he does not drink alcohol or use drugs.   Family History:   family history includes Colon cancer in his father; Heart disease in his mother; Heart failure in his brother.    Review of Systems: Review of Systems  Constitutional: Positive for weight loss.  Respiratory: Positive for cough.        Chest congestion  Cardiovascular: Negative.   Gastrointestinal: Negative.   Musculoskeletal: Negative.   Neurological: Negative.   Psychiatric/Behavioral: Negative.    All other systems reviewed and are negative.    PHYSICAL EXAM: VS:  BP 132/70 (BP Location: Left Arm, Patient Position: Sitting, Cuff Size: Normal)   Pulse 81   Ht _0  (1.575 m)   Wt 143 lb 8 oz (65.1 kg)   BMI 26.25 kg/m  , BMI Body mass index is 26.25 kg/m. GEN: Thin, in no acute distress  HEENT: normal  Neck: no JVD, carotid bruits, or masses Cardiac: RRR; no murmurs, rubs, or gallops,no edema  Respiratory:  Coarse breath sounds bilaterally, Rales,  normal work of breathing GI: soft, nontender, nondistended, + BS MS: no deformity or atrophy  Skin: warm and dry, no rash Neuro:  Strength and sensation are intact Psych: euthymic mood, full affect    Recent Labs: 07/20/2016: B Natriuretic Peptide 42.0 07/25/2016: TSH 0.650 07/29/2016: Magnesium 1.6 08/12/2016: ALT 30; BUN 9; Creatinine, Ser 0.86; Potassium 3.5; Sodium 134 12/10/2016: Hemoglobin 10.6; Platelets 374    Lipid Panel No results found for: CHOL, HDL, LDLCALC,  TRIG    Wt Readings from Last 3 Encounters:  03/05/17 143 lb 8 oz (65.1 kg)  02/19/17 144 lb (65.3 kg)  12/10/16 148 lb 11.2 oz (67.4 kg)       ASSESSMENT AND PLAN:  COPD exacerbation ([place]) follow-up with pulmonary. Cough has improved  Mycobacterium avium complex ([place]) Followed by Dr. Ola Spurr, does not tolerate antibiotics well Strength has improved from previous hospitalization, he overall feels well  Coronary artery disease involving native heart without angina pectoris, unspecified vessel or lesion type - Plan: EKG 12-Lead Currently with no symptoms of angina. No further workup at this time. Continue current medication regimen.  Other secondary hypertension - Plan: EKG 12-Lead Blood pressure is well controlled on today's visit. No changes made to the medications.  Mixed hyperlipidemia Currently on Lipitor  Other iron deficiency anemia - Plan: Ambulatory referral to Hematology / Oncology Seen by Dr. Grayland Ormond Feeling better with  improved blood count   Total encounter time more than 25 minutes  Greater than 50% was spent in counseling and coordination of care with the patient   Disposition:   F/U  6 months   No orders of the defined types were placed in this encounter.    Signed, Esmond Plants, M.D., Ph.D. 03/05/2017  [place], [place]

## 2017-03-04 ENCOUNTER — Ambulatory Visit: Payer: Medicare Other | Admitting: Cardiovascular Disease

## 2017-03-05 ENCOUNTER — Encounter: Payer: Self-pay | Admitting: Cardiovascular Disease

## 2017-03-05 ENCOUNTER — Ambulatory Visit (INDEPENDENT_AMBULATORY_CARE_PROVIDER_SITE_OTHER): Payer: Medicare Other | Admitting: Cardiovascular Disease

## 2017-03-05 VITALS — BP 132/70 | HR 81 | Ht 62.0 in | Wt 143.5 lb

## 2017-03-05 DIAGNOSIS — J470 Bronchiectasis with acute lower respiratory infection: Secondary | ICD-10-CM

## 2017-03-05 DIAGNOSIS — A310 Pulmonary mycobacterial infection: Secondary | ICD-10-CM

## 2017-03-05 DIAGNOSIS — J9610 Chronic respiratory failure, unspecified whether with hypoxia or hypercapnia: Secondary | ICD-10-CM

## 2017-03-05 DIAGNOSIS — J449 Chronic obstructive pulmonary disease, unspecified: Secondary | ICD-10-CM | POA: Diagnosis not present

## 2017-03-05 DIAGNOSIS — I214 Non-ST elevation (NSTEMI) myocardial infarction: Secondary | ICD-10-CM

## 2017-03-05 NOTE — Patient Instructions (Addendum)

## 2017-03-10 ENCOUNTER — Other Ambulatory Visit: Payer: Medicare Other

## 2017-03-10 ENCOUNTER — Ambulatory Visit: Payer: Medicare Other | Admitting: Oncology

## 2017-03-10 ENCOUNTER — Ambulatory Visit: Payer: Medicare Other

## 2017-03-23 NOTE — Progress Notes (Signed)
[name]  [phone] [phone]  ID: [name] OB: [birth year]  MR#: [id number]  EHM#:[id number]  Patient Care Team: Asencion Noble, MD as PCP - General (Internal Medicine) Minna Merritts, MD (Cardiology)  CHIEF COMPLAINT:  Iron deficiency anemia.  INTERVAL HISTORY: Patient returns to clinic today for further evaluation and consideration of IV Feraheme. He continues to feel well and is asymptomatic. He does not complain of weakness or fatigue today. He has no neurologic complaints. He denies any fevers. He continues to have persistent shortness of breath. His appetite has improved and he denies any nausea, vomiting, or constipation. He has no urinary complaints. Patient offers no specific complaints today.  REVIEW OF SYSTEMS:   Review of Systems  Constitutional: Negative for fever, malaise/fatigue and weight loss.  Respiratory: Negative for cough, hemoptysis and shortness of breath.   Cardiovascular: Negative.  Negative for chest pain and leg swelling.  Gastrointestinal: Negative for abdominal pain, blood in stool, diarrhea and melena.  Genitourinary: Negative.   Musculoskeletal: Negative.   Skin: Negative.  Negative for rash.  Neurological: Negative.  Negative for weakness.  Psychiatric/Behavioral: Negative.  The patient is not nervous/anxious.     As per HPI. Otherwise, a complete review of systems is negative.  PAST MEDICAL HISTORY: Past Medical History:  Diagnosis Date  . Acute MI [facility])    pt denies  . Arthritis of lumbar spine ([place])   . Asthma   . Clotting disorder ([place])   . Collapsed lung    x3  . COPD (chronic obstructive pulmonary disease) ([place])   . Dyspnea   . History of benign thyroid tumor   . Lung cancer ([place])   . Mycobacterium avium complex ([place]) 06/24/2016  . Pneumonia   . Pneumothorax   . Small cell carcinoma of lung ([place])   . Spontaneous pneumothorax     PAST SURGICAL HISTORY: Past Surgical History:    Procedure Laterality Date  . APPENDECTOMY    . benign thryoid nodule resected    . CARDIAC CATHETERIZATION  2013   [facility]  . CHEST TUBE INSERTION    . COLONOSCOPY N/A 08/30/2015   Procedure: COLONOSCOPY;  Surgeon: Rogene Houston, MD;  [facility];  Service: Endoscopy;  Laterality: N/A;  1:45  . left upper lobectomy for small cell lung cancer    . VATS R thoracotomy      FAMILY HISTORY: Family History  Problem Relation Age of Onset  . Heart disease Mother   . Colon cancer Father   . Heart failure Brother     ADVANCED DIRECTIVES (Y/N):  N  HEALTH MAINTENANCE: Social History  Substance Use Topics  . Smoking status: Former Smoker    Packs/day: 3.00    Years: 30.00    Types: Cigars    Quit date: 03/02/1992  . Smokeless tobacco: Never Used     Comment: quit 22 yrs ago  . Alcohol use No     Colonoscopy:  PAP:  Bone density:  Lipid panel:  Allergies  Allergen Reactions  . Other Other (See Comments)    Hydromet cough syrup-causes GI upset  . Codeine Nausea And Vomiting    Current Outpatient Prescriptions  Medication Sig Dispense Refill  . acetaminophen (TYLENOL) 325 MG tablet Take 650 mg by mouth as needed.      Marland Kitchen albuterol (PROVENTIL HFA;VENTOLIN HFA) 108 (90 Base) MCG/ACT inhaler Inhale 2 puffs into the lungs every 6 (six) hours as needed for wheezing or shortness of  breath. 1 Inhaler 12  . aspirin 81 MG EC tablet Take 81 mg by mouth daily. Swallow whole.    Marland Kitchen atorvastatin (LIPITOR) 10 MG tablet TAKE 1 TABLET BY MOUTH EVERY DAY 90 tablet 3  . diphenoxylate-atropine (LOMOTIL) 2.5-0.025 MG tablet     . Fluticasone-Salmeterol (ADVAIR DISKUS) 100-50 MCG/DOSE AEPB Inhale 1 puff into the lungs 2 (two) times daily. 60 each 12  . furosemide (LASIX) 20 MG tablet Take 2 tablets (40 mg total) by mouth daily. (Patient taking differently: Take 20 mg by mouth daily. ) 30 tablet   . guaiFENesin (MUCINEX) 600 MG 12 hr tablet Take 600 mg by mouth daily.     . ondansetron  (ZOFRAN) 4 MG tablet Take 1 tablet (4 mg total) by mouth every 6 (six) hours as needed for nausea. 60 tablet 4  . potassium chloride (KLOR-CON) 20 MEQ packet Take 20 mEq by mouth daily.     . traMADol (ULTRAM) 50 MG tablet Take 1 tablet (50 mg total) by mouth every 6 (six) hours as needed. Takes 2 tablets 4 times daily as needed for pain. 20 tablet 0   No current facility-administered medications for this visit.     OBJECTIVE: Vitals:   03/24/17 1346  BP: 138/72  Pulse: 86  Resp: 20  Temp: 98 F (36.7 C)     Body mass index is 22.9 kg/m.    ECOG FS:1 - Symptomatic but completely ambulatory  General: Well-developed, well-nourished, no acute distress. Eyes: Pink conjunctiva, anicteric sclera. Lungs: Clear to auscultation bilaterally. Heart: Regular rate and rhythm. No rubs, murmurs, or gallops. Abdomen: Soft, nontender, nondistended. No organomegaly noted, normoactive bowel sounds. Musculoskeletal: No edema, cyanosis, or clubbing. Neuro: Alert, answering all questions appropriately. Cranial nerves grossly intact. Skin: No rashes or petechiae noted. Psych: Normal affect.   LAB RESULTS:  Lab Results  Component Value Date   NA 134 (L) 08/12/2016   K 3.5 08/12/2016   CL 97 (L) 08/12/2016   CO2 25 08/12/2016   GLUCOSE 92 08/12/2016   BUN 9 08/12/2016   CREATININE 0.86 08/12/2016   CALCIUM 8.9 08/12/2016   PROT 6.1 (L) 08/12/2016   ALBUMIN 3.0 (L) 08/12/2016   AST 23 08/12/2016   ALT 30 08/12/2016   ALKPHOS 51 08/12/2016   BILITOT 0.9 08/12/2016   GFRNONAA >60 08/12/2016   GFRAA >60 08/12/2016    Lab Results  Component Value Date   WBC 12.3 (H) 03/24/2017   NEUTROABS 10.5 (H) 03/24/2017   HGB 11.5 (L) 03/24/2017   HCT 33.5 (L) 03/24/2017   MCV 86.6 03/24/2017   PLT 387 03/24/2017   Lab Results  Component Value Date   IRON 32 (L) 03/24/2017   TIBC 275 03/24/2017   IRONPCTSAT 12 (L) 03/24/2017   Lab Results  Component Value Date   FERRITIN 262 03/24/2017      STUDIES: No results found.  ASSESSMENT: Iron deficiency anemia.  PLAN:    1.  Iron deficiency anemia: Patient's hemoglobin and iron stores continue to remain mildly low but essentially unchanged. He is asymptomatic. He does not require IV iron today, but will likely need an infusion in the future. He last received IV iron in February 2018. Previously, the remainder of his anemia workup was either negative or within normal limits. Return to clinic in 3 months with repeat laboratory work and further evaluation. 2. Shortness of breath/MAC: Resolved. Follow up with Pulmonary and Infectious Disease as indicated. 3. Cardiac disease: Continue follow-up and evaluation with cardiology  as needed. 4. Diarrhea: Patient does not complain of this today. Continue evaluation and treatment per PCP.  Patient expressed understanding and was in agreement with this plan. He also understands that He can call clinic at any time with any questions, concerns, or complaints.    Lloyd Huger, MD   03/27/2017 10:48 PM

## 2017-03-24 ENCOUNTER — Inpatient Hospital Stay (HOSPITAL_BASED_OUTPATIENT_CLINIC_OR_DEPARTMENT_OTHER): Payer: Medicare Other | Admitting: Oncology

## 2017-03-24 ENCOUNTER — Inpatient Hospital Stay: Payer: Medicare Other | Attending: Oncology | Admitting: *Deleted

## 2017-03-24 ENCOUNTER — Inpatient Hospital Stay: Payer: Medicare Other

## 2017-03-24 VITALS — BP 138/72 | HR 86 | Temp 98.0°F | Resp 20 | Ht 67.0 in | Wt 146.2 lb

## 2017-03-24 DIAGNOSIS — J449 Chronic obstructive pulmonary disease, unspecified: Secondary | ICD-10-CM

## 2017-03-24 DIAGNOSIS — Z800 Family history of malignant neoplasm of digestive organs: Secondary | ICD-10-CM | POA: Insufficient documentation

## 2017-03-24 DIAGNOSIS — Z8701 Personal history of pneumonia (recurrent): Secondary | ICD-10-CM | POA: Diagnosis not present

## 2017-03-24 DIAGNOSIS — Z87891 Personal history of nicotine dependence: Secondary | ICD-10-CM | POA: Insufficient documentation

## 2017-03-24 DIAGNOSIS — R0602 Shortness of breath: Secondary | ICD-10-CM

## 2017-03-24 DIAGNOSIS — D509 Iron deficiency anemia, unspecified: Secondary | ICD-10-CM

## 2017-03-24 DIAGNOSIS — Z79899 Other long term (current) drug therapy: Secondary | ICD-10-CM | POA: Diagnosis not present

## 2017-03-24 DIAGNOSIS — D689 Coagulation defect, unspecified: Secondary | ICD-10-CM | POA: Insufficient documentation

## 2017-03-24 DIAGNOSIS — M129 Arthropathy, unspecified: Secondary | ICD-10-CM | POA: Insufficient documentation

## 2017-03-24 DIAGNOSIS — D500 Iron deficiency anemia secondary to blood loss (chronic): Secondary | ICD-10-CM

## 2017-03-24 LAB — CBC WITH DIFFERENTIAL/PLATELET
BASOS ABS: 0.1 10*3/uL (ref 0–0.1)
BASOS PCT: 1 %
EOS PCT: 4 %
Eosinophils Absolute: 0.4 10*3/uL (ref 0–0.7)
HCT: 33.5 % — ABNORMAL LOW (ref 40.0–52.0)
Hemoglobin: 11.5 g/dL — ABNORMAL LOW (ref 13.0–18.0)
LYMPHS PCT: 5 %
Lymphs Abs: 0.6 10*3/uL — ABNORMAL LOW (ref 1.0–3.6)
MCH: 29.6 pg (ref 26.0–34.0)
MCHC: 34.2 g/dL (ref 32.0–36.0)
MCV: 86.6 fL (ref 80.0–100.0)
MONO ABS: 0.6 10*3/uL (ref 0.2–1.0)
MONOS PCT: 5 %
Neutro Abs: 10.5 10*3/uL — ABNORMAL HIGH (ref 1.4–6.5)
Neutrophils Relative %: 85 %
PLATELETS: 387 10*3/uL (ref 150–440)
RBC: 3.87 MIL/uL — ABNORMAL LOW (ref 4.40–5.90)
RDW: 14.1 % (ref 11.5–14.5)
WBC: 12.3 10*3/uL — ABNORMAL HIGH (ref 3.8–10.6)

## 2017-03-24 LAB — IRON AND TIBC
Iron: 32 ug/dL — ABNORMAL LOW (ref 45–182)
Saturation Ratios: 12 % — ABNORMAL LOW (ref 17.9–39.5)
TIBC: 275 ug/dL (ref 250–450)
UIBC: 243 ug/dL

## 2017-03-24 LAB — FERRITIN: Ferritin: 262 ng/mL (ref 24–336)

## 2017-03-24 NOTE — Progress Notes (Signed)
Patient denies any concerns today.  

## 2017-04-26 DIAGNOSIS — H25813 Combined forms of age-related cataract, bilateral: Secondary | ICD-10-CM | POA: Diagnosis not present

## 2017-04-26 DIAGNOSIS — D3132 Benign neoplasm of left choroid: Secondary | ICD-10-CM | POA: Diagnosis not present

## 2017-04-27 DIAGNOSIS — H2513 Age-related nuclear cataract, bilateral: Secondary | ICD-10-CM | POA: Diagnosis not present

## 2017-04-29 DIAGNOSIS — Z85118 Personal history of other malignant neoplasm of bronchus and lung: Secondary | ICD-10-CM | POA: Diagnosis not present

## 2017-04-29 DIAGNOSIS — E785 Hyperlipidemia, unspecified: Secondary | ICD-10-CM | POA: Diagnosis not present

## 2017-04-29 DIAGNOSIS — I10 Essential (primary) hypertension: Secondary | ICD-10-CM | POA: Diagnosis not present

## 2017-04-29 DIAGNOSIS — A310 Pulmonary mycobacterial infection: Secondary | ICD-10-CM | POA: Diagnosis not present

## 2017-04-29 DIAGNOSIS — Z01818 Encounter for other preprocedural examination: Secondary | ICD-10-CM | POA: Diagnosis not present

## 2017-04-29 DIAGNOSIS — J439 Emphysema, unspecified: Secondary | ICD-10-CM | POA: Diagnosis not present

## 2017-05-12 DIAGNOSIS — H2512 Age-related nuclear cataract, left eye: Secondary | ICD-10-CM | POA: Diagnosis not present

## 2017-05-12 DIAGNOSIS — Z79899 Other long term (current) drug therapy: Secondary | ICD-10-CM | POA: Diagnosis not present

## 2017-05-12 DIAGNOSIS — J449 Chronic obstructive pulmonary disease, unspecified: Secondary | ICD-10-CM | POA: Diagnosis not present

## 2017-05-12 DIAGNOSIS — Z87891 Personal history of nicotine dependence: Secondary | ICD-10-CM | POA: Diagnosis not present

## 2017-05-12 DIAGNOSIS — Z86718 Personal history of other venous thrombosis and embolism: Secondary | ICD-10-CM | POA: Diagnosis not present

## 2017-05-12 DIAGNOSIS — D509 Iron deficiency anemia, unspecified: Secondary | ICD-10-CM | POA: Diagnosis not present

## 2017-05-12 DIAGNOSIS — I10 Essential (primary) hypertension: Secondary | ICD-10-CM | POA: Diagnosis not present

## 2017-05-12 DIAGNOSIS — E785 Hyperlipidemia, unspecified: Secondary | ICD-10-CM | POA: Diagnosis not present

## 2017-05-26 DIAGNOSIS — Z01818 Encounter for other preprocedural examination: Secondary | ICD-10-CM | POA: Diagnosis not present

## 2017-05-28 DIAGNOSIS — I5032 Chronic diastolic (congestive) heart failure: Secondary | ICD-10-CM | POA: Diagnosis not present

## 2017-05-28 DIAGNOSIS — E785 Hyperlipidemia, unspecified: Secondary | ICD-10-CM | POA: Diagnosis not present

## 2017-05-28 DIAGNOSIS — Z79899 Other long term (current) drug therapy: Secondary | ICD-10-CM | POA: Diagnosis not present

## 2017-06-02 DIAGNOSIS — H2511 Age-related nuclear cataract, right eye: Secondary | ICD-10-CM | POA: Diagnosis not present

## 2017-06-02 DIAGNOSIS — E785 Hyperlipidemia, unspecified: Secondary | ICD-10-CM | POA: Diagnosis not present

## 2017-06-02 DIAGNOSIS — Z86718 Personal history of other venous thrombosis and embolism: Secondary | ICD-10-CM | POA: Diagnosis not present

## 2017-06-02 DIAGNOSIS — Z7982 Long term (current) use of aspirin: Secondary | ICD-10-CM | POA: Diagnosis not present

## 2017-06-02 DIAGNOSIS — Z86711 Personal history of pulmonary embolism: Secondary | ICD-10-CM | POA: Diagnosis not present

## 2017-06-02 DIAGNOSIS — Z87891 Personal history of nicotine dependence: Secondary | ICD-10-CM | POA: Diagnosis not present

## 2017-06-02 DIAGNOSIS — Z79899 Other long term (current) drug therapy: Secondary | ICD-10-CM | POA: Diagnosis not present

## 2017-06-02 DIAGNOSIS — D509 Iron deficiency anemia, unspecified: Secondary | ICD-10-CM | POA: Diagnosis not present

## 2017-06-02 DIAGNOSIS — Z85118 Personal history of other malignant neoplasm of bronchus and lung: Secondary | ICD-10-CM | POA: Diagnosis not present

## 2017-06-02 DIAGNOSIS — I10 Essential (primary) hypertension: Secondary | ICD-10-CM | POA: Diagnosis not present

## 2017-06-02 DIAGNOSIS — J449 Chronic obstructive pulmonary disease, unspecified: Secondary | ICD-10-CM | POA: Diagnosis not present

## 2017-06-03 DIAGNOSIS — H1820 Unspecified corneal edema: Secondary | ICD-10-CM | POA: Diagnosis not present

## 2017-06-04 DIAGNOSIS — J449 Chronic obstructive pulmonary disease, unspecified: Secondary | ICD-10-CM | POA: Diagnosis not present

## 2017-06-04 DIAGNOSIS — I5032 Chronic diastolic (congestive) heart failure: Secondary | ICD-10-CM | POA: Diagnosis not present

## 2017-06-27 NOTE — Progress Notes (Signed)
[place]  Telephone:([phone]) [phone] [phone]) [phone]  ID: EDWAR COE OB: 01-20-1946  MR#: [id number]  AOZ#:[id number]  Patient Care Team: Asencion Noble, MD as PCP - General (Internal Medicine) Minna Merritts, MD (Cardiology)  CHIEF COMPLAINT:  Iron deficiency anemia.  INTERVAL HISTORY: Patient returns to clinic today for further evaluation and consideration of IV Feraheme. He continues to feel well and is asymptomatic. He does not complain of weakness or fatigue today. He has no neurologic complaints. He denies any fevers. He continues to have persistent shortness of breath. His appetite has improved and he denies any nausea, vomiting, or constipation. He has no urinary complaints. Patient offers no specific complaints today.  REVIEW OF SYSTEMS:   Review of Systems  Constitutional: Negative for fever, malaise/fatigue and weight loss.  Respiratory: Negative for cough, hemoptysis and shortness of breath.   Cardiovascular: Negative.  Negative for chest pain and leg swelling.  Gastrointestinal: Negative for abdominal pain, blood in stool, diarrhea and melena.  Genitourinary: Negative.   Musculoskeletal: Negative.   Skin: Negative.  Negative for rash.  Neurological: Negative.  Negative for weakness.  Psychiatric/Behavioral: Negative.  The patient is not nervous/anxious.     As per HPI. Otherwise, a complete review of systems is negative.  PAST MEDICAL HISTORY: Past Medical History:  Diagnosis Date  . Acute MI [facility])    pt denies  . Arthritis of lumbar spine ([place])   . Asthma   . Clotting disorder ([place])   . Collapsed lung    x3  . COPD (chronic obstructive pulmonary disease) ([place])   . Dyspnea   . History of benign thyroid tumor   . Lung cancer ([place])   . Mycobacterium avium complex ([place]) 06/24/2016  . Pneumonia   . Pneumothorax   . Small cell carcinoma of lung ([place])   . Spontaneous pneumothorax     PAST SURGICAL HISTORY: Past Surgical History:    Procedure Laterality Date  . APPENDECTOMY    . benign thryoid nodule resected    . CARDIAC CATHETERIZATION  2013   [facility]  . CHEST TUBE INSERTION    . COLONOSCOPY N/A 08/30/2015   Procedure: COLONOSCOPY;  Surgeon: Rogene Houston, MD;  [facility];  Service: Endoscopy;  Laterality: N/A;  1:45  . left upper lobectomy for small cell lung cancer    . VATS R thoracotomy      FAMILY HISTORY: Family History  Problem Relation Age of Onset  . Heart disease Mother   . Colon cancer Father   . Heart failure Brother     ADVANCED DIRECTIVES (Y/N):  N  HEALTH MAINTENANCE: Social History  Substance Use Topics  . Smoking status: Former Smoker    Packs/day: 3.00    Years: 30.00    Types: Cigars    Quit date: 03/02/1992  . Smokeless tobacco: Never Used     Comment: quit 22 yrs ago  . Alcohol use No     Colonoscopy:  PAP:  Bone density:  Lipid panel:  Allergies  Allergen Reactions  . Other Other (See Comments)    Hydromet cough syrup-causes GI upset  . Codeine Nausea And Vomiting    Current Outpatient Prescriptions  Medication Sig Dispense Refill  . acetaminophen (TYLENOL) 325 MG tablet Take 650 mg by mouth as needed.      Marland Kitchen aspirin 81 MG EC tablet Take 81 mg by mouth daily. Swallow whole.    Marland Kitchen atorvastatin (LIPITOR) 10 MG tablet TAKE 1 TABLET BY  MOUTH EVERY DAY 90 tablet 3  . diphenoxylate-atropine (LOMOTIL) 2.5-0.025 MG tablet     . Fluticasone-Salmeterol (ADVAIR DISKUS) 100-50 MCG/DOSE AEPB Inhale 1 puff into the lungs 2 (two) times daily. 60 each 12  . furosemide (LASIX) 20 MG tablet Take 2 tablets (40 mg total) by mouth daily. (Patient taking differently: Take 20 mg by mouth daily. ) 30 tablet   . guaiFENesin (MUCINEX) 600 MG 12 hr tablet Take 600 mg by mouth daily.     Marland Kitchen ketorolac (ACULAR) 0.5 % ophthalmic solution Place 1 drop into the right eye 3 (three) times daily.    . ondansetron (ZOFRAN) 4 MG tablet Take 1 tablet (4 mg total) by mouth every 6 (six) hours as  needed for nausea. 60 tablet 4  . potassium chloride (KLOR-CON) 20 MEQ packet Take 20 mEq by mouth daily.     . prednisoLONE acetate (PRED FORTE) 1 % ophthalmic suspension Place 1 drop into the right eye 4 (four) times daily.    . traMADol (ULTRAM) 50 MG tablet Take 1 tablet (50 mg total) by mouth every 6 (six) hours as needed. Takes 2 tablets 4 times daily as needed for pain. 20 tablet 0  . albuterol (PROVENTIL HFA;VENTOLIN HFA) 108 (90 Base) MCG/ACT inhaler Inhale 2 puffs into the lungs every 6 (six) hours as needed for wheezing or shortness of breath. (Patient not taking: Reported on 06/28/2017) 1 Inhaler 12   No current facility-administered medications for this visit.     OBJECTIVE: Vitals:   06/28/17 1424  BP: (!) 148/73  Pulse: 83  Resp: 18  Temp: (!) 97.3 F (36.3 C)     Body mass index is 23.23 kg/m.    ECOG FS:1 - Symptomatic but completely ambulatory  General: Well-developed, well-nourished, no acute distress. Eyes: Pink conjunctiva, anicteric sclera. Lungs: Clear to auscultation bilaterally. Heart: Regular rate and rhythm. No rubs, murmurs, or gallops. Abdomen: Soft, nontender, nondistended. No organomegaly noted, normoactive bowel sounds. Musculoskeletal: No edema, cyanosis, or clubbing. Neuro: Alert, answering all questions appropriately. Cranial nerves grossly intact. Skin: No rashes or petechiae noted. Psych: Normal affect.   LAB RESULTS:  Lab Results  Component Value Date   NA 134 (L) 08/12/2016   K 3.5 08/12/2016   CL 97 (L) 08/12/2016   CO2 25 08/12/2016   GLUCOSE 92 08/12/2016   BUN 9 08/12/2016   CREATININE 0.86 08/12/2016   CALCIUM 8.9 08/12/2016   PROT 6.1 (L) 08/12/2016   ALBUMIN 3.0 (L) 08/12/2016   AST 23 08/12/2016   ALT 30 08/12/2016   ALKPHOS 51 08/12/2016   BILITOT 0.9 08/12/2016   GFRNONAA >60 08/12/2016   GFRAA >60 08/12/2016    Lab Results  Component Value Date   WBC 12.9 (H) 06/28/2017   NEUTROABS 10.2 (H) 06/28/2017   HGB 11.3  (L) 06/28/2017   HCT 33.6 (L) 06/28/2017   MCV 85.9 06/28/2017   PLT 383 06/28/2017   Lab Results  Component Value Date   IRON 21 (L) 06/28/2017   TIBC 255 06/28/2017   IRONPCTSAT 8 (L) 06/28/2017   Lab Results  Component Value Date   FERRITIN 224 06/28/2017     STUDIES: No results found.  ASSESSMENT: Iron deficiency anemia.  PLAN:    1.  Iron deficiency anemia: Patient's hemoglobin and iron stores Have trended down slightly, therefore will proceed with 510 mg IV Feraheme today. He does not require second infusion. Previously, the remainder of his laboratory work was either negative or within normal limits.  He last received IV iron in February 2018. Return to clinic in 4 months with repeat laboratory work and further evaluation. 2. Shortness of breath/MAC: Resolved.  3. Cardiac disease: Continue follow-up and evaluation with cardiology as needed.   Approximately 30 minutes was spent in discussion of which greater than 50% was consultation.  Patient expressed understanding and was in agreement with this plan. He also understands that He can call clinic at any time with any questions, concerns, or complaints.    Lloyd Huger, MD   06/28/2017 4:23 PM

## 2017-06-28 ENCOUNTER — Inpatient Hospital Stay (HOSPITAL_BASED_OUTPATIENT_CLINIC_OR_DEPARTMENT_OTHER): Payer: Medicare Other | Admitting: Oncology

## 2017-06-28 ENCOUNTER — Inpatient Hospital Stay: Payer: Medicare Other

## 2017-06-28 ENCOUNTER — Inpatient Hospital Stay: Payer: Medicare Other | Attending: Oncology

## 2017-06-28 VITALS — BP 135/75 | HR 71 | Temp 97.9°F | Resp 16

## 2017-06-28 VITALS — BP 148/73 | HR 83 | Temp 97.3°F | Resp 18 | Wt 148.3 lb

## 2017-06-28 DIAGNOSIS — Z87891 Personal history of nicotine dependence: Secondary | ICD-10-CM | POA: Diagnosis not present

## 2017-06-28 DIAGNOSIS — I252 Old myocardial infarction: Secondary | ICD-10-CM | POA: Diagnosis not present

## 2017-06-28 DIAGNOSIS — J449 Chronic obstructive pulmonary disease, unspecified: Secondary | ICD-10-CM | POA: Insufficient documentation

## 2017-06-28 DIAGNOSIS — Z8701 Personal history of pneumonia (recurrent): Secondary | ICD-10-CM

## 2017-06-28 DIAGNOSIS — M129 Arthropathy, unspecified: Secondary | ICD-10-CM | POA: Diagnosis not present

## 2017-06-28 DIAGNOSIS — D689 Coagulation defect, unspecified: Secondary | ICD-10-CM

## 2017-06-28 DIAGNOSIS — Z7982 Long term (current) use of aspirin: Secondary | ICD-10-CM | POA: Diagnosis not present

## 2017-06-28 DIAGNOSIS — Z85118 Personal history of other malignant neoplasm of bronchus and lung: Secondary | ICD-10-CM | POA: Insufficient documentation

## 2017-06-28 DIAGNOSIS — Z79899 Other long term (current) drug therapy: Secondary | ICD-10-CM | POA: Diagnosis not present

## 2017-06-28 DIAGNOSIS — D509 Iron deficiency anemia, unspecified: Secondary | ICD-10-CM | POA: Diagnosis not present

## 2017-06-28 DIAGNOSIS — R0602 Shortness of breath: Secondary | ICD-10-CM | POA: Diagnosis not present

## 2017-06-28 DIAGNOSIS — D500 Iron deficiency anemia secondary to blood loss (chronic): Secondary | ICD-10-CM

## 2017-06-28 DIAGNOSIS — Z800 Family history of malignant neoplasm of digestive organs: Secondary | ICD-10-CM | POA: Insufficient documentation

## 2017-06-28 LAB — CBC WITH DIFFERENTIAL/PLATELET
BASOS PCT: 1 %
Basophils Absolute: 0.1 10*3/uL (ref 0–0.1)
Eosinophils Absolute: 0.6 10*3/uL (ref 0–0.7)
Eosinophils Relative: 4 %
HEMATOCRIT: 33.6 % — AB (ref 40.0–52.0)
HEMOGLOBIN: 11.3 g/dL — AB (ref 13.0–18.0)
LYMPHS ABS: 0.9 10*3/uL — AB (ref 1.0–3.6)
LYMPHS PCT: 7 %
MCH: 28.9 pg (ref 26.0–34.0)
MCHC: 33.7 g/dL (ref 32.0–36.0)
MCV: 85.9 fL (ref 80.0–100.0)
MONOS PCT: 9 %
Monocytes Absolute: 1.2 10*3/uL — ABNORMAL HIGH (ref 0.2–1.0)
NEUTROS ABS: 10.2 10*3/uL — AB (ref 1.4–6.5)
NEUTROS PCT: 79 %
Platelets: 383 10*3/uL (ref 150–440)
RBC: 3.91 MIL/uL — ABNORMAL LOW (ref 4.40–5.90)
RDW: 14.1 % (ref 11.5–14.5)
WBC: 12.9 10*3/uL — ABNORMAL HIGH (ref 3.8–10.6)

## 2017-06-28 LAB — FERRITIN: Ferritin: 224 ng/mL (ref 24–336)

## 2017-06-28 LAB — IRON AND TIBC
Iron: 21 ug/dL — ABNORMAL LOW (ref 45–182)
Saturation Ratios: 8 % — ABNORMAL LOW (ref 17.9–39.5)
TIBC: 255 ug/dL (ref 250–450)
UIBC: 234 ug/dL

## 2017-06-28 MED ORDER — SODIUM CHLORIDE 0.9 % IV SOLN
510.0000 mg | Freq: Once | INTRAVENOUS | Status: AC
  Administered 2017-06-28: 510 mg via INTRAVENOUS
  Filled 2017-06-28: qty 17

## 2017-06-28 NOTE — Progress Notes (Signed)
Patient denies any concerns today.  

## 2017-07-02 DIAGNOSIS — Z23 Encounter for immunization: Secondary | ICD-10-CM | POA: Diagnosis not present

## 2017-08-09 DIAGNOSIS — Z961 Presence of intraocular lens: Secondary | ICD-10-CM | POA: Diagnosis not present

## 2017-08-09 DIAGNOSIS — H1820 Unspecified corneal edema: Secondary | ICD-10-CM | POA: Diagnosis not present

## 2017-09-10 DIAGNOSIS — E785 Hyperlipidemia, unspecified: Secondary | ICD-10-CM | POA: Diagnosis not present

## 2017-09-10 DIAGNOSIS — C3490 Malignant neoplasm of unspecified part of unspecified bronchus or lung: Secondary | ICD-10-CM | POA: Diagnosis not present

## 2017-09-10 DIAGNOSIS — Z79899 Other long term (current) drug therapy: Secondary | ICD-10-CM | POA: Diagnosis not present

## 2017-09-10 DIAGNOSIS — I5032 Chronic diastolic (congestive) heart failure: Secondary | ICD-10-CM | POA: Diagnosis not present

## 2017-09-10 DIAGNOSIS — I2510 Atherosclerotic heart disease of native coronary artery without angina pectoris: Secondary | ICD-10-CM | POA: Diagnosis not present

## 2017-09-17 DIAGNOSIS — J449 Chronic obstructive pulmonary disease, unspecified: Secondary | ICD-10-CM | POA: Diagnosis not present

## 2017-09-17 DIAGNOSIS — I5032 Chronic diastolic (congestive) heart failure: Secondary | ICD-10-CM | POA: Diagnosis not present

## 2017-09-29 ENCOUNTER — Ambulatory Visit (INDEPENDENT_AMBULATORY_CARE_PROVIDER_SITE_OTHER): Payer: Medicare Other | Admitting: Pulmonary Disease

## 2017-09-29 ENCOUNTER — Encounter: Payer: Self-pay | Admitting: Pulmonary Disease

## 2017-09-29 VITALS — BP 130/72 | HR 91 | Ht 67.0 in | Wt 150.0 lb

## 2017-09-29 DIAGNOSIS — Z85118 Personal history of other malignant neoplasm of bronchus and lung: Secondary | ICD-10-CM | POA: Diagnosis not present

## 2017-09-29 DIAGNOSIS — J479 Bronchiectasis, uncomplicated: Secondary | ICD-10-CM | POA: Diagnosis not present

## 2017-09-29 DIAGNOSIS — A310 Pulmonary mycobacterial infection: Secondary | ICD-10-CM

## 2017-09-29 DIAGNOSIS — J449 Chronic obstructive pulmonary disease, unspecified: Secondary | ICD-10-CM | POA: Diagnosis not present

## 2017-09-29 DIAGNOSIS — Z87891 Personal history of nicotine dependence: Secondary | ICD-10-CM | POA: Diagnosis not present

## 2017-09-29 NOTE — Progress Notes (Signed)
PULMONARY OFFICE FOLLOW UP  PROBLEMS: 1) Severe COPD 2) Pulmonary MAC - he has been intolerant to attempts @ Rx 3) Bronchiectasis 4) H/O lung cancer - S/P LLL resection 2000 5) Former smoker - quit 1995  INTERVAL HISTORY: No major events  SUBJ: This is a routine reevaluation.  There have been no major events since last visit 02/19/17.  He remains on Advair and uses albuterol rescue inhaler 0-4 times per day.. Continues to have mild chronic mucus production which is unchanged to slightly worse.. Denies CP, fever, purulent sputum, hemoptysis, LE edema and calf tenderness.  He is using a chest percussion vest which he states helps "a whole lot".  OBJ: Vitals:   09/29/17 1447 09/29/17 1452  BP:  130/72  Pulse:  91  SpO2:  99%  Weight: 68 kg (150 lb)   Height: _0  (1.702 m)   RA  WDWN, no overt distress HEENT WNL No JVD L > R basilar crackles, no wheezes Reg, no M NABS, soft No C/C/E   CURRENT PULMONARY THERAPIES Advair PRN albuterol Chest percussion  DATA: BMP Latest Ref Rng & Units 08/12/2016 08/04/2016 07/29/2016  Glucose 65 - 99 mg/dL 92 107(H) 103(H)  BUN 6 - 20 mg/dL 9 15 21(H)  Creatinine 0.61 - 1.24 mg/dL 0.86 0.81 1.01  Sodium 135 - 145 mmol/L 134(L) 136 141  Potassium 3.5 - 5.1 mmol/L 3.5 2.9(L) 3.3(L)  Chloride 101 - 111 mmol/L 97(L) 94(L) 100(L)  CO2 22 - 32 mmol/L _1 Calcium 8.9 - 10.3 mg/dL 8.9 8.6(L) 8.9    CBC Latest Ref Rng & Units 06/28/2017 03/24/2017 12/10/2016  WBC 3.8 - 10.6 K/uL 12.9(H) 12.3(H) 10.5  Hemoglobin 13.0 - 18.0 g/dL 11.3(L) 11.5(L) 10.6(L)  Hematocrit 40.0 - 52.0 % 33.6(L) 33.5(L) 31.7(L)  Platelets 150 - 440 K/uL 383 387 374    CXR: NNF  IMPRESSION: 1) Severe COPD 2) S/P LLL resection for lung cancer 2000 3) former smoker 4) severe bronchiectasis due to pulmonary MAC   PLAN: 1) Cont Advair and PRN albuterol  2) Continue chest percussion -he is using this compliantly and is benefiting significantly from it 3) ROV 6  months with CXR prior to that visit   Merton Border, MD PCCM service Mobile [phone] Pager [phone] 09/29/2017 4:40 PM

## 2017-09-29 NOTE — Patient Instructions (Signed)
Cont Advair and albuterol as needed Continue chest percussion Follow-up in 5 to 6 months with CXR prior to that visit

## 2017-10-04 DIAGNOSIS — J439 Emphysema, unspecified: Secondary | ICD-10-CM | POA: Diagnosis not present

## 2017-10-04 DIAGNOSIS — I10 Essential (primary) hypertension: Secondary | ICD-10-CM | POA: Diagnosis not present

## 2017-10-04 DIAGNOSIS — Z01818 Encounter for other preprocedural examination: Secondary | ICD-10-CM | POA: Diagnosis not present

## 2017-10-04 DIAGNOSIS — E785 Hyperlipidemia, unspecified: Secondary | ICD-10-CM | POA: Diagnosis not present

## 2017-10-04 DIAGNOSIS — C3432 Malignant neoplasm of lower lobe, left bronchus or lung: Secondary | ICD-10-CM | POA: Diagnosis not present

## 2017-10-19 DIAGNOSIS — Z87891 Personal history of nicotine dependence: Secondary | ICD-10-CM | POA: Diagnosis not present

## 2017-10-19 DIAGNOSIS — H1820 Unspecified corneal edema: Secondary | ICD-10-CM | POA: Diagnosis not present

## 2017-10-19 DIAGNOSIS — J439 Emphysema, unspecified: Secondary | ICD-10-CM | POA: Diagnosis not present

## 2017-10-19 DIAGNOSIS — T86841 Corneal transplant failure: Secondary | ICD-10-CM | POA: Diagnosis not present

## 2017-10-19 DIAGNOSIS — I10 Essential (primary) hypertension: Secondary | ICD-10-CM | POA: Diagnosis not present

## 2017-10-19 DIAGNOSIS — E785 Hyperlipidemia, unspecified: Secondary | ICD-10-CM | POA: Diagnosis not present

## 2017-11-01 ENCOUNTER — Other Ambulatory Visit: Payer: Medicare Other

## 2017-11-01 ENCOUNTER — Ambulatory Visit: Payer: Medicare Other | Admitting: Oncology

## 2017-11-01 ENCOUNTER — Ambulatory Visit: Payer: Medicare Other

## 2017-11-09 DIAGNOSIS — Z885 Allergy status to narcotic agent status: Secondary | ICD-10-CM | POA: Diagnosis not present

## 2017-11-09 DIAGNOSIS — Z86718 Personal history of other venous thrombosis and embolism: Secondary | ICD-10-CM | POA: Diagnosis not present

## 2017-11-09 DIAGNOSIS — H43312 Vitreous membranes and strands, left eye: Secondary | ICD-10-CM | POA: Diagnosis not present

## 2017-11-09 DIAGNOSIS — Z86711 Personal history of pulmonary embolism: Secondary | ICD-10-CM | POA: Diagnosis not present

## 2017-11-09 DIAGNOSIS — I10 Essential (primary) hypertension: Secondary | ICD-10-CM | POA: Diagnosis not present

## 2017-11-09 DIAGNOSIS — Z881 Allergy status to other antibiotic agents status: Secondary | ICD-10-CM | POA: Diagnosis not present

## 2017-11-09 DIAGNOSIS — E785 Hyperlipidemia, unspecified: Secondary | ICD-10-CM | POA: Diagnosis not present

## 2017-11-09 DIAGNOSIS — Z7982 Long term (current) use of aspirin: Secondary | ICD-10-CM | POA: Diagnosis not present

## 2017-11-09 DIAGNOSIS — T8529XA Other mechanical complication of intraocular lens, initial encounter: Secondary | ICD-10-CM | POA: Diagnosis not present

## 2017-11-09 DIAGNOSIS — D509 Iron deficiency anemia, unspecified: Secondary | ICD-10-CM | POA: Diagnosis not present

## 2017-11-09 DIAGNOSIS — H1820 Unspecified corneal edema: Secondary | ICD-10-CM | POA: Diagnosis not present

## 2017-11-09 DIAGNOSIS — Z85118 Personal history of other malignant neoplasm of bronchus and lung: Secondary | ICD-10-CM | POA: Diagnosis not present

## 2017-11-09 DIAGNOSIS — Z87891 Personal history of nicotine dependence: Secondary | ICD-10-CM | POA: Diagnosis not present

## 2017-11-09 DIAGNOSIS — Z79891 Long term (current) use of opiate analgesic: Secondary | ICD-10-CM | POA: Diagnosis not present

## 2017-11-09 DIAGNOSIS — H2702 Aphakia, left eye: Secondary | ICD-10-CM | POA: Diagnosis not present

## 2017-11-09 DIAGNOSIS — Z7951 Long term (current) use of inhaled steroids: Secondary | ICD-10-CM | POA: Diagnosis not present

## 2017-11-09 DIAGNOSIS — Z79899 Other long term (current) drug therapy: Secondary | ICD-10-CM | POA: Diagnosis not present

## 2017-11-09 DIAGNOSIS — J449 Chronic obstructive pulmonary disease, unspecified: Secondary | ICD-10-CM | POA: Diagnosis not present

## 2017-11-09 DIAGNOSIS — H4302 Vitreous prolapse, left eye: Secondary | ICD-10-CM | POA: Diagnosis not present

## 2017-11-14 NOTE — Progress Notes (Signed)
[place]  Telephone:(336) [phone] Fax:(336) [phone]  ID: [name] OB: 1946/01/18  MR#: [id number]  AOZ#:[id number]  Patient Care Team: Asencion Noble, MD as PCP - General (Internal Medicine) Minna Merritts, MD (Cardiology)  CHIEF COMPLAINT:  Iron deficiency anemia.  INTERVAL HISTORY: Patient returns to clinic today for further evaluation and consideration of IV Feraheme. He continues to feel well and is asymptomatic. He does not complain of weakness or fatigue today. He has no neurologic complaints. He denies any fevers. He denies any chest pain or shortness of breath.  He has a good appetite and denies weight loss.  He has no nausea, vomiting, or constipation.  He denies any melena or hematochezia.  He has no urinary complaints. Patient offers no specific complaints today.  REVIEW OF SYSTEMS:   Review of Systems  Constitutional: Negative for fever, malaise/fatigue and weight loss.  Respiratory: Negative for cough, hemoptysis and shortness of breath.   Cardiovascular: Negative.  Negative for chest pain and leg swelling.  Gastrointestinal: Negative.  Negative for abdominal pain, blood in stool, diarrhea and melena.  Genitourinary: Negative.  Negative for hematuria.  Musculoskeletal: Negative.   Skin: Negative.  Negative for rash.  Neurological: Negative.  Negative for weakness.  Psychiatric/Behavioral: Negative.  The patient is not nervous/anxious.     As per HPI. Otherwise, a complete review of systems is negative.  PAST MEDICAL HISTORY: Past Medical History:  Diagnosis Date  . Acute MI [facility])    pt denies  . Arthritis of lumbar spine   . Asthma   . Clotting disorder ([place])   . Collapsed lung    x3  . COPD (chronic obstructive pulmonary disease) ([place])   . Dyspnea   . History of benign thyroid tumor   . Lung cancer ([place])   . Mycobacterium avium complex ([place]) 06/24/2016  . Pneumonia   . Pneumothorax   . Small cell carcinoma of lung ([place])   .  Spontaneous pneumothorax     PAST SURGICAL HISTORY: Past Surgical History:  Procedure Laterality Date  . APPENDECTOMY    . benign thryoid nodule resected    . CARDIAC CATHETERIZATION  2013   [facility]  . CHEST TUBE INSERTION    . COLONOSCOPY N/A 08/30/2015   Procedure: COLONOSCOPY;  Surgeon: Rogene Houston, MD;  Location: [facility];  Service: Endoscopy;  Laterality: N/A;  1:45  . left upper lobectomy for small cell lung cancer    . VATS R thoracotomy      FAMILY HISTORY: Family History  Problem Relation Age of Onset  . Heart disease Mother   . Colon cancer Father   . Heart failure Brother     ADVANCED DIRECTIVES (Y/N):  N  HEALTH MAINTENANCE: Social History   Tobacco Use  . Smoking status: Former Smoker    Packs/day: 3.00    Years: 30.00    Pack years: 90.00    Types: Cigars    Last attempt to quit: 03/02/1992    Years since quitting: 25.7  . Smokeless tobacco: Never Used  . Tobacco comment: quit 22 yrs ago  Substance Use Topics  . Alcohol use: No    Alcohol/week: 0.0 oz  . Drug use: No     Colonoscopy:  PAP:  Bone density:  Lipid panel:  Allergies  Allergen Reactions  . Neomy-Bacit-Polymyx-Pramoxine Anaphylaxis  . Other Other (See Comments)    Hydromet cough syrup-causes GI upset  . Codeine Nausea And Vomiting    Current Outpatient Medications  Medication Sig Dispense Refill  . aspirin 81 MG EC tablet Take 81 mg by mouth daily. Swallow whole.    Marland Kitchen atorvastatin (LIPITOR) 10 MG tablet TAKE 1 TABLET BY MOUTH EVERY DAY 90 tablet 3  . Fluticasone-Salmeterol (ADVAIR DISKUS) 100-50 MCG/DOSE AEPB Inhale 1 puff into the lungs 2 (two) times daily. 60 each 12  . furosemide (LASIX) 20 MG tablet Take 2 tablets (40 mg total) by mouth daily. (Patient taking differently: Take 20 mg by mouth daily. ) 30 tablet   . guaiFENesin (MUCINEX) 600 MG 12 hr tablet Take 600 mg by mouth daily.     . potassium chloride (KLOR-CON) 20 MEQ packet Take 20 mEq by mouth daily.     .  prednisoLONE acetate (PRED FORTE) 1 % ophthalmic suspension Administer 1 drop into the left eye Four (4) times a day.    . traMADol (ULTRAM) 50 MG tablet Take 1 tablet (50 mg total) by mouth every 6 (six) hours as needed. Takes 2 tablets 4 times daily as needed for pain. 20 tablet 0  . acetaminophen (TYLENOL) 325 MG tablet Take 650 mg by mouth as needed.      Marland Kitchen albuterol (PROVENTIL HFA;VENTOLIN HFA) 108 (90 Base) MCG/ACT inhaler Inhale 2 puffs into the lungs every 6 (six) hours as needed for wheezing or shortness of breath. (Patient not taking: Reported on 11/16/2017) 1 Inhaler 12  . ondansetron (ZOFRAN) 4 MG tablet Take 1 tablet (4 mg total) by mouth every 6 (six) hours as needed for nausea. (Patient not taking: Reported on 11/16/2017) 60 tablet 4   No current facility-administered medications for this visit.     OBJECTIVE: Vitals:   11/16/17 1419  BP: (!) 152/86  Pulse: 91  Resp: 18  Temp: (!) 96.9 F (36.1 C)     Body mass index is 23.79 kg/m.    ECOG FS:0 - Asymptomatic  General: Well-developed, well-nourished, no acute distress. Eyes: Pink conjunctiva, anicteric sclera. Lungs: Clear to auscultation bilaterally. Heart: Regular rate and rhythm. No rubs, murmurs, or gallops. Abdomen: Soft, nontender, nondistended. No organomegaly noted, normoactive bowel sounds. Musculoskeletal: No edema, cyanosis, or clubbing. Neuro: Alert, answering all questions appropriately. Cranial nerves grossly intact. Skin: No rashes or petechiae noted. Psych: Normal affect.   LAB RESULTS:  Lab Results  Component Value Date   NA 134 (L) 08/12/2016   K 3.5 08/12/2016   CL 97 (L) 08/12/2016   CO2 25 08/12/2016   GLUCOSE 92 08/12/2016   BUN 9 08/12/2016   CREATININE 0.86 08/12/2016   CALCIUM 8.9 08/12/2016   PROT 6.1 (L) 08/12/2016   ALBUMIN 3.0 (L) 08/12/2016   AST 23 08/12/2016   ALT 30 08/12/2016   ALKPHOS 51 08/12/2016   BILITOT 0.9 08/12/2016   GFRNONAA >60 08/12/2016   GFRAA >60  08/12/2016    Lab Results  Component Value Date   WBC 13.5 (H) 11/16/2017   NEUTROABS 11.5 (H) 11/16/2017   HGB 12.0 (L) 11/16/2017   HCT 36.2 (L) 11/16/2017   MCV 88.8 11/16/2017   PLT 398 11/16/2017   Lab Results  Component Value Date   IRON 21 (L) 06/28/2017   TIBC 255 06/28/2017   IRONPCTSAT 8 (L) 06/28/2017   Lab Results  Component Value Date   FERRITIN 224 06/28/2017     STUDIES: No results found.  ASSESSMENT: Iron deficiency anemia.  PLAN:    1.  Iron deficiency anemia: Patient's hemoglobin has improved and is now 12.0.  His iron stores are pending at  time of dictation.  Previously, the remainder of his blood work was either negative or within normal limits.  He does not require additional IV Feraheme today.  Patient last received treatment on June 28, 2017.  Return to clinic in 4 months with repeat laboratory work and further evaluation.   2. Shortness of breath/MAC: Resolved.  3. Cardiac disease: Continue follow-up and evaluation with cardiology as needed.   Approximately 20 minutes was spent in discussion of which greater than 50% was consultation.  Patient expressed understanding and was in agreement with this plan. He also understands that He can call clinic at any time with any questions, concerns, or complaints.    Lloyd Huger, MD   11/16/2017 3:03 PM

## 2017-11-16 ENCOUNTER — Inpatient Hospital Stay: Payer: Medicare Other | Attending: Oncology

## 2017-11-16 ENCOUNTER — Other Ambulatory Visit: Payer: Self-pay

## 2017-11-16 ENCOUNTER — Inpatient Hospital Stay: Payer: Medicare Other

## 2017-11-16 ENCOUNTER — Inpatient Hospital Stay (HOSPITAL_BASED_OUTPATIENT_CLINIC_OR_DEPARTMENT_OTHER): Payer: Medicare Other | Admitting: Oncology

## 2017-11-16 VITALS — BP 152/86 | HR 91 | Temp 96.9°F | Resp 18 | Wt 151.9 lb

## 2017-11-16 DIAGNOSIS — D509 Iron deficiency anemia, unspecified: Secondary | ICD-10-CM

## 2017-11-16 DIAGNOSIS — Z79899 Other long term (current) drug therapy: Secondary | ICD-10-CM

## 2017-11-16 DIAGNOSIS — J449 Chronic obstructive pulmonary disease, unspecified: Secondary | ICD-10-CM

## 2017-11-16 DIAGNOSIS — Z87891 Personal history of nicotine dependence: Secondary | ICD-10-CM

## 2017-11-16 DIAGNOSIS — Z7982 Long term (current) use of aspirin: Secondary | ICD-10-CM | POA: Diagnosis not present

## 2017-11-16 DIAGNOSIS — Z8701 Personal history of pneumonia (recurrent): Secondary | ICD-10-CM

## 2017-11-16 DIAGNOSIS — R0602 Shortness of breath: Secondary | ICD-10-CM

## 2017-11-16 DIAGNOSIS — I252 Old myocardial infarction: Secondary | ICD-10-CM | POA: Insufficient documentation

## 2017-11-16 LAB — CBC WITH DIFFERENTIAL/PLATELET
BASOS PCT: 1 %
Basophils Absolute: 0.1 10*3/uL (ref 0–0.1)
EOS ABS: 0.3 10*3/uL (ref 0–0.7)
EOS PCT: 3 %
HCT: 36.2 % — ABNORMAL LOW (ref 40.0–52.0)
HEMOGLOBIN: 12 g/dL — AB (ref 13.0–18.0)
LYMPHS ABS: 0.7 10*3/uL — AB (ref 1.0–3.6)
Lymphocytes Relative: 5 %
MCH: 29.5 pg (ref 26.0–34.0)
MCHC: 33.2 g/dL (ref 32.0–36.0)
MCV: 88.8 fL (ref 80.0–100.0)
Monocytes Absolute: 0.9 10*3/uL (ref 0.2–1.0)
Monocytes Relative: 7 %
NEUTROS PCT: 84 %
Neutro Abs: 11.5 10*3/uL — ABNORMAL HIGH (ref 1.4–6.5)
PLATELETS: 398 10*3/uL (ref 150–440)
RBC: 4.08 MIL/uL — ABNORMAL LOW (ref 4.40–5.90)
RDW: 13.9 % (ref 11.5–14.5)
WBC: 13.5 10*3/uL — ABNORMAL HIGH (ref 3.8–10.6)

## 2017-11-16 LAB — IRON AND TIBC
IRON: 28 ug/dL — AB (ref 45–182)
Saturation Ratios: 11 % — ABNORMAL LOW (ref 17.9–39.5)
TIBC: 264 ug/dL (ref 250–450)
UIBC: 236 ug/dL

## 2017-11-16 LAB — FERRITIN: FERRITIN: 376 ng/mL — AB (ref 24–336)

## 2017-11-16 NOTE — Progress Notes (Signed)
Here for follow up. 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

## 2017-12-08 ENCOUNTER — Telehealth: Payer: Self-pay | Admitting: Oncology

## 2017-12-08 NOTE — Telephone Encounter (Signed)
Left pt VM to call office back to r\s June 19th appt.

## 2017-12-13 DIAGNOSIS — E785 Hyperlipidemia, unspecified: Secondary | ICD-10-CM | POA: Diagnosis not present

## 2017-12-13 DIAGNOSIS — Z79899 Other long term (current) drug therapy: Secondary | ICD-10-CM | POA: Diagnosis not present

## 2017-12-13 DIAGNOSIS — J449 Chronic obstructive pulmonary disease, unspecified: Secondary | ICD-10-CM | POA: Diagnosis not present

## 2017-12-13 DIAGNOSIS — I5032 Chronic diastolic (congestive) heart failure: Secondary | ICD-10-CM | POA: Diagnosis not present

## 2017-12-20 DIAGNOSIS — I5032 Chronic diastolic (congestive) heart failure: Secondary | ICD-10-CM | POA: Diagnosis not present

## 2017-12-20 DIAGNOSIS — M5136 Other intervertebral disc degeneration, lumbar region: Secondary | ICD-10-CM | POA: Diagnosis not present

## 2017-12-20 DIAGNOSIS — Z6825 Body mass index (BMI) 25.0-25.9, adult: Secondary | ICD-10-CM | POA: Diagnosis not present

## 2017-12-25 IMAGING — CR DG CHEST 2V
2 series · 2 of 2 positions shown · non-contrast
Comparison: 06/10/2016

CLINICAL DATA: Cough

EXAM:
CHEST  2 VIEW

[chest pa]
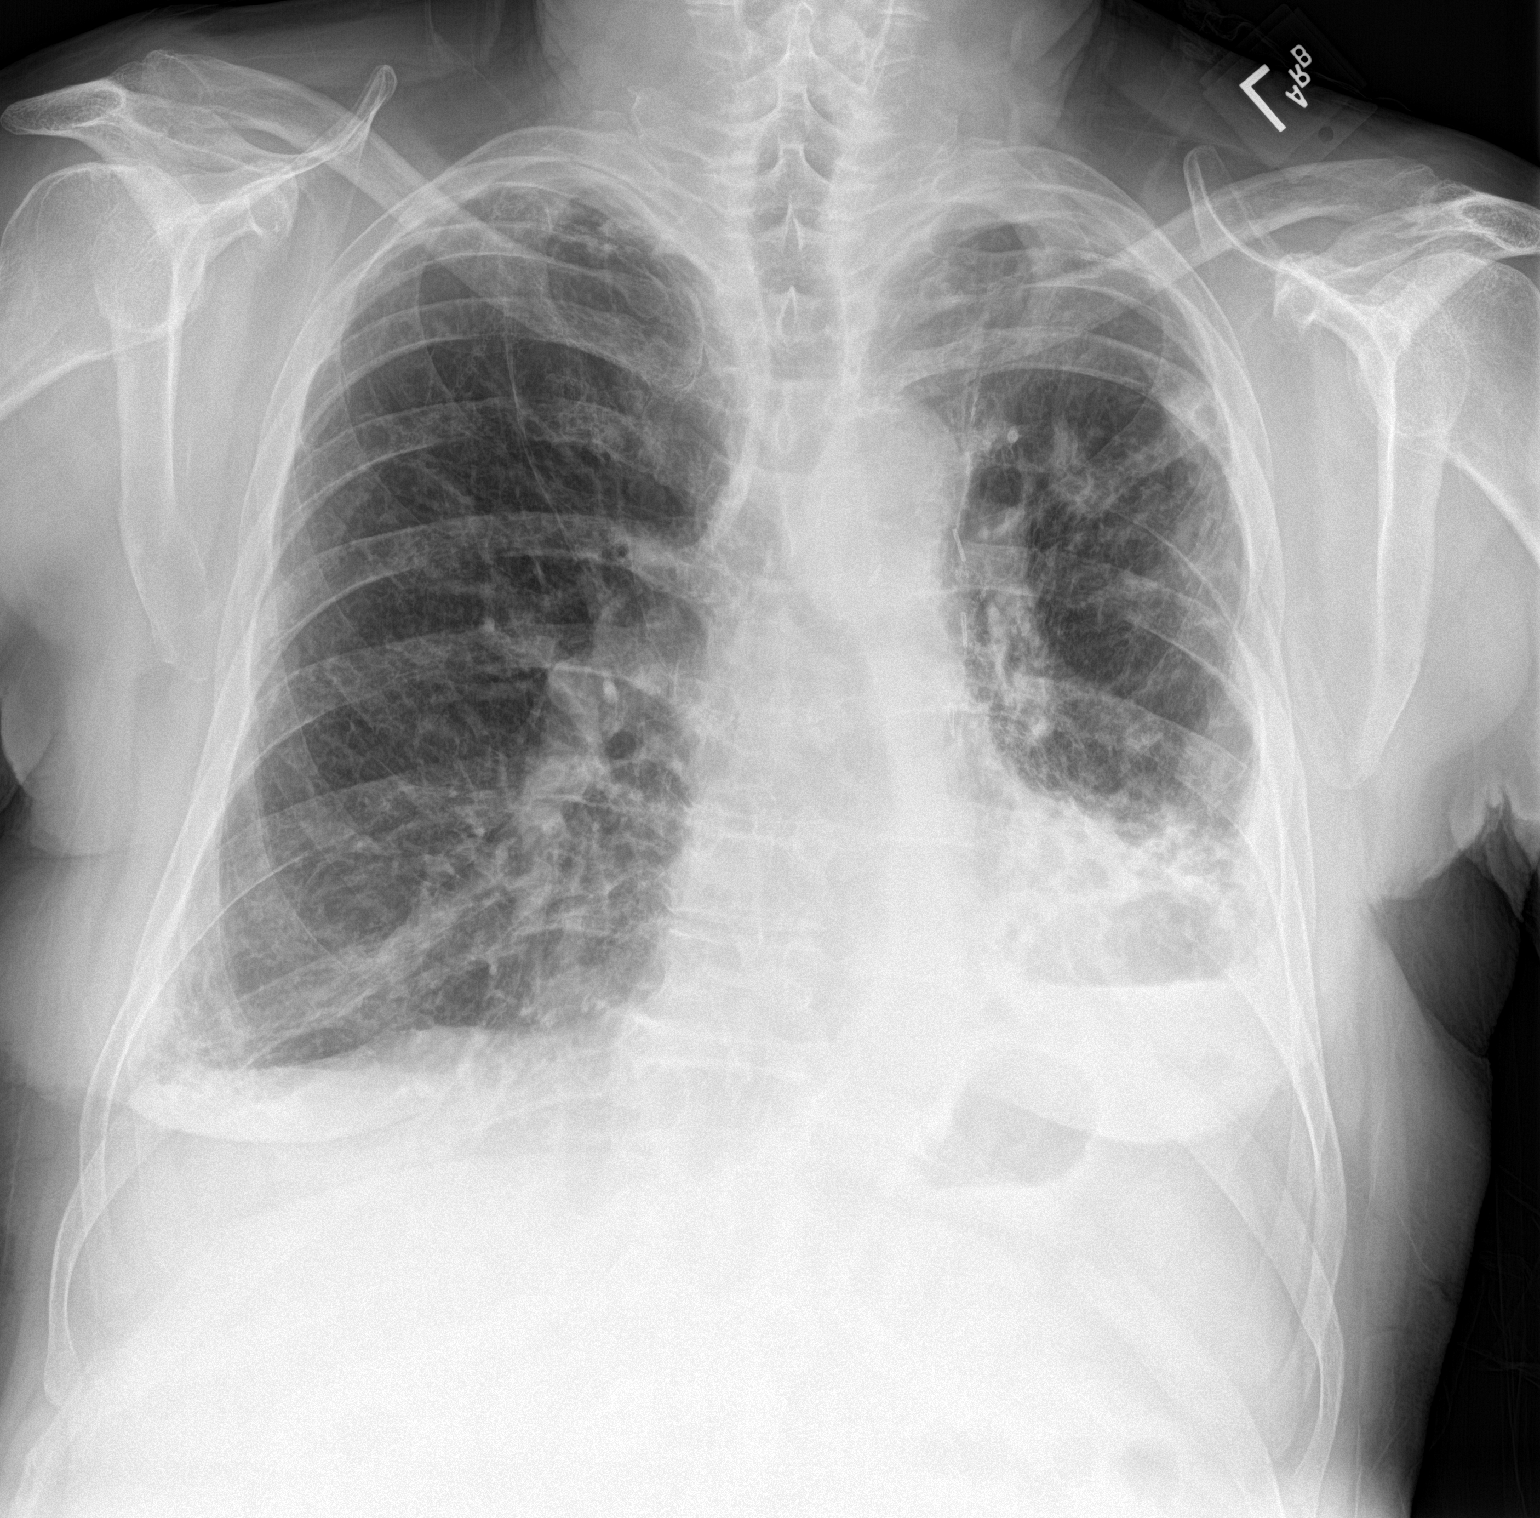

[chest lat]
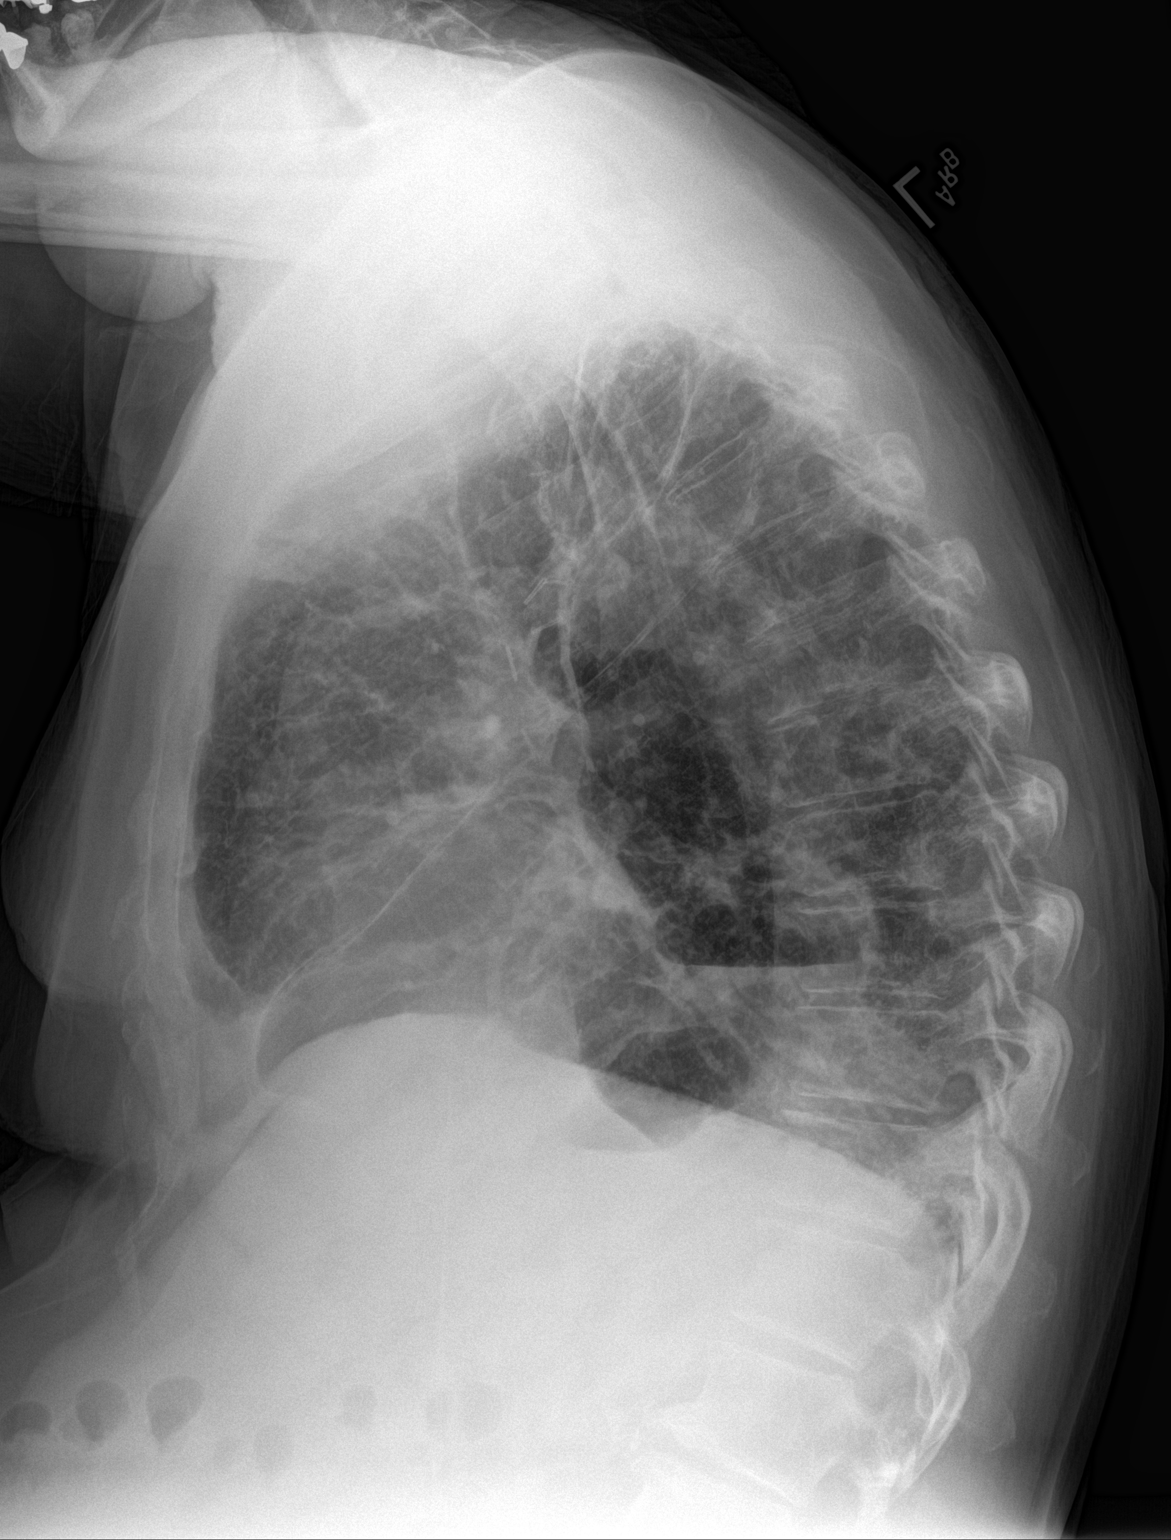

[2 of 2 positions shown; findings below may reference images not displayed]

FINDINGS: Chronic lung disease including emphysema and scattered
bronchiectasis/scarring. Chronic opacity at the left base with fluid
level. No progressive airspace opacity, pneumothorax, or pleural
effusion. Normal heart size and mediastinal contours. Postoperative
left chest.
IMPRESSION: 1. Stable compared to prior.
2. Chronic lung disease including emphysema and bronchiectasis with
known fluid-filled cavity at the left base.

## 2018-01-18 IMAGING — CR DG CHEST 2V
1 series · 2 of 2 positions shown · non-contrast
Comparison: 07/20/2016

CLINICAL DATA: Pneumonia.  Shortness of breath, weakness.

EXAM:
CHEST  2 VIEW

[Series 1: dg chest 2 view · 0.14mm/px · 2 of 2 slices shown]
[im 1/2]
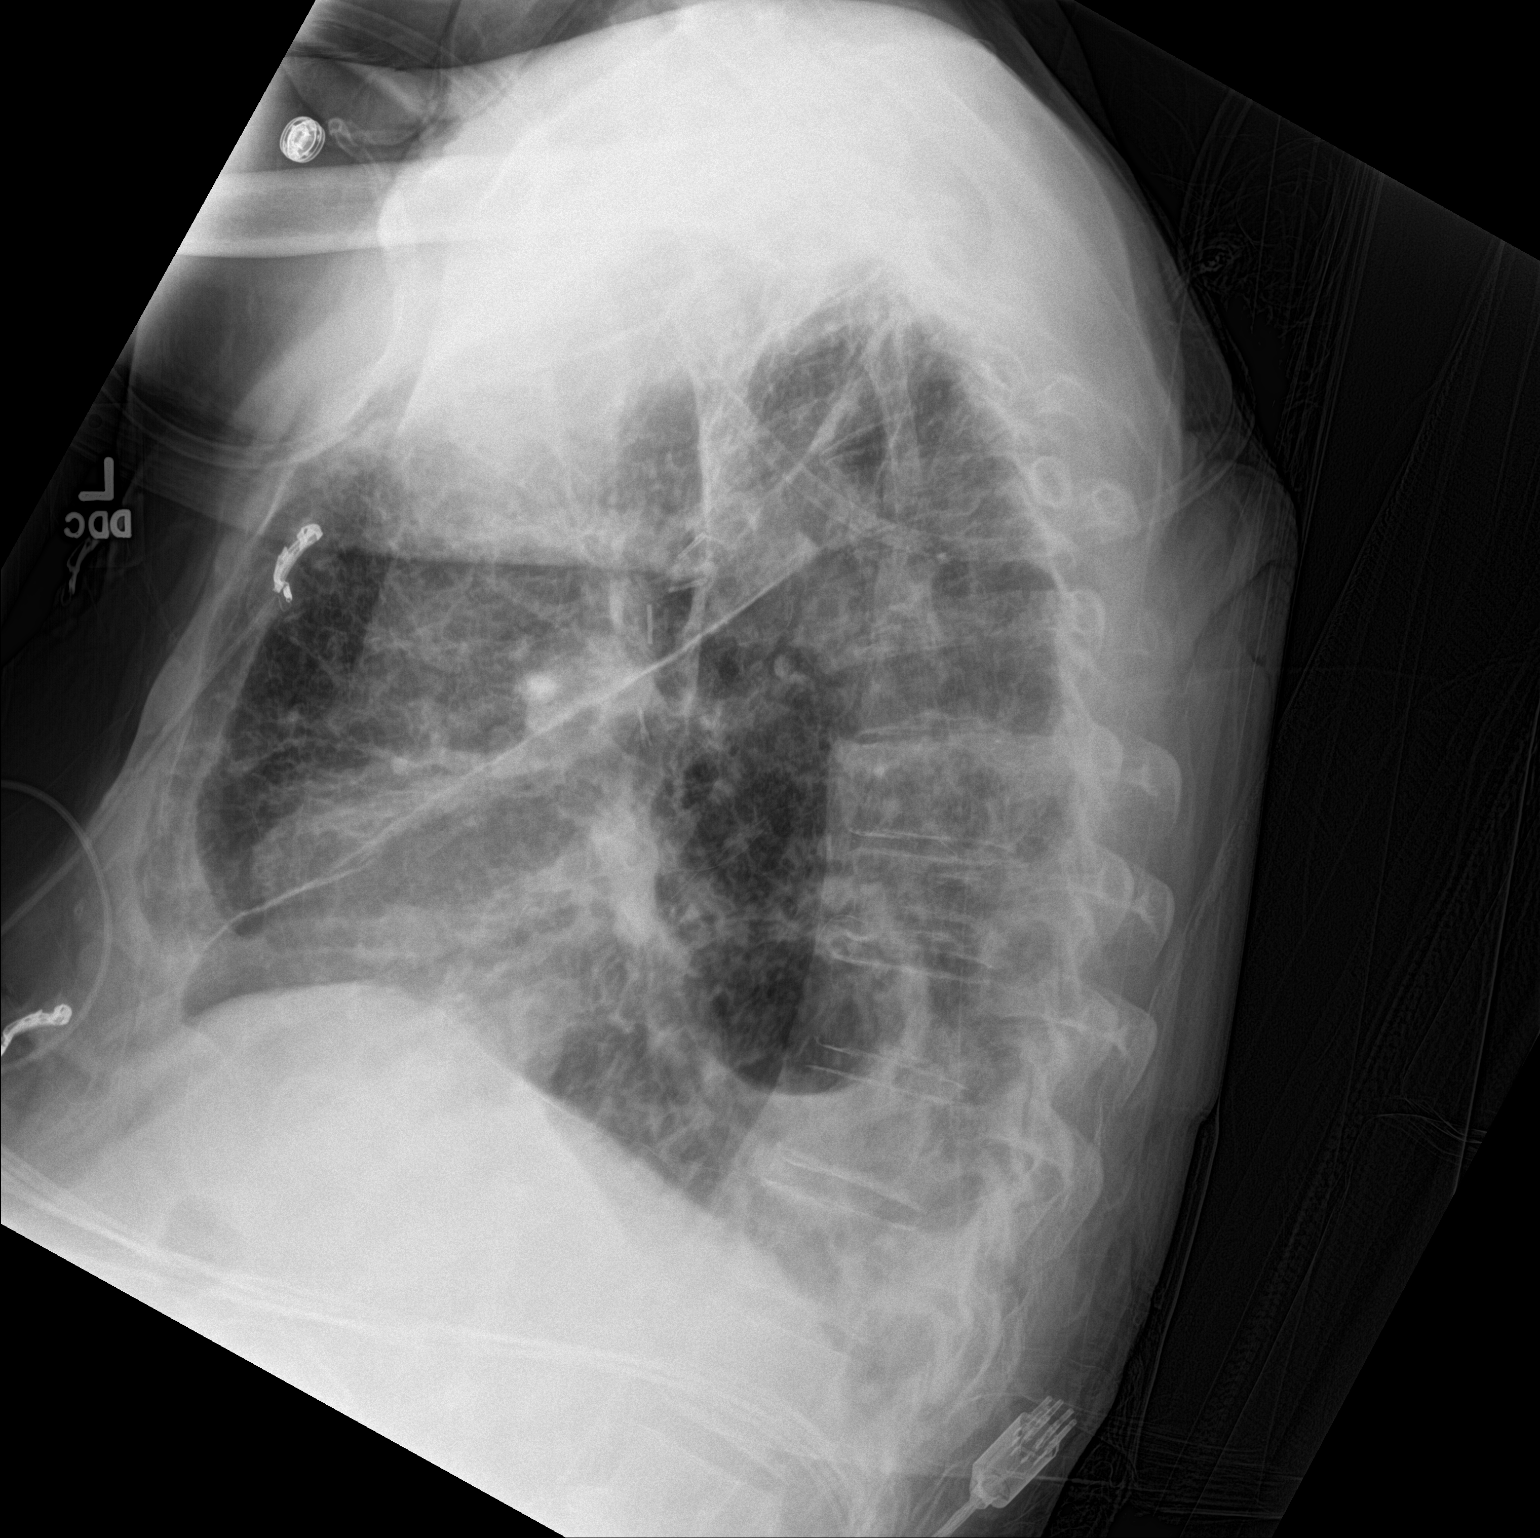
[im 2/2]
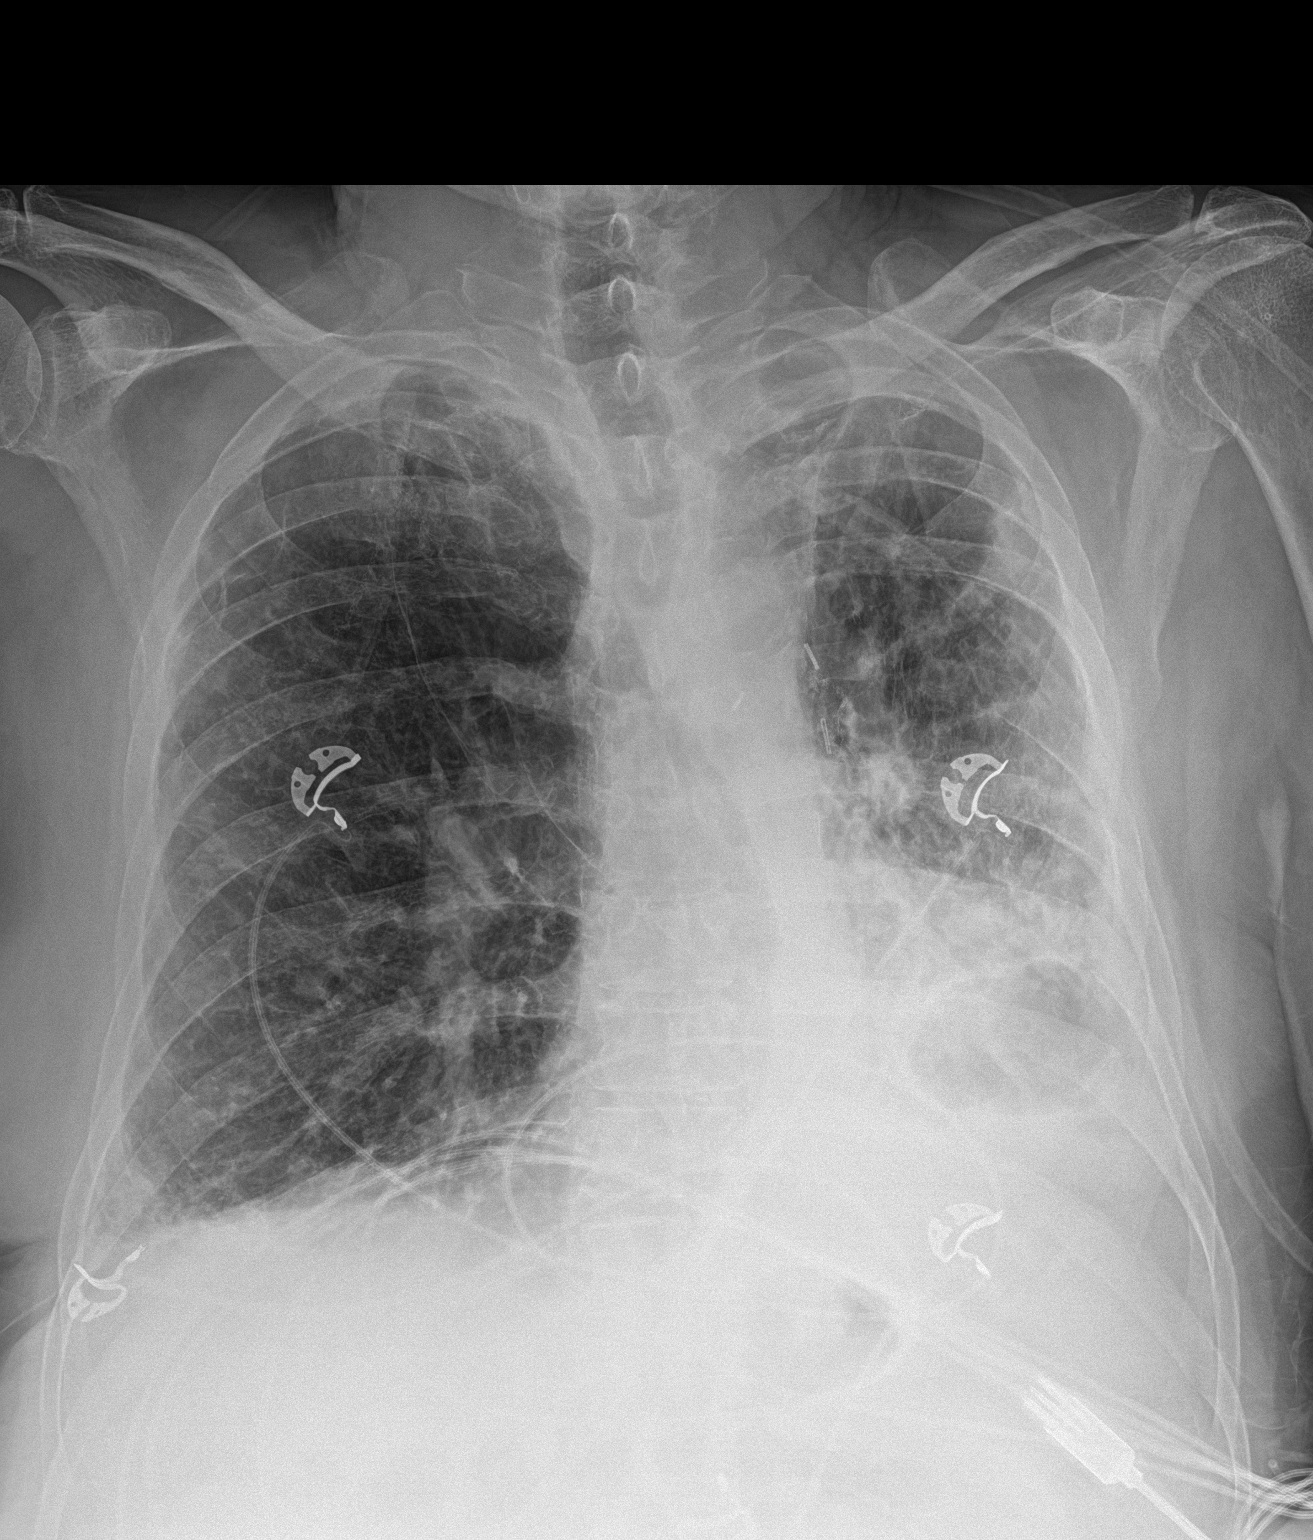

[2 of 2 positions shown; findings below may reference images not displayed]

FINDINGS: Chronic changes are again noted throughout the left lung with
cavitary area at the left lung base. This is stable when compared to
prior study. Underlying COPD. Biapical scarring. No confluent
opacities to the right. Heart is borderline in size.
IMPRESSION: Stable COPD. Stable chronic changes throughout the left lung
including cavitary area at the left lung base. No acute findings or
change.

## 2018-01-19 IMAGING — DX DG CHEST 1V PORT
1 series · 1 of 1 positions shown · non-contrast
Comparison: 07/22/2016, 04/29/2016.  CT 05/19/2016

CLINICAL DATA: Respiratory failure.

EXAM:
PORTABLE CHEST 1 VIEW

[chest ap]
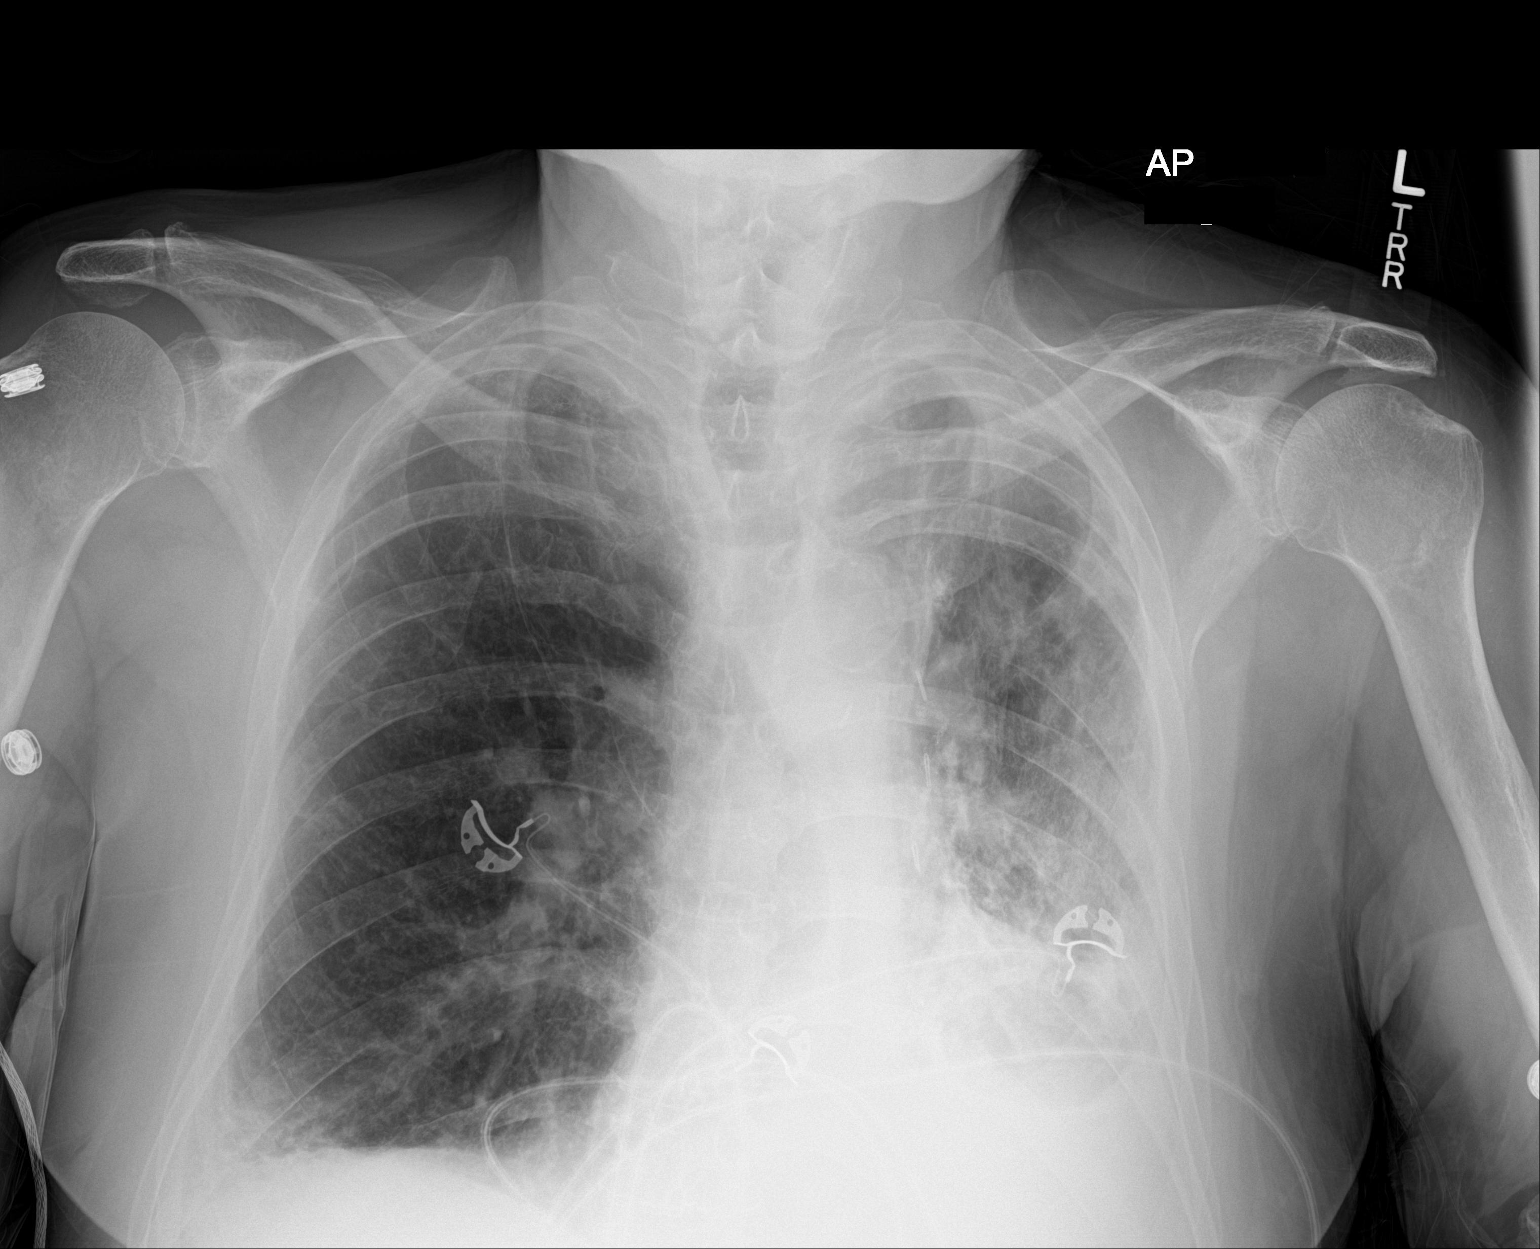

[1 of 1 positions shown; findings below may reference images not displayed]

FINDINGS: Mediastinum hilar structures are stable. Heart size stable.
Postsurgical changes left lung. Stable chronic changes in the left
lung consistent pleural parenchymal scarring. Stable cavitary lesion
in the left lung base. Stable atelectatic changes and/or scarring
right lung base. No pneumothorax .
IMPRESSION: 1. Stable postsurgical changes and pleural-parenchymal scarring left
lung. Stable cavitary lesion left lung base.

2. Stable subsegmental atelectasis and/or scarring right lung base.
No acute abnormality identified. Chest is unchanged from prior
exams.

## 2018-03-14 DIAGNOSIS — Z79899 Other long term (current) drug therapy: Secondary | ICD-10-CM | POA: Diagnosis not present

## 2018-03-14 DIAGNOSIS — I5032 Chronic diastolic (congestive) heart failure: Secondary | ICD-10-CM | POA: Diagnosis not present

## 2018-03-16 ENCOUNTER — Ambulatory Visit: Payer: Medicare Other

## 2018-03-16 ENCOUNTER — Ambulatory Visit: Payer: Medicare Other | Admitting: Oncology

## 2018-03-16 ENCOUNTER — Other Ambulatory Visit: Payer: Medicare Other

## 2018-03-21 DIAGNOSIS — I5032 Chronic diastolic (congestive) heart failure: Secondary | ICD-10-CM | POA: Diagnosis not present

## 2018-03-21 DIAGNOSIS — M5136 Other intervertebral disc degeneration, lumbar region: Secondary | ICD-10-CM | POA: Diagnosis not present

## 2018-03-22 ENCOUNTER — Other Ambulatory Visit: Payer: Medicare Other

## 2018-03-22 ENCOUNTER — Ambulatory Visit: Payer: Medicare Other

## 2018-03-22 ENCOUNTER — Ambulatory Visit: Payer: Medicare Other | Admitting: Oncology

## 2018-03-27 NOTE — Progress Notes (Signed)
[place]  Telephone:(336) [phone] Fax:(336) [phone]  ID: [name] OB: [birth year]  MR#: [id number]  OHY#:[id number]  Patient Care Team: Asencion Noble, MD as PCP - General (Internal Medicine) Minna Merritts, MD (Cardiology)  CHIEF COMPLAINT:  Iron deficiency anemia.  INTERVAL HISTORY: Patient returns to clinic today for repeat laboratory work, further evaluation, and consideration of IV Feraheme.  He was recently evaluated by pulmonology and is receiving treatment for MAC.  He does not complain of weakness or fatigue today. He has no neurologic complaints. He denies any fevers. He denies any chest pain or shortness of breath.  He has a good appetite and denies weight loss.  He has no nausea, vomiting, or constipation.  He denies any melena or hematochezia.  He has no urinary complaints.  Patient feels at his baseline offers no specific complaints today.  REVIEW OF SYSTEMS:   Review of Systems  Constitutional: Negative for fever, malaise/fatigue and weight loss.  Respiratory: Negative.  Negative for cough, hemoptysis and shortness of breath.   Cardiovascular: Negative.  Negative for chest pain and leg swelling.  Gastrointestinal: Negative.  Negative for abdominal pain, blood in stool, diarrhea and melena.  Genitourinary: Negative.  Negative for hematuria.  Musculoskeletal: Negative.   Skin: Negative.  Negative for rash.  Neurological: Negative.  Negative for sensory change, focal weakness and weakness.  Psychiatric/Behavioral: Negative.  The patient is not nervous/anxious.     As per HPI. Otherwise, a complete review of systems is negative.  PAST MEDICAL HISTORY: Past Medical History:  Diagnosis Date  . Acute MI [facility])    pt denies  . Arthritis of lumbar spine   . Asthma   . Clotting disorder ([place])   . Collapsed lung    x3  . COPD (chronic obstructive pulmonary disease) ([place])   . Dyspnea   . History of benign thyroid tumor   . Lung cancer ([place])   .  Mycobacterium avium complex ([place]) 06/24/2016  . Pneumonia   . Pneumothorax   . Small cell carcinoma of lung ([place])   . Spontaneous pneumothorax     PAST SURGICAL HISTORY: Past Surgical History:  Procedure Laterality Date  . APPENDECTOMY    . benign thryoid nodule resected    . CARDIAC CATHETERIZATION  2013   [facility]  . CHEST TUBE INSERTION    . COLONOSCOPY N/A 08/30/2015   Procedure: COLONOSCOPY;  Surgeon: Rogene Houston, MD;  Location[facility];  Service: Endoscopy;  Laterality: N/A;  1:45  . left upper lobectomy for small cell lung cancer    . VATS R thoracotomy      FAMILY HISTORY: Family History  Problem Relation Age of Onset  . Heart disease Mother   . Colon cancer Father   . Heart failure Brother     ADVANCED DIRECTIVES (Y/N):  N  HEALTH MAINTENANCE: Social History   Tobacco Use  . Smoking status: Former Smoker    Packs/day: 3.00    Years: 30.00    Pack years: 90.00    Types: Cigars    Last attempt to quit: 03/02/1992    Years since quitting: 26.0  . Smokeless tobacco: Never Used  . Tobacco comment: quit 22 yrs ago  Substance Use Topics  . Alcohol use: No    Alcohol/week: 0.0 oz  . Drug use: No     Colonoscopy:  PAP:  Bone density:  Lipid panel:  Allergies  Allergen Reactions  . Neomy-Bacit-Polymyx-Pramoxine Anaphylaxis  . Other Other (See  Comments)    Hydromet cough syrup-causes GI upset  . Codeine Nausea And Vomiting    Current Outpatient Medications  Medication Sig Dispense Refill  . acetaminophen (TYLENOL) 325 MG tablet Take 650 mg by mouth as needed.      Marland Kitchen albuterol (PROVENTIL HFA;VENTOLIN HFA) 108 (90 Base) MCG/ACT inhaler Inhale 2 puffs into the lungs every 6 (six) hours as needed for wheezing or shortness of breath. 1 Inhaler 12  . aspirin 81 MG EC tablet Take 81 mg by mouth daily. Swallow whole.    Marland Kitchen atorvastatin (LIPITOR) 10 MG tablet TAKE 1 TABLET BY MOUTH EVERY DAY 90 tablet 3  . azithromycin (ZITHROMAX) 250 MG tablet Take 1 tablet  (250 mg total) by mouth daily. 30 each 11  . Fluticasone-Salmeterol (WIXELA INHUB) 100-50 MCG/DOSE AEPB Inhale 1 puff into the lungs 2 (two) times daily. 60 each 6  . furosemide (LASIX) 20 MG tablet Take 2 tablets (40 mg total) by mouth daily. (Patient taking differently: Take 20 mg by mouth daily. ) 30 tablet   . guaiFENesin (MUCINEX) 600 MG 12 hr tablet Take 600 mg by mouth daily.     . potassium chloride (KLOR-CON) 20 MEQ packet Take 20 mEq by mouth daily.     . prednisoLONE acetate (PRED FORTE) 1 % ophthalmic suspension Administer 1 drop into the left eye Four (4) times a day.    . traMADol (ULTRAM) 50 MG tablet Take 1 tablet (50 mg total) by mouth every 6 (six) hours as needed. Takes 2 tablets 4 times daily as needed for pain. 20 tablet 0  . Fluticasone-Salmeterol (ADVAIR DISKUS) 100-50 MCG/DOSE AEPB Inhale 1 puff into the lungs 2 (two) times daily. (Patient not taking: Reported on 03/29/2018) 60 each 12   No current facility-administered medications for this visit.     OBJECTIVE: Vitals:   03/29/18 1414  BP: 131/72  Pulse: 89  Resp: 18  Temp: (!) 97.5 F (36.4 C)     Body mass index is 22.8 kg/m.    ECOG FS:0 - Asymptomatic  General: Well-developed, well-nourished, no acute distress. Eyes: Pink conjunctiva, anicteric sclera. HEENT: Normocephalic, moist mucous membranes, clear oropharnyx. Lungs: Clear to auscultation bilaterally. Heart: Regular rate and rhythm. No rubs, murmurs, or gallops. Abdomen: Soft, nontender, nondistended. No organomegaly noted, normoactive bowel sounds. Musculoskeletal: No edema, cyanosis, or clubbing. Neuro: Alert, answering all questions appropriately. Cranial nerves grossly intact. Skin: No rashes or petechiae noted. Psych: Normal affect.  LAB RESULTS:  Lab Results  Component Value Date   NA 134 (L) 08/12/2016   K 3.5 08/12/2016   CL 97 (L) 08/12/2016   CO2 25 08/12/2016   GLUCOSE 92 08/12/2016   BUN 9 08/12/2016   CREATININE 0.86 08/12/2016     CALCIUM 8.9 08/12/2016   PROT 6.1 (L) 08/12/2016   ALBUMIN 3.0 (L) 08/12/2016   AST 23 08/12/2016   ALT 30 08/12/2016   ALKPHOS 51 08/12/2016   BILITOT 0.9 08/12/2016   GFRNONAA >60 08/12/2016   GFRAA >60 08/12/2016    Lab Results  Component Value Date   WBC 13.1 (H) 03/29/2018   NEUTROABS 11.4 (H) 03/29/2018   HGB 10.7 (L) 03/29/2018   HCT 32.1 (L) 03/29/2018   MCV 85.4 03/29/2018   PLT 486 (H) 03/29/2018   Lab Results  Component Value Date   IRON 20 (L) 03/29/2018   TIBC 233 (L) 03/29/2018   IRONPCTSAT 9 (L) 03/29/2018   Lab Results  Component Value Date   FERRITIN 449 (H)  03/29/2018     STUDIES: Dg Chest 2 View  Result Date: 03/29/2018 CLINICAL DATA:  History of lung carcinoma on the left. Bronchiectatic change. EXAM: CHEST - 2 VIEW COMPARISON:  Feb 19, 2017 FINDINGS: Postoperative changes noted on the left. An approximately 5 cm cystic area in the left base persists. There is chronic left pleural effusion. There has increase in volume loss on the left compared to the previous study. There is widespread fibrotic type change in the lungs. There are bullous type lesions in each upper lobe region. There is lower lobe bronchiectatic change bilaterally. There is no frank edema or consolidation. The heart size is within normal limits. Pulmonary vascularity reflects the postoperative change on the left is stable. Pulmonary vascular is essentially normal on the right. No adenopathy is evident. No bone lesions are appreciable. IMPRESSION: Postoperative change on the left. 5 cm cystic area left base persists with left pleural effusion, chronic. There has been relative increase in volume loss on the left suggesting progression of fibrotic type change. Widespread interstitial disease with fibrosis and bronchiectatic change. Stable cardiac silhouette. No adenopathy appreciable. Electronically Signed   By: Lowella Grip III M.D.   On: 03/29/2018 14:34    ASSESSMENT: Iron deficiency  anemia.  PLAN:    1.  Iron deficiency anemia: Patient's hemoglobin and iron stores have now trended down slightly and he will benefit from IV Feraheme today.  Previously, the remainder of his laboratory work was either negative or within normal limits.  Patient will return to clinic in 1 week for second infusion and then in 4 months for further evaluation and consideration of additional treatment.   2.  MAC: Continue treatment with azithromycin per pulmonology. 3.  Elevated ferritin: Likely secondary to acute phase reactant.  Proceed with IV Feraheme as above. 4.  Leukocytosis: Likely reactive, monitor. 5.  Thrombocytosis: Likely secondary to iron deficiency anemia.  Treatment as above.  Patient expressed understanding and was in agreement with this plan. He also understands that He can call clinic at any time with any questions, concerns, or complaints.    Lloyd Huger, MD   03/31/2018 7:29 AM

## 2018-03-28 ENCOUNTER — Other Ambulatory Visit: Payer: Self-pay | Admitting: *Deleted

## 2018-03-28 DIAGNOSIS — D649 Anemia, unspecified: Secondary | ICD-10-CM

## 2018-03-29 ENCOUNTER — Ambulatory Visit
Admission: RE | Admit: 2018-03-29 | Discharge: 2018-03-29 | Disposition: A | Discharge status: Home / Self Care | Payer: Medicare Other | Source: Ambulatory Visit | Attending: Pulmonary Disease | Admitting: Pulmonary Disease

## 2018-03-29 ENCOUNTER — Encounter: Payer: Self-pay | Admitting: Oncology

## 2018-03-29 ENCOUNTER — Ambulatory Visit (INDEPENDENT_AMBULATORY_CARE_PROVIDER_SITE_OTHER): Payer: Medicare Other | Admitting: Pulmonary Disease

## 2018-03-29 ENCOUNTER — Inpatient Hospital Stay: Payer: Medicare Other | Attending: Oncology

## 2018-03-29 ENCOUNTER — Inpatient Hospital Stay (HOSPITAL_BASED_OUTPATIENT_CLINIC_OR_DEPARTMENT_OTHER): Payer: Medicare Other | Admitting: Oncology

## 2018-03-29 ENCOUNTER — Telehealth: Payer: Self-pay | Admitting: Pulmonary Disease

## 2018-03-29 ENCOUNTER — Encounter: Payer: Self-pay | Admitting: Pulmonary Disease

## 2018-03-29 ENCOUNTER — Inpatient Hospital Stay: Payer: Medicare Other

## 2018-03-29 ENCOUNTER — Other Ambulatory Visit: Payer: Self-pay

## 2018-03-29 VITALS — BP 131/72 | HR 89 | Temp 97.5°F | Resp 18 | Wt 145.6 lb

## 2018-03-29 VITALS — BP 138/82 | HR 88 | Ht 67.0 in | Wt 145.0 lb

## 2018-03-29 VITALS — BP 141/75 | HR 73 | Temp 96.8°F | Resp 18

## 2018-03-29 DIAGNOSIS — Z9889 Other specified postprocedural states: Secondary | ICD-10-CM | POA: Insufficient documentation

## 2018-03-29 DIAGNOSIS — Z79899 Other long term (current) drug therapy: Secondary | ICD-10-CM

## 2018-03-29 DIAGNOSIS — R0602 Shortness of breath: Secondary | ICD-10-CM | POA: Insufficient documentation

## 2018-03-29 DIAGNOSIS — Z87891 Personal history of nicotine dependence: Secondary | ICD-10-CM

## 2018-03-29 DIAGNOSIS — D509 Iron deficiency anemia, unspecified: Secondary | ICD-10-CM | POA: Diagnosis not present

## 2018-03-29 DIAGNOSIS — D500 Iron deficiency anemia secondary to blood loss (chronic): Secondary | ICD-10-CM

## 2018-03-29 DIAGNOSIS — J8410 Pulmonary fibrosis, unspecified: Secondary | ICD-10-CM | POA: Insufficient documentation

## 2018-03-29 DIAGNOSIS — A312 Disseminated mycobacterium avium-intracellulare complex (DMAC): Secondary | ICD-10-CM | POA: Insufficient documentation

## 2018-03-29 DIAGNOSIS — R7989 Other specified abnormal findings of blood chemistry: Secondary | ICD-10-CM | POA: Diagnosis not present

## 2018-03-29 DIAGNOSIS — J479 Bronchiectasis, uncomplicated: Secondary | ICD-10-CM

## 2018-03-29 DIAGNOSIS — D72829 Elevated white blood cell count, unspecified: Secondary | ICD-10-CM | POA: Diagnosis not present

## 2018-03-29 DIAGNOSIS — Z800 Family history of malignant neoplasm of digestive organs: Secondary | ICD-10-CM

## 2018-03-29 DIAGNOSIS — J449 Chronic obstructive pulmonary disease, unspecified: Secondary | ICD-10-CM | POA: Insufficient documentation

## 2018-03-29 DIAGNOSIS — D696 Thrombocytopenia, unspecified: Secondary | ICD-10-CM

## 2018-03-29 DIAGNOSIS — D649 Anemia, unspecified: Secondary | ICD-10-CM

## 2018-03-29 DIAGNOSIS — J90 Pleural effusion, not elsewhere classified: Secondary | ICD-10-CM | POA: Insufficient documentation

## 2018-03-29 DIAGNOSIS — A310 Pulmonary mycobacterial infection: Secondary | ICD-10-CM | POA: Diagnosis not present

## 2018-03-29 DIAGNOSIS — J45909 Unspecified asthma, uncomplicated: Secondary | ICD-10-CM | POA: Diagnosis not present

## 2018-03-29 LAB — CBC WITH DIFFERENTIAL/PLATELET
Basophils Absolute: 0.1 10*3/uL (ref 0–0.1)
Basophils Relative: 1 %
EOS ABS: 0.2 10*3/uL (ref 0–0.7)
EOS PCT: 2 %
HCT: 32.1 % — ABNORMAL LOW (ref 40.0–52.0)
Hemoglobin: 10.7 g/dL — ABNORMAL LOW (ref 13.0–18.0)
Lymphocytes Relative: 5 %
Lymphs Abs: 0.6 10*3/uL — ABNORMAL LOW (ref 1.0–3.6)
MCH: 28.5 pg (ref 26.0–34.0)
MCHC: 33.4 g/dL (ref 32.0–36.0)
MCV: 85.4 fL (ref 80.0–100.0)
Monocytes Absolute: 0.7 10*3/uL (ref 0.2–1.0)
Monocytes Relative: 6 %
Neutro Abs: 11.4 10*3/uL — ABNORMAL HIGH (ref 1.4–6.5)
Neutrophils Relative %: 86 %
PLATELETS: 486 10*3/uL — AB (ref 150–440)
RBC: 3.75 MIL/uL — AB (ref 4.40–5.90)
RDW: 14.1 % (ref 11.5–14.5)
WBC: 13.1 10*3/uL — AB (ref 3.8–10.6)

## 2018-03-29 LAB — IRON AND TIBC
Iron: 20 ug/dL — ABNORMAL LOW (ref 45–182)
Saturation Ratios: 9 % — ABNORMAL LOW (ref 17.9–39.5)
TIBC: 233 ug/dL — ABNORMAL LOW (ref 250–450)
UIBC: 213 ug/dL

## 2018-03-29 LAB — FERRITIN: FERRITIN: 449 ng/mL — AB (ref 24–336)

## 2018-03-29 MED ORDER — AZITHROMYCIN 250 MG PO TABS: 250.0000 mg | ORAL_TABLET | Freq: Every day | ORAL | 11 refills | Status: DC

## 2018-03-29 MED ORDER — FLUTICASONE-SALMETEROL 100-50 MCG/DOSE IN AEPB: 1.0000 | INHALATION_SPRAY | Freq: Two times a day (BID) | RESPIRATORY_TRACT | 6 refills | Status: DC

## 2018-03-29 MED ORDER — SODIUM CHLORIDE 0.9 % IV SOLN
Freq: Once | INTRAVENOUS | Status: AC
  Administered 2018-03-29: 15:00:00 via INTRAVENOUS
  Filled 2018-03-29: qty 1000

## 2018-03-29 MED ORDER — SODIUM CHLORIDE 0.9 % IV SOLN
510.0000 mg | Freq: Once | INTRAVENOUS | Status: AC
  Administered 2018-03-29: 510 mg via INTRAVENOUS
  Filled 2018-03-29: qty 17

## 2018-03-29 NOTE — Patient Instructions (Signed)
Continue Advair inhaler or equivalent Continue albuterol inhaler as needed Continue chest percussion vest twice a day Begin azithromycin 250 mg daily Follow-up in 6 weeks at which time we will check liver function test

## 2018-03-29 NOTE — Telephone Encounter (Signed)
Entered

## 2018-03-29 NOTE — Progress Notes (Signed)
Pt here for follow up. Will start azithomycin today for "lung infection."

## 2018-03-29 NOTE — Telephone Encounter (Signed)
Pt is at radiology to get repeat chest xray, no orders placed.

## 2018-04-03 ENCOUNTER — Encounter: Payer: Self-pay | Admitting: Pulmonary Disease

## 2018-04-03 NOTE — Progress Notes (Signed)
PULMONARY OFFICE FOLLOW UP  PROBLEMS: 1) Severe COPD 2) Pulmonary MAC - he has been intolerant to attempts @ Rx 3) Bronchiectasis 4) H/O lung cancer - S/P LLL resection 2000 5) Former smoker - quit 1995  INTERVAL HISTORY: No major events  SUBJ: This is a scheduled ROV.  He remains on Advair and uses albuterol rescue inhaler 0-4 times per day. He is using chest percussion vest 2x per day. He continues to have mild-mod chronic mucus production which is unchanged to slightly worse.  Denies CP, fever, purulent sputum, hemoptysis, LE edema and calf tenderness.  He continues to use the chest percussion vest compliantly and continues to benefit from it.     OBJ: Vitals:   03/29/18 1125 03/29/18 1131  BP:  138/82  Pulse:  88  SpO2:  99%  Weight: 145 lb (65.8 kg)   Height: _0  (1.702 m)   RA  NAD HEENT WNL No JVD Diminished BS, bilateral rhonchi RRR s M NABS, NT, soft No C/C/E No focal neuro deficits  DATA: BMP Latest Ref Rng & Units 08/12/2016 08/04/2016 07/29/2016  Glucose 65 - 99 mg/dL 92 107(H) 103(H)  BUN 6 - 20 mg/dL 9 15 21(H)  Creatinine 0.61 - 1.24 mg/dL 0.86 0.81 1.01  Sodium 135 - 145 mmol/L 134(L) 136 141  Potassium 3.5 - 5.1 mmol/L 3.5 2.9(L) 3.3(L)  Chloride 101 - 111 mmol/L 97(L) 94(L) 100(L)  CO2 22 - 32 mmol/L _1 Calcium 8.9 - 10.3 mg/dL 8.9 8.6(L) 8.9    CBC Latest Ref Rng & Units 03/29/2018 11/16/2017 06/28/2017  WBC 3.8 - 10.6 K/uL 13.1(H) 13.5(H) 12.9(H)  Hemoglobin 13.0 - 18.0 g/dL 10.7(L) 12.0(L) 11.3(L)  Hematocrit 40.0 - 52.0 % 32.1(L) 36.2(L) 33.6(L)  Platelets 150 - 440 K/uL 486(H) 398 383    CXR 7/02: Slight progression of chronic opacities, particularly in LLL  IMPRESSION: 1) Severe COPD 2) S/P LLL resection for lung cancer 2000 3) former smoker 4) severe bronchiectasis due to pulmonary MAC. Previously intolerant to combination abx therapy   PLAN: Continue Advair inhaler or equivalent Continue albuterol inhaler as needed Continue  chest percussion vest twice a day Begin azithromycin 250 mg daily for anti-inflammatory effect Follow-up in 6 weeks at which time we will check LFTs  Merton Border, MD PCCM service Mobile [phone] Pager [phone] 04/03/2018 7:41 PM

## 2018-04-06 ENCOUNTER — Inpatient Hospital Stay: Payer: Medicare Other

## 2018-04-06 VITALS — BP 122/71 | HR 78 | Temp 98.2°F | Resp 18

## 2018-04-06 DIAGNOSIS — R7989 Other specified abnormal findings of blood chemistry: Secondary | ICD-10-CM | POA: Diagnosis not present

## 2018-04-06 DIAGNOSIS — Z79899 Other long term (current) drug therapy: Secondary | ICD-10-CM | POA: Diagnosis not present

## 2018-04-06 DIAGNOSIS — J45909 Unspecified asthma, uncomplicated: Secondary | ICD-10-CM | POA: Diagnosis not present

## 2018-04-06 DIAGNOSIS — D72829 Elevated white blood cell count, unspecified: Secondary | ICD-10-CM | POA: Diagnosis not present

## 2018-04-06 DIAGNOSIS — D696 Thrombocytopenia, unspecified: Secondary | ICD-10-CM | POA: Diagnosis not present

## 2018-04-06 DIAGNOSIS — D500 Iron deficiency anemia secondary to blood loss (chronic): Secondary | ICD-10-CM

## 2018-04-06 DIAGNOSIS — D509 Iron deficiency anemia, unspecified: Secondary | ICD-10-CM | POA: Diagnosis not present

## 2018-04-06 MED ORDER — SODIUM CHLORIDE 0.9 % IV SOLN
510.0000 mg | Freq: Once | INTRAVENOUS | Status: AC
  Administered 2018-04-06: 510 mg via INTRAVENOUS
  Filled 2018-04-06: qty 17

## 2018-04-09 DIAGNOSIS — I25118 Atherosclerotic heart disease of native coronary artery with other forms of angina pectoris: Secondary | ICD-10-CM | POA: Insufficient documentation

## 2018-04-09 NOTE — Progress Notes (Signed)
Echocardiogram 07/20/2016- Cardiology Office Note  Date:  04/12/2018   ID:  [name], [name] 01-21-1946, MRN [id number]  PCP:  Asencion Noble, MD   Chief Complaint  Patient presents with  . Other    12 month follow up. Patient c/o some SOB at times. Meds reviewed verbally with patient.     HPI:  [name] is a pleasant 72 year old gentleman with history of  lung cancer,  CT 05/19/2016 documenting a 7 cm cavity, bulla, left base Treated for mycobacteria, has not tolerated this well COPD  H/O lung cancer - S/P LLL resection 2000 non-ST elevation MI after choking on a vitamin pill,  admission to [facility] 05/09/2012 with peak troponin 1.5.  cardiac catheterization that showed nonocclusive coronary artery disease, several regions of 20% stenosis otherwise normal ejection fraction, normal echocardiogram.  He presents for routine followup of his coronary artery disease, shortness of breath  In follow-up today he reports having continued severe shortness of breath Feels the symptoms are stable Uses flutter vest daily for secretions Medications managed by pulmonary Inhaler, neb, azithro  Had corneal transplant, it is rejecting Performed at [facility] May need repeat procedure  Taking Lasix 20 daily Denies any significant lower extremity edema or abdominal bloating No significant change in weight  Anemia, numbers have been stable Seen by Dr. Grayland Ormond for iron infusion CBC : HBG 10.7  labwork done through primary care  EKG personally reviewed by myself on todays visit Shows normal sinus rhythm rate 90 bpm left axis deviation no significant ST or T-wave changes  Other past medical history reviewed Previously Admitted to the hospital with low sodium Sent  to [facility] for rehabilitation   hospital, Echo 07/20/2016   normal ejection fraction Mildly elevated right heart pressures  Lab work reviewed with him showing creatinine 1.01, BUN 18, potassium 4.6  Admitted to the hospital 07/20/2016  with hyponatremia, weakness, poor intake Prior hospital admission for COPD flare, had failure to thrive at home  Previous pneumonia Reports having long course of antibiotics, prednisone  CT 05/19/2016 documenting a 7 cm cavity, bulla, left base Also Atherosclerosis  Other lab work reviewed including Lung Cx: mycobacteria Lab work from October 2016 with normal creatinine, sodium 132, chloride 91, will LFTs, hematocrit 33.9  Previous  problem with his eye, "Clot and bleeding " anticoagulation was held at that time by outside physician. Details are unavailable He has frequent follow-up with pulmonary for his COPD  hospital admission December 12 2011 with discharge 12/16/2013. Final diagnosis was acute respiratory failure, pneumonia with clinical sepsis, DVT with PE. He was started on xarelto initially 15 mg twice a day then changed to 20 mg daily   on chronic steroids for lung disease. Prior lung cancer 15 years ago with treatment at that time  Echocardiogram 12/13/2011 showed normal LV function, moderately elevated right ventricular systolic pressure Ultrasound of the leg showed DVT in the right posterior tibial vein branch otherwise no other DVT CT scan of the chest showed PE of the right middle lobe and posterior basal right lower lobe pulmonary arteries  Prior lab work, Total cholesterol 158, LDL 65, HDL 54  PMH:   has a past medical history of Acute MI ([place]), Arthritis of lumbar spine, Asthma, Clotting disorder ([place]), Collapsed lung, COPD (chronic obstructive pulmonary disease) ([place]), Dyspnea, History of benign thyroid tumor, Lung cancer ([place]), Mycobacterium avium complex ([place]) (06/24/2016), Pneumonia, Pneumothorax, Small cell carcinoma of lung ([place]), and Spontaneous pneumothorax.  PSH:    Past Surgical History:  Procedure Laterality Date  . APPENDECTOMY    . benign thryoid nodule resected    . CARDIAC CATHETERIZATION  2013   [facility]  . CHEST TUBE INSERTION    . COLONOSCOPY  N/A 08/30/2015   Procedure: COLONOSCOPY;  Surgeon: Rogene Houston, MD;  Location: [facility] ENDO [facility];  Service: Endoscopy;  Laterality: N/A;  1:45  . left upper lobectomy for small cell lung cancer    . VATS R thoracotomy      Current Outpatient Medications  Medication Sig Dispense Refill  . acetaminophen (TYLENOL) 325 MG tablet Take 650 mg by mouth as needed.      Marland Kitchen albuterol (PROVENTIL HFA;VENTOLIN HFA) 108 (90 Base) MCG/ACT inhaler Inhale 2 puffs into the lungs every 6 (six) hours as needed for wheezing or shortness of breath. 1 Inhaler 12  . aspirin 81 MG EC tablet Take 81 mg by mouth daily. Swallow whole.    Marland Kitchen atorvastatin (LIPITOR) 10 MG tablet TAKE 1 TABLET BY MOUTH EVERY DAY 90 tablet 3  . azithromycin (ZITHROMAX) 250 MG tablet Take 1 tablet (250 mg total) by mouth daily. 30 each 11  . Fluticasone-Salmeterol (ADVAIR DISKUS) 100-50 MCG/DOSE AEPB Inhale 1 puff into the lungs 2 (two) times daily. 60 each 12  . furosemide (LASIX) 20 MG tablet Take 2 tablets (40 mg total) by mouth daily. (Patient taking differently: Take 20 mg by mouth daily. ) 30 tablet   . guaiFENesin (MUCINEX) 600 MG 12 hr tablet Take 600 mg by mouth daily.     . potassium chloride (KLOR-CON) 20 MEQ packet Take 20 mEq by mouth daily.     . prednisoLONE acetate (PRED FORTE) 1 % ophthalmic suspension Administer 1 drop into the left eye Four (4) times a day.    . traMADol (ULTRAM) 50 MG tablet Take 1 tablet (50 mg total) by mouth every 6 (six) hours as needed. Takes 2 tablets 4 times daily as needed for pain. 20 tablet 0   No current facility-administered medications for this visit.      Allergies:   Neomy-bacit-polymyx-pramoxine; Other; and Codeine   Social History:  The patient  reports that he quit smoking about 26 years ago. His smoking use included cigars. He has a 90.00 pack-year smoking history. He has never used smokeless tobacco. He reports that he does not drink alcohol or use drugs.   Family History:   family  history includes Colon cancer in his father; Heart disease in his mother; Heart failure in his brother.    Review of Systems: Review of Systems  Constitutional: Positive for weight loss.  Respiratory: Positive for cough.        Chest congestion  Cardiovascular: Negative.   Gastrointestinal: Negative.   Musculoskeletal: Negative.   Neurological: Negative.   Psychiatric/Behavioral: Negative.   All other systems reviewed and are negative.    PHYSICAL EXAM: VS:  BP 128/78 (BP Location: Left Arm, Patient Position: Sitting, Cuff Size: Normal)   Pulse 90   Ht _0  (1.702 m)   Wt 144 lb 12 oz (65.7 kg)   BMI 22.67 kg/m  , BMI Body mass index is 22.67 kg/m. Constitutional:  oriented to person, place, and time. No distress.  HENT:  Head: Normocephalic and atraumatic.  Eyes:  no discharge. No scleral icterus.  Neck: Normal range of motion. Neck supple. No JVD present.  Cardiovascular: Normal rate, regular rhythm, normal heart sounds and intact distal pulses. Exam reveals no gallop and no friction rub. No edema No murmur  heard. Pulmonary/Chest: moderately decreased breath sounds throughout No stridor. No respiratory distress.  no wheezes.  no rales.  no tenderness.  Abdominal: Soft.  no distension.  no tenderness.  Musculoskeletal: Normal range of motion.  no  tenderness or deformity.  Neurological:  normal muscle tone. Coordination normal. No atrophy Skin: Skin is warm and dry. No rash noted. not diaphoretic.  Psychiatric:  normal mood and affect. behavior is normal. Thought content normal.    Recent Labs: 03/29/2018: Hemoglobin 10.7; Platelets 486    Lipid Panel No results found for: CHOL, HDL, LDLCALC, TRIG    Wt Readings from Last 3 Encounters:  04/12/18 144 lb 12 oz (65.7 kg)  03/29/18 145 lb 9.6 oz (66 kg)  03/29/18 145 lb (65.8 kg)     ASSESSMENT AND PLAN:  COPD exacerbation ([place]) follow-up with pulmonary. Chronic secretions, on suppressive antibiotics nebulizers  inhalers Feels his symptoms are stable  Mycobacterium avium complex (HCC) Followed by Dr. Ola Spurr nd Dr. Alva Garnet, did not tolerate aggressive antibiotic regimen chronic disease but feels stable  Coronary artery disease involving native heart without angina pectoris, unspecified vessel or lesion type - Plan: EKG 12-Lead Currently with no symptoms of angina. No further workup at this time. Continue current medication regimen.stable  Other secondary hypertension - Plan: EKG 12-Lead Blood pressure is well controlled on today's visit. No changes made to the medications. stable  Mixed hyperlipidemia Currently on Lipitor labwork done through primary care  Other iron deficiency anemia - Plan: Ambulatory referral to Hematology / Oncology Seen by Dr. Grayland Ormond Feeling better with improved blood count Recent iron infusions  Corneal transplant Long discussion with him, recent rejection Needs further evaluation   Total encounter time more than 25 minutes  Greater than 50% was spent in counseling and coordination of care with the patient   Disposition:   F/U  As needed   Orders Placed This Encounter  Procedures  . EKG 12-Lead     Signed, Esmond Plants, M.D., Ph.D. 04/12/2018  [place], [place]

## 2018-04-12 ENCOUNTER — Encounter: Payer: Self-pay | Admitting: Cardiovascular Disease

## 2018-04-12 ENCOUNTER — Ambulatory Visit (INDEPENDENT_AMBULATORY_CARE_PROVIDER_SITE_OTHER): Payer: Medicare Other | Admitting: Cardiovascular Disease

## 2018-04-12 VITALS — BP 128/78 | HR 90 | Ht 67.0 in | Wt 144.8 lb

## 2018-04-12 DIAGNOSIS — R0602 Shortness of breath: Secondary | ICD-10-CM | POA: Diagnosis not present

## 2018-04-12 DIAGNOSIS — J470 Bronchiectasis with acute lower respiratory infection: Secondary | ICD-10-CM | POA: Diagnosis not present

## 2018-04-12 DIAGNOSIS — J9610 Chronic respiratory failure, unspecified whether with hypoxia or hypercapnia: Secondary | ICD-10-CM

## 2018-04-12 DIAGNOSIS — E782 Mixed hyperlipidemia: Secondary | ICD-10-CM

## 2018-04-12 DIAGNOSIS — A310 Pulmonary mycobacterial infection: Secondary | ICD-10-CM | POA: Diagnosis not present

## 2018-04-12 DIAGNOSIS — I25118 Atherosclerotic heart disease of native coronary artery with other forms of angina pectoris: Secondary | ICD-10-CM | POA: Diagnosis not present

## 2018-04-12 DIAGNOSIS — J449 Chronic obstructive pulmonary disease, unspecified: Secondary | ICD-10-CM

## 2018-04-12 NOTE — Patient Instructions (Signed)

## 2018-05-16 ENCOUNTER — Ambulatory Visit (INDEPENDENT_AMBULATORY_CARE_PROVIDER_SITE_OTHER): Payer: Medicare Other | Admitting: Pulmonary Disease

## 2018-05-16 ENCOUNTER — Encounter: Payer: Self-pay | Admitting: Pulmonary Disease

## 2018-05-16 ENCOUNTER — Telehealth: Payer: Self-pay | Admitting: *Deleted

## 2018-05-16 ENCOUNTER — Other Ambulatory Visit
Admission: RE | Admit: 2018-05-16 | Discharge: 2018-05-16 | Disposition: A | Discharge status: Home / Self Care | Payer: Medicare Other | Source: Ambulatory Visit | Attending: Pulmonary Disease | Admitting: Pulmonary Disease

## 2018-05-16 VITALS — BP 140/78 | HR 94 | Ht 67.0 in | Wt 146.0 lb

## 2018-05-16 DIAGNOSIS — J310 Chronic rhinitis: Secondary | ICD-10-CM | POA: Diagnosis not present

## 2018-05-16 DIAGNOSIS — A310 Pulmonary mycobacterial infection: Secondary | ICD-10-CM | POA: Diagnosis not present

## 2018-05-16 DIAGNOSIS — J479 Bronchiectasis, uncomplicated: Secondary | ICD-10-CM | POA: Diagnosis not present

## 2018-05-16 DIAGNOSIS — I25118 Atherosclerotic heart disease of native coronary artery with other forms of angina pectoris: Secondary | ICD-10-CM | POA: Diagnosis not present

## 2018-05-16 LAB — HEPATIC FUNCTION PANEL
ALK PHOS: 78 U/L (ref 38–126)
ALT: 15 U/L (ref 0–44)
AST: 19 U/L (ref 15–41)
Albumin: 3.8 g/dL (ref 3.5–5.0)
Total Bilirubin: 0.9 mg/dL (ref 0.3–1.2)
Total Protein: 7.4 g/dL (ref 6.5–8.1)

## 2018-05-16 MED ORDER — FLUTICASONE PROPIONATE 50 MCG/ACT NA SUSP: 2.0000 | Freq: Every day | NASAL | 6 refills | Status: DC

## 2018-05-16 NOTE — Telephone Encounter (Signed)
-----   Message from Wilhelmina Mcardle, MD sent at 05/16/2018  1:27 PM EDT ----- Let him know LFTs are all normal  Thanks  [name]

## 2018-05-16 NOTE — Progress Notes (Signed)
PULMONARY OFFICE FOLLOW UP  PROBLEMS: 1) Severe COPD 2) Pulmonary MAC - he has been intolerant to attempts @ Rx 3) Bronchiectasis 4) H/O lung cancer - S/P LLL resection 2000 5) Former smoker - quit 1995  INTERVAL HISTORY: No major events  SUBJ: This is a scheduled ROV.  Last visit, I initiated azithromycin 250 mg daily for its anti-inflammatory effect.  Today is a quick recheck.  He remains on Advair twice a day.[name] He is using chest percussion vest 2x per day.  He continues to have mild chronic mucus production which he thinks is modestly improved since last visit.  He reports increased nasal congestion and rhinorrhea. He denies CP, fever, purulent sputum, hemoptysis, LE edema and calf tenderness.  He continues to use the chest percussion vest compliantly and continues to benefit from it.    OBJ: Vitals:   05/16/18 0937 05/16/18 0938  BP:  140/78  Pulse:  94  SpO2:  96%  Weight: 146 lb (66.2 kg)   Height: _0  (1.702 m)   RA  No overt distress HEENT WNL No JVD BS mildly diminished, R >L crackles in the bases RRR, no M NABS, soft No C/C/E No focal neurologic deficits  DATA: BMP Latest Ref Rng & Units 08/12/2016 08/04/2016 07/29/2016  Glucose 65 - 99 mg/dL 92 107(H) 103(H)  BUN 6 - 20 mg/dL 9 15 21(H)  Creatinine 0.61 - 1.24 mg/dL 0.86 0.81 1.01  Sodium 135 - 145 mmol/L 134(L) 136 141  Potassium 3.5 - 5.1 mmol/L 3.5 2.9(L) 3.3(L)  Chloride 101 - 111 mmol/L 97(L) 94(L) 100(L)  CO2 22 - 32 mmol/L _1 Calcium 8.9 - 10.3 mg/dL 8.9 8.6(L) 8.9    CBC Latest Ref Rng & Units 03/29/2018 11/16/2017 06/28/2017  WBC 3.8 - 10.6 K/uL 13.1(H) 13.5(H) 12.9(H)  Hemoglobin 13.0 - 18.0 g/dL 10.7(L) 12.0(L) 11.3(L)  Hematocrit 40.0 - 52.0 % 32.1(L) 36.2(L) 33.6(L)  Platelets 150 - 440 K/uL 486(H) 398 383    CXR: No new film  IMPRESSION: 1) Severe COPD 2) S/P LLL resection for lung cancer 2000 3) former smoker 4) severe bronchiectasis due to pulmonary MAC. Previously intolerant to  combination abx therapy 5) chronic rhinitis  PLAN: Continue Advair inhaler Continue albuterol inhaler as needed Continue chest percussion vest twice per day Continue azithromycin 250 mg daily Blood work today: Liver function tests New prescription: Flonase nasal spray, 2 sprays per nostril daily.  Follow-up in 6 months with chest x-ray prior to that visit  Merton Border, MD PCCM service Mobile [phone] Pager [phone] 05/16/2018 3:41 PM

## 2018-05-16 NOTE — Patient Instructions (Addendum)
Continue Advair inhaler Continue albuterol inhaler as needed Continue chest percussion vest twice per day Continue azithromycin 250 mg daily Blood work today: Liver function tests New prescription: Flonase nasal spray, 2 sprays per nostril daily.  Follow-up in 6 months with chest x-ray prior to that visit

## 2018-05-16 NOTE — Telephone Encounter (Signed)
Informed of results. Nothing further needed.

## 2018-05-23 DIAGNOSIS — I5032 Chronic diastolic (congestive) heart failure: Secondary | ICD-10-CM | POA: Diagnosis not present

## 2018-06-06 DIAGNOSIS — I5032 Chronic diastolic (congestive) heart failure: Secondary | ICD-10-CM | POA: Diagnosis not present

## 2018-06-06 DIAGNOSIS — M5136 Other intervertebral disc degeneration, lumbar region: Secondary | ICD-10-CM | POA: Diagnosis not present

## 2018-06-17 DIAGNOSIS — H524 Presbyopia: Secondary | ICD-10-CM | POA: Diagnosis not present

## 2018-06-17 DIAGNOSIS — Z961 Presence of intraocular lens: Secondary | ICD-10-CM | POA: Diagnosis not present

## 2018-06-17 DIAGNOSIS — T86841 Corneal transplant failure: Secondary | ICD-10-CM | POA: Diagnosis not present

## 2018-06-17 DIAGNOSIS — H1820 Unspecified corneal edema: Secondary | ICD-10-CM | POA: Diagnosis not present

## 2018-06-22 DIAGNOSIS — Z23 Encounter for immunization: Secondary | ICD-10-CM | POA: Diagnosis not present

## 2018-06-28 ENCOUNTER — Other Ambulatory Visit: Payer: Self-pay

## 2018-06-28 DIAGNOSIS — T86841 Corneal transplant failure: Secondary | ICD-10-CM | POA: Diagnosis not present

## 2018-06-28 DIAGNOSIS — H18892 Other specified disorders of cornea, left eye: Secondary | ICD-10-CM | POA: Diagnosis not present

## 2018-07-28 ENCOUNTER — Encounter: Payer: Self-pay | Admitting: *Deleted

## 2018-07-28 ENCOUNTER — Other Ambulatory Visit: Payer: Self-pay

## 2018-07-28 ENCOUNTER — Emergency Department
Admission: EM | Admit: 2018-07-28 | Discharge: 2018-07-28 | Disposition: A | Discharge status: Home / Self Care | Payer: Medicare Other | Attending: Emergency Medicine | Admitting: Emergency Medicine

## 2018-07-28 DIAGNOSIS — Y929 Unspecified place or not applicable: Secondary | ICD-10-CM | POA: Insufficient documentation

## 2018-07-28 DIAGNOSIS — Z79899 Other long term (current) drug therapy: Secondary | ICD-10-CM | POA: Insufficient documentation

## 2018-07-28 DIAGNOSIS — J45909 Unspecified asthma, uncomplicated: Secondary | ICD-10-CM | POA: Diagnosis not present

## 2018-07-28 DIAGNOSIS — I252 Old myocardial infarction: Secondary | ICD-10-CM | POA: Insufficient documentation

## 2018-07-28 DIAGNOSIS — W228XXA Striking against or struck by other objects, initial encounter: Secondary | ICD-10-CM | POA: Diagnosis not present

## 2018-07-28 DIAGNOSIS — Z66 Do not resuscitate: Secondary | ICD-10-CM | POA: Diagnosis not present

## 2018-07-28 DIAGNOSIS — Z7982 Long term (current) use of aspirin: Secondary | ICD-10-CM | POA: Insufficient documentation

## 2018-07-28 DIAGNOSIS — J449 Chronic obstructive pulmonary disease, unspecified: Secondary | ICD-10-CM | POA: Insufficient documentation

## 2018-07-28 DIAGNOSIS — I2510 Atherosclerotic heart disease of native coronary artery without angina pectoris: Secondary | ICD-10-CM | POA: Diagnosis not present

## 2018-07-28 DIAGNOSIS — S90414A Abrasion, right lesser toe(s), initial encounter: Secondary | ICD-10-CM | POA: Diagnosis not present

## 2018-07-28 DIAGNOSIS — Z87891 Personal history of nicotine dependence: Secondary | ICD-10-CM | POA: Insufficient documentation

## 2018-07-28 DIAGNOSIS — Y998 Other external cause status: Secondary | ICD-10-CM | POA: Diagnosis not present

## 2018-07-28 DIAGNOSIS — S99921A Unspecified injury of right foot, initial encounter: Secondary | ICD-10-CM | POA: Diagnosis present

## 2018-07-28 DIAGNOSIS — Y9389 Activity, other specified: Secondary | ICD-10-CM | POA: Diagnosis not present

## 2018-07-28 DIAGNOSIS — Z85118 Personal history of other malignant neoplasm of bronchus and lung: Secondary | ICD-10-CM | POA: Insufficient documentation

## 2018-07-28 MED ORDER — SILVER NITRATE-POT NITRATE 75-25 % EX MISC
CUTANEOUS | Status: AC
  Administered 2018-07-28: 04:00:00
  Filled 2018-07-28: qty 3

## 2018-07-28 NOTE — ED Triage Notes (Signed)
Half inch laceration noted to right pinky toe. Pt has been unable to stop the bleeding. Pt only takes baby aspirin as a blood thinner. New dressing applied and right leg elevated in triage.

## 2018-07-28 NOTE — ED Provider Notes (Signed)
[facility] Emergency Department Provider Note    First MD Initiated Contact with Patient 07/28/18 0400     (approximate)  I have reviewed the triage vital signs and the nursing notes.   HISTORY  Chief Complaint Laceration    HPI [name] is a 72 y.o. male with below list of chronic medical conditions presents to the emergency department with accidental injury to his right fifth toe with resultant bleeding.  Patient states that he hit his foot against something while going to the restroom and has had bleeding from the area since that he has been unable to control.  The patient states that he only takes a baby aspirin and no other anticoagulant.  Past Medical History:  Diagnosis Date  . Acute MI [facility])    pt denies  . Arthritis of lumbar spine   . Asthma   . Clotting disorder ([place])   . Collapsed lung    x3  . COPD (chronic obstructive pulmonary disease) ([place])   . Dyspnea   . History of benign thyroid tumor   . Lung cancer ([place])   . Mycobacterium avium complex ([place]) 06/24/2016  . Pneumonia   . Pneumothorax   . Small cell carcinoma of lung ([place])   . Spontaneous pneumothorax     Patient Active Problem List   Diagnosis Date Noted  . Atherosclerosis of native coronary artery of native heart with stable angina pectoris ([place]) 04/09/2018  . Iron deficiency anemia due to chronic blood loss 09/15/2016  . Palliative care encounter   . DNR (do not resuscitate) discussion   . Protein-calorie malnutrition, severe 07/24/2016  . Respiratory failure ([place])   . Acute respiratory failure ([place]) 07/22/2016  . Iron deficiency anemia 07/21/2016  . General weakness   . Hyponatremia 07/20/2016  . COPD exacerbation ([place]) 06/28/2016  . Mycobacterium avium complex ([place]) 06/24/2016  . Bronchiectasis with acute lower respiratory infection ([place]) 05/20/2016  . Lung disease, bullous ([place]) 05/19/2016  . Peripheral edema 12/30/2014  . Encounter for therapeutic drug  monitoring 03/15/2014  . Mediastinal adenopathy 01/26/2014  . Tick bite of axillary region 12/26/2013  . Pulmonary embolus ([place]) 12/22/2013  . PNA (pneumonia) 12/22/2013  . Hyperlipidemia 12/26/2012  . NSTEMI (non-ST elevated myocardial infarction) ([place]) 06/03/2012  . HTN (hypertension) 06/03/2012  . Adenocarcinoma of left lung ([place]) 04/30/2008  . DYSPNEA 11/18/2007  . COPD mixed type ([place]) 11/06/2007  . SPONTANEOUS PNEUMOTHORAX 10/31/2007    Past Surgical History:  Procedure Laterality Date  . APPENDECTOMY    . benign thryoid nodule resected    . CARDIAC CATHETERIZATION  2013   [facility]  . CHEST TUBE INSERTION    . COLONOSCOPY N/A 08/30/2015   Procedure: COLONOSCOPY;  Surgeon: Rogene Houston, MD;  Location: [facility];  Service: Endoscopy;  Laterality: N/A;  1:45  . left upper lobectomy for small cell lung cancer    . VATS R thoracotomy      Prior to Admission medications   Medication Sig Start Date End Date Taking? Authorizing Provider  acetaminophen (TYLENOL) 325 MG tablet Take 650 mg by mouth as needed.      [provider]  albuterol (PROVENTIL HFA;VENTOLIN HFA) 108 (90 Base) MCG/ACT inhaler Inhale 2 puffs into the lungs every 6 (six) hours as needed for wheezing or shortness of breath. 03/02/16   Baird Lyons D, MD  aspirin 81 MG EC tablet Take 81 mg by mouth daily. Swallow whole.    [provider]  atorvastatin (LIPITOR) 10 MG tablet TAKE 1 TABLET BY MOUTH EVERY DAY 06/17/15   Minna Merritts, MD  azithromycin (ZITHROMAX) 250 MG tablet Take 1 tablet (250 mg total) by mouth daily. 03/29/18   Wilhelmina Mcardle, MD  fluticasone (FLONASE) 50 MCG/ACT nasal spray Place 2 sprays into both nostrils daily. 05/16/18 05/16/19  Wilhelmina Mcardle, MD  Fluticasone-Salmeterol (ADVAIR DISKUS) 100-50 MCG/DOSE AEPB Inhale 1 puff into the lungs 2 (two) times daily. 02/19/17   Wilhelmina Mcardle, MD  furosemide (LASIX) 20 MG tablet Take 2 tablets (40 mg total) by mouth  daily. Patient taking differently: Take 20 mg by mouth daily.  07/29/16   [name], MD  guaiFENesin (MUCINEX) 600 MG 12 hr tablet Take 600 mg by mouth daily.     [provider]  potassium chloride (KLOR-CON) 20 MEQ packet Take 20 mEq by mouth daily.     [provider]  prednisoLONE acetate (PRED FORTE) 1 % ophthalmic suspension Administer 1 drop into the left eye Four (4) times a day. 10/19/17   [provider]  traMADol (ULTRAM) 50 MG tablet Take 1 tablet (50 mg total) by mouth every 6 (six) hours as needed. Takes 2 tablets 4 times daily as needed for pain. 07/29/16   [name], MD    Allergies Neomy-bacit-polymyx-pramoxine; Other; and Codeine  Family History  Problem Relation Age of Onset  . Heart disease Mother   . Colon cancer Father   . Heart failure Brother     Social History Social History   Tobacco Use  . Smoking status: Former Smoker    Packs/day: 3.00    Years: 30.00    Pack years: 90.00    Types: Cigars    Last attempt to quit: 03/02/1992    Years since quitting: 26.4  . Smokeless tobacco: Never Used  . Tobacco comment: quit 22 yrs ago  Substance Use Topics  . Alcohol use: No    Alcohol/week: 0.0 standard drinks  . Drug use: No    Review of Systems Constitutional: No fever/chills Eyes: No visual changes. ENT: No sore throat. Cardiovascular: Denies chest pain. Respiratory: Denies shortness of breath. Gastrointestinal: No abdominal pain.  No nausea, no vomiting.  No diarrhea.  No constipation. Genitourinary: Negative for dysuria. Musculoskeletal: Negative for neck pain.  Negative for back pain. Integumentary: Negative for rash.  Positive for right fifth toe bleeding Neurological: Negative for headaches, focal weakness or numbness.   ____________________________________________   PHYSICAL EXAM:  VITAL SIGNS: ED Triage Vitals  Enc Vitals Group     BP 07/28/18 0140 (!) 154/83     Pulse Rate 07/28/18 0140 93     Resp  07/28/18 0140 18     Temp 07/28/18 0140 98.5 F (36.9 C)     Temp Source 07/28/18 0140 Oral     SpO2 07/28/18 0140 95 %     Weight 07/28/18 0202 66.2 kg (145 lb 15.1 oz)     Height --      Head Circumference --      Peak Flow --      Pain Score 07/28/18 0202 3     Pain Loc --      Pain Edu? --      Excl. in [place]? --     Constitutional: Alert and oriented. Well appearing and in no acute distress. Eyes: Conjunctivae are normal.  Head: Atraumatic. Neck: No stridor. Cardiovascular: Normal rate, regular rhythm. Good peripheral circulation. Grossly normal heart sounds. Respiratory: Normal  respiratory effort.  No retractions. Lungs CTAB. Skin: 1 cm area of skin avulsion right fifth toe actively bleeding. Psychiatric: Mood and affect are normal. Speech and behavior are normal.  ____________________________________________   ____  Procedures   ____________________________________________   INITIAL IMPRESSION / ASSESSMENT AND PLAN / ED COURSE  As part of my medical decision making, I reviewed the following data within the electronic MEDICAL RECORD NUMBER   72 year old male presenting with above-stated history and physical exam secondary to skin avulsion to the right fifth toe with persistent bleeding.  Silver nitrate used on the area with resolution of bleeding. ____________________________________________  FINAL CLINICAL IMPRESSION(S) / ED DIAGNOSES  Final diagnoses:  Abrasion of fifth toe of right foot, initial encounter     MEDICATIONS GIVEN DURING THIS VISIT:  Medications  silver nitrate applicators 32-54 % applicator (has no administration in time range)     ED Discharge Orders    None       Note:  This document was prepared using Dragon voice recognition software and may include unintentional dictation errors.    Gregor Hams, MD 07/28/18 [phone]

## 2018-07-31 NOTE — Progress Notes (Signed)
[name]  Telephone:(336) [phone] Fax:(336) [phone]  ID: [name] OB: July 03, 1946  MR#: [id number]  FMB#:[id number]  Patient Care Team: Asencion Noble, MD as PCP - General (Internal Medicine) Minna Merritts, MD (Cardiology)  CHIEF COMPLAINT:  Iron deficiency anemia.  INTERVAL HISTORY: Patient returns to clinic today for repeat laboratory work and further evaluation.  He has mild weakness and fatigue, but otherwise feels well.  He has no neurologic complaints. He denies any fevers. He denies any chest pain, cough, hemoptysis, or shortness of breath.  He has a good appetite and denies weight loss.  He denies any nausea, vomiting, constipation, or diarrhea.  He denies any melena or hematochezia.  He has no urinary complaints.  Patient offers no further specific complaints today.  REVIEW OF SYSTEMS:   Review of Systems  Constitutional: Positive for malaise/fatigue. Negative for fever and weight loss.  Respiratory: Negative.  Negative for cough, hemoptysis and shortness of breath.   Cardiovascular: Negative.  Negative for chest pain and leg swelling.  Gastrointestinal: Negative.  Negative for abdominal pain, blood in stool, diarrhea and melena.  Genitourinary: Negative.  Negative for hematuria.  Musculoskeletal: Negative.  Negative for back pain.  Skin: Negative.  Negative for rash.  Neurological: Positive for weakness. Negative for sensory change, focal weakness and headaches.  Psychiatric/Behavioral: Negative.  The patient is not nervous/anxious.     As per HPI. Otherwise, a complete review of systems is negative.  PAST MEDICAL HISTORY: Past Medical History:  Diagnosis Date  . Acute MI [facility])    pt denies  . Arthritis of lumbar spine   . Asthma   . Clotting disorder ([place])   . Collapsed lung    x3  . COPD (chronic obstructive pulmonary disease) ([place])   . Dyspnea   . History of benign thyroid tumor   . Lung cancer ([place])   . Mycobacterium avium complex  ([place]) 06/24/2016  . Pneumonia   . Pneumothorax   . Small cell carcinoma of lung ([place])   . Spontaneous pneumothorax     PAST SURGICAL HISTORY: Past Surgical History:  Procedure Laterality Date  . APPENDECTOMY    . benign thryoid nodule resected    . CARDIAC CATHETERIZATION  2013   [facility]  . CHEST TUBE INSERTION    . COLONOSCOPY N/A 08/30/2015   Procedure: COLONOSCOPY;  Surgeon: Rogene Houston, MD;  Location: [facility];  Service: Endoscopy;  Laterality: N/A;  1:45  . left upper lobectomy for small cell lung cancer    . VATS R thoracotomy      FAMILY HISTORY: Family History  Problem Relation Age of Onset  . Heart disease Mother   . Colon cancer Father   . Heart failure Brother     ADVANCED DIRECTIVES (Y/N):  N  HEALTH MAINTENANCE: Social History   Tobacco Use  . Smoking status: Former Smoker    Packs/day: 3.00    Years: 30.00    Pack years: 90.00    Types: Cigars    Last attempt to quit: 03/02/1992    Years since quitting: 26.4  . Smokeless tobacco: Never Used  . Tobacco comment: quit 22 yrs ago  Substance Use Topics  . Alcohol use: No    Alcohol/week: 0.0 standard drinks  . Drug use: No     Colonoscopy:  PAP:  Bone density:  Lipid panel:  Allergies  Allergen Reactions  . Neomy-Bacit-Polymyx-Pramoxine Anaphylaxis  . Other Other (See Comments)    Hydromet cough syrup-causes  GI upset  . Codeine Nausea And Vomiting    Current Outpatient Medications  Medication Sig Dispense Refill  . acetaminophen (TYLENOL) 325 MG tablet Take 650 mg by mouth as needed.      Marland Kitchen albuterol (PROVENTIL HFA;VENTOLIN HFA) 108 (90 Base) MCG/ACT inhaler Inhale 2 puffs into the lungs every 6 (six) hours as needed for wheezing or shortness of breath. 1 Inhaler 12  . aspirin 81 MG EC tablet Take 81 mg by mouth daily. Swallow whole.    Marland Kitchen atorvastatin (LIPITOR) 10 MG tablet TAKE 1 TABLET BY MOUTH EVERY DAY 90 tablet 3  . azithromycin (ZITHROMAX) 250 MG tablet Take 1 tablet (250 mg total)  by mouth daily. 30 each 11  . fluticasone (FLONASE) 50 MCG/ACT nasal spray Place 2 sprays into both nostrils daily. 16 g 6  . Fluticasone-Salmeterol (ADVAIR DISKUS) 100-50 MCG/DOSE AEPB Inhale 1 puff into the lungs 2 (two) times daily. 60 each 12  . furosemide (LASIX) 20 MG tablet Take 2 tablets (40 mg total) by mouth daily. (Patient taking differently: Take 20 mg by mouth daily. ) 30 tablet   . guaiFENesin (MUCINEX) 600 MG 12 hr tablet Take 600 mg by mouth daily.     . potassium chloride (KLOR-CON) 20 MEQ packet Take 20 mEq by mouth daily.     . prednisoLONE acetate (PRED FORTE) 1 % ophthalmic suspension Administer 1 drop into the left eye Four (4) times a day.    . traMADol (ULTRAM) 50 MG tablet Take 1 tablet (50 mg total) by mouth every 6 (six) hours as needed. Takes 2 tablets 4 times daily as needed for pain. 20 tablet 0   No current facility-administered medications for this visit.     OBJECTIVE: Vitals:   08/04/18 1344  BP: (!) 143/70  Pulse: 67  Resp: 18  Temp: 98 F (36.7 C)     Body mass index is 23.49 kg/m.    ECOG FS:0 - Asymptomatic  General: Well-developed, well-nourished, no acute distress. Eyes: Pink conjunctiva, anicteric sclera. HEENT: Normocephalic, moist mucous membranes. Lungs: Clear to auscultation bilaterally. Heart: Regular rate and rhythm. No rubs, murmurs, or gallops. Abdomen: Soft, nontender, nondistended. No organomegaly noted, normoactive bowel sounds. Musculoskeletal: No edema, cyanosis, or clubbing. Neuro: Alert, answering all questions appropriately. Cranial nerves grossly intact. Skin: No rashes or petechiae noted. Psych: Normal affect.  LAB RESULTS:  Lab Results  Component Value Date   NA 134 (L) 08/12/2016   K 3.5 08/12/2016   CL 97 (L) 08/12/2016   CO2 25 08/12/2016   GLUCOSE 92 08/12/2016   BUN 9 08/12/2016   CREATININE 0.86 08/12/2016   CALCIUM 8.9 08/12/2016   PROT 7.4 05/16/2018   ALBUMIN 3.8 05/16/2018   AST 19 05/16/2018   ALT  15 05/16/2018   ALKPHOS 78 05/16/2018   BILITOT 0.9 05/16/2018   GFRNONAA >60 08/12/2016   GFRAA >60 08/12/2016    Lab Results  Component Value Date   WBC 12.2 (H) 08/04/2018   NEUTROABS 10.6 (H) 08/04/2018   HGB 11.3 (L) 08/04/2018   HCT 35.0 (L) 08/04/2018   MCV 90.9 08/04/2018   PLT 316 08/04/2018   Lab Results  Component Value Date   IRON 31 (L) 08/04/2018   TIBC 274 08/04/2018   IRONPCTSAT 11 (L) 08/04/2018   Lab Results  Component Value Date   FERRITIN 482 (H) 08/04/2018     STUDIES: No results found.  ASSESSMENT: Iron deficiency anemia.  PLAN:    1.  Iron deficiency  anemia: Patient's hemoglobin and iron stores remain mildly decreased, but significantly improved.  Previously, the remainder of his laboratory work was either negative or within normal limits.  He does not require additional IV Feraheme today.  Patient last received treatment on April 06, 2018.  Return to clinic in 4 months with repeat laboratory work and further evaluation.   2.  MAC: Resolved continue treatment and follow-up her pulmonology.   3.  Elevated ferritin: Improving.  Likely secondary to acute phase reactant.   4.  Leukocytosis: Mild.  Likely reactive. 5.  Thrombocytosis: Resolved.    Patient expressed understanding and was in agreement with this plan. He also understands that He can call clinic at any time with any questions, concerns, or complaints.    Lloyd Huger, MD   08/05/2018 12:33 PM

## 2018-08-01 ENCOUNTER — Other Ambulatory Visit: Payer: Self-pay | Admitting: *Deleted

## 2018-08-01 DIAGNOSIS — D509 Iron deficiency anemia, unspecified: Secondary | ICD-10-CM

## 2018-08-04 ENCOUNTER — Inpatient Hospital Stay (HOSPITAL_BASED_OUTPATIENT_CLINIC_OR_DEPARTMENT_OTHER): Payer: Medicare Other | Admitting: Oncology

## 2018-08-04 ENCOUNTER — Inpatient Hospital Stay: Payer: Medicare Other

## 2018-08-04 ENCOUNTER — Inpatient Hospital Stay: Payer: Medicare Other | Attending: Oncology

## 2018-08-04 VITALS — BP 143/70 | HR 67 | Temp 98.0°F | Resp 18 | Wt 150.0 lb

## 2018-08-04 DIAGNOSIS — Z85118 Personal history of other malignant neoplasm of bronchus and lung: Secondary | ICD-10-CM

## 2018-08-04 DIAGNOSIS — F17290 Nicotine dependence, other tobacco product, uncomplicated: Secondary | ICD-10-CM

## 2018-08-04 DIAGNOSIS — Z7982 Long term (current) use of aspirin: Secondary | ICD-10-CM | POA: Insufficient documentation

## 2018-08-04 DIAGNOSIS — D72829 Elevated white blood cell count, unspecified: Secondary | ICD-10-CM | POA: Insufficient documentation

## 2018-08-04 DIAGNOSIS — Z79899 Other long term (current) drug therapy: Secondary | ICD-10-CM

## 2018-08-04 DIAGNOSIS — D509 Iron deficiency anemia, unspecified: Secondary | ICD-10-CM | POA: Insufficient documentation

## 2018-08-04 LAB — CBC WITH DIFFERENTIAL/PLATELET
Abs Immature Granulocytes: 0.04 10*3/uL (ref 0.00–0.07)
BASOS ABS: 0.1 10*3/uL (ref 0.0–0.1)
Basophils Relative: 1 %
EOS ABS: 0.2 10*3/uL (ref 0.0–0.5)
EOS PCT: 2 %
HCT: 35 % — ABNORMAL LOW (ref 39.0–52.0)
Hemoglobin: 11.3 g/dL — ABNORMAL LOW (ref 13.0–17.0)
IMMATURE GRANULOCYTES: 0 %
LYMPHS PCT: 5 %
Lymphs Abs: 0.6 10*3/uL — ABNORMAL LOW (ref 0.7–4.0)
MCH: 29.4 pg (ref 26.0–34.0)
MCHC: 32.3 g/dL (ref 30.0–36.0)
MCV: 90.9 fL (ref 80.0–100.0)
Monocytes Absolute: 0.7 10*3/uL (ref 0.1–1.0)
Monocytes Relative: 6 %
NEUTROS PCT: 86 %
NRBC: 0 % (ref 0.0–0.2)
Neutro Abs: 10.6 10*3/uL — ABNORMAL HIGH (ref 1.7–7.7)
Platelets: 316 10*3/uL (ref 150–400)
RBC: 3.85 MIL/uL — ABNORMAL LOW (ref 4.22–5.81)
RDW: 13.6 % (ref 11.5–15.5)
WBC: 12.2 10*3/uL — AB (ref 4.0–10.5)

## 2018-08-04 LAB — IRON AND TIBC
Iron: 31 ug/dL — ABNORMAL LOW (ref 45–182)
SATURATION RATIOS: 11 % — AB (ref 17.9–39.5)
TIBC: 274 ug/dL (ref 250–450)
UIBC: 243 ug/dL

## 2018-08-04 LAB — FERRITIN: Ferritin: 482 ng/mL — ABNORMAL HIGH (ref 24–336)

## 2018-08-04 NOTE — Progress Notes (Signed)
Patient denies any concerns today.  

## 2018-08-24 DIAGNOSIS — H353120 Nonexudative age-related macular degeneration, left eye, stage unspecified: Secondary | ICD-10-CM | POA: Diagnosis not present

## 2018-09-01 DIAGNOSIS — C3490 Malignant neoplasm of unspecified part of unspecified bronchus or lung: Secondary | ICD-10-CM | POA: Diagnosis not present

## 2018-09-01 DIAGNOSIS — I5032 Chronic diastolic (congestive) heart failure: Secondary | ICD-10-CM | POA: Diagnosis not present

## 2018-09-01 DIAGNOSIS — E785 Hyperlipidemia, unspecified: Secondary | ICD-10-CM | POA: Diagnosis not present

## 2018-09-01 DIAGNOSIS — Z79899 Other long term (current) drug therapy: Secondary | ICD-10-CM | POA: Diagnosis not present

## 2018-09-01 DIAGNOSIS — J449 Chronic obstructive pulmonary disease, unspecified: Secondary | ICD-10-CM | POA: Diagnosis not present

## 2018-09-13 ENCOUNTER — Ambulatory Visit (INDEPENDENT_AMBULATORY_CARE_PROVIDER_SITE_OTHER): Payer: Medicare Other | Admitting: Pulmonary Disease

## 2018-09-13 ENCOUNTER — Ambulatory Visit
Admission: RE | Admit: 2018-09-13 | Discharge: 2018-09-13 | Disposition: A | Discharge status: Home / Self Care | Payer: Medicare Other | Source: Ambulatory Visit | Attending: Pulmonary Disease | Admitting: Pulmonary Disease

## 2018-09-13 ENCOUNTER — Encounter: Payer: Self-pay | Admitting: Pulmonary Disease

## 2018-09-13 VITALS — BP 130/70 | HR 99 | Ht 67.0 in | Wt 148.0 lb

## 2018-09-13 DIAGNOSIS — A310 Pulmonary mycobacterial infection: Secondary | ICD-10-CM | POA: Diagnosis not present

## 2018-09-13 DIAGNOSIS — R042 Hemoptysis: Secondary | ICD-10-CM | POA: Insufficient documentation

## 2018-09-13 DIAGNOSIS — Z902 Acquired absence of lung [part of]: Secondary | ICD-10-CM | POA: Diagnosis not present

## 2018-09-13 DIAGNOSIS — J471 Bronchiectasis with (acute) exacerbation: Secondary | ICD-10-CM | POA: Diagnosis not present

## 2018-09-13 DIAGNOSIS — I25118 Atherosclerotic heart disease of native coronary artery with other forms of angina pectoris: Secondary | ICD-10-CM

## 2018-09-13 DIAGNOSIS — Z85118 Personal history of other malignant neoplasm of bronchus and lung: Secondary | ICD-10-CM

## 2018-09-13 DIAGNOSIS — R05 Cough: Secondary | ICD-10-CM | POA: Diagnosis not present

## 2018-09-13 MED ORDER — HYDROCOD POLST-CPM POLST ER 10-8 MG/5ML PO SUER: 5.0000 mL | Freq: Two times a day (BID) | ORAL | 0 refills | Status: DC | PRN

## 2018-09-13 MED ORDER — PREDNISONE 20 MG PO TABS: 40.0000 mg | ORAL_TABLET | Freq: Every day | ORAL | 0 refills | Status: AC

## 2018-09-13 MED ORDER — LEVOFLOXACIN 500 MG PO TABS: 500.0000 mg | ORAL_TABLET | Freq: Every day | ORAL | 0 refills | Status: AC

## 2018-09-13 NOTE — Patient Instructions (Addendum)
Chest Xray today Prednisone 40 mg (2X 20 mg) daily X 5 days Levofloxacin 500 mg daily for 7 days Hold azithromycin while on levofloxacin Hold (do not use) chest percussion vest until seen in follow-up If bleeding gets significantly worse or if you are breathing is compromised, call EMS and go immediately to the emergency department Follow-up Friday 12/20 at 915.  Call sooner if needed

## 2018-09-13 NOTE — Progress Notes (Signed)
PULMONARY OFFICE FOLLOW UP  PROBLEMS: 1) Severe COPD 2) Pulmonary MAC - he has been intolerant to attempts @ Rx 3) Bronchiectasis 4) H/O lung cancer - S/P LLL resection 2000 5) Former smoker - quit 1995  INTERVAL HISTORY: No major events  SUBJ: This is an acute pulmonary office visit for hemoptysis.  He states that he has had hemoptysis for the past 2 days.  He brings in a cup with bloody paper towels.  He states that over the past 2 days he is coughed up approximately 2 to 4 ounces of blood.  He has mild increase in shortness of breath.  He denies pleuritic chest pain.  He denies fever.  He denies lower extremity edema.  He has been compliant with all of his previously prescribed medical therapies including azithromycin daily, Advair and chest percussion.  He feels that he has benefited from chronic azithromycin and from chest percussion. He continues to use the chest percussion vest compliantly and continues to benefit from it.    OBJ: Vitals:   09/13/18 1020  BP: 130/70  Pulse: 99  SpO2: 96%  Weight: 148 lb (67.1 kg)  Height: 5' 7"  (1.702 m)  RA  Gen: No overt distress HEENT: NCAT, sclera white Neck: No JVD Lungs: breath sounds full with no wheezes.  Minimal right basilar crackles Cardiovascular: RRR, no murmurs Abdomen: Soft, nontender, normal BS Ext: without clubbing, cyanosis, edema Neuro: grossly intact Skin: Limited exam, no lesions noted   DATA: BMP Latest Ref Rng & Units 08/12/2016 08/04/2016 07/29/2016  Glucose 65 - 99 mg/dL 92 107(H) 103(H)  BUN 6 - 20 mg/dL 9 15 21(H)  Creatinine 0.61 - 1.24 mg/dL 0.86 0.81 1.01  Sodium 135 - 145 mmol/L 134(L) 136 141  Potassium 3.5 - 5.1 mmol/L 3.5 2.9(L) 3.3(L)  Chloride 101 - 111 mmol/L 97(L) 94(L) 100(L)  CO2 22 - 32 mmol/L 25 29 29   Calcium 8.9 - 10.3 mg/dL 8.9 8.6(L) 8.9    CBC Latest Ref Rng & Units 08/04/2018 03/29/2018 11/16/2017  WBC 4.0 - 10.5 K/uL 12.2(H) 13.1(H) 13.5(H)  Hemoglobin 13.0 - 17.0 g/dL 11.3(L) 10.7(L)  12.0(L)  Hematocrit 39.0 - 52.0 % 35.0(L) 32.1(L) 36.2(L)  Platelets 150 - 400 K/uL 316 486(H) 398    CXR: No new film  IMPRESSION: 1) Severe COPD 2) S/P LLL resection for lung cancer 2000 3) former smoker 4) severe bronchiectasis due to pulmonary MAC. Previously intolerant to combination abx therapy 5) Mild-moderate hemoptysis due to acute bronchiectasis exacerbation   PLAN: Chest Xray today Prednisone 40 mg (2X 20 mg) daily X 5 days Levofloxacin 500 mg daily for 7 days Hold azithromycin while on levofloxacin Hold (do not use) chest percussion vest until seen in follow-up Aggressive cough suppression over next 3 to 4 days with Tussionex (prescription entered) He is instructed that if bleeding gets significantly worse or if his breathing is compromised, he is to call EMS and go immediately to the emergency department Follow-up Friday 12/20 at 915.  Call sooner if needed  Merton Border, MD PCCM service Mobile [phone] Pager [phone] 09/13/2018 10:36 AM

## 2018-09-16 ENCOUNTER — Ambulatory Visit (INDEPENDENT_AMBULATORY_CARE_PROVIDER_SITE_OTHER): Payer: Medicare Other | Admitting: Pulmonary Disease

## 2018-09-16 ENCOUNTER — Encounter: Payer: Self-pay | Admitting: Pulmonary Disease

## 2018-09-16 VITALS — BP 140/70 | HR 99 | Resp 16 | Ht 67.0 in | Wt 148.0 lb

## 2018-09-16 DIAGNOSIS — J471 Bronchiectasis with (acute) exacerbation: Secondary | ICD-10-CM | POA: Diagnosis not present

## 2018-09-16 DIAGNOSIS — Z87891 Personal history of nicotine dependence: Secondary | ICD-10-CM | POA: Diagnosis not present

## 2018-09-16 DIAGNOSIS — R042 Hemoptysis: Secondary | ICD-10-CM

## 2018-09-16 DIAGNOSIS — I25118 Atherosclerotic heart disease of native coronary artery with other forms of angina pectoris: Secondary | ICD-10-CM

## 2018-09-16 DIAGNOSIS — J449 Chronic obstructive pulmonary disease, unspecified: Secondary | ICD-10-CM

## 2018-09-16 DIAGNOSIS — Z902 Acquired absence of lung [part of]: Secondary | ICD-10-CM

## 2018-09-16 MED ORDER — HYDROCOD POLST-CPM POLST ER 10-8 MG/5ML PO SUER: 5.0000 mL | Freq: Two times a day (BID) | ORAL | 0 refills | Status: DC | PRN

## 2018-09-16 NOTE — Progress Notes (Addendum)
PULMONARY OFFICE FOLLOW UP  PROBLEMS: 1) Severe COPD 2) Pulmonary MAC - he has been intolerant to attempts @ Rx 3) Bronchiectasis 4) H/O lung cancer - S/P LLL resection 2000 5) Former smoker - quit 1995  INTERVAL HISTORY: Last seen 12/17 with acute exacerbation of bronchiectasis and hemoptysis.   SUBJ: This is a quick recheck after recent eval for exacerbation of bronchiectasis complicated by hemoptysis.  I treated him with levofloxacin, prednisone, cough suppression and recommended that he hold off on chest percussion vest for now.    He continues to use the chest percussion vest compliantly and continues to benefit from it.    OBJ: Vitals:   09/16/18 0911 09/16/18 0914  BP:  140/70  Pulse:  99  Resp: 16   SpO2:  100%  Weight: 148 lb (67.1 kg)   Height: _0  (1.702 m)   RA  Gen: No overt distress HEENT: NCAT, sclerae white Neck: No JVD, no LN Lungs: Bibasilar crackles, no wheezes Cardiovascular: RRR, no M Abdomen: NT, NABS Ext: No C/C/E Neuro: No focal deficits on limited exam Skin: Limited exam, no lesions noted   DATA: BMP Latest Ref Rng & Units 08/12/2016 08/04/2016 07/29/2016  Glucose 65 - 99 mg/dL 92 107(H) 103(H)  BUN 6 - 20 mg/dL 9 15 21(H)  Creatinine 0.61 - 1.24 mg/dL 0.86 0.81 1.01  Sodium 135 - 145 mmol/L 134(L) 136 141  Potassium 3.5 - 5.1 mmol/L 3.5 2.9(L) 3.3(L)  Chloride 101 - 111 mmol/L 97(L) 94(L) 100(L)  CO2 22 - 32 mmol/L _1 Calcium 8.9 - 10.3 mg/dL 8.9 8.6(L) 8.9    CBC Latest Ref Rng & Units 08/04/2018 03/29/2018 11/16/2017  WBC 4.0 - 10.5 K/uL 12.2(H) 13.1(H) 13.5(H)  Hemoglobin 13.0 - 17.0 g/dL 11.3(L) 10.7(L) 12.0(L)  Hematocrit 39.0 - 52.0 % 35.0(L) 32.1(L) 36.2(L)  Platelets 150 - 400 K/uL 316 486(H) 398    CXR 12/17: Increasing size of air-fluid level of the left lung base may represent infected bleb, but abscess can not be excluded. With multiple surrounding lucencies, recommend CT of the chest to evaluate more sensitively.  Somewhat more prominent markings anteriorly in the region of the anterior inferior left upper lobe or lingula may represent an active inflammatory component.(My interpretation: the increased AF level probably represents blood pooled in a chronic cavity/bleb)  I have personally reviewed all chest radiographs including chest x-rays and CT scan unless otherwise indicated  IMPRESSION: 1) Severe COPD 2) S/P LLL resection for lung cancer 2000 3) former smoker 4) severe bronchiectasis due to pulmonary MAC. Previously intolerant to combination abx therapy 5) Mild-moderate hemoptysis due to acute bronchiectasis exacerbation   PLAN: Complete Prednisone 40 mg (2X 20 mg) daily X 5 days as previously prescribed Complete levofloxacin 500 mg daily for 7 days as previously prescribed Resume azithromycin after levofloxacin course completed Resume chest percussion vest twice a day after completion of prednisone and levofloxacin Continue Tussionex as needed for cough suppression  Follow-up in 2-4 weeks with repeat CXR at that time  Merton Border, MD PCCM service Mobile [phone] Pager [phone] 09/16/2018 9:18 AM

## 2018-09-16 NOTE — Patient Instructions (Addendum)
Complete prednisone as previously prescribed Complete levofloxacin as previously prescribed After levofloxacin course has been completed, resume azithromycin as previously prescribed Resume chest percussion Monday 12/23 if there is no further bleeding Continue Tussionex as needed for cough suppression.  New prescription entered Continue Advair 100/50, 1 inhalation twice a day.  We might increase the dose of this in the future Follow-up in 2-4 weeks with repeat chest x-ray prior to that visit Call sooner as needed

## 2018-09-29 DIAGNOSIS — H2012 Chronic iridocyclitis, left eye: Secondary | ICD-10-CM | POA: Diagnosis not present

## 2018-09-29 DIAGNOSIS — Z961 Presence of intraocular lens: Secondary | ICD-10-CM | POA: Diagnosis not present

## 2018-09-29 DIAGNOSIS — Z947 Corneal transplant status: Secondary | ICD-10-CM | POA: Diagnosis not present

## 2018-09-30 ENCOUNTER — Ambulatory Visit
Admission: RE | Admit: 2018-09-30 | Discharge: 2018-09-30 | Disposition: A | Discharge status: Home / Self Care | Payer: Medicare Other | Source: Ambulatory Visit | Attending: Pulmonary Disease | Admitting: Pulmonary Disease

## 2018-09-30 ENCOUNTER — Encounter: Payer: Self-pay | Admitting: Pulmonary Disease

## 2018-09-30 ENCOUNTER — Ambulatory Visit
Admission: RE | Admit: 2018-09-30 | Discharge: 2018-09-30 | Disposition: A | Discharge status: Home / Self Care | Payer: Medicare Other | Attending: Pulmonary Disease | Admitting: Pulmonary Disease

## 2018-09-30 ENCOUNTER — Ambulatory Visit (INDEPENDENT_AMBULATORY_CARE_PROVIDER_SITE_OTHER): Payer: Medicare Other | Admitting: Pulmonary Disease

## 2018-09-30 VITALS — Resp 16 | Ht 67.0 in | Wt 149.0 lb

## 2018-09-30 DIAGNOSIS — R042 Hemoptysis: Secondary | ICD-10-CM

## 2018-09-30 DIAGNOSIS — R918 Other nonspecific abnormal finding of lung field: Secondary | ICD-10-CM | POA: Diagnosis not present

## 2018-09-30 DIAGNOSIS — J471 Bronchiectasis with (acute) exacerbation: Secondary | ICD-10-CM

## 2018-09-30 MED ORDER — LEVOFLOXACIN 500 MG PO TABS: 500.0000 mg | ORAL_TABLET | Freq: Every day | ORAL | 0 refills | Status: DC

## 2018-09-30 NOTE — Patient Instructions (Signed)
Another round of levofloxacin 500 mg daily for 7 days Stop azithromycin altogether Follow-up in 6 weeks.  At that time we will discuss initiating a schedule of antibiotics for the first week of every month

## 2018-10-02 NOTE — Progress Notes (Signed)
PULMONARY OFFICE FOLLOW UP  PROBLEMS: 1) Severe COPD 2) Pulmonary MAC - he has been intolerant to attempts @ Rx 3) Bronchiectasis 4) H/O lung cancer - S/P LLL resection 2000 5) Former smoker - quit 1995  INTERVAL HISTORY: Last seen 12/20 in follow up foracute exacerbation of bronchiectasis and hemoptysis.   SUBJ: This is a scheduled follow-up.  He has no further hemoptysis.  Overall he feels better.  However, he continues to have cough productive of moderate purulent sputum.  He is using chest percussion vest again.  He has completed levofloxacin as previously prescribed.  He is back on azithromycin.  However, we have not been able to demonstrate significant benefit from chronic azithromycin use.  He remains on Advair 100/50, one inhalation twice a day.  He continues to use the chest percussion vest compliantly and continues to benefit from it.    OBJ: Vitals:   09/30/18 1122  Resp: 16  Weight: 149 lb (67.6 kg)  Height: _0  (1.702 m)  RA  Gen: No overt distress HEENT: NCAT, sclerae white Neck: Supple without JVD, lymphadenopathy Lungs: No wheezes, bibasilar crackles Cardiovascular: RRR, no M Abdomen: Soft, NT, NABS Ext: No clubbing, cyanosis, edema Neuro: No focal deficits noted Skin: Limited exam, no lesions noted   DATA: BMP Latest Ref Rng & Units 08/12/2016 08/04/2016 07/29/2016  Glucose 65 - 99 mg/dL 92 107(H) 103(H)  BUN 6 - 20 mg/dL 9 15 21(H)  Creatinine 0.61 - 1.24 mg/dL 0.86 0.81 1.01  Sodium 135 - 145 mmol/L 134(L) 136 141  Potassium 3.5 - 5.1 mmol/L 3.5 2.9(L) 3.3(L)  Chloride 101 - 111 mmol/L 97(L) 94(L) 100(L)  CO2 22 - 32 mmol/L _1 Calcium 8.9 - 10.3 mg/dL 8.9 8.6(L) 8.9    CBC Latest Ref Rng & Units 08/04/2018 03/29/2018 11/16/2017  WBC 4.0 - 10.5 K/uL 12.2(H) 13.1(H) 13.5(H)  Hemoglobin 13.0 - 17.0 g/dL 11.3(L) 10.7(L) 12.0(L)  Hematocrit 39.0 - 52.0 % 35.0(L) 32.1(L) 36.2(L)  Platelets 150 - 400 K/uL 316 486(H) 398    CXR : Chronic cavity in  LLL with air-fluid level which appears to be reduced in volume.  Rest of chronic changes unchanged  I have personally reviewed all chest radiographs including chest x-rays and CT scan unless otherwise indicated  IMPRESSION: 1) Severe COPD 2) S/P LLL resection for lung cancer 2000 3) former smoker 4) severe bronchiectasis due to pulmonary MAC. Previously intolerant to combination abx therapy 5) hemoptysis, resolved 6) refractory exacerbation of bronchiectasis with persistent purulent mucus   PLAN: We are going to go with another round of levofloxacin 500 mg daily for 7 days  Continue Advair inhaler  Continue chest percussion vest 15-30 minutes twice daily  Stop azithromycin altogether  Follow-up in 6 weeks.  At that time we will discuss the possibility of initiating a schedule of antibiotics during the first week of every month.  Alternatively, we could seek approval for nebulized tobramycin.  If we are to do this, a sputum culture might be warranted  Merton Border, MD PCCM service Mobile [phone] Pager [phone] 10/02/2018 2:28 PM

## 2018-11-04 DIAGNOSIS — E785 Hyperlipidemia, unspecified: Secondary | ICD-10-CM | POA: Diagnosis not present

## 2018-11-04 DIAGNOSIS — I5032 Chronic diastolic (congestive) heart failure: Secondary | ICD-10-CM | POA: Diagnosis not present

## 2018-11-15 ENCOUNTER — Ambulatory Visit: Payer: Medicare Other | Admitting: Pulmonary Disease

## 2018-11-23 ENCOUNTER — Ambulatory Visit (INDEPENDENT_AMBULATORY_CARE_PROVIDER_SITE_OTHER): Payer: Medicare Other | Admitting: Pulmonary Disease

## 2018-11-23 ENCOUNTER — Encounter: Payer: Self-pay | Admitting: Pulmonary Disease

## 2018-11-23 VITALS — BP 136/80 | HR 100 | Ht 67.0 in

## 2018-11-23 DIAGNOSIS — A310 Pulmonary mycobacterial infection: Secondary | ICD-10-CM

## 2018-11-23 DIAGNOSIS — J449 Chronic obstructive pulmonary disease, unspecified: Secondary | ICD-10-CM

## 2018-11-23 DIAGNOSIS — J479 Bronchiectasis, uncomplicated: Secondary | ICD-10-CM | POA: Diagnosis not present

## 2018-11-23 MED ORDER — ALBUTEROL SULFATE (2.5 MG/3ML) 0.083% IN NEBU: 2.5000 mg | INHALATION_SOLUTION | Freq: Two times a day (BID) | RESPIRATORY_TRACT | 12 refills | Status: DC

## 2018-11-23 MED ORDER — ALBUTEROL SULFATE (2.5 MG/3ML) 0.083% IN NEBU: 2.5000 mg | INHALATION_SOLUTION | Freq: Two times a day (BID) | RESPIRATORY_TRACT | 3 refills | Status: AC

## 2018-11-23 NOTE — Patient Instructions (Signed)
Continue Advair inhaler, 1 inhalation twice a day.  Rinse mouth after use Continue chest percussion vest 15-30 minutes twice a day   New prescription: Albuterol nebulizer solution, use twice a day during chest percussion treatments.  May also be used up to every 6 hours as needed for increased shortness of breath, wheezing, chest tightness, cough  Sputum specimen for AFB smear and culture (to look for persistent MAC infection)  Follow-up in 6-8 weeks.  Call sooner if needed

## 2018-11-23 NOTE — Progress Notes (Signed)
PULMONARY OFFICE FOLLOW UP  PROBLEMS: 1) Severe COPD 2) Pulmonary MAC - he has been intolerant to attempts @ Rx 3) Bronchiectasis 4) H/O lung cancer - S/P LLL resection 2000 5) Former smoker - quit 1995  INTERVAL HISTORY: Last seen 01/03 in follow up foracute exacerbation of bronchiectasis and hemoptysis.  At that time, another round of levofloxacin was prescribed.  He was instructed to resume chest percussion  SUBJ: This is a scheduled follow-up.  Overall, he is approaching his baseline.  He continues to have severe exertional dyspnea.  He is using chest percussion and after he uses it he is able to mobilize some respiratory secretions.  He does have mild chronic cough which is mostly nonproductive.  He is using Advair as previously prescribed.  He rarely uses albuterol rescue inhaler.  He denies CP, fever, further hemoptysis, LE edema and calf tenderness.  He continues to use the chest percussion vest compliantly and continues to benefit from it.    OBJ: Vitals:   11/23/18 1008 11/23/18 1017  BP:  136/80  Pulse:  100  SpO2:  92%  Height: 5' 7"  (1.702 m)   RA  Gen: NAD HEENT: NCAT, sclerae white Neck: Supple without adenopathy Lungs: No wheezes, faint bibasilar crackles Cardiovascular: Regular, no murmur Abdomen: Soft, NT, NABS Ext: No C/C/E Neuro: No focal deficit Skin: Limited exam, no lesions noted   DATA: BMP Latest Ref Rng & Units 08/12/2016 08/04/2016 07/29/2016  Glucose 65 - 99 mg/dL 92 107(H) 103(H)  BUN 6 - 20 mg/dL 9 15 21(H)  Creatinine 0.61 - 1.24 mg/dL 0.86 0.81 1.01  Sodium 135 - 145 mmol/L 134(L) 136 141  Potassium 3.5 - 5.1 mmol/L 3.5 2.9(L) 3.3(L)  Chloride 101 - 111 mmol/L 97(L) 94(L) 100(L)  CO2 22 - 32 mmol/L 25 29 29   Calcium 8.9 - 10.3 mg/dL 8.9 8.6(L) 8.9    CBC Latest Ref Rng & Units 08/04/2018 03/29/2018 11/16/2017  WBC 4.0 - 10.5 K/uL 12.2(H) 13.1(H) 13.5(H)  Hemoglobin 13.0 - 17.0 g/dL 11.3(L) 10.7(L) 12.0(L)  Hematocrit 39.0 - 52.0 % 35.0(L)  32.1(L) 36.2(L)  Platelets 150 - 400 K/uL 316 486(H) 398    CXR : No new film  I have personally reviewed all chest radiographs including chest x-rays and CT scan unless otherwise indicated  IMPRESSION: 1) Severe COPD 2) S/P LLL resection for lung cancer 2000 3) former smoker 4) severe bronchiectasis due to pulmonary MAC. Previously intolerant to combination abx therapy    PLAN: 1) continue Advair inhaler, 1 inhalation twice daily.  Rinse mouth after use 2) continue chest percussion vest 15-30 minutes twice daily 3) new prescription: Nebulized albuterol 2.5 mg.  I instructed him to nebulizer treatment twice a day while performing chest percussion treatment.  He may also use albuterol as needed up to every 6 hours for shortness of breath, wheezing, chest tightness, cough 4) I have ordered sputum for AFB smear and culture to look for persistent pulmonary MAC infection.  If present, we can consider initiating nebulized tobramycin 5) follow-up in 6-8 weeks.  Call sooner if needed  Merton Border, MD PCCM service Mobile [phone] Pager [phone] 11/23/2018 1:16 PM

## 2018-11-25 ENCOUNTER — Other Ambulatory Visit
Admission: RE | Admit: 2018-11-25 | Discharge: 2018-11-25 | Disposition: A | Discharge status: Home / Self Care | Payer: Medicare Other | Source: Ambulatory Visit | Attending: Pulmonary Disease | Admitting: Pulmonary Disease

## 2018-11-25 DIAGNOSIS — A310 Pulmonary mycobacterial infection: Secondary | ICD-10-CM | POA: Diagnosis not present

## 2018-11-25 LAB — EXPECTORATED SPUTUM ASSESSMENT W GRAM STAIN, RFLX TO RESP C

## 2018-11-25 LAB — EXPECTORATED SPUTUM ASSESSMENT W REFEX TO RESP CULTURE

## 2018-11-25 NOTE — Addendum Note (Signed)
Addended by: Darreld Mclean on: 11/25/2018 10:05 AM   Modules accepted: Orders

## 2018-11-26 LAB — ACID FAST SMEAR (AFB, MYCOBACTERIA): Acid Fast Smear: POSITIVE — AB

## 2018-11-26 LAB — ACID FAST SMEAR (AFB)

## 2018-11-28 ENCOUNTER — Telehealth: Payer: Self-pay

## 2018-11-28 DIAGNOSIS — A310 Pulmonary mycobacterial infection: Secondary | ICD-10-CM

## 2018-11-28 LAB — CULTURE, RESPIRATORY W GRAM STAIN: Culture: NORMAL

## 2018-11-28 LAB — CULTURE, RESPIRATORY

## 2018-11-28 NOTE — Telephone Encounter (Signed)
Spoke to patient's wife regarding the AFB results. Let her know we are going to refer to ID and see how they suggest proceeding. Referral entered.

## 2018-11-28 NOTE — Telephone Encounter (Signed)
-----   Message from Wilhelmina Mcardle, MD sent at 11/27/2018  3:07 PM EST ----- Let him know that his sputum AFB smear continues to show a high count of MAC (Mycobacterium avium-intracellulare complex). If he is agreeable, I would like to see ID for an opinion on if/how to treat this beyond what we are already doing.   Thanks  [name]

## 2018-11-29 ENCOUNTER — Ambulatory Visit: Payer: Medicare Other | Attending: Infectious Diseases | Admitting: Infectious Diseases

## 2018-11-29 ENCOUNTER — Encounter: Payer: Self-pay | Admitting: Infectious Diseases

## 2018-11-29 VITALS — BP 157/87 | HR 95 | Temp 97.9°F | Ht 67.0 in | Wt 148.4 lb

## 2018-11-29 DIAGNOSIS — Z902 Acquired absence of lung [part of]: Secondary | ICD-10-CM

## 2018-11-29 DIAGNOSIS — Z881 Allergy status to other antibiotic agents status: Secondary | ICD-10-CM

## 2018-11-29 DIAGNOSIS — J449 Chronic obstructive pulmonary disease, unspecified: Secondary | ICD-10-CM | POA: Diagnosis not present

## 2018-11-29 DIAGNOSIS — Z87891 Personal history of nicotine dependence: Secondary | ICD-10-CM

## 2018-11-29 DIAGNOSIS — Z885 Allergy status to narcotic agent status: Secondary | ICD-10-CM

## 2018-11-29 DIAGNOSIS — A310 Pulmonary mycobacterial infection: Secondary | ICD-10-CM | POA: Diagnosis not present

## 2018-11-29 DIAGNOSIS — Z85110 Personal history of malignant carcinoid tumor of bronchus and lung: Secondary | ICD-10-CM

## 2018-11-29 NOTE — Progress Notes (Signed)
NAME: [name]  DOB: 1946/04/28  MRN: [id number]  Date/Time: 11/29/2018 9:41 AM  REQUESTING PROVIDER:Dr.Simonds Subjective:  REASON FOR CONSULT: Pulmonary MAC ? [name] is a 73 y.o. male  with a history of Small cell cancer of the lung 20 yrs ago and had partial left lower lobectomy/ COPD Diagnosed with pulmonary MAC sept 2017 .Started triple treatment 06/26/16 for MAC with Dr Tommy Medal. He was admitted 07/20/16 with increasing edema, hyponatremia, elevated cr and was seen by Dr.Fitzgerald and meds were put on hold and he saw him as OP on Dec 2017 and was asked to restart MAC treatment but patient has been worried and did not take any meds. ( rifampin 671m, Aithromycin 5048m Ethambutol) He is currently followed by pulmonologist Dr.Simonds for COPD/ bronchiectasis and in Dec 2019 he had hempotysis and was treated with antibiotics ( azithro) and he felt better and it resolved- Last CXR from Jan 2020. Dr.Simonds wants to try nebulized tobramycin and wants to get my opinion. Pt has no fever or chills or hemoptysis now. Has sob on exertion- says his resp symptoms have gotten worse since 2017  Past Medical History:  Diagnosis Date  . [facility]   [facility]  . Arthritis of lumbar spine   . Asthma   . Clotting disorder ([facility]  . Collapsed lung    x3  . COPD (chronic obstructive pulmonary disease) (HCR[place]  . Dyspnea   . History of benign thyroid tumor   . Lung cancer ([facility]  . Mycobacterium avium complex (HCDowning9/27/2017  . Pneumonia   . Pneumothorax   . Small cell carcinoma of lung ([place]  . Spontaneous pneumothorax     Past Surgical History:  Procedure Laterality Date  . APPENDECTOMY    . benign thryoid nodule resected    . CARDIAC CATHETERIZATION  2013   [facility]  . CHEST TUBE INSERTION    . COLONOSCOPY N/A 08/30/2015   Procedure: COLONOSCOPY;  Surgeon: NaRogene HoustonMD;  Location: [facility];  Service: Endoscopy;  Laterality: N/A;  1:45  . left upper lobectomy for small  cell lung cancer    . VATS R thoracotomy      Social History   Socioeconomic History  . Marital status: Married    Spouse name: Not on file  . Number of children: 1  . Years of education: Not on file  . Highest education level: Not on file  Occupational History  . 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 Financial resource strain: Not on file  . Food insecurity:    Worry: Not on file    Inability: Not on file  . Transportation needs:    Medical: Not on file    Non-medical: Not on file  Tobacco Use  . Smoking status: Former Smoker    Packs/day: 3.00    Years: 30.00    Pack years: 90.00    Types: Cigars    Last attempt to quit: 03/02/1992    Years since quitting: 26.7  . Smokeless tobacco: Never Used  . Tobacco comment: quit 22 yrs ago  Substance and Sexual Activity  . Alcohol use: No    Alcohol/week: 0.0 standard drinks  . Drug use: No  . Sexual activity: Yes  Lifestyle  . Physical activity:    Days per week: Not on file    Minutes per session: Not on file  . Stress: Not  on file  Relationships  . Social connections:    Talks on phone: Not on file    Gets together: Not on file    Attends religious service: Not on file    Active member of club or organization: Not on file    Attends meetings of clubs or organizations: Not on file    Relationship status: Not on file  . Intimate partner violence:    Fear of current or ex partner: Not on file    Emotionally abused: Not on file    Physically abused: Not on file    Forced sexual activity: Not on file  Other Topics Concern  . Not on file  Social History Narrative  . Not on file    Family History  Problem Relation Age of Onset  . Heart disease Mother   . Colon cancer Father   . Heart failure Brother    Allergies  Allergen Reactions  . Neomy-Bacit-Polymyx-Pramoxine Anaphylaxis  . Other Other (See Comments)    Hydromet cough syrup-causes GI upset  . Codeine Nausea And Vomiting     ? Current Outpatient Medications  Medication Sig Dispense Refill  . acetaminophen (TYLENOL) 325 MG tablet Take 650 mg by mouth as needed.      Marland Kitchen albuterol (PROVENTIL HFA;VENTOLIN HFA) 108 (90 Base) MCG/ACT inhaler Inhale 2 puffs into the lungs every 6 (six) hours as needed for wheezing or shortness of breath. 1 Inhaler 12  . albuterol (PROVENTIL) (2.5 MG/3ML) 0.083% nebulizer solution Take 3 mLs (2.5 mg total) by nebulization 2 (two) times daily. With chest percussion treatment. May also use every 6 hrs as needed for increased shortness of breath, wheezing, chest tightness 360 mL 3  . aspirin 81 MG EC tablet Take 81 mg by mouth daily. Swallow whole.    Marland Kitchen atorvastatin (LIPITOR) 10 MG tablet TAKE 1 TABLET BY MOUTH EVERY DAY 90 tablet 3  . Fluticasone-Salmeterol (ADVAIR DISKUS) 100-50 MCG/DOSE AEPB Inhale 1 puff into the lungs 2 (two) times daily. 60 each 12  . furosemide (LASIX) 20 MG tablet Take 20 mg by mouth.    Marland Kitchen guaiFENesin (MUCINEX) 600 MG 12 hr tablet Take 600 mg by mouth daily.     . potassium chloride (KLOR-CON) 20 MEQ packet Take 20 mEq by mouth daily.     . prednisoLONE acetate (PRED FORTE) 1 % ophthalmic suspension Administer 1 drop into the left eye Four (4) times a day.    . traMADol (ULTRAM) 50 MG tablet Take 1 tablet (50 mg total) by mouth every 6 (six) hours as needed. Takes 2 tablets 4 times daily as needed for pain. 20 tablet 0   No current facility-administered medications for this visit.      Abtx:  Anti-infectives (From admission, onward)   None      REVIEW OF SYSTEMS:  Const: negative fever, negative chills, some weight loss Eyes: poor vision - has had corneal transplant ENT: negative coryza, negative sore throat Resp:  cough, hemoptysis, dyspnea Cards: left sided crampy chest pain, No palpitations, lower extremity edema GU: negative for frequency, dysuria and hematuria GI: Negative for abdominal pain, diarrhea, bleeding, constipation Skin: negative for rash  and pruritus Heme: negative for easy bruising and gum/nose bleeding MS: negative for myalgias, arthralgias, back pain and muscle weakness Neurolo:negative for headaches, dizziness, vertigo, memory problems  Psych: negative for feelings of anxiety,  No polyuria or polydipsia Allergy/Immunology- as above ?  Objective:  VITALS:  BP (!) 157/87 (BP Location: Left Arm, Patient  Position: Sitting, Cuff Size: Normal)   Pulse 95   Temp 97.9 F (36.6 C) (Oral)   Ht 5' 7"  (1.702 m)   Wt 148 lb 6 oz (67.3 kg)   BMI 23.24 kg/m  PHYSICAL EXAM:  General: Alert, cooperative, no distress, appears stated age.  Head: Normocephalic, without obvious abnormality, atraumatic. Eyes: unequal pupils ENT Nares normal. No drainage or sinus tenderness. Lips, mucosa, and tongue normal. No Thrush Neck: Supple, symmetrical, no adenopathy, thyroid: non tender no carotid bruit and no JVD. Back: No CVA tenderness. Lungs: b/la ir entry- crepts bases  Heart: s1s2 Abdomen:not examined Extremities: atraumatic, no cyanosis. No edema. No clubbing Skin:limited examination Lymph: Cervical, supraclavicular normal. Neurologic: Grossly non-focal Pertinent Labs Lab Results CBC No recent labs No recent labs   Microbiology: Recent Results (from the past 240 hour(s))  Expectorated sputum assessment w rflx to resp cult     Status: None   Collection Time: 11/25/18  9:51 AM  Result Value Ref Range Status   Specimen Description SPUTUM  Final   Special Requests NONE  Final   Sputum evaluation   Final    THIS SPECIMEN IS ACCEPTABLE FOR SPUTUM CULTURE Performed at [facility], [address] [place], [place] [address]    Report Status 11/25/2018 FINAL  Final  Acid Fast Smear (AFB)     Status: Abnormal   Collection Time: 11/25/18  9:51 AM  Result Value Ref Range Status   AFB Specimen Processing Concentration  Final   Acid Fast Smear Positive (A)  Final    Comment: (NOTE) 4+, more than 36 acid-fast  bacilli per field at 400X magnification, fluorescent smear REPORTED/ FAXED TO STEPHON CAPERS 02.29.20 AT 1715 - [facility] Performed At: [facility] [place], [place] [id number] Rush Farmer MD KW:[id number]    Source (AFB) SPUTUM  Final    Comment: Performed at [facility], [address] [place], [place] [address]  Culture, respiratory     Status: None   Collection Time: 11/25/18  9:51 AM  Result Value Ref Range Status   Specimen Description   Final    SPUTUM Performed at [facility], [address], [place], [place] [address]    Special Requests   Final    NONE Reflexed from [id number] Performed at [facility], [address] [place], [place] [address]    Gram Stain   Final    MODERATE WBC PRESENT, PREDOMINANTLY PMN MODERATE GRAM POSITIVE COCCI MODERATE GRAM POSITIVE RODS MODERATE GRAM VARIABLE ROD    Culture   Final    MODERATE Consistent with normal respiratory flora. Performed at [facility], [place] [address] [place], [place] [address]    Report Status 11/28/2018 FINAL  Final    IMAGING RESULTS: Dec 2020   Jan 2020  Persistent thick walled cavitary lesion in the left lower lobe. Some slight increase in left pleural effusion is noted as well as some increase in the degree of wall thickening of smaller cavitary lesions likely representing some progressive inflammatory change.    I have personally reviewed the films ? Impression/Recommendation ?73 y.o. male  with a history of Small cell cancer of the lung 20 yrs ago and had partial left lower lobectomy/ COPD, Left lower lobe cavity, abscess, MAC infection unable to tolerate MAC treatment in 2017 Pulmonary MAC- pt was not able to tolerate Azithromycin, ethambutol and rifabutin in 2017- not on ay tretment now Being treated for COPD/bronchiectasis Recent sputum 4+ afb  Recommend  1) CT scan lungs to compare with 2017 Nebulized aminoglycoside on its own may   not work for [place]- I have no experience in treating MAC with nebulized tobramycin Discussed the treatment options of going on Triple oral therapy but replacing rifampin with rifabutin along with azithro and ethambutol  Pt and wife will think about it Gave them material on 3 drugs Will need baseline CBC with diff, LFT, BMP if he wants to start treatment Discussed with Dr.Simonds as well

## 2018-11-29 NOTE — Patient Instructions (Addendum)
You are here to get an opinion on pulmonary MAC which you were diagnosed in sept 2017, was given triple therapy with azithromycin, rifampin and ethambutol which you were able to take for a month and you stopped because of dehydration,diarrhea intolerance for which you were admitted to the hospital. You stopped the meds since then You are seieng Dr.Simonds and he wanted tot ry nebulized tobramycin which alone may not help MAC but could for bronchiectasis ( if you have repeated bacteria cultured from the sputum) Recommend repeating CT chest to define all the changes in the lung compared to 2017. Also getting the susceptibility of MAC as some of the meds may have become reisstant. You could go on azithromycin 253m + rifabutin 3068m ethambutol 90058mWill Discuss with your pulmonary doctor

## 2018-12-04 NOTE — Progress Notes (Signed)
[place]  Telephone:(336) [phone] Fax:(336) [phone]  ID: [name] OB: 08-27-1946  MR#: [id number]  YWV#:[id number]  Patient Care Team: Asencion Noble, MD as PCP - General (Internal Medicine) Minna Merritts, MD (Cardiology)  CHIEF COMPLAINT:  Iron deficiency anemia.  INTERVAL HISTORY: Patient returns to clinic today for repeat laboratory and further evaluation.  He continues to have chronic weakness and fatigue, but otherwise feels well. He has no neurologic complaints.  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  He denies any chest pain, cough, hemoptysis, or shortness of breath.  He has a good appetite and denies weight loss.  He denies any nausea, vomiting, constipation, or diarrhea.  He denies any melena or hematochezia.  He has no urinary complaints.  Patient offers no further specific complaints today.  REVIEW OF SYSTEMS:   Review of Systems  Constitutional: Positive for malaise/fatigue. Negative for fever and weight loss.  Respiratory: Negative.  Negative for cough, hemoptysis and shortness of breath.   Cardiovascular: Negative.  Negative for chest pain and leg swelling.  Gastrointestinal: Negative.  Negative for abdominal pain, blood in stool, diarrhea and melena.  Genitourinary: Negative.  Negative for hematuria.  Musculoskeletal: Negative.  Negative for back pain.  Skin: Negative.  Negative for rash.  Neurological: Positive for weakness. Negative for sensory change, focal weakness and headaches.  Psychiatric/Behavioral: Negative.  The patient is not nervous/anxious.     As per HPI. Otherwise, a complete review of systems is negative.  PAST MEDICAL HISTORY: Past Medical History:  Diagnosis Date  . Acute MI [facility])    pt denies  . Arthritis of lumbar spine   . Asthma   . Clotting disorder ([place])   . Collapsed lung    x3  . COPD (chronic obstructive pulmonary disease) ([place])   .  Dyspnea   . History of benign thyroid tumor   . Lung cancer ([place])   . Mycobacterium avium complex ([place]) 06/24/2016  . Pneumonia   . Pneumothorax   . Small cell carcinoma of lung ([place])   . Spontaneous pneumothorax     PAST SURGICAL HISTORY: Past Surgical History:  Procedure Laterality Date  . APPENDECTOMY    . benign thryoid nodule resected    . CARDIAC CATHETERIZATION  2013   [facility]  . CHEST TUBE INSERTION    . COLONOSCOPY N/A 08/30/2015   Procedure: COLONOSCOPY;  Surgeon: Rogene Houston, MD;  Location: [facility];  Service: Endoscopy;  Laterality: N/A;  1:45  . left upper lobectomy for small cell lung cancer    . VATS R thoracotomy      FAMILY HISTORY: Family History  Problem Relation Age of Onset  . Heart disease Mother   . Colon cancer Father   . Heart failure Brother     ADVANCED DIRECTIVES (Y/N):  N  HEALTH MAINTENANCE: Social History   Tobacco Use  . Smoking status: Former Smoker    Packs/day: 3.00    Years: 30.00    Pack years: 90.00    Types: Cigars    Last attempt to quit: 03/02/1992    Years since quitting: 26.7  . Smokeless tobacco: Never Used  . Tobacco comment: quit 22 yrs ago  Substance Use Topics  . Alcohol use: No    Alcohol/week: 0.0 standard drinks  . Drug use: No     Colonoscopy:  PAP:  Bone density:  Lipid panel:  Allergies  Allergen Reactions  .  Neomy-Bacit-Polymyx-Pramoxine Anaphylaxis  . Other Other (See Comments)    Hydromet cough syrup-causes GI upset  . Codeine Nausea And Vomiting    Current Outpatient Medications  Medication Sig Dispense Refill  . acetaminophen (TYLENOL) 325 MG tablet Take 650 mg by mouth as needed.      Marland Kitchen albuterol (PROVENTIL HFA;VENTOLIN HFA) 108 (90 Base) MCG/ACT inhaler Inhale 2 puffs into the lungs every 6 (six) hours as needed for wheezing or shortness of breath. 1 Inhaler 12  . albuterol (PROVENTIL) (2.5 MG/3ML) 0.083% nebulizer solution Take 3 mLs (2.5 mg total) by nebulization 2 (two) times daily.  With chest percussion treatment. May also use every 6 hrs as needed for increased shortness of breath, wheezing, chest tightness 360 mL 3  . aspirin 81 MG EC tablet Take 81 mg by mouth daily. Swallow whole.    Marland Kitchen atorvastatin (LIPITOR) 10 MG tablet TAKE 1 TABLET BY MOUTH EVERY DAY 90 tablet 3  . Fluticasone-Salmeterol (ADVAIR DISKUS) 100-50 MCG/DOSE AEPB Inhale 1 puff into the lungs 2 (two) times daily. 60 each 12  . furosemide (LASIX) 20 MG tablet Take 20 mg by mouth.    Marland Kitchen guaiFENesin (MUCINEX) 600 MG 12 hr tablet Take 600 mg by mouth daily.     . potassium chloride (KLOR-CON) 20 MEQ packet Take 20 mEq by mouth daily.     . prednisoLONE acetate (PRED FORTE) 1 % ophthalmic suspension Administer 1 drop into the left eye Four (4) times a day.    . traMADol (ULTRAM) 50 MG tablet Take 1 tablet (50 mg total) by mouth every 6 (six) hours as needed. Takes 2 tablets 4 times daily as needed for pain. 20 tablet 0   No current facility-administered medications for this visit.    Facility-Administered Medications Ordered in Other Visits  Medication Dose Route Frequency Provider Last Rate Last Dose  . 0.9 %  sodium chloride infusion   Intravenous Continuous Lloyd Huger, MD 10 mL/hr at 12/05/18 1405      OBJECTIVE: Vitals:   12/05/18 1321  BP: (!) 149/80  Pulse: 93  Resp: 20  Temp: 98.7 F (37.1 C)     Body mass index is 23.45 kg/m.    ECOG FS:0 - Asymptomatic  General: Well-developed, well-nourished, no acute distress. Eyes: Pink conjunctiva, anicteric sclera. HEENT: Normocephalic, moist mucous membranes. Lungs: Clear to auscultation bilaterally. Heart: Regular rate and rhythm. No rubs, murmurs, or gallops. Abdomen: Soft, nontender, nondistended. No organomegaly noted, normoactive bowel sounds. Musculoskeletal: No edema, cyanosis, or clubbing. Neuro: Alert, answering all questions appropriately. Cranial nerves grossly intact. Skin: No rashes or petechiae noted. Psych: Normal  affect.  LAB RESULTS:  Lab Results  Component Value Date   NA 134 (L) 08/12/2016   K 3.5 08/12/2016   CL 97 (L) 08/12/2016   CO2 25 08/12/2016   GLUCOSE 92 08/12/2016   BUN 9 08/12/2016   CREATININE 0.86 08/12/2016   CALCIUM 8.9 08/12/2016   PROT 7.4 05/16/2018   ALBUMIN 3.8 05/16/2018   AST 19 05/16/2018   ALT 15 05/16/2018   ALKPHOS 78 05/16/2018   BILITOT 0.9 05/16/2018   GFRNONAA >60 08/12/2016   GFRAA >60 08/12/2016    Lab Results  Component Value Date   WBC 13.9 (H) 12/05/2018   NEUTROABS 11.6 (H) 12/05/2018   HGB 10.7 (L) 12/05/2018   HCT 33.5 (L) 12/05/2018   MCV 87.9 12/05/2018   PLT 375 12/05/2018   Lab Results  Component Value Date   IRON 23 (L) 12/05/2018  TIBC 233 (L) 12/05/2018   IRONPCTSAT 10 (L) 12/05/2018   Lab Results  Component Value Date   FERRITIN 462 (H) 12/05/2018     STUDIES: No results found.  ASSESSMENT: Iron deficiency anemia.  PLAN:    1.  Iron deficiency anemia: Patient's hemoglobin has trended down slightly and he has decreased iron stores. Previously, the remainder of his laboratory work was either negative or within normal limits.  We will proceed with one infusion of 510 mg IV Feraheme today.  He does not require second infusion.  Return to clinic in 4 months with repeat laboratory work, further evaluation, and consideration of additional treatment.    2.  MAC: Continue treatment and follow-up her pulmonology.   3.  Elevated ferritin: Likely secondary to acute phase reactant.  Monitor. 4.  Leukocytosis: Chronic and unchanged.  Likely reactive, monitor.   5.  Thrombocytosis: Resolved.    Patient expressed understanding and was in agreement with this plan. He also understands that He can call clinic at any time with any questions, concerns, or complaints.    Lloyd Huger, MD   12/05/2018 2:45 PM

## 2018-12-05 ENCOUNTER — Inpatient Hospital Stay (HOSPITAL_BASED_OUTPATIENT_CLINIC_OR_DEPARTMENT_OTHER): Payer: Medicare Other | Admitting: Oncology

## 2018-12-05 ENCOUNTER — Inpatient Hospital Stay: Payer: Medicare Other

## 2018-12-05 ENCOUNTER — Other Ambulatory Visit: Payer: Self-pay

## 2018-12-05 ENCOUNTER — Inpatient Hospital Stay: Payer: Medicare Other | Attending: Oncology

## 2018-12-05 VITALS — BP 149/80 | HR 93 | Temp 98.7°F | Resp 20 | Wt 149.7 lb

## 2018-12-05 DIAGNOSIS — R5383 Other fatigue: Secondary | ICD-10-CM | POA: Insufficient documentation

## 2018-12-05 DIAGNOSIS — R531 Weakness: Secondary | ICD-10-CM | POA: Diagnosis not present

## 2018-12-05 DIAGNOSIS — Z79899 Other long term (current) drug therapy: Secondary | ICD-10-CM | POA: Insufficient documentation

## 2018-12-05 DIAGNOSIS — J449 Chronic obstructive pulmonary disease, unspecified: Secondary | ICD-10-CM

## 2018-12-05 DIAGNOSIS — D509 Iron deficiency anemia, unspecified: Secondary | ICD-10-CM | POA: Insufficient documentation

## 2018-12-05 DIAGNOSIS — Z7982 Long term (current) use of aspirin: Secondary | ICD-10-CM | POA: Insufficient documentation

## 2018-12-05 DIAGNOSIS — I252 Old myocardial infarction: Secondary | ICD-10-CM | POA: Insufficient documentation

## 2018-12-05 DIAGNOSIS — D500 Iron deficiency anemia secondary to blood loss (chronic): Secondary | ICD-10-CM

## 2018-12-05 DIAGNOSIS — Z85118 Personal history of other malignant neoplasm of bronchus and lung: Secondary | ICD-10-CM | POA: Diagnosis not present

## 2018-12-05 DIAGNOSIS — Z800 Family history of malignant neoplasm of digestive organs: Secondary | ICD-10-CM | POA: Insufficient documentation

## 2018-12-05 DIAGNOSIS — R7989 Other specified abnormal findings of blood chemistry: Secondary | ICD-10-CM | POA: Diagnosis not present

## 2018-12-05 DIAGNOSIS — Z87891 Personal history of nicotine dependence: Secondary | ICD-10-CM | POA: Insufficient documentation

## 2018-12-05 DIAGNOSIS — D72829 Elevated white blood cell count, unspecified: Secondary | ICD-10-CM

## 2018-12-05 LAB — CBC WITH DIFFERENTIAL/PLATELET
Abs Immature Granulocytes: 0.1 10*3/uL — ABNORMAL HIGH (ref 0.00–0.07)
BASOS PCT: 1 %
Basophils Absolute: 0.1 10*3/uL (ref 0.0–0.1)
Eosinophils Absolute: 0.4 10*3/uL (ref 0.0–0.5)
Eosinophils Relative: 3 %
HCT: 33.5 % — ABNORMAL LOW (ref 39.0–52.0)
Hemoglobin: 10.7 g/dL — ABNORMAL LOW (ref 13.0–17.0)
Immature Granulocytes: 1 %
Lymphocytes Relative: 4 %
Lymphs Abs: 0.6 10*3/uL — ABNORMAL LOW (ref 0.7–4.0)
MCH: 28.1 pg (ref 26.0–34.0)
MCHC: 31.9 g/dL (ref 30.0–36.0)
MCV: 87.9 fL (ref 80.0–100.0)
MONO ABS: 1.2 10*3/uL — AB (ref 0.1–1.0)
MONOS PCT: 9 %
NEUTROS ABS: 11.6 10*3/uL — AB (ref 1.7–7.7)
Neutrophils Relative %: 82 %
PLATELETS: 375 10*3/uL (ref 150–400)
RBC: 3.81 MIL/uL — AB (ref 4.22–5.81)
RDW: 14.1 % (ref 11.5–15.5)
WBC: 13.9 10*3/uL — AB (ref 4.0–10.5)
nRBC: 0 % (ref 0.0–0.2)

## 2018-12-05 LAB — FERRITIN: Ferritin: 462 ng/mL — ABNORMAL HIGH (ref 24–336)

## 2018-12-05 LAB — IRON AND TIBC
IRON: 23 ug/dL — AB (ref 45–182)
SATURATION RATIOS: 10 % — AB (ref 17.9–39.5)
TIBC: 233 ug/dL — AB (ref 250–450)
UIBC: 210 ug/dL

## 2018-12-05 MED ORDER — SODIUM CHLORIDE 0.9 % IV SOLN
INTRAVENOUS | Status: DC
  Administered 2018-12-05: 14:00:00 via INTRAVENOUS
  Filled 2018-12-05: qty 250

## 2018-12-05 MED ORDER — SODIUM CHLORIDE 0.9 % IV SOLN
510.0000 mg | Freq: Once | INTRAVENOUS | Status: AC
  Administered 2018-12-05: 510 mg via INTRAVENOUS
  Filled 2018-12-05: qty 17

## 2018-12-08 DIAGNOSIS — Z947 Corneal transplant status: Secondary | ICD-10-CM | POA: Diagnosis not present

## 2018-12-08 DIAGNOSIS — T86840 Corneal transplant rejection: Secondary | ICD-10-CM | POA: Diagnosis not present

## 2018-12-08 DIAGNOSIS — H16102 Unspecified superficial keratitis, left eye: Secondary | ICD-10-CM | POA: Diagnosis not present

## 2018-12-08 DIAGNOSIS — H2000 Unspecified acute and subacute iridocyclitis: Secondary | ICD-10-CM | POA: Diagnosis not present

## 2018-12-14 ENCOUNTER — Other Ambulatory Visit: Payer: Self-pay | Admitting: Pulmonary Disease

## 2018-12-23 LAB — MAC SUSCEPTIBILITY BROTH
Amikacin: 32
Clarithromycin: >64
Ethambutol: 4
Rifampin: 8

## 2018-12-23 LAB — ACID FAST CULTURE WITH REFLEXED SENSITIVITIES: ACID FAST CULTURE - AFSCU3: POSITIVE — AB

## 2018-12-23 LAB — AFB ORGANISM ID BY DNA PROBE
M AVIUM COMPLEX: POSITIVE — AB
M tuberculosis complex: NEGATIVE

## 2019-01-15 ENCOUNTER — Emergency Department
Admission: EM | Admit: 2019-01-15 | Discharge: 2019-01-15 | Disposition: A | Discharge status: Home / Self Care | Payer: Medicare Other | Attending: Student in an Organized Health Care Education/Training Program | Admitting: Student in an Organized Health Care Education/Training Program

## 2019-01-15 ENCOUNTER — Encounter: Payer: Self-pay | Admitting: Internal Medicine

## 2019-01-15 ENCOUNTER — Emergency Department: Payer: Medicare Other

## 2019-01-15 ENCOUNTER — Other Ambulatory Visit: Payer: Self-pay

## 2019-01-15 DIAGNOSIS — R0602 Shortness of breath: Secondary | ICD-10-CM | POA: Diagnosis not present

## 2019-01-15 DIAGNOSIS — J45909 Unspecified asthma, uncomplicated: Secondary | ICD-10-CM | POA: Insufficient documentation

## 2019-01-15 DIAGNOSIS — Z7982 Long term (current) use of aspirin: Secondary | ICD-10-CM | POA: Insufficient documentation

## 2019-01-15 DIAGNOSIS — I252 Old myocardial infarction: Secondary | ICD-10-CM | POA: Insufficient documentation

## 2019-01-15 DIAGNOSIS — D72829 Elevated white blood cell count, unspecified: Secondary | ICD-10-CM | POA: Diagnosis not present

## 2019-01-15 DIAGNOSIS — I82409 Acute embolism and thrombosis of unspecified deep veins of unspecified lower extremity: Secondary | ICD-10-CM | POA: Diagnosis not present

## 2019-01-15 DIAGNOSIS — R0689 Other abnormalities of breathing: Secondary | ICD-10-CM | POA: Diagnosis not present

## 2019-01-15 DIAGNOSIS — R05 Cough: Secondary | ICD-10-CM | POA: Diagnosis not present

## 2019-01-15 DIAGNOSIS — Z20828 Contact with and (suspected) exposure to other viral communicable diseases: Secondary | ICD-10-CM | POA: Diagnosis not present

## 2019-01-15 DIAGNOSIS — I2510 Atherosclerotic heart disease of native coronary artery without angina pectoris: Secondary | ICD-10-CM | POA: Diagnosis not present

## 2019-01-15 DIAGNOSIS — R2241 Localized swelling, mass and lump, right lower limb: Secondary | ICD-10-CM | POA: Diagnosis not present

## 2019-01-15 DIAGNOSIS — Z85118 Personal history of other malignant neoplasm of bronchus and lung: Secondary | ICD-10-CM | POA: Insufficient documentation

## 2019-01-15 DIAGNOSIS — J441 Chronic obstructive pulmonary disease with (acute) exacerbation: Secondary | ICD-10-CM | POA: Diagnosis not present

## 2019-01-15 DIAGNOSIS — Z87891 Personal history of nicotine dependence: Secondary | ICD-10-CM | POA: Insufficient documentation

## 2019-01-15 DIAGNOSIS — R000 Tachycardia, unspecified: Secondary | ICD-10-CM | POA: Diagnosis not present

## 2019-01-15 DIAGNOSIS — R0902 Hypoxemia: Secondary | ICD-10-CM | POA: Diagnosis not present

## 2019-01-15 DIAGNOSIS — R069 Unspecified abnormalities of breathing: Secondary | ICD-10-CM | POA: Diagnosis not present

## 2019-01-15 DIAGNOSIS — Z79899 Other long term (current) drug therapy: Secondary | ICD-10-CM | POA: Insufficient documentation

## 2019-01-15 LAB — COMPREHENSIVE METABOLIC PANEL
ALT: 23 U/L (ref 0–44)
AST: 18 U/L (ref 15–41)
Albumin: 3.6 g/dL (ref 3.5–5.0)
Alkaline Phosphatase: 91 U/L (ref 38–126)
Anion gap: 12 (ref 5–15)
BUN: 14 mg/dL (ref 8–23)
CO2: 24 mmol/L (ref 22–32)
Calcium: 9.3 mg/dL (ref 8.9–10.3)
Chloride: 94 mmol/L — ABNORMAL LOW (ref 98–111)
Creatinine, Ser: 1.04 mg/dL (ref 0.61–1.24)
GFR calc Af Amer: >60 mL/min (ref >60)
GFR calc non Af Amer: >60 mL/min (ref >60)
Glucose, Bld: 128 mg/dL — ABNORMAL HIGH (ref 70–99)
Potassium: 3.4 mmol/L — ABNORMAL LOW (ref 3.5–5.1)
Sodium: 130 mmol/L — ABNORMAL LOW (ref 135–145)
Total Bilirubin: 0.9 mg/dL (ref 0.3–1.2)
Total Protein: 8.4 g/dL — ABNORMAL HIGH (ref 6.5–8.1)

## 2019-01-15 LAB — CBC WITH DIFFERENTIAL/PLATELET
Abs Immature Granulocytes: 0.11 10*3/uL — ABNORMAL HIGH (ref 0.00–0.07)
Basophils Absolute: 0.1 10*3/uL (ref 0.0–0.1)
Basophils Relative: 0 %
Eosinophils Absolute: 0.1 10*3/uL (ref 0.0–0.5)
Eosinophils Relative: 1 %
HCT: 37.5 % — ABNORMAL LOW (ref 39.0–52.0)
Hemoglobin: 12 g/dL — ABNORMAL LOW (ref 13.0–17.0)
Immature Granulocytes: 1 %
Lymphocytes Relative: 3 %
Lymphs Abs: 0.6 10*3/uL — ABNORMAL LOW (ref 0.7–4.0)
MCH: 28 pg (ref 26.0–34.0)
MCHC: 32 g/dL (ref 30.0–36.0)
MCV: 87.4 fL (ref 80.0–100.0)
Monocytes Absolute: 1.4 10*3/uL — ABNORMAL HIGH (ref 0.1–1.0)
Monocytes Relative: 8 %
Neutro Abs: 16.7 10*3/uL — ABNORMAL HIGH (ref 1.7–7.7)
Neutrophils Relative %: 87 %
Platelets: 480 10*3/uL — ABNORMAL HIGH (ref 150–400)
RBC: 4.29 MIL/uL (ref 4.22–5.81)
RDW: 14.6 % (ref 11.5–15.5)
WBC: 19 10*3/uL — ABNORMAL HIGH (ref 4.0–10.5)
nRBC: 0 % (ref 0.0–0.2)

## 2019-01-15 LAB — TROPONIN I: Troponin I: <0.03 ng/mL (ref <0.03)

## 2019-01-15 LAB — SARS CORONAVIRUS 2 BY RT PCR (HOSPITAL ORDER, PERFORMED IN ~~LOC~~ HOSPITAL LAB): SARS Coronavirus 2: NEGATIVE

## 2019-01-15 MED ORDER — LEVOFLOXACIN IN D5W 750 MG/150ML IV SOLN
750.0000 mg | Freq: Once | INTRAVENOUS | Status: AC
  Administered 2019-01-15: 08:00:00 750 mg via INTRAVENOUS
  Filled 2019-01-15: qty 150

## 2019-01-15 MED ORDER — ONDANSETRON HCL 4 MG/2ML IJ SOLN: 4.0000 mg | Freq: Four times a day (QID) | INTRAMUSCULAR | Status: DC | PRN

## 2019-01-15 MED ORDER — ONDANSETRON HCL 4 MG PO TABS: 4.0000 mg | ORAL_TABLET | Freq: Four times a day (QID) | ORAL | Status: DC | PRN

## 2019-01-15 MED ORDER — POLYETHYLENE GLYCOL 3350 17 G PO PACK: 17.0000 g | PACK | Freq: Every day | ORAL | Status: DC | PRN

## 2019-01-15 MED ORDER — LEVOFLOXACIN 750 MG PO TABS: 750.0000 mg | ORAL_TABLET | Freq: Every day | ORAL | 0 refills | Status: AC

## 2019-01-15 MED ORDER — ALBUTEROL SULFATE (2.5 MG/3ML) 0.083% IN NEBU: 2.5000 mg | INHALATION_SOLUTION | RESPIRATORY_TRACT | Status: DC | PRN

## 2019-01-15 MED ORDER — IPRATROPIUM-ALBUTEROL 0.5-2.5 (3) MG/3ML IN SOLN
3.0000 mL | Freq: Once | RESPIRATORY_TRACT | Status: AC
  Administered 2019-01-15: 3 mL via RESPIRATORY_TRACT
  Filled 2019-01-15: qty 3

## 2019-01-15 MED ORDER — ENOXAPARIN SODIUM 40 MG/0.4ML ~~LOC~~ SOLN: 40.0000 mg | SUBCUTANEOUS | Status: DC

## 2019-01-15 MED ORDER — METHYLPREDNISOLONE SODIUM SUCC 125 MG IJ SOLR
125.0000 mg | Freq: Once | INTRAMUSCULAR | Status: AC
  Administered 2019-01-15: 06:00:00 125 mg via INTRAVENOUS
  Filled 2019-01-15: qty 2

## 2019-01-15 MED ORDER — ACETAMINOPHEN 325 MG PO TABS: 650.0000 mg | ORAL_TABLET | Freq: Four times a day (QID) | ORAL | Status: DC | PRN

## 2019-01-15 MED ORDER — IPRATROPIUM-ALBUTEROL 0.5-2.5 (3) MG/3ML IN SOLN
3.0000 mL | Freq: Once | RESPIRATORY_TRACT | Status: AC
  Administered 2019-01-15: 06:00:00 3 mL via RESPIRATORY_TRACT
  Filled 2019-01-15: qty 3

## 2019-01-15 MED ORDER — PREDNISONE 20 MG PO TABS: 60.0000 mg | ORAL_TABLET | Freq: Every day | ORAL | 0 refills | Status: AC

## 2019-01-15 MED ORDER — ACETAMINOPHEN 650 MG RE SUPP: 650.0000 mg | Freq: Four times a day (QID) | RECTAL | Status: DC | PRN

## 2019-01-15 MED ORDER — HYDRALAZINE HCL 20 MG/ML IJ SOLN: 10.0000 mg | Freq: Four times a day (QID) | INTRAMUSCULAR | Status: DC | PRN

## 2019-01-15 MED ORDER — LEVOFLOXACIN IN D5W 500 MG/100ML IV SOLN: 500.0000 mg | INTRAVENOUS | Status: DC

## 2019-01-15 NOTE — ED Notes (Signed)
Per MD Quentin Cornwall, hold second set of cultures and Levaquin until 0715 due to pt's receiving nebulized treatments.

## 2019-01-15 NOTE — Progress Notes (Signed)
Patient was supposed to be admitted for observation. Has chronic SOB and cough, He wants to be discharged home from ED  Will need levaquin and prednisone.  Discussed with Dr. Cherylann Banas

## 2019-01-15 NOTE — ED Notes (Signed)
Patient does not want to be admitted for observation. Provider aware and will change status for discharge after speaking with patient.

## 2019-01-15 NOTE — ED Notes (Signed)
Ena Dawley, rn at bedside.

## 2019-01-15 NOTE — H&P (Signed)
[facility] NAME: [name]    MR#:  [id number]  DATE OF BIRTH:  08-Dec-1945  DATE OF ADMISSION:  01/15/2019  PRIMARY CARE PHYSICIAN: Asencion Noble, MD   REQUESTING/REFERRING PHYSICIAN: Dr. Quentin Cornwall  CHIEF COMPLAINT:   Chief Complaint  Patient presents with  . Shortness of Breath    HISTORY OF PRESENT ILLNESS:  [name]  is a 73 y.o. male with a known history of below listed medical problems presented to the hospital with acute on chronic shortness of breath.  Patient has had MAC infection along with COPD and progressive Lee worsening shortness of breath which seem to start worsening more over the last 2 days.  He noticed chest congestion and wheezing.  He has chronic cough which is unchanged.  Here in the emergency room patient was given nebulizer with good results.  He was found to have leukocytosis and ED physician requested observation admission.  I saw the patient and he feels better since arriving.  Has chronic shortness of breath, cough and wheezing.  Follows with Dr. Jamal Collin of pulmonary and Dr. Tama High of infectious disease. Chest x-ray did not show any clear infiltrate.  Afebrile.  Not needing oxygen.  Able to speak in complete sentences.  PAST MEDICAL HISTORY:   Past Medical History:  Diagnosis Date  . Acute MI [facility])    pt denies  . Arthritis of lumbar spine   . Asthma   . Clotting disorder ([place])   . Collapsed lung    x3  . COPD (chronic obstructive pulmonary disease) ([place])   . Dyspnea   . History of benign thyroid tumor   . Lung cancer ([place])   . Mycobacterium avium complex ([place]) 06/24/2016  . Pneumonia   . Pneumothorax   . Small cell carcinoma of lung ([place])   . Spontaneous pneumothorax     PAST SURGICAL HISTORY:   Past Surgical History:  Procedure Laterality Date  . APPENDECTOMY    . benign thryoid nodule resected    . CARDIAC CATHETERIZATION  2013   [facility]  . CHEST TUBE INSERTION    . COLONOSCOPY  N/A 08/30/2015   Procedure: COLONOSCOPY;  Surgeon: Rogene Houston, MD;  Location: [facility]O [facility];  Service: Endoscopy;  Laterality: N/A;  1:45  . left upper lobectomy for small cell lung cancer    . VATS R thoracotomy      SOCIAL HISTORY:   Social History   Tobacco Use  . Smoking status: Former Smoker    Packs/day: 3.00    Years: 30.00    Pack years: 90.00    Types: Cigars    Last attempt to quit: 03/02/1992    Years since quitting: 26.8  . Smokeless tobacco: Never Used  . Tobacco comment: quit 22 yrs ago  Substance Use Topics  . Alcohol use: No    Alcohol/week: 0.0 standard drinks    FAMILY HISTORY:   Family History  Problem Relation Age of Onset  . Heart disease Mother   . Colon cancer Father   . Heart failure Brother     DRUG ALLERGIES:   Allergies  Allergen Reactions  . Neomy-Bacit-Polymyx-Pramoxine Anaphylaxis  . Other Other (See Comments)    Hydromet cough syrup-causes GI upset  . Codeine Nausea And Vomiting    REVIEW OF SYSTEMS:   Review of Systems  Constitutional: Positive for malaise/fatigue. Negative for chills and fever.  HENT: Negative for sore throat.   Eyes: Negative for  blurred vision, double vision and pain.  Respiratory: Positive for cough, shortness of breath and wheezing. Negative for hemoptysis.   Cardiovascular: Negative for chest pain, palpitations, orthopnea and leg swelling.  Gastrointestinal: Negative for abdominal pain, constipation, diarrhea, heartburn, nausea and vomiting.  Genitourinary: Negative for dysuria and hematuria.  Musculoskeletal: Negative for back pain and joint pain.  Skin: Negative for rash.  Neurological: Negative for sensory change, speech change, focal weakness and headaches.  Endo/Heme/Allergies: Does not bruise/bleed easily.  Psychiatric/Behavioral: Negative for depression. The patient is not nervous/anxious.     MEDICATIONS AT HOME:   Prior to Admission medications   Medication Sig Start Date End Date Taking?  Authorizing Provider  albuterol (PROVENTIL HFA;VENTOLIN HFA) 108 (90 Base) MCG/ACT inhaler Inhale 2 puffs into the lungs every 6 (six) hours as needed for wheezing or shortness of breath. 03/02/16  Yes Baird Lyons D, MD  aspirin 81 MG EC tablet Take 81 mg by mouth daily. Swallow whole.   Yes [provider]  atorvastatin (LIPITOR) 10 MG tablet TAKE 1 TABLET BY MOUTH EVERY DAY 06/17/15  Yes Gollan, Kathlene November, MD  furosemide (LASIX) 20 MG tablet Take 20 mg by mouth daily.    Yes [provider]  guaiFENesin (MUCINEX) 600 MG 12 hr tablet Take 600 mg by mouth daily.    Yes [provider]  KLOR-CON M20 20 MEQ tablet Take 20 mEq by mouth daily. 11/03/18  Yes [provider]  prednisoLONE acetate (PRED FORTE) 1 % ophthalmic suspension Place 1 drop into the left eye 4 (four) times daily.  10/19/17  Yes [provider]  traMADol (ULTRAM) 50 MG tablet Take 1 tablet (50 mg total) by mouth every 6 (six) hours as needed. Takes 2 tablets 4 times daily as needed for pain. 07/29/16  Yes Cicilia Clinger, Alveta Heimlich, MD  acetaminophen (TYLENOL) 325 MG tablet Take 650 mg by mouth as needed.      [provider]  albuterol (PROVENTIL) (2.5 MG/3ML) 0.083% nebulizer solution Take 3 mLs (2.5 mg total) by nebulization 2 (two) times daily. With chest percussion treatment. May also use every 6 hrs as needed for increased shortness of breath, wheezing, chest tightness 11/23/18   Wilhelmina Mcardle, MD  levofloxacin (LEVAQUIN) 750 MG tablet Take 1 tablet (750 mg total) by mouth daily for 7 days. Start the day after the ER visit 01/15/19 01/22/19  Arta Silence, MD  predniSONE (DELTASONE) 20 MG tablet Take 3 tablets (60 mg total) by mouth daily with breakfast for 4 days. Start the day after the ER visit 01/16/19 01/20/19  Arta Silence, MD     VITAL SIGNS:  Blood pressure (!) 154/81, pulse 95, temperature 98.2 F (36.8 C), temperature source Oral, resp. rate (!) 21, height _0  (1.676  m), weight 63.5 kg, SpO2 100 %.  PHYSICAL EXAMINATION:  Physical Exam  GENERAL:  73 y.o.-year-old patient lying in the bed with some conversational dyspnea EYES: Pupils equal, round, reactive to light and accommodation. No scleral icterus. Extraocular muscles intact.  HEENT: Head atraumatic, normocephalic. Oropharynx and nasopharynx clear. No oropharyngeal erythema, moist oral mucosa  NECK:  Supple, no jugular venous distention. No thyroid enlargement, no tenderness.  LUNGS: Good air entry bilaterally.  Conversational dyspnea.  Bilateral wheezing. CARDIOVASCULAR: S1, S2 normal. No murmurs, rubs, or gallops.  ABDOMEN: Soft, nontender, nondistended. Bowel sounds present. No organomegaly or mass.  EXTREMITIES: No pedal edema, cyanosis, or clubbing. + 2 pedal & radial pulses b/l.   NEUROLOGIC: Cranial nerves II through  XII are intact. No focal Motor or sensory deficits appreciated b/l PSYCHIATRIC: The patient is alert and oriented x 3.  Anxious SKIN: No obvious rash, lesion, or ulcer.   LABORATORY PANEL:   CBC Recent Labs  Lab 01/15/19 0542  WBC 19.0*  HGB 12.0*  HCT 37.5*  PLT 480*   ------------------------------------------------------------------------------------------------------------------  Chemistries  Recent Labs  Lab 01/15/19 0542  NA 130*  K 3.4*  CL 94*  CO2 24  GLUCOSE 128*  BUN 14  CREATININE 1.04  CALCIUM 9.3  AST 18  ALT 23  ALKPHOS 91  BILITOT 0.9   ------------------------------------------------------------------------------------------------------------------  Cardiac Enzymes Recent Labs  Lab 01/15/19 0542  TROPONINI <0.03   ------------------------------------------------------------------------------------------------------------------  RADIOLOGY:  Dg Chest 1 View  Result Date: 01/15/2019 CLINICAL DATA:  Increased coughing x2 weeks. Hx of MI, asthma, COPD, lung cancer, pneumonia, pneumothorax. Former smoker.shob EXAM: CHEST  1 VIEW  COMPARISON:  Radiograph 09/30/2018 FINDINGS: Cardiac silhouette is obscured. Postsurgical change with volume loss in the LEFT hemithorax. The pleural fluid in the inferior LEFT lung is unchanged. Bilateral chronic pulmonary scarring. Pulmonary parenchymal thickening in the superior LEFT lung similar. IMPRESSION: 1. No significant change from comparison exam. 2. Volume loss LEFT hemithorax consistent with prior surgery. Persistent effusion in LEFT lung. 3. Chronic pulmonary parenchymal thickening in the LEFT superior lung . Cannot exclude superimposed infection. Electronically Signed   By: Suzy Bouchard M.D.   On: 01/15/2019 06:06   US Venous Img Lower Unilateral Right  Result Date: 01/15/2019 CLINICAL DATA:  RIGHT leg swelling EXAM: RIGHT LOWER EXTREMITY VENOUS DOPPLER ULTRASOUND TECHNIQUE: Gray-scale sonography with graded compression, as well as color Doppler and duplex ultrasound were performed to evaluate the lower extremity deep venous systems from the level of the common femoral vein and including the common femoral, femoral, profunda femoral, popliteal and calf veins including the posterior tibial, peroneal and gastrocnemius veins when visible. The superficial great saphenous vein was also interrogated. Spectral Doppler was utilized to evaluate flow at rest and with distal augmentation maneuvers in the common femoral, femoral and popliteal veins. COMPARISON:  None. FINDINGS: Contralateral Common Femoral Vein: Respiratory phasicity is normal and symmetric with the symptomatic side. No evidence of thrombus. Normal compressibility. Common Femoral Vein: No evidence of thrombus. Normal compressibility, respiratory phasicity and response to augmentation. Saphenofemoral Junction: No evidence of thrombus. Normal compressibility and flow on color Doppler imaging. Profunda Femoral Vein: No evidence of thrombus. Normal compressibility and flow on color Doppler imaging. Femoral Vein: No evidence of thrombus.  Normal compressibility, respiratory phasicity and response to augmentation. Popliteal Vein: No evidence of thrombus. Normal compressibility, respiratory phasicity and response to augmentation. Calf Veins: No evidence of thrombus. Normal compressibility and flow on color Doppler imaging. Superficial Great Saphenous Vein: No evidence of thrombus. Normal compressibility. IMPRESSION: No evidence of deep venous thrombosis. Electronically Signed   By: Suzy Bouchard M.D.   On: 01/15/2019 09:05     IMPRESSION AND PLAN:   *COPD exacerbation with leukocytosis.  No pneumonia on chest x-ray.  But with patient's chronic changes it is difficult to assess.  Offered patient admission overnight to repeat CBC in the morning and discharge home.  He refuses admission and is requesting to be discharged home.  Discussed with ED physician who will be discharging patient.  Will need Levaquin and prednisone at discharge.  Has nebulizers at home.  Patient will follow-up with his pulmonologist tomorrow.  *Right lower extremity swelling.  Ultrasound ordered.  No DVT.  Other comorbidities remained stable.  All the records are reviewed and case discussed with ED provider. Management plans discussed with the patient, family and they are in agreement.  CODE STATUS: Full code.  Okay for CPR but no intubation.  TOTAL TIME TAKING CARE OF THIS PATIENT: 30 minutes.   Neita Carp M.D on 01/15/2019 at 11:11 AM  Between 7am to 6pm - Pager - [phone]  After 6pm go to [email or web address] - password [email or web address] [facility]  Office  [phone]  CC: Primary care physician; Asencion Noble, MD  Note: This dictation was prepared with Dragon dictation along with smaller phrase technology. Any transcriptional errors that result from this process are unintentional.

## 2019-01-15 NOTE — ED Notes (Signed)
Pt completed nebulizer tx. Will wait 30 minutes before entry into pt room. Pt made aware. Will continue to monitor.

## 2019-01-15 NOTE — ED Triage Notes (Addendum)
Pt states has had a cough for over two years. Pt states he has a chronic "lung disease you get from [place]or". Pt states has had increased cough for last couple of weeks. Pt states he is not able to lie down at night due to cough. Pt requesting oxygen on arrival to ed, states he cannot breathe without oxygen. Pt is able to speak in full sentences without difficulty.

## 2019-01-15 NOTE — Discharge Instructions (Addendum)
Take the Levaquin as prescribed and finish the full course, as well as the prednisone.  Continue your inhalers and other COPD medications.  Return to the ER immediately for new, worsening, or persistent shortness of breath, fever, weakness, chest pain, or any other new or worsening symptoms that concern you.

## 2019-01-15 NOTE — ED Notes (Signed)
Spoke with patient's wife to arrange transport home. ETA is 15 mins.

## 2019-01-15 NOTE — ED Provider Notes (Signed)
[facility] Emergency Department Provider Note    First MD Initiated Contact with Patient 01/15/19 [phone]     (approximate)  I have reviewed the triage vital signs and the nursing notes.   HISTORY  Chief Complaint Shortness of Breath    HPI [name] is a 73 y.o. male with below listed past medical history stress test post partial lobectomy secondary to small cell carcinoma of the lung presents the ER for worsening cough and shortness of breath.  Patient also complaining of some right leg swelling that he is noticed and has a history of blood clots.  Is not currently on any anticoagulation.  No known sick contacts.  No measured fevers.  Does feel winded with any form of exertion.    Past Medical History:  Diagnosis Date   Acute MI ([place])    pt denies   Arthritis of lumbar spine    Asthma    Clotting disorder ([place])    Collapsed lung    x3   COPD (chronic obstructive pulmonary disease) ([facility])    Dyspnea    History of benign thyroid tumor    Lung cancer ([place])    Mycobacterium avium complex ([place]) 06/24/2016   Pneumonia    Pneumothorax    Small cell carcinoma of lung ([facility])    Spontaneous pneumothorax    Family History  Problem Relation Age of Onset   Heart disease Mother    Colon cancer Father    Heart failure Brother    Past Surgical History:  Procedure Laterality Date   APPENDECTOMY     benign thryoid nodule resected     CARDIAC CATHETERIZATION  2013   [facility]   CHEST TUBE INSERTION     COLONOSCOPY N/A 08/30/2015   Procedure: COLONOSCOPY;  Surgeon: Rogene Houston, MD;  Location: [facility];  Service: Endoscopy;  Laterality: N/A;  1:45   left upper lobectomy for small cell lung cancer     VATS R thoracotomy     Patient Active Problem List   Diagnosis Date Noted   Atherosclerosis of native coronary artery of native heart with stable angina pectoris ([place]) 04/09/2018   Iron deficiency anemia due to chronic blood  loss 09/15/2016   Palliative care encounter    DNR (do not resuscitate) discussion    Protein-calorie malnutrition, severe 07/24/2016   Respiratory failure ([place])    Acute respiratory failure ([place]) 07/22/2016   Iron deficiency anemia 07/21/2016   General weakness    Hyponatremia 07/20/2016   COPD exacerbation ([place]) 06/28/2016   Mycobacterium avium complex ([place]) 06/24/2016   Bronchiectasis with acute lower respiratory infection ([place]) 05/20/2016   Lung disease, bullous ([place]) 05/19/2016   Peripheral edema 12/30/2014   Encounter for therapeutic drug monitoring 03/15/2014   Mediastinal adenopathy 01/26/2014   Tick bite of axillary region 12/26/2013   Pulmonary embolus ([place]) 12/22/2013   PNA (pneumonia) 12/22/2013   Hyperlipidemia 12/26/2012   NSTEMI (non-ST elevated myocardial infarction) ([place]) 06/03/2012   HTN (hypertension) 06/03/2012   Adenocarcinoma of left lung ([place]) 04/30/2008   DYSPNEA 11/18/2007   COPD mixed type ([place]) 11/06/2007   SPONTANEOUS PNEUMOTHORAX 10/31/2007      Prior to Admission medications   Medication Sig Start Date End Date Taking? Authorizing Provider  acetaminophen (TYLENOL) 325 MG tablet Take 650 mg by mouth as needed.      [provider]  albuterol (PROVENTIL HFA;VENTOLIN HFA) 108 (90 Base) MCG/ACT inhaler Inhale 2 puffs into the lungs every 6 (  six) hours as needed for wheezing or shortness of breath. 03/02/16   Baird Lyons D, MD  albuterol (PROVENTIL) (2.5 MG/3ML) 0.083% nebulizer solution Take 3 mLs (2.5 mg total) by nebulization 2 (two) times daily. With chest percussion treatment. May also use every 6 hrs as needed for increased shortness of breath, wheezing, chest tightness 11/23/18   Wilhelmina Mcardle, MD  aspirin 81 MG EC tablet Take 81 mg by mouth daily. Swallow whole.    [provider]  atorvastatin (LIPITOR) 10 MG tablet TAKE 1 TABLET BY MOUTH EVERY DAY 06/17/15   Minna Merritts, MD  furosemide (LASIX)  20 MG tablet Take 20 mg by mouth.    [provider]  guaiFENesin (MUCINEX) 600 MG 12 hr tablet Take 600 mg by mouth daily.     [provider]  potassium chloride (KLOR-CON) 20 MEQ packet Take 20 mEq by mouth daily.     [provider]  prednisoLONE acetate (PRED FORTE) 1 % ophthalmic suspension Administer 1 drop into the left eye Four (4) times a day. 10/19/17   [provider]  traMADol (ULTRAM) 50 MG tablet Take 1 tablet (50 mg total) by mouth every 6 (six) hours as needed. Takes 2 tablets 4 times daily as needed for pain. 07/29/16   [name], MD  [place] Ruts INHUB 100-50 MCG/DOSE AEPB TAKE 1 PUFF BY MOUTH TWICE A DAY 12/14/18   Wilhelmina Mcardle, MD    Allergies Neomy-bacit-polymyx-pramoxine; Other; and Codeine    Social History Social History   Tobacco Use   Smoking status: Former Smoker    Packs/day: 3.00    Years: 30.00    Pack years: 90.00    Types: Cigars    Last attempt to quit: 03/02/1992    Years since quitting: 26.8   Smokeless tobacco: Never Used   Tobacco comment: quit 22 yrs ago  Substance Use Topics   Alcohol use: No    Alcohol/week: 0.0 standard drinks   Drug use: No    Review of Systems Patient denies headaches, rhinorrhea, blurry vision, numbness, shortness of breath, chest pain, edema, cough, abdominal pain, nausea, vomiting, diarrhea, dysuria, fevers, rashes or hallucinations unless otherwise stated above in HPI. ____________________________________________   PHYSICAL EXAM:  VITAL SIGNS: Vitals:   01/15/19 0524 01/15/19 0703  BP: (!) 155/78 (!) 166/82  Pulse: 86 93  Resp: (!) 22 (!) 22  Temp: 98.5 F (36.9 C)   SpO2: 94% 97%    Constitutional: Alert and oriented.  Eyes: Conjunctivae are normal.  Head: Atraumatic. Nose: No congestion/rhinnorhea. Mouth/Throat: Mucous membranes are moist.   Neck: No stridor. Painless ROM.  Cardiovascular: Normal rate, regular rhythm. Grossly normal heart sounds.  Good  peripheral circulation. Respiratory: mild tachypnea, prolonged expiratory phase,  Expiratory faint wheeze,  Left fields with diminished breath sounds Gastrointestinal: Soft and nontender. No distention. No abdominal bruits. No CVA tenderness. Genitourinary:  Musculoskeletal: No lower extremity tenderness, r>L LE edema.  No joint effusions. Neurologic:  Normal speech and language. No gross focal neurologic deficits are appreciated. No facial droop Skin:  Skin is warm, dry and intact. No rash noted. Psychiatric: Mood and affect are normal. Speech and behavior are normal.  ____________________________________________   LABS (all labs ordered are listed, but only abnormal results are displayed)  Results for orders placed or performed during the hospital encounter of 01/15/19 (from the past 24 hour(s))  Troponin I - ONCE - STAT     Status: None   Collection Time: 01/15/19  5:42 AM  Result Value Ref Range   Troponin I <0.03 <0.03 ng/mL  CBC with Differential     Status: Abnormal   Collection Time: 01/15/19  5:42 AM  Result Value Ref Range   WBC 19.0 (H) 4.0 - 10.5 K/uL   RBC 4.29 4.22 - 5.81 MIL/uL   Hemoglobin 12.0 (L) 13.0 - 17.0 g/dL   HCT 37.5 (L) 39.0 - 52.0 %   MCV 87.4 80.0 - 100.0 fL   MCH 28.0 26.0 - 34.0 pg   MCHC 32.0 30.0 - 36.0 g/dL   RDW 14.6 11.5 - 15.5 %   Platelets 480 (H) 150 - 400 K/uL   nRBC 0.0 0.0 - 0.2 %   Neutrophils Relative % 87 %   Neutro Abs 16.7 (H) 1.7 - 7.7 K/uL   Lymphocytes Relative 3 %   Lymphs Abs 0.6 (L) 0.7 - 4.0 K/uL   Monocytes Relative 8 %   Monocytes Absolute 1.4 (H) 0.1 - 1.0 K/uL   Eosinophils Relative 1 %   Eosinophils Absolute 0.1 0.0 - 0.5 K/uL   Basophils Relative 0 %   Basophils Absolute 0.1 0.0 - 0.1 K/uL   Immature Granulocytes 1 %   Abs Immature Granulocytes 0.11 (H) 0.00 - 0.07 K/uL  Comprehensive metabolic panel     Status: Abnormal   Collection Time: 01/15/19  5:42 AM  Result Value Ref Range   Sodium 130 (L) 135 - 145  mmol/L   Potassium 3.4 (L) 3.5 - 5.1 mmol/L   Chloride 94 (L) 98 - 111 mmol/L   CO2 24 22 - 32 mmol/L   Glucose, Bld 128 (H) 70 - 99 mg/dL   BUN 14 8 - 23 mg/dL   Creatinine, Ser 1.04 0.61 - 1.24 mg/dL   Calcium 9.3 8.9 - 10.3 mg/dL   Total Protein 8.4 (H) 6.5 - 8.1 g/dL   Albumin 3.6 3.5 - 5.0 g/dL   AST 18 15 - 41 U/L   ALT 23 0 - 44 U/L   Alkaline Phosphatase 91 38 - 126 U/L   Total Bilirubin 0.9 0.3 - 1.2 mg/dL   GFR calc non Af Amer >60 >60 mL/min   GFR calc Af Amer >60 >60 mL/min   Anion gap 12 5 - 15   ____________________________________________  EKG My review and personal interpretation at Time: 5:45   Indication: sob  Rate: 90  Rhythm: sinus Axis: left Other: normal intervals, no stemi ____________________________________________  RADIOLOGY  I personally reviewed all radiographic images ordered to evaluate for the above acute complaints and reviewed radiology reports and findings.  These findings were personally discussed with the patient.  Please see medical record for radiology report.  ____________________________________________   PROCEDURES  Procedure(s) performed:  Procedures    Critical Care performed: no ____________________________________________   INITIAL IMPRESSION / ASSESSMENT AND PLAN / ED COURSE  Pertinent labs & imaging results that were available during my care of the patient were reviewed by me and considered in my medical decision making (see chart for details).   DDX: Asthma, copd, CHF, pna, ptx, malignancy, Pe, anemia   [name] is a 73 y.o. who presents to the ED with shortness of breath and cough as described above.  He is currently afebrile but quite tachypneic.  Exam as above.  Suspect COPD exacerbation.  Will also order ultrasound given swelling the right lower extremity.  Will give nebulizer as well as steroids.  The patient will be placed on continuous pulse oximetry and  telemetry for monitoring.  Laboratory evaluation will  be sent to evaluate for the above complaints.     Clinical Course as of Jan 15 703  Sun Jan 15, 2019  0649 Patient with improvement after nebs.  Still sob but improved.  Given his leukocytosis, productive cough will give levaquin.  Anticipate patient will require admission for further medical management.    [PR]    Clinical Course User Index [PR] Merlyn Lot, MD    The patient was evaluated in Emergency Department today for the symptoms described in the history of present illness. He/she was evaluated in the context of the global COVID-19 pandemic, which necessitated consideration that the patient might be at risk for infection with the SARS-CoV-2 virus that causes COVID-19. Institutional protocols and algorithms that pertain to the evaluation of patients at risk for COVID-19 are in a state of rapid change based on information released by regulatory bodies including the CDC and federal and state organizations. These policies and algorithms were followed during the patient's care in the ED.  As part of my medical decision making, I reviewed the following data within the [facility] notes reviewed and incorporated, Labs reviewed, notes from prior ED visits and [place] Controlled Substance Database   ____________________________________________   FINAL CLINICAL IMPRESSION(S) / ED DIAGNOSES  Final diagnoses:  COPD exacerbation ([place])      NEW MEDICATIONS STARTED DURING THIS VISIT:  New Prescriptions   No medications on file     Note:  This document was prepared using Dragon voice recognition software and may include unintentional dictation errors.    Merlyn Lot, MD 01/15/19 [phone]

## 2019-01-15 NOTE — ED Notes (Signed)
Pt wife Aldo Sondgeroth called, updated. Provided additional contact information. [place] [phone]. Home- [phone].

## 2019-01-15 NOTE — ED Notes (Signed)
Ultrasound at beside.

## 2019-01-15 NOTE — ED Notes (Signed)
Patient has episodes of gasping and coughing and exclaiming he cant breathe while nurse at bedside. Pt maintaining SPO2 between 94-98% on RA. Pt repositioned to upright position and reassured. Will continue to reassess.

## 2019-01-15 NOTE — ED Notes (Signed)
Pt ambulatory with two person assist to BR at this time.

## 2019-01-15 NOTE — Progress Notes (Signed)
Advance care planning  Purpose of Encounter COPD  Parties in Attendance Patient  Patients Decisional capacity Patient is anxious but alert and oriented.  Able to make medical decisions. Patient's healthcare power of attorney is his spouse [name]. No ACP documents in place  Discussed with patient regarding his chronic shortness of breath, COPD, prognosis and treatment plan.  He is requesting to be discharged home instead of observation admission.  CODE STATUS discussed.  Patient wants to be be resuscitated but no ventilator if needed.  Explained DNI.  Orders entered and CODE STATUS changed.  DNI  Time spent - 17 minutes

## 2019-01-15 NOTE — ED Provider Notes (Signed)
-----------------------------------------   9:20 AM on 01/15/2019 -----------------------------------------  I took over care on this patient from Dr. Quentin Cornwall.  At the time of shift change, Dr. Quentin Cornwall gave report to the hospitalist for admission.  However, after evaluating the patient, the hospitalist Dr. Darvin Neighbours informed me that the patient does not qualify for inpatient admission and would be under observation status only; he stated that the patient did not want observation, and preferred to go home instead.  I reevaluated the patient.  At this time he appears comfortable.  He is speaking in full sentences without difficulty.  O2 saturation is 100% on room air.  He confirmed that he does not want to come into the hospital at this time under observation status.  He states he feels comfortable going home.  His lab work-up is reassuring.  Ultrasound shows no acute DVT.  I informed the patient that we be happy to bring him in under observation if he preferred, but he again stated that he wanted to go home.  I counseled the patient thoroughly on return precautions and he expressed understanding.  He states he will follow-up with his pulmonologist Dr. Alva Garnet on Monday.  Based on discussion with Dr. Darvin Neighbours, we will prescribe the patient Levaquin for home.   Arta Silence, MD 01/15/19 [phone]

## 2019-01-17 ENCOUNTER — Telehealth: Payer: Self-pay

## 2019-01-17 NOTE — Telephone Encounter (Signed)
Spoke to patient's wife, she stated patient was in the ED this past weekend with Shortness of Breath. After testing, patient was discharged on 750 mg Levaquin, PO and Prednisone 60 mg for 4 days. When he takes the Levaquin, he throws up. Patient's wife is wondering if he can split the Levaquin into 2 doses (split the pill) or if Dr. Alva Garnet can prescribe some Anti-nausea medicine. He is still coughing with clear mucus production. He tested negative for COVID-9. Will route to Dr. Alva Garnet for advise.

## 2019-01-18 ENCOUNTER — Telehealth: Payer: Self-pay

## 2019-01-18 MED ORDER — ONDANSETRON HCL 4 MG PO TABS: 4.0000 mg | ORAL_TABLET | Freq: Three times a day (TID) | ORAL | 0 refills | Status: DC | PRN

## 2019-01-18 NOTE — Telephone Encounter (Signed)
PA submitted though cover my meds: Key: [id number] for zofran.

## 2019-01-18 NOTE — Telephone Encounter (Signed)
He may split pill in two and use twice a day. I placed order for Zofran which may be used as needed for nausea  Thanks  [name]

## 2019-01-18 NOTE — Telephone Encounter (Signed)
Per [facility], zofran is not covered. Unable to obtain PA.   Will route to Dr. Alva Garnet to see if we can we change to phenergan.

## 2019-01-18 NOTE — Telephone Encounter (Signed)
Called and spoke to patient, let him know of Dr. Alva Garnet recommendations. He is aware he can split the pill in half and zofran is called in for him. He states he is feeling a little better. Nothing further at this time.

## 2019-01-20 LAB — CULTURE, BLOOD (ROUTINE X 2)
Culture: NO GROWTH
Culture: NO GROWTH

## 2019-01-23 ENCOUNTER — Encounter: Payer: Self-pay | Admitting: Pulmonary Disease

## 2019-01-23 ENCOUNTER — Other Ambulatory Visit: Payer: Self-pay

## 2019-01-23 ENCOUNTER — Ambulatory Visit (INDEPENDENT_AMBULATORY_CARE_PROVIDER_SITE_OTHER): Payer: Medicare Other | Admitting: Pulmonary Disease

## 2019-01-23 VITALS — BP 138/62 | HR 98 | Ht 66.0 in | Wt 143.0 lb

## 2019-01-23 DIAGNOSIS — J471 Bronchiectasis with (acute) exacerbation: Secondary | ICD-10-CM

## 2019-01-23 DIAGNOSIS — A310 Pulmonary mycobacterial infection: Secondary | ICD-10-CM | POA: Diagnosis not present

## 2019-01-23 MED ORDER — TOBRAMYCIN 300 MG/5ML IN NEBU: 300.0000 mg | INHALATION_SOLUTION | Freq: Two times a day (BID) | RESPIRATORY_TRACT | 5 refills | Status: DC

## 2019-01-23 NOTE — Patient Instructions (Addendum)
Initiate nebulized tobramycin (Tobi) - 300 mg nebulized twice a day Continue everything else the same Follow up in 6 weeks

## 2019-01-23 NOTE — Progress Notes (Signed)
PULMONARY OFFICE FOLLOW UP  PROBLEMS: 1) Severe COPD 2) Pulmonary MAC - he has been intolerant to attempts @ Rx 3) Bronchiectasis 4) H/O lung cancer - S/P LLL resection 2000 5) Former smoker - quit 1995  INTERVAL HISTORY: Last seen 02/26. Seen in ED 04/19, offered admission, refused. Treated with levofloxacin and pred   SUBJ: This is a post-ED follow-up. He had trouble tolerating levofloxacin due to severe nausea. He is improved but still has copious resp secretions that are yellow-green and he feels that he has not recovered back to his previous baseline. He denies CP, fever, hemoptysis, LE edema and calf tenderness. He is using chest percussion and it continues to help him mobilize respiratory secretions.    He continues to use the chest percussion vest compliantly and continues to benefit from it.    OBJ: Vitals:   01/23/19 1100 01/23/19 1101  BP:  138/62  Pulse:  98  SpO2:  99%  Weight: 143 lb (64.9 kg)   Height: _0  (1.676 m)   RA  Gen: NAD HEENT: NCAT, sclerae white Neck: No JVD Lungs: breath sounds mildly diminished with scattered rhonchi Cardiovascular: RRR, no murmurs Abdomen: Soft, nontender, normal BS Ext: without clubbing, cyanosis, edema Neuro: grossly intact Skin: Limited exam, no lesions noted    DATA: BMP Latest Ref Rng & Units 01/15/2019 08/12/2016 08/04/2016  Glucose 70 - 99 mg/dL 128(H) 92 107(H)  BUN 8 - 23 mg/dL _1 Creatinine 0.61 - 1.24 mg/dL 1.04 0.86 0.81  Sodium 135 - 145 mmol/L 130(L) 134(L) 136  Potassium 3.5 - 5.1 mmol/L 3.4(L) 3.5 2.9(L)  Chloride 98 - 111 mmol/L 94(L) 97(L) 94(L)  CO2 22 - 32 mmol/L _2 Calcium 8.9 - 10.3 mg/dL 9.3 8.9 8.6(L)    CBC Latest Ref Rng & Units 01/15/2019 12/05/2018 08/04/2018  WBC 4.0 - 10.5 K/uL 19.0(H) 13.9(H) 12.2(H)  Hemoglobin 13.0 - 17.0 g/dL 12.0(L) 10.7(L) 11.3(L)  Hematocrit 39.0 - 52.0 % 37.5(L) 33.5(L) 35.0(L)  Platelets 150 - 400 K/uL 480(H) 375 316    CXR 4/19: No new  findings  I have personally reviewed all chest radiographs including chest x-rays and CT scan unless otherwise indicated  IMPRESSION: 1) Severe COPD 2) S/P LLL resection for lung cancer 2000 3) former smoker 4) severe bronchiectasis due to pulmonary MAC. Previously intolerant to combination abx therapy. Now with recurrent exacerbations and becoming increasingly refractory to systemic antibiotic therapy    PLAN: 1) continue Advair inhaler, 1 inhalation twice daily.  Rinse mouth after use 2) continue chest percussion vest 15-30 minutes twice daily 3) new prescription: Nebulized albuterol 2.5 mg with chest percussion vest treatments. He is also to use as needed up to every 6 hours for shortness of breath, wheezing, chest tightness, cough 4) We had a long discussion re: further treatment options. After his visit with DR Delaine Lame, he does not want to try further treatment of MAC. We discussed the possibility of initiating nebulized aminoglycoside therapy if we can get it approved with enough assistance to make it affordable to him. Will first try with Tobi (tobramycin) 300 mg nebulized   Follow up in 6 weeks or sooner as needed  Merton Border, MD PCCM service Mobile [phone] Pager [phone] 01/23/2019 11:07 AM

## 2019-01-24 ENCOUNTER — Ambulatory Visit: Payer: Medicare Other | Admitting: Pulmonary Disease

## 2019-01-24 ENCOUNTER — Telehealth: Payer: Self-pay

## 2019-01-24 MED ORDER — TOBRAMYCIN 300 MG/5ML IN NEBU: 300.0000 mg | INHALATION_SOLUTION | Freq: Two times a day (BID) | RESPIRATORY_TRACT | 5 refills | Status: DC

## 2019-01-24 NOTE — Telephone Encounter (Signed)
Received call from pt's spouse, [name] [facility]).  [name] stated that she received call from [facility], stating that pt's out of pocket cost will be $1500.00. [name] stated that this is not affordable.  I have spoken to Ronda, who states that we can attempt to send Rx to [facility], however a hard Rx is needed.  Rx has been printed and placed in DS's folder for signature.

## 2019-01-24 NOTE — Telephone Encounter (Signed)
Received Rx clarification request for Tobramycin 56AMPS/28day.  I have spoken to [facility] with [facility], who verified that Rx is for Tobramycin neb solution. Clarification request has been placed in DS's folder for signature.

## 2019-01-24 NOTE — Telephone Encounter (Signed)
Spoke to Wanda at [facility] [employer], they have the order for the Tobramycin. It has not yet been confirmed by the pharmacist, so they are unable to tell us if it is covered or not. Will await benefit verification.

## 2019-01-31 DIAGNOSIS — H209 Unspecified iridocyclitis: Secondary | ICD-10-CM | POA: Diagnosis not present

## 2019-01-31 DIAGNOSIS — H16012 Central corneal ulcer, left eye: Secondary | ICD-10-CM | POA: Diagnosis not present

## 2019-01-31 DIAGNOSIS — H16002 Unspecified corneal ulcer, left eye: Secondary | ICD-10-CM | POA: Diagnosis not present

## 2019-01-31 DIAGNOSIS — T86840 Corneal transplant rejection: Secondary | ICD-10-CM | POA: Diagnosis not present

## 2019-01-31 DIAGNOSIS — T1512XA Foreign body in conjunctival sac, left eye, initial encounter: Secondary | ICD-10-CM | POA: Diagnosis not present

## 2019-01-31 NOTE — Telephone Encounter (Signed)
Signed prescription given to [facility].

## 2019-02-02 ENCOUNTER — Telehealth: Payer: Self-pay | Admitting: Cardiovascular Disease

## 2019-02-02 NOTE — Telephone Encounter (Signed)
Spoke with the pt. Pt sts that he is having increased LE swelling. His weight is up 2lbs in 2 days. His sob is stable. Pt denies increased sodium or fluid intake. He usually takes 20mg  of Lasix daily. He has been taking 40mg  daily the last couple of days to see if the swelling improves.  Pt sts the top part of his feet are red. He denies weeping.He denies fever. He completed a round of antibiotics 1 and 1/2 weeks ago.He sts that he does wear compression stockings daily.  Adv the pt to take an additional 20mg  of lasix know. Continue to wear his compression stockings daily, and to elevate his LE as much as possible. Adv him follow a low sodium diet. Adv the pt that I will fwd the message to a provider for additional recommendations. Pt agreeable with the plan and verbalized understanding.

## 2019-02-02 NOTE — Telephone Encounter (Signed)
Please ensure that he is weighing himself at the same time each day.    Has he been recording daily weights?  BP? Has the swelling improved at all with taking double his usual daily dose the last 2 days?    I am hesitant to continue to increase his diuresis without documented weights that will help to assess his current volume status, so that he is not at risk for AKI.  Please let him know that, if his weight does not decrease at all after 1 day of this increased dosage of lasix 20mg  milligrams 3 times daily, he should contact the office again for further recommendations.

## 2019-02-02 NOTE — Telephone Encounter (Signed)
Pt c/o swelling: STAT is pt has developed SOB within 24 hours  How much weight have you gained and in what time span?     Pt weighs 3 puonds heavier than normal 1) If swelling, where is the swelling located? Top of both feet into legs, legs are red  2) Are you currently taking a fluid pill? yes  3) Are you currently SOB? Yes, but patient has lung problems  4) Do you have a log of your daily weights (if so, list)? Today, 145, yesterday 143  5) Have you gained 3 pounds in a day or 5 pounds in a week? yes  6) Have you traveled recently? no

## 2019-02-03 NOTE — Telephone Encounter (Signed)
Call to patient. Spoke with wife. Agreeable to POC.

## 2019-02-03 NOTE — Telephone Encounter (Signed)
Seen in ED 2 weeks ago for SOB.   Takes weight and BP at 8 am each day. Weight the same each day (143.8 lbs) for the past week until yesterday when it was 145 pounds.   BP 5/5 125/66 HR 84 5/6 132/71 HR 86 5/7 129/68 HR 97 Today 136/72 HR 96  Wife reports when he was seen in ED for SOB he was placed on 4 days of prednisone and 1 week course of abx. She believes that some of the LE swelling may be related to these medications, as he is sensitive to abx.   They are agreeable to watch weight over the weekend and will call on Monday if weight does not return to baseline or if SOB does not improve.   In that case, they are agreeable to telephone visit at that time.   Advised pt to call for any further questions or concerns.

## 2019-02-03 NOTE — Telephone Encounter (Signed)
Agree w/ plan.  Suspect prednisone playing a role in wt/volume gain.

## 2019-02-07 ENCOUNTER — Other Ambulatory Visit: Payer: Self-pay

## 2019-02-07 ENCOUNTER — Telehealth (INDEPENDENT_AMBULATORY_CARE_PROVIDER_SITE_OTHER): Payer: Medicare Other | Admitting: Physician Assistant

## 2019-02-07 ENCOUNTER — Telehealth: Payer: Self-pay | Admitting: Cardiovascular Disease

## 2019-02-07 VITALS — BP 115/58 | HR 83 | Ht 66.0 in | Wt 146.5 lb

## 2019-02-07 DIAGNOSIS — R0602 Shortness of breath: Secondary | ICD-10-CM

## 2019-02-07 DIAGNOSIS — J449 Chronic obstructive pulmonary disease, unspecified: Secondary | ICD-10-CM

## 2019-02-07 DIAGNOSIS — I2510 Atherosclerotic heart disease of native coronary artery without angina pectoris: Secondary | ICD-10-CM

## 2019-02-07 DIAGNOSIS — A310 Pulmonary mycobacterial infection: Secondary | ICD-10-CM

## 2019-02-07 DIAGNOSIS — I10 Essential (primary) hypertension: Secondary | ICD-10-CM

## 2019-02-07 DIAGNOSIS — I5033 Acute on chronic diastolic (congestive) heart failure: Secondary | ICD-10-CM

## 2019-02-07 DIAGNOSIS — I272 Pulmonary hypertension, unspecified: Secondary | ICD-10-CM

## 2019-02-07 DIAGNOSIS — J470 Bronchiectasis with acute lower respiratory infection: Secondary | ICD-10-CM

## 2019-02-07 NOTE — Telephone Encounter (Signed)
Error

## 2019-02-07 NOTE — Telephone Encounter (Signed)
Returned call to pts wife. Made e visit appt with Christell Faith, PA d/t ongoing SOB BLE swelling despite taking fluid pill.    3 lb weight gain in 3 days. Wife reports that she is considering an ED visit if SOB doesn't get better soon.   Appt made for 3 pm this afternoon.   Call for e visit appt this afternoon.   CONSENT FOR TELE-HEALTH VISIT - PLEASE REVIEW  I hereby voluntarily request, consent and authorize [facility] and its employed or contracted physicians, physician assistants, nurse practitioners or other licensed health care professionals (the Practitioner), to provide me with telemedicine health care services (the "Services") as deemed necessary by the treating Practitioner. I acknowledge and consent to receive the Services by the Practitioner via telemedicine. I understand that the telemedicine visit will involve communicating with the Practitioner through live audiovisual communication technology and the disclosure of certain medical information by electronic transmission. I acknowledge that I have been given the opportunity to request an in-person assessment or other available alternative prior to the telemedicine visit and am voluntarily participating in the telemedicine visit.  Pt verbally agreed.   I understand that I have the right to withhold or withdraw my consent to the use of telemedicine in the course of my care at any time, without affecting my right to future care or treatment, and that the Practitioner or I may terminate the telemedicine visit at any time. I understand that I have the right to inspect all information obtained and/or recorded in the course of the telemedicine visit and may receive copies of available information for a reasonable fee.  I understand that some of the potential risks of receiving the Services via telemedicine include:  Marland Kitchen Delay or interruption in medical evaluation due to technological equipment failure or disruption; . Information transmitted may  not be sufficient (e.g. poor resolution of images) to allow for appropriate medical decision making by the Practitioner; and/or  . In rare instances, security protocols could fail, causing a breach of personal health information.  Furthermore, I acknowledge that it is my responsibility to provide information about my medical history, conditions and care that is complete and accurate to the best of my ability. I acknowledge that Practitioner's advice, recommendations, and/or decision may be based on factors not within their control, such as incomplete or inaccurate data provided by me or distortions of diagnostic images or specimens that may result from electronic transmissions. I understand that the practice of medicine is not an exact science and that Practitioner makes no warranties or guarantees regarding treatment outcomes. I acknowledge that I will receive a copy of this consent concurrently upon execution via email to the email address I last provided but may also request a printed copy by calling the office of [place].    I understand that my insurance will be billed for this visit.   I have read or had this consent read to me. . I understand the contents of this consent, which adequately explains the benefits and risks of the Services being provided via telemedicine.  . I have been provided ample opportunity to ask questions regarding this consent and the Services and have had my questions answered to my satisfaction. . I give my informed consent for the services to be provided through the use of telemedicine in my medical care  By participating in this telemedicine visit I agree to the above.    Verbally consented to e visit.

## 2019-02-07 NOTE — Telephone Encounter (Signed)
Patient continues to have issues and has seen no improvement   Pt c/o swelling: STAT is pt has developed SOB within 24 hours  1) How much weight have you gained and in what time span? 3 lbs 3 days   2) If swelling, where is the swelling located? BLE calves to feet   3) Are you currently taking a fluid pill? Yes   4) Are you currently SOB? Yes   5) Do you have a log of your daily weights (if so, list)?  5/9 143.8  5/10 145.8  5/11 145.0  5/12 146.8   6) Have you gained 3 pounds in a day or 5 pounds in a week?  No   7) Have you traveled recently? No

## 2019-02-07 NOTE — Progress Notes (Signed)
Virtual Visit via Telephone Note   This visit type was conducted due to national recommendations for restrictions regarding the COVID-19 Pandemic (e.g. social distancing) in an effort to limit this patient's exposure and mitigate transmission in our community.  Due to his co-morbid illnesses, this patient is at least at moderate risk for complications without adequate follow up.  This format is felt to be most appropriate for this patient at this time.  The patient did not have access to video technology/had technical difficulties with video requiring transitioning to audio format only (telephone).  All issues noted in this document were discussed and addressed.  No physical exam could be performed with this format.  Please refer to the patient's chart for his  consent to telehealth for [name].   Date:  02/07/2019   ID:  [name] [birth year], MRN [id number]  Patient Location: Home Provider Location: Home  PCP:  Asencion Noble, MD  Cardiologist:  Ida Rogue, MD  Electrophysiologist:  None   Evaluation Performed:  Follow-Up Visit  Chief Complaint:  Follow up lower extremity swelling  History of Present Illness:    [name] is a 73 y.o. male with history of nonocclusive CAD in the setting of a NSTEMI after choking on a pill in 7106, chronic diastolic CHF, pulmonary hypertension, lung cancer s/p LLL resection in 2000, bronchiectasis secondary to MAC, COPD, spontaneous PTX, asthma, DVT/PE treated with Xarelto in 11/2011, iron deficiency anemia, and chronic SOB who presents for telehealth evaluation of lower extremity swelling.   He was admitted in 04/2012 with a NSTEMI after choking on a pill. Cardiac cath at that time showed nonocclusive CAD with several regions of 20% stenosis and a normal EF. Echo was normal at that time as well. No ischemic evaluations since.   He was admitted in 06/2016 with hyponatremia, weakness, and poor PO intake. Echo at that time showed an EF of  65-70%, normal wall motion, Gr1DD, RVSF normal, PASP 40 mmHg.   He was most recently seen by Dr. Rockey Situ in 03/2018 for follow up. There were no plans for further cardiac work up at that time.   He was seen in the ED on 01/15/2019 for COPD exacerbation. Labs showed a negative COVID-19 test, potassium 3.4, SCr 1.04, normal AST/ALT, WBC 19.0, HGB 12.0, PLT 480, troponin negative x 1. CXR was without significant change when compared to prior with persistent loss of the left hemithorax consistent with prior surgery, persistent left pleural effusion, with possible superimposed infection. EKG showed sinus rhythm with left axis deviation, poor R wave progression, and nonspecific st/t changes. Right lower extremity ultrasound was negative for DVT. He was treated with nebulizer, steroids including a prednisone taper, and Levaquin. He followed up with his pulmonologist on 4/27 with a documented weight of 143 pounds.    Since that time, he has noted increased swelling in his legs and weight gain up from 143-->145 pounds with a dry weight of 143.8 pounds per his wife. He called our office on 5/8 noting this. He had been doubling his Lasix to 40 mg daily for a couple of days with minimal improvement. It was felt his swelling was related to fluid retention from his recent prednisone usage secondary to his AECOPD. He was advised to take Lasix 60 mg x 1 that day. Then following day he noted improvement in his lower extremity swelling and his weight had trended down to 143 pounds. He resumed Lasix 20 mg daily thereafter. With this, he  again began to note increased lower extremity swelling bilaterally and his weight trended back up to 145.8 pounds on 5/10. He called again today noting persistent lower extremity swelling and a weight that had trended to 146.8 pounds as of this morning. He has been off prednisone since around 4/24 and Levaquin since 4/26. His SOB is stable and at baseline. He denies any chest pain, palpitations,  presyncope or syncope. At baseline, he sleeps in a recliner. He denies any changes in his diet or fluid intake. He has been complaint with medications. He did have suffer a mechanical fall on 5/9, tripping over something with his walker. No LOC. EMS was called out to his house and reported normal vitals. He did not seek care in the ED. No falls since. Patient;s wife attributes some of his symptoms to the steroids and Levaquin.   The patient does not have symptoms concerning for COVID-19 infection (fever, chills, cough, or new shortness of breath).    Past Medical History:  Diagnosis Date   Acute [facility])    pt denies   Arthritis of lumbar spine    Asthma    Clotting disorder ([place])    Collapsed lung    x3   COPD (chronic obstructive pulmonary disease) ([place])    Dyspnea    History of benign thyroid tumor    Lung cancer ([place])    Mycobacterium avium complex ([place]) 06/24/2016   Pneumonia    Pneumothorax    Small cell carcinoma of lung ([place])    Spontaneous pneumothorax    Past Surgical History:  Procedure Laterality Date   APPENDECTOMY     benign thryoid nodule resected     CARDIAC CATHETERIZATION  2013   [facility]   CHEST TUBE INSERTION     COLONOSCOPY N/A 08/30/2015   Procedure: COLONOSCOPY;  Surgeon: Rogene Houston, MD;  Location: [facility];  Service: Endoscopy;  Laterality: N/A;  1:45   left upper lobectomy for small cell lung cancer     VATS R thoracotomy       Current Meds  Medication Sig   acetaminophen (TYLENOL) 325 MG tablet Take 650 mg by mouth as needed.     albuterol (PROVENTIL HFA;VENTOLIN HFA) 108 (90 Base) MCG/ACT inhaler Inhale 2 puffs into the lungs every 6 (six) hours as needed for wheezing or shortness of breath.   albuterol (PROVENTIL) (2.5 MG/3ML) 0.083% nebulizer solution Take 3 mLs (2.5 mg total) by nebulization 2 (two) times daily. With chest percussion treatment. May also use every 6 hrs as needed for increased shortness of breath,  wheezing, chest tightness   aspirin 81 MG EC tablet Take 81 mg by mouth daily. Swallow whole.   atorvastatin (LIPITOR) 10 MG tablet TAKE 1 TABLET BY MOUTH EVERY DAY   furosemide (LASIX) 20 MG tablet Take 20 mg by mouth daily.    guaiFENesin (MUCINEX) 600 MG 12 hr tablet Take 600 mg by mouth daily.    KLOR-CON M20 20 MEQ tablet Take 20 mEq by mouth daily.   moxifloxacin (VIGAMOX) 0.5 % ophthalmic solution Apply to eye as directed.   prednisoLONE acetate (PRED FORTE) 1 % ophthalmic suspension Place 1 drop into the left eye 4 (four) times daily.    tobramycin, PF, (TOBI) 300 MG/5ML nebulizer solution Take 5 mLs (300 mg total) by nebulization every 12 (twelve) hours. (Patient taking differently: Take 300 mg by nebulization every 12 (twelve) hours. Not started yet)   traMADol (ULTRAM) 50 MG tablet Take 1 tablet (50  mg total) by mouth every 6 (six) hours as needed. Takes 2 tablets 4 times daily as needed for pain.     Allergies:   Neomy-bacit-polymyx-pramoxine; Other; and Codeine   Social History   Tobacco Use   Smoking status: Former Smoker    Packs/day: 3.00    Years: 30.00    Pack years: 90.00    Types: Cigars    Last attempt to quit: 03/02/1992    Years since quitting: 26.9   Smokeless tobacco: Never Used   Tobacco comment: quit 22 yrs ago  Substance Use Topics   Alcohol use: No    Alcohol/week: 0.0 standard drinks   Drug use: No     Family Hx: The patient's family history includes Colon cancer in his father; Heart disease in his mother; Heart failure in his brother.  ROS:   Please see the history of present illness.     All other systems reviewed and are negative.   Prior CV studies:   The following studies were reviewed today:  2D Echo 06/2016: - Left ventricle: The cavity size was normal. Systolic function was vigorous. The estimated ejection fraction was in the range of 65% to 70%. Wall motion was normal; there were no regional wall motion abnormalities.  Doppler parameters are consistent with abnormal left ventricular relaxation (grade 1 diastolic dysfunction). - Left atrium: The atrium was at the upper limits of normal in   size. - Right ventricle: Systolic function was normal. - Pulmonary arteries: Systolic pressure was mildly elevated. PA peak pressure: 40 mm Hg (S). __________  Labs/Other Tests and Data Reviewed:    EKG:  An ECG dated 01/15/2019 was personally reviewed today and demonstrated:  sinus rhythm with left axis deviation, poor R wave progression, and nonspecific st/t changes  Recent Labs: 01/15/2019: ALT 23; BUN 14; Creatinine, Ser 1.04; Hemoglobin 12.0; Platelets 480; Potassium 3.4; Sodium 130   Recent Lipid Panel No results found for: CHOL, TRIG, HDL, CHOLHDL, LDLCALC, LDLDIRECT  Wt Readings from Last 3 Encounters:  02/07/19 146 lb 8 oz (66.5 kg)  01/23/19 143 lb (64.9 kg)  01/15/19 140 lb (63.5 kg)     Objective:    Vital Signs:  BP (!) 115/58 (BP Location: Left Arm, Patient Position: Sitting)    Pulse 83    Ht _0  (1.676 m)    Wt 146 lb 8 oz (66.5 kg)    SpO2 98%    BMI 23.65 kg/m    VITAL SIGNS:  reviewed  ASSESSMENT & PLAN:    1. Acute on chronic HFpEF/pulmonary hypertension: He appears to be volume overloaded with a current weight of 146.8 pounds with a dry weight of 143.8 pounds. I suspect he does not have much pulmonary reserve in the setting of his severe comorbid pulmonary conditions which is playing a role in his symptoms at this time. He will take Lasix 60 mg daily along with KCl 40 mEq daily x 3 days then go back to his prior dosing of Lasix 20 mg daily and KCl 20 mEq daily. His wife will call us on 5/15 with an update regarding his legs and weight. We are checking an echo, CMET, and BNP early next week as well. CHF education.   2. Nonobstructive CAD: No symptoms concerning for angina at this time. Continue current medication regimen including ASA and Lipitor.   3. Severe COPD/bronchiectasis/MAC: SOB  stable per wife's report. Follow up with pulmonology as directed.   4. Iron deficiency anemia: Stable. Followed by  hematology.  5. HTN: Blood pressure well controlled. No changes.    COVID-19 Education: The signs and symptoms of COVID-19 were discussed with the patient and how to seek care for testing (follow up with PCP or arrange E-visit).  The importance of social distancing was discussed today.  Time:   Today, I have spent 20 minutes with the patient with telehealth technology discussing the above problems.     Medication Adjustments/Labs and Tests Ordered: Current medicines are reviewed at length with the patient today.  Concerns regarding medicines are outlined above.   Tests Ordered: No orders of the defined types were placed in this encounter.   Medication Changes: No orders of the defined types were placed in this encounter.   Disposition:  Follow up in 1 month(s)  Signed, Christell Faith, PA-C  02/07/2019 2:58 PM    [place]

## 2019-02-07 NOTE — Patient Instructions (Signed)
It was a pleasure to speak with you on the phone today! Thank you for allowing [place] to continue taking care of your [facility] needs during this time.   Feel free to call as needed for questions and concerns related to your cardiac needs.   Medication Instructions:  1- Take a total of 60 mg Lasix today, Wednesday and Thursday. 2- Take a total of 40 mEq total today, Wednesday and Thursday.   If you need a refill on your cardiac medications before your next appointment, please call your pharmacy.   Lab work: 1- Your physician recommends that you return for lab work in: 1 week at [facility]. (BNP, CMET)  No appt is needed. Hours are M-F 7AM- 6 PM.  If you have labs (blood work) drawn today and your tests are completely normal, you will receive your results only by: Marland Kitchen MyChart Message (if you have MyChart) OR . A paper copy in the mail If you have any lab test that is abnormal or we need to change your treatment, we will call you to review the results.  Testing/Procedures: 1-Echo  Please return to [facility] on ______________ at _______________ AM/PM for an Echocardiogram. Your physician has requested that you have an echocardiogram. Echocardiography is a painless test that uses sound waves to create images of your heart. It provides your doctor with information about the size and shape of your heart and how well your heart's chambers and valves are working. This procedure takes approximately one hour. There are no restrictions for this procedure. Please note; depending on visual quality an IV may need to be placed.    Follow-Up: At [facility], you and your health needs are our priority.  As part of our continuing mission to provide you with exceptional heart care, we have created designated Provider Care Teams.  These Care Teams include your primary Cardiologist (physician) and Advanced Practice Providers (APPs -  Physician Assistants and Nurse Practitioners) who all work  together to provide you with the care you need, when you need it. You will need a follow up appointment in 1 months. You may see Ida Rogue, MD or Christell Faith, PA-C.

## 2019-02-08 NOTE — Telephone Encounter (Signed)
Received fax from [facility] stating that Tobramycin is scheduled to be shipped to pt on 02/09/2019. Nothing further is needed at this time.

## 2019-02-09 ENCOUNTER — Other Ambulatory Visit: Payer: Self-pay

## 2019-02-09 ENCOUNTER — Telehealth: Payer: Self-pay

## 2019-02-09 ENCOUNTER — Emergency Department: Payer: Medicare Other

## 2019-02-09 ENCOUNTER — Telehealth: Payer: Self-pay | Admitting: Cardiovascular Disease

## 2019-02-09 ENCOUNTER — Inpatient Hospital Stay
Admission: EM | Admit: 2019-02-09 | Discharge: 2019-02-12 | DRG: 177 | Disposition: A | Discharge status: Home Health | Payer: Medicare Other | Attending: Specialist | Admitting: Specialist

## 2019-02-09 DIAGNOSIS — E876 Hypokalemia: Secondary | ICD-10-CM | POA: Diagnosis present

## 2019-02-09 DIAGNOSIS — J440 Chronic obstructive pulmonary disease with acute lower respiratory infection: Secondary | ICD-10-CM | POA: Diagnosis present

## 2019-02-09 DIAGNOSIS — Z800 Family history of malignant neoplasm of digestive organs: Secondary | ICD-10-CM

## 2019-02-09 DIAGNOSIS — Z7952 Long term (current) use of systemic steroids: Secondary | ICD-10-CM

## 2019-02-09 DIAGNOSIS — R069 Unspecified abnormalities of breathing: Secondary | ICD-10-CM | POA: Diagnosis not present

## 2019-02-09 DIAGNOSIS — Z85118 Personal history of other malignant neoplasm of bronchus and lung: Secondary | ICD-10-CM | POA: Diagnosis not present

## 2019-02-09 DIAGNOSIS — E871 Hypo-osmolality and hyponatremia: Secondary | ICD-10-CM | POA: Diagnosis present

## 2019-02-09 DIAGNOSIS — R0602 Shortness of breath: Secondary | ICD-10-CM

## 2019-02-09 DIAGNOSIS — J158 Pneumonia due to other specified bacteria: Principal | ICD-10-CM | POA: Diagnosis present

## 2019-02-09 DIAGNOSIS — A419 Sepsis, unspecified organism: Secondary | ICD-10-CM | POA: Diagnosis not present

## 2019-02-09 DIAGNOSIS — Y95 Nosocomial condition: Secondary | ICD-10-CM | POA: Diagnosis present

## 2019-02-09 DIAGNOSIS — J441 Chronic obstructive pulmonary disease with (acute) exacerbation: Secondary | ICD-10-CM | POA: Diagnosis not present

## 2019-02-09 DIAGNOSIS — E785 Hyperlipidemia, unspecified: Secondary | ICD-10-CM | POA: Diagnosis present

## 2019-02-09 DIAGNOSIS — I110 Hypertensive heart disease with heart failure: Secondary | ICD-10-CM | POA: Diagnosis not present

## 2019-02-09 DIAGNOSIS — Z79899 Other long term (current) drug therapy: Secondary | ICD-10-CM

## 2019-02-09 DIAGNOSIS — R000 Tachycardia, unspecified: Secondary | ICD-10-CM | POA: Diagnosis not present

## 2019-02-09 DIAGNOSIS — Z1159 Encounter for screening for other viral diseases: Secondary | ICD-10-CM

## 2019-02-09 DIAGNOSIS — A310 Pulmonary mycobacterial infection: Secondary | ICD-10-CM | POA: Diagnosis not present

## 2019-02-09 DIAGNOSIS — J9601 Acute respiratory failure with hypoxia: Secondary | ICD-10-CM | POA: Diagnosis present

## 2019-02-09 DIAGNOSIS — R05 Cough: Secondary | ICD-10-CM | POA: Diagnosis not present

## 2019-02-09 DIAGNOSIS — I509 Heart failure, unspecified: Secondary | ICD-10-CM | POA: Diagnosis not present

## 2019-02-09 DIAGNOSIS — Z7982 Long term (current) use of aspirin: Secondary | ICD-10-CM | POA: Diagnosis not present

## 2019-02-09 DIAGNOSIS — R609 Edema, unspecified: Secondary | ICD-10-CM | POA: Diagnosis not present

## 2019-02-09 DIAGNOSIS — J189 Pneumonia, unspecified organism: Secondary | ICD-10-CM | POA: Diagnosis present

## 2019-02-09 DIAGNOSIS — Z87891 Personal history of nicotine dependence: Secondary | ICD-10-CM

## 2019-02-09 LAB — URINALYSIS, COMPLETE (UACMP) WITH MICROSCOPIC
Bacteria, UA: NONE SEEN
Bilirubin Urine: NEGATIVE
Glucose, UA: NEGATIVE mg/dL
Ketones, ur: NEGATIVE mg/dL
Leukocytes,Ua: NEGATIVE
Nitrite: NEGATIVE
Protein, ur: 100 mg/dL — AB
Specific Gravity, Urine: 1.016 (ref 1.005–1.030)
Squamous Epithelial / HPF: NONE SEEN (ref 0–5)
pH: 5 (ref 5.0–8.0)

## 2019-02-09 LAB — CBC WITH DIFFERENTIAL/PLATELET
Abs Immature Granulocytes: 0.09 10*3/uL — ABNORMAL HIGH (ref 0.00–0.07)
Basophils Absolute: 0.1 10*3/uL (ref 0.0–0.1)
Basophils Relative: 1 %
Eosinophils Absolute: 0.1 10*3/uL (ref 0.0–0.5)
Eosinophils Relative: 0 %
HCT: 29.9 % — ABNORMAL LOW (ref 39.0–52.0)
Hemoglobin: 9.8 g/dL — ABNORMAL LOW (ref 13.0–17.0)
Immature Granulocytes: 1 %
Lymphocytes Relative: 5 %
Lymphs Abs: 0.6 10*3/uL — ABNORMAL LOW (ref 0.7–4.0)
MCH: 27.8 pg (ref 26.0–34.0)
MCHC: 32.8 g/dL (ref 30.0–36.0)
MCV: 84.9 fL (ref 80.0–100.0)
Monocytes Absolute: 1.9 10*3/uL — ABNORMAL HIGH (ref 0.1–1.0)
Monocytes Relative: 15 %
Neutro Abs: 10.3 10*3/uL — ABNORMAL HIGH (ref 1.7–7.7)
Neutrophils Relative %: 78 %
Platelets: 511 10*3/uL — ABNORMAL HIGH (ref 150–400)
RBC: 3.52 MIL/uL — ABNORMAL LOW (ref 4.22–5.81)
RDW: 14.5 % (ref 11.5–15.5)
WBC: 13 10*3/uL — ABNORMAL HIGH (ref 4.0–10.5)
nRBC: 0 % (ref 0.0–0.2)

## 2019-02-09 LAB — COMPREHENSIVE METABOLIC PANEL
ALT: 21 U/L (ref 0–44)
AST: 24 U/L (ref 15–41)
Albumin: 2.7 g/dL — ABNORMAL LOW (ref 3.5–5.0)
Alkaline Phosphatase: 68 U/L (ref 38–126)
Anion gap: 12 (ref 5–15)
BUN: 20 mg/dL (ref 8–23)
CO2: 24 mmol/L (ref 22–32)
Calcium: 8.2 mg/dL — ABNORMAL LOW (ref 8.9–10.3)
Chloride: 95 mmol/L — ABNORMAL LOW (ref 98–111)
Creatinine, Ser: 1.16 mg/dL (ref 0.61–1.24)
GFR calc Af Amer: >60 mL/min (ref >60)
GFR calc non Af Amer: >60 mL/min (ref >60)
Glucose, Bld: 129 mg/dL — ABNORMAL HIGH (ref 70–99)
Potassium: 3.5 mmol/L (ref 3.5–5.1)
Sodium: 131 mmol/L — ABNORMAL LOW (ref 135–145)
Total Bilirubin: 0.8 mg/dL (ref 0.3–1.2)
Total Protein: 6.4 g/dL — ABNORMAL LOW (ref 6.5–8.1)

## 2019-02-09 LAB — HEMOGLOBIN A1C
Hgb A1c MFr Bld: 6.2 % — ABNORMAL HIGH (ref 4.8–5.6)
Mean Plasma Glucose: 131.24 mg/dL

## 2019-02-09 LAB — TROPONIN I: Troponin I: <0.03 ng/mL (ref <0.03)

## 2019-02-09 LAB — TSH: TSH: 0.495 u[IU]/mL (ref 0.350–4.500)

## 2019-02-09 LAB — SARS CORONAVIRUS 2 BY RT PCR (HOSPITAL ORDER, PERFORMED IN ~~LOC~~ HOSPITAL LAB): SARS Coronavirus 2: NEGATIVE

## 2019-02-09 LAB — LACTIC ACID, PLASMA: Lactic Acid, Venous: 1.6 mmol/L (ref 0.5–1.9)

## 2019-02-09 LAB — PROTIME-INR
INR: 1.2 (ref 0.8–1.2)
Prothrombin Time: 15.3 seconds — ABNORMAL HIGH (ref 11.4–15.2)

## 2019-02-09 MED ORDER — FUROSEMIDE 10 MG/ML IJ SOLN
20.0000 mg | Freq: Once | INTRAMUSCULAR | Status: AC
  Administered 2019-02-09: 05:00:00 20 mg via INTRAVENOUS
  Filled 2019-02-09: qty 4

## 2019-02-09 MED ORDER — ACETAMINOPHEN 650 MG RE SUPP: 650.0000 mg | Freq: Four times a day (QID) | RECTAL | Status: DC | PRN

## 2019-02-09 MED ORDER — ONDANSETRON HCL 4 MG PO TABS: 4.0000 mg | ORAL_TABLET | Freq: Four times a day (QID) | ORAL | Status: DC | PRN

## 2019-02-09 MED ORDER — PREDNISONE 20 MG PO TABS
40.0000 mg | ORAL_TABLET | Freq: Every day | ORAL | Status: DC
  Administered 2019-02-10 – 2019-02-12 (×3): 40 mg via ORAL
  Filled 2019-02-09 (×3): qty 2

## 2019-02-09 MED ORDER — ACETAMINOPHEN 325 MG PO TABS
650.0000 mg | ORAL_TABLET | Freq: Four times a day (QID) | ORAL | Status: DC | PRN
  Administered 2019-02-11: 10:00:00 650 mg via ORAL
  Filled 2019-02-09: qty 2

## 2019-02-09 MED ORDER — MOXIFLOXACIN HCL 0.5 % OP SOLN
1.0000 [drp] | Freq: Four times a day (QID) | OPHTHALMIC | Status: DC
  Administered 2019-02-09 – 2019-02-12 (×12): 1 [drp] via OPHTHALMIC
  Filled 2019-02-09: qty 3

## 2019-02-09 MED ORDER — SODIUM CHLORIDE 0.9 % IV SOLN
1.0000 g | Freq: Three times a day (TID) | INTRAVENOUS | Status: DC
  Administered 2019-02-09 – 2019-02-12 (×9): 1 g via INTRAVENOUS
  Filled 2019-02-09 (×12): qty 1

## 2019-02-09 MED ORDER — TOBRAMYCIN 300 MG/5ML IN NEBU: 300.0000 mg | INHALATION_SOLUTION | Freq: Two times a day (BID) | RESPIRATORY_TRACT | 5 refills | Status: DC

## 2019-02-09 MED ORDER — ENSURE ENLIVE PO LIQD
237.0000 mL | Freq: Two times a day (BID) | ORAL | Status: DC
  Administered 2019-02-09 – 2019-02-12 (×7): 237 mL via ORAL

## 2019-02-09 MED ORDER — ASPIRIN EC 81 MG PO TBEC
81.0000 mg | DELAYED_RELEASE_TABLET | Freq: Every day | ORAL | Status: DC
  Administered 2019-02-09 – 2019-02-12 (×4): 81 mg via ORAL
  Filled 2019-02-09 (×4): qty 1

## 2019-02-09 MED ORDER — IPRATROPIUM-ALBUTEROL 0.5-2.5 (3) MG/3ML IN SOLN
3.0000 mL | Freq: Once | RESPIRATORY_TRACT | Status: AC
  Administered 2019-02-09: 05:00:00 3 mL via RESPIRATORY_TRACT
  Filled 2019-02-09: qty 3

## 2019-02-09 MED ORDER — ATORVASTATIN CALCIUM 20 MG PO TABS
10.0000 mg | ORAL_TABLET | Freq: Every day | ORAL | Status: DC
  Administered 2019-02-09 – 2019-02-12 (×4): 10 mg via ORAL
  Filled 2019-02-09 (×4): qty 1

## 2019-02-09 MED ORDER — POTASSIUM CHLORIDE CRYS ER 20 MEQ PO TBCR
40.0000 meq | EXTENDED_RELEASE_TABLET | Freq: Every day | ORAL | Status: AC
  Administered 2019-02-09 – 2019-02-10 (×2): 40 meq via ORAL
  Filled 2019-02-09 (×2): qty 2

## 2019-02-09 MED ORDER — GUAIFENESIN-DM 100-10 MG/5ML PO SYRP
5.0000 mL | ORAL_SOLUTION | ORAL | Status: DC | PRN
  Administered 2019-02-09 – 2019-02-11 (×5): 5 mL via ORAL
  Filled 2019-02-09 (×5): qty 5

## 2019-02-09 MED ORDER — DOCUSATE SODIUM 100 MG PO CAPS
100.0000 mg | ORAL_CAPSULE | Freq: Two times a day (BID) | ORAL | Status: DC
  Administered 2019-02-09 – 2019-02-12 (×6): 100 mg via ORAL
  Filled 2019-02-09 (×7): qty 1

## 2019-02-09 MED ORDER — PREDNISOLONE ACETATE 1 % OP SUSP
1.0000 [drp] | Freq: Four times a day (QID) | OPHTHALMIC | Status: DC
  Administered 2019-02-09 – 2019-02-12 (×12): 1 [drp] via OPHTHALMIC
  Filled 2019-02-09: qty 1

## 2019-02-09 MED ORDER — ONDANSETRON HCL 4 MG/2ML IJ SOLN
4.0000 mg | Freq: Four times a day (QID) | INTRAMUSCULAR | Status: DC | PRN
  Administered 2019-02-09: 4 mg via INTRAVENOUS
  Filled 2019-02-09: qty 2

## 2019-02-09 MED ORDER — SODIUM CHLORIDE 0.9 % IV SOLN
2.0000 g | INTRAVENOUS | Status: DC
  Administered 2019-02-09: 03:00:00 2 g via INTRAVENOUS
  Filled 2019-02-09: qty 20

## 2019-02-09 MED ORDER — SODIUM CHLORIDE 0.9 % IV SOLN
500.0000 mg | INTRAVENOUS | Status: DC
  Administered 2019-02-09: 03:00:00 500 mg via INTRAVENOUS
  Filled 2019-02-09 (×2): qty 500

## 2019-02-09 MED ORDER — FUROSEMIDE 10 MG/ML IJ SOLN
40.0000 mg | Freq: Four times a day (QID) | INTRAMUSCULAR | Status: AC
  Administered 2019-02-09 (×3): 40 mg via INTRAVENOUS
  Filled 2019-02-09 (×3): qty 4

## 2019-02-09 MED ORDER — METHYLPREDNISOLONE SODIUM SUCC 125 MG IJ SOLR
125.0000 mg | Freq: Once | INTRAMUSCULAR | Status: AC
  Administered 2019-02-09: 05:00:00 125 mg via INTRAVENOUS
  Filled 2019-02-09: qty 2

## 2019-02-09 MED ORDER — ALBUTEROL SULFATE (2.5 MG/3ML) 0.083% IN NEBU
2.5000 mg | INHALATION_SOLUTION | RESPIRATORY_TRACT | Status: DC | PRN
  Administered 2019-02-09 – 2019-02-10 (×2): 2.5 mg via RESPIRATORY_TRACT
  Filled 2019-02-09 (×3): qty 3

## 2019-02-09 MED ORDER — ENOXAPARIN SODIUM 40 MG/0.4ML ~~LOC~~ SOLN
40.0000 mg | SUBCUTANEOUS | Status: DC
  Administered 2019-02-09 – 2019-02-11 (×3): 40 mg via SUBCUTANEOUS
  Filled 2019-02-09 (×3): qty 0.4

## 2019-02-09 NOTE — Progress Notes (Signed)
Initial Nutrition Assessment  RD working remotely.  DOCUMENTATION CODES:   Not applicable  INTERVENTION:  Recommend liberalizing to regular diet.  Provide Ensure Enlive po BID, each supplement provides 350 kcal and 20 grams of protein.  NUTRITION DIAGNOSIS:   Increased nutrient needs related to catabolic illness(COPD) as evidenced by estimated needs.  GOAL:   Patient will meet greater than or equal to 90% of their needs  MONITOR:   PO intake, Supplement acceptance, Labs, Weight trends, I & O's  REASON FOR ASSESSMENT:   Malnutrition Screening Tool    ASSESSMENT:   73 year old male with PMHx of COPD, small cell carcinoma of lung, arthritis, asthma, MAC infection now admitted with PNA.   Attempted to call patient's phone multiple times but he would either not pick up or hang up shortly after picking up. Per chart patient ate 80% of breakfast this morning. He had a type 6 BM today. Weights in chart appear stable for the past few years.  Medications reviewed and include: Colace 100 mg BID, Lasix 40 mg IV x 3 today, potassium chloride 40 mEq daily, prednisone 40 mg daily.  Labs reviewed: Sodium 131, Chloride 95.  Patient is at risk for malnutrition.  NUTRITION - FOCUSED PHYSICAL EXAM:  Unable to complete at this time.  Diet Order:   Diet Order            Diet Heart Room service appropriate? Yes; Fluid consistency: Thin  Diet effective now             EDUCATION NEEDS:   No education needs have been identified at this time  Skin:  Skin Assessment: Reviewed RN Assessment(ecchymosis)  Last BM:  02/09/2019 - type 6  Height:   Ht Readings from Last 1 Encounters:  02/09/19 _0  (1.702 m)   Weight:   Wt Readings from Last 1 Encounters:  02/09/19 65.2 kg   Ideal Body Weight:  67.3 kg  BMI:  Body mass index is 22.52 kg/m.  Estimated Nutritional Needs:   Kcal:  1640-1910 (MSJ x 1.2-1.4)  Protein:  80-90 grams (1.2-1.4 grams/kg)  Fluid:  1.6-1.9  L/day  Willey Blade, MS, RD, LDN Office: [phone] Pager: [phone] After Hours/Weekend Pager: [phone]

## 2019-02-09 NOTE — Telephone Encounter (Signed)
I spoke with Mrs. Mennen. She states the patient did not respond at all to the med changes made at his e-visit on 02/07/19.  She states his lower extremity edema and SOB got worse and she called EMS ~ 1 am this morning. He is currently admitted.  She states there is some concern that he may have pneumonia.  She is a little frustrated that we did not see the patient in the office this week as she feels this could have been avoided. I advised that I can understand her frustration- it is difficult all around trying to limit exposure due to COVID, but also trying to manage patient's virtually as well.  She is aware that I will forward this message to Dr. Rockey Situ to make him aware of the patient's admission.  She states she had to cancel his echo for tomorrow. I advised this may be done while he is an inpatient.   She voices understanding of the above and is agreeable.

## 2019-02-09 NOTE — H&P (Signed)
[name] is an 73 y.o. male.   Chief Complaint: Shortness of breath HPI: The patient with past medical history of lung cancer, COPD, and MAC infection presents to the emergency department complaining of shortness of breath.  The patient was seen here at the same complaints 2 weeks ago.  He was prescribed antibiotics and steroids following rule out of COVID-19.  Today he returns with worsening dyspnea and nonproductive cough.  This has been a chronic problem as the patient has been seen by pulmonology for bronchiectasis/chronic lung infections including MAC.  Oxygen saturations were normal on room air but the patient reports a fever of 102.6 F at home.  He is afebrile upon arrival but met criteria for sepsis which prompted collection of blood cultures and initiation of antibiotics prior to the emergency department staff calling the hospitalist service for admission.  Past Medical History:  Diagnosis Date  . Acute MI [facility])    pt denies  . Arthritis of lumbar spine   . Asthma   . Clotting disorder ([place])   . Collapsed lung    x3  . COPD (chronic obstructive pulmonary disease) ([place])   . Dyspnea   . History of benign thyroid tumor   . Lung cancer ([place])   . Mycobacterium avium complex ([place]) 06/24/2016  . Pneumonia   . Pneumothorax   . Small cell carcinoma of lung ([place])   . Spontaneous pneumothorax     Past Surgical History:  Procedure Laterality Date  . APPENDECTOMY    . benign thryoid nodule resected    . CARDIAC CATHETERIZATION  2013   [facility]  . CHEST TUBE INSERTION    . COLONOSCOPY N/A 08/30/2015   Procedure: COLONOSCOPY;  Surgeon: Rogene Houston, MD;  Location: [facility];  Service: Endoscopy;  Laterality: N/A;  1:45  . left upper lobectomy for small cell lung cancer    . VATS R thoracotomy      Family History  Problem Relation Age of Onset  . Heart disease Mother   . Colon cancer Father   . Heart failure Brother    Social History:  reports that he quit smoking about 26  years ago. His smoking use included cigars. He has a 90.00 pack-year smoking history. He has never used smokeless tobacco. He reports that he does not drink alcohol or use drugs.  Allergies:  Allergies  Allergen Reactions  . Neomy-Bacit-Polymyx-Pramoxine Anaphylaxis  . Other Other (See Comments)    Hydromet cough syrup-causes GI upset  . Codeine Nausea And Vomiting    Medications Prior to Admission  Medication Sig Dispense Refill  . acetaminophen (TYLENOL) 325 MG tablet Take 650 mg by mouth as needed.      Marland Kitchen albuterol (PROVENTIL HFA;VENTOLIN HFA) 108 (90 Base) MCG/ACT inhaler Inhale 2 puffs into the lungs every 6 (six) hours as needed for wheezing or shortness of breath. 1 Inhaler 12  . albuterol (PROVENTIL) (2.5 MG/3ML) 0.083% nebulizer solution Take 3 mLs (2.5 mg total) by nebulization 2 (two) times daily. With chest percussion treatment. May also use every 6 hrs as needed for increased shortness of breath, wheezing, chest tightness 360 mL 3  . aspirin 81 MG EC tablet Take 81 mg by mouth daily. Swallow whole.    Marland Kitchen atorvastatin (LIPITOR) 10 MG tablet TAKE 1 TABLET BY MOUTH EVERY DAY 90 tablet 3  . furosemide (LASIX) 20 MG tablet Take 20 mg by mouth daily.     Marland Kitchen guaiFENesin (MUCINEX) 600 MG 12 hr tablet  Take 600 mg by mouth daily.     Marland Kitchen KLOR-CON M20 20 MEQ tablet Take 20 mEq by mouth daily.    Marland Kitchen moxifloxacin (VIGAMOX) 0.5 % ophthalmic solution Place 4 drops into the left eye daily. 4 gtts until Monday and then Dr was going to lower it to 2 gtts.    . prednisoLONE acetate (PRED FORTE) 1 % ophthalmic suspension Place 1 drop into the left eye 4 (four) times daily.     Marland Kitchen tobramycin, PF, (TOBI) 300 MG/5ML nebulizer solution Take 5 mLs (300 mg total) by nebulization every 12 (twelve) hours. (Patient taking differently: Take 300 mg by nebulization every 12 (twelve) hours. Not started yet) 300 mL 5  . traMADol (ULTRAM) 50 MG tablet Take 1 tablet (50 mg total) by mouth every 6 (six) hours as needed.  Takes 2 tablets 4 times daily as needed for pain. 20 tablet 0    Results for orders placed or performed during the hospital encounter of 02/09/19 (from the past 48 hour(s))  Comprehensive metabolic panel     Status: Abnormal   Collection Time: 02/09/19  2:14 AM  Result Value Ref Range   Sodium 131 (L) 135 - 145 mmol/L   Potassium 3.5 3.5 - 5.1 mmol/L   Chloride 95 (L) 98 - 111 mmol/L   CO2 24 22 - 32 mmol/L   Glucose, Bld 129 (H) 70 - 99 mg/dL   BUN 20 8 - 23 mg/dL   Creatinine, Ser 1.16 0.61 - 1.24 mg/dL   Calcium 8.2 (L) 8.9 - 10.3 mg/dL   Total Protein 6.4 (L) 6.5 - 8.1 g/dL   Albumin 2.7 (L) 3.5 - 5.0 g/dL   AST 24 15 - 41 U/L   ALT 21 0 - 44 U/L   Alkaline Phosphatase 68 38 - 126 U/L   Total Bilirubin 0.8 0.3 - 1.2 mg/dL   GFR calc non Af Amer >60 >60 mL/min   GFR calc Af Amer >60 >60 mL/min   Anion gap 12 5 - 15    Comment: Performed at [facility], [address] [place], [place] [address]  Lactic acid, plasma     Status: None   Collection Time: 02/09/19  2:14 AM  Result Value Ref Range   Lactic Acid, Venous 1.6 0.5 - 1.9 mmol/L    Comment: Performed at [facility], [address] [place], [place] [address]  CBC with Differential     Status: Abnormal   Collection Time: 02/09/19  2:14 AM  Result Value Ref Range   WBC 13.0 (H) 4.0 - 10.5 K/uL   RBC 3.52 (L) 4.22 - 5.81 MIL/uL   Hemoglobin 9.8 (L) 13.0 - 17.0 g/dL   HCT 29.9 (L) 39.0 - 52.0 %   MCV 84.9 80.0 - 100.0 fL   MCH 27.8 26.0 - 34.0 pg   MCHC 32.8 30.0 - 36.0 g/dL   RDW 14.5 11.5 - 15.5 %   Platelets 511 (H) 150 - 400 K/uL   nRBC 0.0 0.0 - 0.2 %   Neutrophils Relative % 78 %   Neutro Abs 10.3 (H) 1.7 - 7.7 K/uL   Lymphocytes Relative 5 %   Lymphs Abs 0.6 (L) 0.7 - 4.0 K/uL   Monocytes Relative 15 %   Monocytes Absolute 1.9 (H) 0.1 - 1.0 K/uL   Eosinophils Relative 0 %   Eosinophils Absolute 0.1 0.0 - 0.5 K/uL   Basophils Relative 1 %   Basophils Absolute 0.1 0.0 - 0.1 K/uL  Immature Granulocytes 1 %   Abs Immature Granulocytes 0.09 (H) 0.00 - 0.07 K/uL    Comment: Performed at [facility], [address] [place], [place] [address]  Protime-INR     Status: Abnormal   Collection Time: 02/09/19  2:14 AM  Result Value Ref Range   Prothrombin Time 15.3 (H) 11.4 - 15.2 seconds   INR 1.2 0.8 - 1.2    Comment: (NOTE) INR goal varies based on device and disease states. Performed at [facility], [address] [place], [place] [address]   Troponin I - Once     Status: None   Collection Time: 02/09/19  2:14 AM  Result Value Ref Range   Troponin I <0.03 <0.03 ng/mL    Comment: Performed at [facility], [address] [place], [place] [address]  SARS Coronavirus 2 (CEPHEID- Performed in [facility] hospital lab), Hosp Order     Status: None   Collection Time: 02/09/19  2:18 AM  Result Value Ref Range   SARS Coronavirus 2 NEGATIVE NEGATIVE    Comment: (NOTE) If result is NEGATIVE SARS-CoV-2 target nucleic acids are NOT DETECTED. The SARS-CoV-2 RNA is generally detectable in upper and lower  respiratory specimens during the acute phase of infection. The lowest  concentration of SARS-CoV-2 viral copies this assay can detect is 250  copies / mL. A negative result does not preclude SARS-CoV-2 infection  and should not be used as the sole basis for treatment or other  patient management decisions.  A negative result may occur with  improper specimen collection / handling, submission of specimen other  than nasopharyngeal swab, presence of viral mutation(s) within the  areas targeted by this assay, and inadequate number of viral copies  (<250 copies / mL). A negative result must be combined with clinical  observations, patient history, and epidemiological information. If result is POSITIVE SARS-CoV-2 target nucleic acids are DETECTED. The SARS-CoV-2 RNA is generally detectable in upper and lower  respiratory specimens dur ing the  acute phase of infection.  Positive  results are indicative of active infection with SARS-CoV-2.  Clinical  correlation with patient history and other diagnostic information is  necessary to determine patient infection status.  Positive results do  not rule out bacterial infection or co-infection with other viruses. If result is PRESUMPTIVE POSTIVE SARS-CoV-2 nucleic acids MAY BE PRESENT.   A presumptive positive result was obtained on the submitted specimen  and confirmed on repeat testing.  While 2019 novel coronavirus  (SARS-CoV-2) nucleic acids may be present in the submitted sample  additional confirmatory testing may be necessary for epidemiological  and / or clinical management purposes  to differentiate between  SARS-CoV-2 and other Sarbecovirus currently known to infect humans.  If clinically indicated additional testing with an alternate test  methodology [phone]) is advised. The SARS-CoV-2 RNA is generally  detectable in upper and lower respiratory sp ecimens during the acute  phase of infection. The expected result is Negative. Fact Sheet for Patients:  StrictlyIdeas.no Fact Sheet for Healthcare Providers: BankingDealers.co.za This test is not yet approved or cleared by the [place] FDA and has been authorized for detection and/or diagnosis of SARS-CoV-2 by FDA under an Emergency Use Authorization (EUA).  This EUA will remain in effect (meaning this test can be used) for the duration of the COVID-19 declaration under Section 564(b)(1) of the Act, 21 U.S.C. section 360bbb-3(b)(1), unless the authorization is terminated or revoked sooner. Performed at [facility], [address] [place], [place] [address]   Urinalysis, Complete w Microscopic     Status: Abnormal   Collection Time: 02/09/19  2:41 AM  Result Value Ref Range   Color, Urine YELLOW (A) YELLOW   APPearance CLEAR (A) CLEAR   Specific Gravity,  Urine 1.016 1.005 - 1.030   pH 5.0 5.0 - 8.0   Glucose, UA NEGATIVE NEGATIVE mg/dL   Hgb urine dipstick SMALL (A) NEGATIVE   Bilirubin Urine NEGATIVE NEGATIVE   Ketones, ur NEGATIVE NEGATIVE mg/dL   Protein, ur 100 (A) NEGATIVE mg/dL   Nitrite NEGATIVE NEGATIVE   Leukocytes,Ua NEGATIVE NEGATIVE   RBC / HPF 6-10 0 - 5 RBC/hpf   WBC, UA 0-5 0 - 5 WBC/hpf   Bacteria, UA NONE SEEN NONE SEEN   Squamous Epithelial / LPF NONE SEEN 0 - 5   Mucus PRESENT     Comment: Performed at [facility], [address], [place],  [address]   Dg Chest Port 1 View  Result Date: 02/09/2019 CLINICAL DATA:  Shortness of breath. Cough and fever. EXAM: PORTABLE CHEST 1 VIEW COMPARISON:  Radiograph 01/15/2019, additional priors. Most recent chest CT 06/28/2016 FINDINGS: Again seen volume loss and pleuroparenchymal opacities throughout the left hemithorax. Surgical clips at the left hilum. Right lung is hyperinflated with emphysema. Chain sutures noted at the right lung apex. No acute airspace disease. Unchanged heart size and mediastinal contours with chronic leftward mediastinal shift. No pneumothorax. Chronic superior migration of the left humeral head consistent with underlying rotator cuff arthropathy. IMPRESSION: 1. Unchanged radiographic appearance of the chest. 2. Chronic volume loss and pleuroparenchymal opacities throughout the left hemithorax, unchanged from prior exam. 3. Emphysema. Electronically Signed   By: Keith Rake M.D.   On: 02/09/2019 03:03    Review of Systems  Constitutional: Negative for chills and fever.  HENT: Negative for sore throat and tinnitus.   Eyes: Negative for blurred vision and redness.  Respiratory: Negative for cough and shortness of breath.   Cardiovascular: Negative for chest pain, palpitations, orthopnea and PND.  Gastrointestinal: Negative for abdominal pain, diarrhea, nausea and vomiting.  Genitourinary: Negative for dysuria, frequency and urgency.   Musculoskeletal: Negative for joint pain and myalgias.  Skin: Negative for rash.       No lesions  Neurological: Negative for speech change, focal weakness and weakness.  Endo/Heme/Allergies: Does not bruise/bleed easily.       No temperature intolerance  Psychiatric/Behavioral: Negative for depression and suicidal ideas.    Blood pressure 123/73, pulse (!) 102, temperature 99.9 F (37.7 C), temperature source Rectal, resp. rate (!) 24, height 5' 7" (1.702 m), weight 65.2 kg, SpO2 98 %. Physical Exam  Vitals reviewed. Constitutional: He is oriented to person, place, and time. He appears well-developed and well-nourished. No distress.  HENT:  Head: Normocephalic and atraumatic.  Mouth/Throat: Oropharynx is clear and moist.  Eyes: Pupils are equal, round, and reactive to light. Conjunctivae and EOM are normal.  Neck: Normal range of motion. Neck supple. No JVD present. No tracheal deviation present. No thyromegaly present.  Cardiovascular: Normal rate, regular rhythm and normal heart sounds. Exam reveals no gallop and no friction rub.  No murmur heard. Respiratory: Effort normal and breath sounds normal. No respiratory distress.  GI: Soft. Bowel sounds are normal. He exhibits no distension. There is no abdominal tenderness.  Genitourinary:    Genitourinary Comments: Deferred   Musculoskeletal: Normal range of motion.        General: No edema.  Lymphadenopathy:    He has no cervical adenopathy.  Neurological: He is alert and oriented to person, place, and time.  Skin: Skin is warm and dry. No rash noted. No erythema.  Psychiatric: He has a normal mood and affect. His behavior is normal. Judgment and thought content normal.     Assessment/Plan This is a 73 year old male admitted for pneumonia. 1.  Pneumonia: Healthcare associated as the patient has chronic obstructive disease as well as underlying MAC infection and has been resistant to treatment.  The patient has inhaled tobramycin  on his home medication list that he has not yet started.  We may consider this treatment while hospitalized.  For now we will continue with ceftriaxone and azithromycin.  No travel risk factors or known contact with individuals with exposure to novel coronavirus. 2.  Sepsis: Patient meets criteria via tachycardia, tachypnea and leukocytosis.  He is hemodynamically stable.  Follow blood cultures for growth and sensitivities. 3.  MAC infection: Consider infectious disease consult and/or pulmonology. 4.  Hyponatremia: Secondary to chronic lung infection.  Hydrate with saline. 5.  DVT prophylaxis: Lovenox 6.  GI prophylaxis: None Patient is a full code.  Time spent on admission orders and patient care approximately 45 minutes  Harrie Foreman, MD 02/09/2019, 4:51 AM

## 2019-02-09 NOTE — ED Triage Notes (Addendum)
Pt arrives to ED via [facility] from home with c/o [facility]. Pt was seen here for same 2 weeks ago, r/x'd ABX and Steroids for PNA. Pt reports being brought here again d/t worsening s/x's, productive cough, and oral temp of 102.6. Dr Beather Arbour at bedside upon pt's arrival to ED.

## 2019-02-09 NOTE — Progress Notes (Signed)
CODE SEPSIS - PHARMACY COMMUNICATION  **Broad Spectrum Antibiotics should be administered within 1 hour of Sepsis diagnosis**  Time Code Sepsis Called/Page Received: @ 0230  Antibiotics Ordered: ceftriaxone and azithromycin   Time of 1st antibiotic administration: @ 0247  Additional action taken by pharmacy: N/A  If necessary, Name of Provider/Nurse Contacted: N/A  Pernell Dupre, PharmD, [place] Pharmacist 02/09/2019 2:59 AM

## 2019-02-09 NOTE — Progress Notes (Signed)
[place] [facility] [place] NAME: [name]    MR#:  [id number]  DATE OF BIRTH:  10-20-1945  SUBJECTIVE:  CHIEF COMPLAINT:   Chief Complaint  Patient presents with  . Shortness of Breath   Patient is a known case of MAC, was advised to have tobramycin inhalation as outpatient but did not get approved by insurance so could not get it so far. Came with fever and cough.  REVIEW OF SYSTEMS:  CONSTITUTIONAL: have fever, fatigue or weakness.  EYES: No blurred or double vision.  EARS, NOSE, AND THROAT: No tinnitus or ear pain.  RESPIRATORY: ahve cough, shortness of breath, no wheezing or hemoptysis.  CARDIOVASCULAR: No chest pain, orthopnea, edema.  GASTROINTESTINAL: No nausea, vomiting, diarrhea or abdominal pain.  GENITOURINARY: No dysuria, hematuria.  ENDOCRINE: No polyuria, nocturia,  HEMATOLOGY: No anemia, easy bruising or bleeding SKIN: No rash or lesion. MUSCULOSKELETAL: No joint pain or arthritis.   NEUROLOGIC: No tingling, numbness, weakness.  PSYCHIATRY: No anxiety or depression.   ROS  DRUG ALLERGIES:   Allergies  Allergen Reactions  . Other Other (See Comments)    Hydromet cough syrup-causes GI upset  . Codeine Nausea And Vomiting    VITALS:  Blood pressure 128/65, pulse (!) 113, temperature 98.4 F (36.9 C), resp. rate 20, height _0  (1.702 m), weight 65.2 kg, SpO2 96 %.  PHYSICAL EXAMINATION:  GENERAL:  73 y.o.-year-old patient lying in the bed with no acute distress.  EYES: Pupils equal, round, reactive to light and accommodation. No scleral icterus. Extraocular muscles intact.  HEENT: Head atraumatic, normocephalic. Oropharynx and nasopharynx clear.  NECK:  Supple, no jugular venous distention. No thyroid enlargement, no tenderness.  LUNGS: Normal breath sounds bilaterally, no wheezing,have crepitation. No use of accessory muscles of respiration.  CARDIOVASCULAR: S1, S2 normal. No murmurs, rubs, or gallops.  ABDOMEN:  Soft, nontender, nondistended. Bowel sounds present. No organomegaly or mass.  EXTREMITIES: No pedal edema, cyanosis, or clubbing.  NEUROLOGIC: Cranial nerves II through XII are intact. Muscle strength 5/5 in all extremities. Sensation intact. Gait not checked.  PSYCHIATRIC: The patient is alert and oriented x 3.  SKIN: No obvious rash, lesion, or ulcer.   Physical Exam LABORATORY PANEL:   CBC Recent Labs  Lab 02/09/19 0214  WBC 13.0*  HGB 9.8*  HCT 29.9*  PLT 511*   ------------------------------------------------------------------------------------------------------------------  Chemistries  Recent Labs  Lab 02/09/19 0214  NA 131*  K 3.5  CL 95*  CO2 24  GLUCOSE 129*  BUN 20  CREATININE 1.16  CALCIUM 8.2*  AST 24  ALT 21  ALKPHOS 68  BILITOT 0.8   ------------------------------------------------------------------------------------------------------------------  Cardiac Enzymes Recent Labs  Lab 02/09/19 0214  TROPONINI <0.03   ------------------------------------------------------------------------------------------------------------------  RADIOLOGY:  Dg Chest Port 1 View  Result Date: 02/09/2019 CLINICAL DATA:  Shortness of breath. Cough and fever. EXAM: PORTABLE CHEST 1 VIEW COMPARISON:  Radiograph 01/15/2019, additional priors. Most recent chest CT 06/28/2016 FINDINGS: Again seen volume loss and pleuroparenchymal opacities throughout the left hemithorax. Surgical clips at the left hilum. Right lung is hyperinflated with emphysema. Chain sutures noted at the right lung apex. No acute airspace disease. Unchanged heart size and mediastinal contours with chronic leftward mediastinal shift. No pneumothorax. Chronic superior migration of the left humeral head consistent with underlying rotator cuff arthropathy. IMPRESSION: 1. Unchanged radiographic appearance of the chest. 2. Chronic volume loss and pleuroparenchymal opacities throughout the left hemithorax, unchanged  from prior exam. 3. Emphysema. Electronically Signed  By: Keith Rake M.D.   On: 02/09/2019 03:03    ASSESSMENT AND PLAN:   Active Problems:   CAP (community acquired pneumonia)   HCAP (healthcare-associated pneumonia)  This is a 73 year old male admitted for pneumonia. 1.  Pneumonia: Healthcare associated as the patient has chronic obstructive disease as well as underlying MAC infection and has been resistant to treatment.  The patient has inhaled tobramycin on his home medication list that he has not yet started.   I spoke to pulmonology for consult. Dr. Jamal Collin suggested to start on IV meropenem for gram-negative superinfection. Also suggested to give chest physiotherapy, oral steroids daily and inhaled tobramycin. 2.  Sepsis: Patient meets criteria via tachycardia, tachypnea and leukocytosis.  He is hemodynamically stable.  Follow blood cultures for growth and sensitivities. 3.  MAC infection: manage per pulmonology. 4.  Hyponatremia: Secondary to chronic lung infection.  Hydrate with saline. 5.  DVT prophylaxis: Lovenox 6.  GI prophylaxis: None     All the records are reviewed and case discussed with Care Management/Social Workerr. Management plans discussed with the patient, family and they are in agreement.  CODE STATUS: Full.  TOTAL TIME TAKING CARE OF THIS PATIENT: 35 minutes.     POSSIBLE D/C IN 2-3 DAYS, DEPENDING ON CLINICAL CONDITION.   [name] M.D on 02/09/2019   Between 7am to 6pm - Pager - [phone]  After 6pm go to [email or web address] - password [email or web address] [facility]  Office  [phone]  CC: Primary care physician; Asencion Noble, MD  Note: This dictation was prepared with Dragon dictation along with smaller phrase technology. Any transcriptional errors that result from this process are unintentional.

## 2019-02-09 NOTE — Telephone Encounter (Signed)
Pharmacy called wanting to know if the patient needs his Tobramycin every month or every other month. Prescription needs to state continuous or every other month. They are sending it out to the patient today. Fax new prescription to [phone].  DS please advise.

## 2019-02-09 NOTE — Telephone Encounter (Signed)
New Rx printed and placed in DS box for signing.

## 2019-02-09 NOTE — Telephone Encounter (Signed)
Patient spouse calling States that they followed the instructions given on virtual visit but patient conditioned worsened EMS took patient to ED - had to cancel ECHO for tomorrow  Patient spouse would like to speak with nurse Please call to discuss

## 2019-02-09 NOTE — ED Provider Notes (Signed)
[facility] Emergency Department Provider Note   ____________________________________________   First MD Initiated Contact with Patient 02/09/19 0210     (approximate)  I have reviewed the triage vital signs and the nursing notes.   HISTORY  Chief Complaint Shortness of Breath  Level V caveat: Limited by respiratory distress  HPI [name] is a 73 y.o. male brought to the ED via EMS from home with a chief complaint of respiratory distress and fever.  Patient was seen for same 2 weeks ago, tested COVID negative and prescribed antibiotics and steroids.  Reports worsening shortness of breath and fever tonight along with productive cough.  Denies chest pain, abdominal pain, nausea or vomiting.  Denies recent travel, trauma or exposure to persons diagnosed with coronavirus.       Past Medical History:  Diagnosis Date  . Acute MI [facility])    pt denies  . Arthritis of lumbar spine   . Asthma   . Clotting disorder ([place])   . Collapsed lung    x3  . COPD (chronic obstructive pulmonary disease) ([place])   . Dyspnea   . History of benign thyroid tumor   . Lung cancer ([place])   . Mycobacterium avium complex ([place]) 06/24/2016  . Pneumonia   . Pneumothorax   . Small cell carcinoma of lung ([place])   . Spontaneous pneumothorax     Patient Active Problem List   Diagnosis Date Noted  . Atherosclerosis of native coronary artery of native heart with stable angina pectoris ([place]) 04/09/2018  . Iron deficiency anemia due to chronic blood loss 09/15/2016  . Palliative care encounter   . DNR (do not resuscitate) discussion   . Protein-calorie malnutrition, severe 07/24/2016  . Respiratory failure ([place])   . Acute respiratory failure ([place]) 07/22/2016  . Iron deficiency anemia 07/21/2016  . General weakness   . Hyponatremia 07/20/2016  . COPD exacerbation ([place]) 06/28/2016  . Mycobacterium avium complex ([place]) 06/24/2016  . Bronchiectasis with acute lower respiratory  infection ([place]) 05/20/2016  . Lung disease, bullous ([place]) 05/19/2016  . Peripheral edema 12/30/2014  . Encounter for therapeutic drug monitoring 03/15/2014  . Mediastinal adenopathy 01/26/2014  . Tick bite of axillary region 12/26/2013  . Pulmonary embolus ([place]) 12/22/2013  . PNA (pneumonia) 12/22/2013  . Hyperlipidemia 12/26/2012  . NSTEMI (non-ST elevated myocardial infarction) ([place]) 06/03/2012  . HTN (hypertension) 06/03/2012  . Adenocarcinoma of left lung ([place]) 04/30/2008  . DYSPNEA 11/18/2007  . COPD mixed type ([place]) 11/06/2007  . SPONTANEOUS PNEUMOTHORAX 10/31/2007    Past Surgical History:  Procedure Laterality Date  . APPENDECTOMY    . benign thryoid nodule resected    . CARDIAC CATHETERIZATION  2013   [facility]  . CHEST TUBE INSERTION    . COLONOSCOPY N/A 08/30/2015   Procedure: COLONOSCOPY;  Surgeon: Rogene Houston, MD;  Location: [facility] ENDO SUITE;  Service: Endoscopy;  Laterality: N/A;  1:45  . left upper lobectomy for small cell lung cancer    . VATS R thoracotomy      Prior to Admission medications   Medication Sig Start Date End Date Taking? Authorizing Provider  acetaminophen (TYLENOL) 325 MG tablet Take 650 mg by mouth as needed.     Yes [provider]  albuterol (PROVENTIL HFA;VENTOLIN HFA) 108 (90 Base) MCG/ACT inhaler Inhale 2 puffs into the lungs every 6 (six) hours as needed for wheezing or shortness of breath. 03/02/16  Yes Baird Lyons D, MD  albuterol (PROVENTIL) (2.5 MG/3ML) 0.083%  nebulizer solution Take 3 mLs (2.5 mg total) by nebulization 2 (two) times daily. With chest percussion treatment. May also use every 6 hrs as needed for increased shortness of breath, wheezing, chest tightness 11/23/18  Yes Wilhelmina Mcardle, MD  aspirin 81 MG EC tablet Take 81 mg by mouth daily. Swallow whole.   Yes [provider]  atorvastatin (LIPITOR) 10 MG tablet TAKE 1 TABLET BY MOUTH EVERY DAY 06/17/15  Yes Gollan, Kathlene November, MD  furosemide (LASIX) 20 MG tablet  Take 20 mg by mouth daily.    Yes [provider]  guaiFENesin (MUCINEX) 600 MG 12 hr tablet Take 600 mg by mouth daily.    Yes [provider]  KLOR-CON M20 20 MEQ tablet Take 20 mEq by mouth daily. 11/03/18  Yes [provider]  moxifloxacin (VIGAMOX) 0.5 % ophthalmic solution Apply to eye as directed. 01/31/19  Yes [provider]  prednisoLONE acetate (PRED FORTE) 1 % ophthalmic suspension Place 1 drop into the left eye 4 (four) times daily.  10/19/17  Yes [provider]  tobramycin, PF, (TOBI) 300 MG/5ML nebulizer solution Take 5 mLs (300 mg total) by nebulization every 12 (twelve) hours. Patient taking differently: Take 300 mg by nebulization every 12 (twelve) hours. Not started yet 01/24/19  Yes Wilhelmina Mcardle, MD  traMADol (ULTRAM) 50 MG tablet Take 1 tablet (50 mg total) by mouth every 6 (six) hours as needed. Takes 2 tablets 4 times daily as needed for pain. 07/29/16  Yes [name], MD    Allergies Neomy-bacit-polymyx-pramoxine; Other; and Codeine  Family History  Problem Relation Age of Onset  . Heart disease Mother   . Colon cancer Father   . Heart failure Brother     Social History Social History   Tobacco Use  . Smoking status: Former Smoker    Packs/day: 3.00    Years: 30.00    Pack years: 90.00    Types: Cigars    Last attempt to quit: 03/02/1992    Years since quitting: 26.9  . Smokeless tobacco: Never Used  . Tobacco comment: quit 22 yrs ago  Substance Use Topics  . Alcohol use: No    Alcohol/week: 0.0 standard drinks  . Drug use: No    Review of Systems  Constitutional: Positive for fever/chills Eyes: No visual changes. ENT: No sore throat. Cardiovascular: denies chest pain. Respiratory: Positive for productive cough and shortness of breath. Gastrointestinal: No abdominal pain.  No nausea, no vomiting.  No diarrhea.  No constipation. Genitourinary: Negative for dysuria. Musculoskeletal: Negative for back  pain. Skin: Negative for rash. Neurological: Negative for headaches, focal weakness or numbness.   ____________________________________________   PHYSICAL EXAM:  VITAL SIGNS: ED Triage Vitals  Enc Vitals Group     BP --      Pulse --      Resp --      Temp --      Temp src --      SpO2 02/09/19 0159 96 %     Weight 02/09/19 0203 143 lb 12.8 oz (65.2 kg)     Height 02/09/19 0203 _0  (1.702 m)     Head Circumference --      Peak Flow --      Pain Score 02/09/19 0203 0     Pain Loc --      Pain Edu? --      Excl. in [place]? --     Constitutional: Alert and oriented.  Ill  appearing and in moderate acute distress. Eyes: Bruising to right eye; left eye shield intact status post surgery  Head: Atraumatic. Nose: No congestion/rhinnorhea. Mouth/Throat: Mucous membranes are moist.  Oropharynx non-erythematous. Neck: No stridor.   Cardiovascular: Normal rate, regular rhythm. Grossly normal heart sounds.  Good peripheral circulation. Respiratory: Increased respiratory effort.  No retractions. Lungs audible rales and rhonchi. Gastrointestinal: Soft and nontender. No distention. No abdominal bruits. No CVA tenderness. Musculoskeletal: No lower extremity tenderness.  3+ BLE pitting edema.  No joint effusions. Neurologic:  Normal speech and language. No gross focal neurologic deficits are appreciated.  Skin:  Skin is warm, dry and intact. No rash noted. Psychiatric: Mood and affect are normal. Speech and behavior are normal.  ____________________________________________   LABS (all labs ordered are listed, but only abnormal results are displayed)  Labs Reviewed  COMPREHENSIVE METABOLIC PANEL - Abnormal; Notable for the following components:      Result Value   Sodium 131 (*)    Chloride 95 (*)    Glucose, Bld 129 (*)    Calcium 8.2 (*)    Total Protein 6.4 (*)    Albumin 2.7 (*)    All other components within normal limits  CBC WITH DIFFERENTIAL/PLATELET - Abnormal; Notable  for the following components:   WBC 13.0 (*)    RBC 3.52 (*)    Hemoglobin 9.8 (*)    HCT 29.9 (*)    Platelets 511 (*)    Neutro Abs 10.3 (*)    Lymphs Abs 0.6 (*)    Monocytes Absolute 1.9 (*)    Abs Immature Granulocytes 0.09 (*)    All other components within normal limits  PROTIME-INR - Abnormal; Notable for the following components:   Prothrombin Time 15.3 (*)    All other components within normal limits  URINALYSIS, COMPLETE (UACMP) WITH MICROSCOPIC - Abnormal; Notable for the following components:   Color, Urine YELLOW (*)    APPearance CLEAR (*)    Hgb urine dipstick SMALL (*)    Protein, ur 100 (*)    All other components within normal limits  SARS CORONAVIRUS 2 (HOSPITAL ORDER, [facility])  CULTURE, BLOOD (ROUTINE X 2)  CULTURE, BLOOD (ROUTINE X 2)  LACTIC ACID, PLASMA  TROPONIN I   ____________________________________________  EKG  ED ECG REPORT I, , J, the attending physician, personally viewed and interpreted this ECG.   Date: 02/09/2019  EKG Time: 0215  Rate: 106  Rhythm: sinus tachycardia  Axis: Normal  Intervals:none  ST&T Change: Nonspecific  ____________________________________________  RADIOLOGY  ED MD interpretation: Stable volume loss and opacities throughout left hemothorax  Official radiology report(s): Dg Chest Port 1 View  Result Date: 02/09/2019 CLINICAL DATA:  Shortness of breath. Cough and fever. EXAM: PORTABLE CHEST 1 VIEW COMPARISON:  Radiograph 01/15/2019, additional priors. Most recent chest CT 06/28/2016 FINDINGS: Again seen volume loss and pleuroparenchymal opacities throughout the left hemithorax. Surgical clips at the left hilum. Right lung is hyperinflated with emphysema. Chain sutures noted at the right lung apex. No acute airspace disease. Unchanged heart size and mediastinal contours with chronic leftward mediastinal shift. No pneumothorax. Chronic superior migration of the left humeral head  consistent with underlying rotator cuff arthropathy. IMPRESSION: 1. Unchanged radiographic appearance of the chest. 2. Chronic volume loss and pleuroparenchymal opacities throughout the left hemithorax, unchanged from prior exam. 3. Emphysema. Electronically Signed   By: Keith Rake M.D.   On: 02/09/2019 03:03    ____________________________________________   PROCEDURES  Procedure(s) performed (including Critical Care):  Procedures  CRITICAL CARE Performed by: Paulette Blanch   Total critical care time: 30 minutes  Critical care time was exclusive of separately billable procedures and treating other patients.  Critical care was necessary to treat or prevent imminent or life-threatening deterioration.  Critical care was time spent personally by me on the following activities: development of treatment plan with patient and/or surrogate as well as nursing, discussions with consultants, evaluation of patient's response to treatment, examination of patient, obtaining history from patient or surrogate, ordering and performing treatments and interventions, ordering and review of laboratory studies, ordering and review of radiographic studies, pulse oximetry and re-evaluation of patient's condition. ____________________________________________   INITIAL IMPRESSION / ASSESSMENT AND PLAN / ED COURSE  As part of my medical decision making, I reviewed the following data within the [place] notes reviewed and incorporated, Labs reviewed, EKG interpreted, Old chart reviewed, Radiograph reviewed, Discussed with admitting physician Dr. Marcille Blanco and Notes from prior ED visits     July Nickson Spiewak was evaluated in Emergency Department on 02/09/2019 for the symptoms described in the history of present illness. He was evaluated in the context of the global COVID-19 pandemic, which necessitated consideration that the patient might be at risk for infection with the SARS-CoV-2 virus  that causes COVID-19. Institutional protocols and algorithms that pertain to the evaluation of patients at risk for COVID-19 are in a state of rapid change based on information released by regulatory bodies including the CDC and federal and state organizations. These policies and algorithms were followed during the patient's care in the ED.   73 year old male who presents with respiratory distress, fever and productive cough. Differential includes, but is not limited to, viral syndrome, bronchitis including COPD exacerbation, pneumonia, reactive airway disease including asthma, CHF including exacerbation with or without pulmonary/interstitial edema, pneumothorax, ACS, thoracic trauma, and pulmonary embolism.  Will initiate lab work for sepsis as well as COVID testing.  Chest x-ray.  Tylenol for fever.  Anticipate hospitalization.   Clinical Course as of Feb 08 353  Thu Feb 09, 2019  0230 Will check rectal temperature.  I personally viewed patient's chest x-ray which is concerning for left-sided pneumonia.  Hold IV fluids for now in the event patient is COVID positive and also for 3+ pitting edema BLE.   [JS]  [id number] Patient is covered negative.  Will administer IV Solu-Medrol and nebulizer treatment at this time.  We will also give a dose of IV Lasix.  Discussed with hospitalist Dr. Marcille Blanco to evaluate patient in the emergency department for admission.   [JS]    Clinical Course User Index [JS] Paulette Blanch, MD     ____________________________________________   FINAL CLINICAL IMPRESSION(S) / ED DIAGNOSES  Final diagnoses:  COPD exacerbation ([place])  Acute on chronic congestive heart failure, unspecified heart failure type [facility])  Community acquired pneumonia of left lung, unspecified part of lung     ED Discharge Orders    None       Note:  This document was prepared using Dragon voice recognition software and may include unintentional dictation errors.   Paulette Blanch, MD 02/09/19  [phone]

## 2019-02-09 NOTE — Addendum Note (Signed)
Addended by: Darreld Mclean on: 02/09/2019 01:51 PM   Modules accepted: Orders

## 2019-02-09 NOTE — ED Notes (Signed)
ED TO INPATIENT HANDOFF REPORT  ED Nurse Name and Phone #: Butch  S Name/Age/Gender [name] 73 y.o. male Room/Bed: ED06A/ED06A  Code Status   Code Status: Prior  Home/SNF/Other Home Patient oriented to: self, place, time and situation Is this baseline? Yes   Triage Complete: Triage complete  Chief Complaint shortness of breath   Triage Note Pt arrives to ED via [facility] from home with c/o [facility]. Pt was seen here for same 2 weeks ago, r/x'd ABX and Steroids for PNA. Pt reports being brought here again d/t worsening s/x's, productive cough, and oral temp of 102.6. Dr Beather Arbour at bedside upon pt's arrival to ED.   Allergies Allergies  Allergen Reactions  . Neomy-Bacit-Polymyx-Pramoxine Anaphylaxis  . Other Other (See Comments)    Hydromet cough syrup-causes GI upset  . Codeine Nausea And Vomiting    Level of Care/Admitting Diagnosis ED Disposition    ED Disposition Condition [place] [facility]: [place] [100120]  Level of Care: Med-Surg [16]  Covid Evaluation: Screening Protocol (No Symptoms)  Diagnosis: CAP (community acquired pneumonia) [174081]  Admitting Physician: Harrie Foreman [[id number]]  Attending Physician: Harrie Foreman [[id number]]  Estimated length of stay: past midnight tomorrow  Certification:: I certify this patient will need inpatient services for at least 2 midnights  PT Class (Do Not Modify): Inpatient [101]  PT Acc Code (Do Not Modify): Private [1]       B Medical/Surgery History Past Medical History:  Diagnosis Date  . Acute MI [facility])    pt denies  . Arthritis of lumbar spine   . Asthma   . Clotting disorder ([place])   . Collapsed lung    x3  . COPD (chronic obstructive pulmonary disease) ([place])   . Dyspnea   . History of benign thyroid tumor   . Lung cancer ([place])   . Mycobacterium avium complex ([place]) 06/24/2016  . Pneumonia   . Pneumothorax   . Small cell carcinoma of lung ([place])   . Spontaneous  pneumothorax    Past Surgical History:  Procedure Laterality Date  . APPENDECTOMY    . benign thryoid nodule resected    . CARDIAC CATHETERIZATION  2013   [facility]  . CHEST TUBE INSERTION    . COLONOSCOPY N/A 08/30/2015   Procedure: COLONOSCOPY;  Surgeon: Rogene Houston, MD;  Location: [facility];  Service: Endoscopy;  Laterality: N/A;  1:45  . left upper lobectomy for small cell lung cancer    . VATS R thoracotomy       A IV Location/Drains/Wounds Patient Lines/Drains/Airways Status   Active Line/Drains/Airways    Name:   Placement date:   Placement time:   Site:   Days:   Peripheral IV 02/09/19 Left Forearm   02/09/19    0159    Forearm   less than 1   Peripheral IV 02/09/19 Right Antecubital   02/09/19    0229    Antecubital   less than 1          Intake/Output Last 24 hours  Intake/Output Summary (Last 24 hours) at 02/09/2019 0445 Last data filed at 02/09/2019 0426 Gross per 24 hour  Intake 250 ml  Output -  Net 250 ml    Labs/Imaging Results for orders placed or performed during the hospital encounter of 02/09/19 (from the past 48 hour(s))  Comprehensive metabolic panel     Status: Abnormal   Collection Time: 02/09/19  2:14 AM  Result  Value Ref Range   Sodium 131 (L) 135 - 145 mmol/L   Potassium 3.5 3.5 - 5.1 mmol/L   Chloride 95 (L) 98 - 111 mmol/L   CO2 24 22 - 32 mmol/L   Glucose, Bld 129 (H) 70 - 99 mg/dL   BUN 20 8 - 23 mg/dL   Creatinine, Ser 1.16 0.61 - 1.24 mg/dL   Calcium 8.2 (L) 8.9 - 10.3 mg/dL   Total Protein 6.4 (L) 6.5 - 8.1 g/dL   Albumin 2.7 (L) 3.5 - 5.0 g/dL   AST 24 15 - 41 U/L   ALT 21 0 - 44 U/L   Alkaline Phosphatase 68 38 - 126 U/L   Total Bilirubin 0.8 0.3 - 1.2 mg/dL   GFR calc non Af Amer >60 >60 mL/min   GFR calc Af Amer >60 >60 mL/min   Anion gap 12 5 - 15    Comment: Performed at [facility], [address] [place], [place] [address]  Lactic acid, plasma     Status: None   Collection Time: 02/09/19  2:14 AM   Result Value Ref Range   Lactic Acid, Venous 1.6 0.5 - 1.9 mmol/L    Comment: Performed at [facility], [address] [place], [place] [address]  CBC with Differential     Status: Abnormal   Collection Time: 02/09/19  2:14 AM  Result Value Ref Range   WBC 13.0 (H) 4.0 - 10.5 K/uL   RBC 3.52 (L) 4.22 - 5.81 MIL/uL   Hemoglobin 9.8 (L) 13.0 - 17.0 g/dL   HCT 29.9 (L) 39.0 - 52.0 %   MCV 84.9 80.0 - 100.0 fL   MCH 27.8 26.0 - 34.0 pg   MCHC 32.8 30.0 - 36.0 g/dL   RDW 14.5 11.5 - 15.5 %   Platelets 511 (H) 150 - 400 K/uL   nRBC 0.0 0.0 - 0.2 %   Neutrophils Relative % 78 %   Neutro Abs 10.3 (H) 1.7 - 7.7 K/uL   Lymphocytes Relative 5 %   Lymphs Abs 0.6 (L) 0.7 - 4.0 K/uL   Monocytes Relative 15 %   Monocytes Absolute 1.9 (H) 0.1 - 1.0 K/uL   Eosinophils Relative 0 %   Eosinophils Absolute 0.1 0.0 - 0.5 K/uL   Basophils Relative 1 %   Basophils Absolute 0.1 0.0 - 0.1 K/uL   Immature Granulocytes 1 %   Abs Immature Granulocytes 0.09 (H) 0.00 - 0.07 K/uL    Comment: Performed at [facility], [address] [place], [place] [address]  Protime-INR     Status: Abnormal   Collection Time: 02/09/19  2:14 AM  Result Value Ref Range   Prothrombin Time 15.3 (H) 11.4 - 15.2 seconds   INR 1.2 0.8 - 1.2    Comment: (NOTE) INR goal varies based on device and disease states. Performed at [facility], [address] [place], [place] [address]   Troponin I - Once     Status: None   Collection Time: 02/09/19  2:14 AM  Result Value Ref Range   Troponin I <0.03 <0.03 ng/mL    Comment: Performed at [facility], [address] [place], [place] [address]  SARS Coronavirus 2 (CEPHEID- Performed in [facility] [place] [facility] hospital lab), Hosp Order     Status: None   Collection Time: 02/09/19  2:18 AM  Result Value Ref Range   SARS Coronavirus 2 NEGATIVE NEGATIVE    Comment: (NOTE) If result is NEGATIVE SARS-CoV-2  target nucleic acids are NOT DETECTED. The  SARS-CoV-2 RNA is generally detectable in upper and lower  respiratory specimens during the acute phase of infection. The lowest  concentration of SARS-CoV-2 viral copies this assay can detect is 250  copies / mL. A negative result does not preclude SARS-CoV-2 infection  and should not be used as the sole basis for treatment or other  patient management decisions.  A negative result may occur with  improper specimen collection / handling, submission of specimen other  than nasopharyngeal swab, presence of viral mutation(s) within the  areas targeted by this assay, and inadequate number of viral copies  (<250 copies / mL). A negative result must be combined with clinical  observations, patient history, and epidemiological information. If result is POSITIVE SARS-CoV-2 target nucleic acids are DETECTED. The SARS-CoV-2 RNA is generally detectable in upper and lower  respiratory specimens dur ing the acute phase of infection.  Positive  results are indicative of active infection with SARS-CoV-2.  Clinical  correlation with patient history and other diagnostic information is  necessary to determine patient infection status.  Positive results do  not rule out bacterial infection or co-infection with other viruses. If result is PRESUMPTIVE POSTIVE SARS-CoV-2 nucleic acids MAY BE PRESENT.   A presumptive positive result was obtained on the submitted specimen  and confirmed on repeat testing.  While 2019 novel coronavirus  (SARS-CoV-2) nucleic acids may be present in the submitted sample  additional confirmatory testing may be necessary for epidemiological  and / or clinical management purposes  to differentiate between  SARS-CoV-2 and other Sarbecovirus currently known to infect humans.  If clinically indicated additional testing with an alternate test  methodology [phone]) is advised. The SARS-CoV-2 RNA is generally  detectable in upper and lower respiratory sp ecimens during the acute   phase of infection. The expected result is Negative. Fact Sheet for Patients:  StrictlyIdeas.no Fact Sheet for Healthcare Providers: BankingDealers.co.za This test is not yet approved or cleared by the [place] FDA and has been authorized for detection and/or diagnosis of SARS-CoV-2 by FDA under an Emergency Use Authorization (EUA).  This EUA will remain in effect (meaning this test can be used) for the duration of the COVID-19 declaration under Section 564(b)(1) of the Act, 21 U.S.C. section 360bbb-3(b)(1), unless the authorization is terminated or revoked sooner. Performed at [facility], [address] [place], [place] [address]   Urinalysis, Complete w Microscopic     Status: Abnormal   Collection Time: 02/09/19  2:41 AM  Result Value Ref Range   Color, Urine YELLOW (A) YELLOW   APPearance CLEAR (A) CLEAR   Specific Gravity, Urine 1.016 1.005 - 1.030   pH 5.0 5.0 - 8.0   Glucose, UA NEGATIVE NEGATIVE mg/dL   Hgb urine dipstick SMALL (A) NEGATIVE   Bilirubin Urine NEGATIVE NEGATIVE   Ketones, ur NEGATIVE NEGATIVE mg/dL   Protein, ur 100 (A) NEGATIVE mg/dL   Nitrite NEGATIVE NEGATIVE   Leukocytes,Ua NEGATIVE NEGATIVE   RBC / HPF 6-10 0 - 5 RBC/hpf   WBC, UA 0-5 0 - 5 WBC/hpf   Bacteria, UA NONE SEEN NONE SEEN   Squamous Epithelial / LPF NONE SEEN 0 - 5   Mucus PRESENT     Comment: Performed at [facility], [address], [place],  [address]   Dg Chest Port 1 View  Result Date: 02/09/2019 CLINICAL DATA:  Shortness of breath. Cough and fever. EXAM: PORTABLE CHEST 1 VIEW COMPARISON:  Radiograph 01/15/2019, additional priors. Most recent chest CT 06/28/2016 FINDINGS: Again seen volume loss and pleuroparenchymal opacities throughout the left hemithorax. Surgical clips at the left hilum. Right lung is hyperinflated with emphysema. Chain sutures noted at the right lung apex. No acute airspace disease.  Unchanged heart size and mediastinal contours with chronic leftward mediastinal shift. No pneumothorax. Chronic superior migration of the left humeral head consistent with underlying rotator cuff arthropathy. IMPRESSION: 1. Unchanged radiographic appearance of the chest. 2. Chronic volume loss and pleuroparenchymal opacities throughout the left hemithorax, unchanged from prior exam. 3. Emphysema. Electronically Signed   By: Keith Rake M.D.   On: 02/09/2019 03:03    Pending Labs Unresulted Labs (From admission, onward)    Start     Ordered   02/09/19 0210  Culture, blood (Routine x 2)  BLOOD CULTURE X 2,   STAT     02/09/19 0209   Signed and Held  Creatinine, serum  (enoxaparin (LOVENOX)    CrCl >/= 30 ml/min)  Weekly,   R    Comments:  while on enoxaparin therapy    Signed and Held   Signed and Held  TSH  Add-on,   R     Signed and Held   Signed and Held  Hemoglobin A1c  Add-on,   R     Signed and Held          Vitals/Pain Today's Vitals   02/09/19 0159 02/09/19 0203 02/09/19 0219 02/09/19 0252  BP:   140/67 119/85  Pulse:   (!) 108 (!) 109  Resp:   (!) 24 (!) 26  Temp:   99.9 F (37.7 C) 99.9 F (37.7 C)  TempSrc:   Oral Rectal  SpO2: 96%  95% 94%  Weight:  65.2 kg    Height:  _0  (1.702 m)    PainSc:  0-No pain      Isolation Precautions Droplet and Contact precautions  Medications Medications  cefTRIAXone (ROCEPHIN) 2 g in sodium chloride 0.9 % 100 mL IVPB (0 g Intravenous Stopped 02/09/19 0400)  azithromycin (ZITHROMAX) 500 mg in sodium chloride 0.9 % 250 mL IVPB (0 mg Intravenous Stopped 02/09/19 0426)  methylPREDNISolone sodium succinate (SOLU-MEDROL) 125 mg/2 mL injection 125 mg (has no administration in time range)  ipratropium-albuterol (DUONEB) 0.5-2.5 (3) MG/3ML nebulizer solution 3 mL (has no administration in time range)  furosemide (LASIX) injection 20 mg (has no administration in time range)    Mobility walks with device Moderate fall risk    Focused Assessments Pulmonary Assessment Handoff:  Lung sounds:   O2 Device: Room Air        R Recommendations: See Admitting Provider Note  Report given to:   Additional Notes:

## 2019-02-09 NOTE — Telephone Encounter (Signed)
Every month  [name]

## 2019-02-09 NOTE — Consult Note (Signed)
Pharmacy Antibiotic Note  [name] is a 73 y.o. male admitted on 02/09/2019 with Gram Negative Superinfection.    Pharmacy has been consulted for Meropenem dosing.  Plan: Meropenem 1g q 8h  Height: 5\' 7"  (170.2 cm) Weight: 143 lb 12.8 oz (65.2 kg) IBW/kg (Calculated) : 66.1  Temp (24hrs), Avg:99.4 F (37.4 C), Min:98.4 F (36.9 C), Max:99.9 F (37.7 C)  Recent Labs  Lab 02/09/19 0214  WBC 13.0*  CREATININE 1.16  LATICACIDVEN 1.6    Estimated Creatinine Clearance: 53.1 mL/min (by C-G formula based on SCr of 1.16 mg/dL).    Allergies  Allergen Reactions  . Neomy-Bacit-Polymyx-Pramoxine Anaphylaxis  . Other Other (See Comments)    Hydromet cough syrup-causes GI upset  . Codeine Nausea And Vomiting    Antimicrobials this admission: 5/14 Azithromycin x 1 5/14 Ceftriaxone x 1 5/14 Meropenem  Dose adjustments this admission: None  Microbiology results: 5/14 BCx: pending   Thank you for allowing pharmacy to be a part of this patient's care.  Lu Duffel, PharmD, BCPS Clinical Pharmacist 02/09/2019 10:46 AM]\

## 2019-02-10 ENCOUNTER — Other Ambulatory Visit: Payer: Medicare Other

## 2019-02-10 MED ORDER — TOBRAMYCIN 300 MG/5ML IN NEBU
300.0000 mg | INHALATION_SOLUTION | Freq: Two times a day (BID) | RESPIRATORY_TRACT | Status: DC
  Administered 2019-02-10 – 2019-02-12 (×5): 300 mg via RESPIRATORY_TRACT
  Filled 2019-02-10 (×6): qty 5

## 2019-02-10 NOTE — Consult Note (Signed)
Pharmacy Antibiotic Note  Steven STEPANEK is a 73 y.o. male admitted on 02/09/2019 with Gram Negative Superinfection.    Pharmacy has been consulted for Meropenem dosing.  Plan: Continue Meropenem 1g q 8h  Height: 5\' 7"  (170.2 cm) Weight: 143 lb 12.8 oz (65.2 kg) IBW/kg (Calculated) : 66.1  Temp (24hrs), Avg:98.1 F (36.7 C), Min:97.5 F (36.4 C), Max:98.7 F (37.1 C)  Recent Labs  Lab 02/09/19 0214  WBC 13.0*  CREATININE 1.16  LATICACIDVEN 1.6    Estimated Creatinine Clearance: 53.1 mL/min (by C-G formula based on SCr of 1.16 mg/dL).    Allergies  Allergen Reactions  . Other Other (See Comments)    Hydromet cough syrup-causes GI upset  . Codeine Nausea And Vomiting    Antimicrobials this admission: 5/14 Azithromycin x 1 5/14 Ceftriaxone x 1 5/14 Meropenem >> 5/15 Inhaled tobramycin >>  Dose adjustments this admission: None  Microbiology results: 5/14 BCx: NG x 1 day  Thank you for allowing pharmacy to be a part of this patient's care.  Lu Duffel, PharmD, BCPS Clinical Pharmacist 02/10/2019 12:16 PM]\

## 2019-02-10 NOTE — Evaluation (Signed)
Physical Therapy Evaluation Patient Details Name: [name] MRN: [id number] DOB: 03-17-1946 Today's Date: 02/10/2019   History of Present Illness  Pt is a 73 y.o. male presenting to hospital 02/09/19 with SOB, respiratory distress, and fever (pt was in ED about 2 weeks prior with similar complaints).  (-) COVID test.  Pt admitted with PNA, MAC infection, and sepsis.  PMH includes acute MI, collapsed lung, COPD, lung CA, spontaneous pneumothorax, and VATS R thoracotomy.  Clinical Impression  Prior to hospital admission, pt reports being independent with ambulation.  Pt lives with his wife in 1 level home with steps to enter.  Currently pt is CGA with transfers and CGA to SBA ambulating with RW around nursing loop.  SOB noted with activity but O2 sats 95% or greater on room air with HR in 90's with activity.  Pt would benefit from skilled PT to address noted impairments and functional limitations (see below for any additional details).  Upon hospital discharge, recommend pt discharge with HHPT.    Follow Up Recommendations Home health PT    Equipment Recommendations  Rolling walker with 5" wheels;3in1 (PT)    Recommendations for Other Services       Precautions / Restrictions Precautions Precautions: Fall Restrictions Weight Bearing Restrictions: No      Mobility  Bed Mobility               General bed mobility comments: Deferred (pt up in chair at beginning and end of session)  Transfers Overall transfer level: Needs assistance Equipment used: Rolling walker (2 wheeled) Transfers: Sit to/from Stand Sit to Stand: Min guard         General transfer comment: fairly strong stand from recliner  Ambulation/Gait Ambulation/Gait assistance: Min guard;Supervision Gait Distance (Feet): 240 Feet Assistive device: Rolling walker (2 wheeled) Gait Pattern/deviations: Step-through pattern Gait velocity: mildly decreased   General Gait Details: steady with RW; SOB noted with  ambulation  Stairs            Wheelchair Mobility    Modified Rankin (Stroke Patients Only)       Balance Overall balance assessment: Needs assistance Sitting-balance support: No upper extremity supported;Feet supported Sitting balance-Leahy Scale: Normal Sitting balance - Comments: steady sitting reaching outside BOS   Standing balance support: No upper extremity supported Standing balance-Leahy Scale: Good Standing balance comment: steady standing urinating in toilet and also washing hands at sink                             Pertinent Vitals/Pain Pain Assessment: No/denies pain    Home Living Family/patient expects to be discharged to:: Private residence Living Arrangements: Spouse/significant other Available Help at Discharge: Family Type of Home: House Home Access: Stairs to enter   [employer] of Steps: 5 steps (from garage) with R railing; 4 steps from back entrance Home Layout: One level        Prior Function Level of Independence: Independent               Hand Dominance        Extremity/Trunk Assessment   Upper Extremity Assessment Upper Extremity Assessment: Generalized weakness    Lower Extremity Assessment Lower Extremity Assessment: Generalized weakness    Cervical / Trunk Assessment Cervical / Trunk Assessment: (forward shoulders and head)  Communication   Communication: No difficulties  Cognition Arousal/Alertness: Awake/alert Behavior During Therapy: WFL for tasks assessed/performed Overall Cognitive Status: Within Functional Limits for  tasks assessed                                        General Comments   Nursing cleared pt for participation in physical therapy.  Pt agreeable to PT session and requesting to walk.    Exercises     Assessment/Plan    PT Assessment Patient needs continued PT services  PT Problem List Decreased strength;Decreased activity tolerance;Decreased  balance;Decreased mobility;Decreased knowledge of use of DME;Cardiopulmonary status limiting activity       PT Treatment Interventions DME instruction;Gait training;Stair training;Functional mobility training;Therapeutic activities;Therapeutic exercise;Balance training;Patient/family education    PT Goals (Current goals can be found in the Care Plan section)  Acute Rehab PT Goals Patient Stated Goal: to go home PT Goal Formulation: With patient Time For Goal Achievement: 02/24/19 Potential to Achieve Goals: Good    Frequency Min 2X/week   Barriers to discharge        Co-evaluation               AM-PAC PT "6 Clicks" Mobility  Outcome Measure Help needed turning from your back to your side while in a flat bed without using bedrails?: A Little Help needed moving from lying on your back to sitting on the side of a flat bed without using bedrails?: A Little Help needed moving to and from a bed to a chair (including a wheelchair)?: A Little Help needed standing up from a chair using your arms (e.g., wheelchair or bedside chair)?: A Little Help needed to walk in hospital room?: A Little Help needed climbing 3-5 steps with a railing? : A Little 6 Click Score: 18    End of Session Equipment Utilized During Treatment: Gait belt Activity Tolerance: Patient tolerated treatment well Patient left: in chair;with call bell/phone within reach;with chair alarm set Nurse Communication: Mobility status;Precautions PT Visit Diagnosis: Other abnormalities of gait and mobility (R26.89);Muscle weakness (generalized) (M62.81);Difficulty in walking, not elsewhere classified (R26.2)    Time: 2694-8546 PT Time Calculation (min) (ACUTE ONLY): 28 min   Charges:   PT Evaluation $PT Eval Low Complexity: 1 Low PT Treatments $Therapeutic Exercise: 8-22 mins       Leitha Bleak, PT 02/10/19, 5:22 PM [phone]

## 2019-02-10 NOTE — Plan of Care (Signed)

## 2019-02-10 NOTE — Progress Notes (Signed)
[facility] [facility] [place] NAME: [name]    MR#:  [id number]  DATE OF BIRTH:  Mar 19, 1946  SUBJECTIVE:  CHIEF COMPLAINT:   Chief Complaint  Patient presents with  . Shortness of Breath   Patient is a known case of MAC, was advised to have tobramycin inhalation as outpatient but did not get approved by insurance so could not get it so far. Came with fever and cough.  REVIEW OF SYSTEMS:  CONSTITUTIONAL: have fever, fatigue or weakness.  EYES: No blurred or double vision.  EARS, NOSE, AND THROAT: No tinnitus or ear pain.  RESPIRATORY: ahve cough, shortness of breath, no wheezing or hemoptysis.  CARDIOVASCULAR: No chest pain, orthopnea, edema.  GASTROINTESTINAL: No nausea, vomiting, diarrhea or abdominal pain.  GENITOURINARY: No dysuria, hematuria.  ENDOCRINE: No polyuria, nocturia,  HEMATOLOGY: No anemia, easy bruising or bleeding SKIN: No rash or lesion. MUSCULOSKELETAL: No joint pain or arthritis.   NEUROLOGIC: No tingling, numbness, weakness.  PSYCHIATRY: No anxiety or depression.   ROS  DRUG ALLERGIES:   Allergies  Allergen Reactions  . Other Other (See Comments)    Hydromet cough syrup-causes GI upset  . Codeine Nausea And Vomiting    VITALS:  Blood pressure 126/65, pulse 91, temperature 98 F (36.7 C), temperature source Oral, resp. rate 17, height _0  (1.702 m), weight 65.2 kg, SpO2 99 %.  PHYSICAL EXAMINATION:  GENERAL:  73 y.o.-year-old patient lying in the bed with no acute distress.  EYES: Pupils equal, round, reactive to light and accommodation. No scleral icterus. Extraocular muscles intact.  HEENT: Head atraumatic, normocephalic. Oropharynx and nasopharynx clear.  NECK:  Supple, no jugular venous distention. No thyroid enlargement, no tenderness.  LUNGS: Normal breath sounds bilaterally, no wheezing,have crepitation. No use of accessory muscles of respiration.  CARDIOVASCULAR: S1, S2 normal. No murmurs, rubs, or  gallops.  ABDOMEN: Soft, nontender, nondistended. Bowel sounds present. No organomegaly or mass.  EXTREMITIES: No pedal edema, cyanosis, or clubbing.  NEUROLOGIC: Cranial nerves II through XII are intact. Muscle strength 4/5 in all extremities. Sensation intact. Gait not checked.  PSYCHIATRIC: The patient is alert and oriented x 3.  SKIN: No obvious rash, lesion, or ulcer.   Physical Exam LABORATORY PANEL:   CBC Recent Labs  Lab 02/09/19 0214  WBC 13.0*  HGB 9.8*  HCT 29.9*  PLT 511*   ------------------------------------------------------------------------------------------------------------------  Chemistries  Recent Labs  Lab 02/09/19 0214  NA 131*  K 3.5  CL 95*  CO2 24  GLUCOSE 129*  BUN 20  CREATININE 1.16  CALCIUM 8.2*  AST 24  ALT 21  ALKPHOS 68  BILITOT 0.8   ------------------------------------------------------------------------------------------------------------------  Cardiac Enzymes Recent Labs  Lab 02/09/19 0214  TROPONINI <0.03   ------------------------------------------------------------------------------------------------------------------  RADIOLOGY:  Dg Chest Port 1 View  Result Date: 02/09/2019 CLINICAL DATA:  Shortness of breath. Cough and fever. EXAM: PORTABLE CHEST 1 VIEW COMPARISON:  Radiograph 01/15/2019, additional priors. Most recent chest CT 06/28/2016 FINDINGS: Again seen volume loss and pleuroparenchymal opacities throughout the left hemithorax. Surgical clips at the left hilum. Right lung is hyperinflated with emphysema. Chain sutures noted at the right lung apex. No acute airspace disease. Unchanged heart size and mediastinal contours with chronic leftward mediastinal shift. No pneumothorax. Chronic superior migration of the left humeral head consistent with underlying rotator cuff arthropathy. IMPRESSION: 1. Unchanged radiographic appearance of the chest. 2. Chronic volume loss and pleuroparenchymal opacities throughout the left  hemithorax, unchanged from prior exam. 3. Emphysema.  Electronically Signed   By: Keith Rake M.D.   On: 02/09/2019 03:03    ASSESSMENT AND PLAN:   Active Problems:   CAP (community acquired pneumonia)   HCAP (healthcare-associated pneumonia)  This is a 73 year old male admitted for pneumonia. 1.  Pneumonia: Healthcare associated as the patient has chronic obstructive disease as well as underlying MAC infection and has been resistant to treatment.  The patient has inhaled tobramycin on his home medication list that he has not yet started.   I spoke to pulmonology for consult. Dr. Jamal Collin suggested to start on IV meropenem for gram-negative superinfection. Also suggested to give chest physiotherapy, oral steroids daily and inhaled tobramycin. 2.  Sepsis: Patient meets criteria via tachycardia, tachypnea and leukocytosis.  He is hemodynamically stable.  Follow blood cultures for growth and sensitivities. 3.  MAC infection: manage per pulmonology. 4.  Hyponatremia: Secondary to chronic lung infection.  Hydrate with saline. 5.  DVT prophylaxis: Lovenox 6.  GI prophylaxis: None   All the records are reviewed and case discussed with Care Management/Social Workerr. Management plans discussed with the patient, family and they are in agreement.  CODE STATUS: Full.  TOTAL TIME TAKING CARE OF THIS PATIENT: 35 minutes.     POSSIBLE D/C IN 2-3 DAYS, DEPENDING ON CLINICAL CONDITION.   [name] M.D on 02/10/2019   Between 7am to 6pm - Pager - [phone]  After 6pm go to [email or web address] - password [email or web address] [facility]  Office  [phone]  CC: Primary care physician; Asencion Noble, MD  Note: This dictation was prepared with Dragon dictation along with smaller phrase technology. Any transcriptional errors that result from this process are unintentional.

## 2019-02-11 ENCOUNTER — Inpatient Hospital Stay: Payer: Medicare Other

## 2019-02-11 LAB — BASIC METABOLIC PANEL
Anion gap: 11 (ref 5–15)
BUN: 33 mg/dL — ABNORMAL HIGH (ref 8–23)
CO2: 29 mmol/L (ref 22–32)
Calcium: 8.5 mg/dL — ABNORMAL LOW (ref 8.9–10.3)
Chloride: 93 mmol/L — ABNORMAL LOW (ref 98–111)
Creatinine, Ser: 1.15 mg/dL (ref 0.61–1.24)
GFR calc Af Amer: >60 mL/min (ref >60)
GFR calc non Af Amer: >60 mL/min (ref >60)
Glucose, Bld: 142 mg/dL — ABNORMAL HIGH (ref 70–99)
Potassium: 3 mmol/L — ABNORMAL LOW (ref 3.5–5.1)
Sodium: 133 mmol/L — ABNORMAL LOW (ref 135–145)

## 2019-02-11 LAB — CBC
HCT: 30.2 % — ABNORMAL LOW (ref 39.0–52.0)
Hemoglobin: 10 g/dL — ABNORMAL LOW (ref 13.0–17.0)
MCH: 28.2 pg (ref 26.0–34.0)
MCHC: 33.1 g/dL (ref 30.0–36.0)
MCV: 85.3 fL (ref 80.0–100.0)
Platelets: 514 10*3/uL — ABNORMAL HIGH (ref 150–400)
RBC: 3.54 MIL/uL — ABNORMAL LOW (ref 4.22–5.81)
RDW: 14.2 % (ref 11.5–15.5)
WBC: 15.2 10*3/uL — ABNORMAL HIGH (ref 4.0–10.5)
nRBC: 0 % (ref 0.0–0.2)

## 2019-02-11 LAB — MAGNESIUM: Magnesium: 1.9 mg/dL (ref 1.7–2.4)

## 2019-02-11 MED ORDER — FUROSEMIDE 10 MG/ML IJ SOLN
20.0000 mg | Freq: Once | INTRAMUSCULAR | Status: AC
  Administered 2019-02-11: 02:00:00 20 mg via INTRAVENOUS
  Filled 2019-02-11: qty 2

## 2019-02-11 MED ORDER — POTASSIUM CHLORIDE CRYS ER 20 MEQ PO TBCR
40.0000 meq | EXTENDED_RELEASE_TABLET | Freq: Once | ORAL | Status: AC
  Administered 2019-02-11: 40 meq via ORAL
  Filled 2019-02-11: qty 2

## 2019-02-11 MED ORDER — FUROSEMIDE 20 MG PO TABS
20.0000 mg | ORAL_TABLET | Freq: Every day | ORAL | Status: DC
  Administered 2019-02-11 – 2019-02-12 (×2): 20 mg via ORAL
  Filled 2019-02-11 (×2): qty 1

## 2019-02-11 MED ORDER — IPRATROPIUM-ALBUTEROL 0.5-2.5 (3) MG/3ML IN SOLN
3.0000 mL | RESPIRATORY_TRACT | Status: DC | PRN
  Administered 2019-02-11 – 2019-02-12 (×3): 3 mL via RESPIRATORY_TRACT
  Filled 2019-02-11 (×4): qty 3

## 2019-02-11 MED ORDER — BENZONATATE 100 MG PO CAPS
200.0000 mg | ORAL_CAPSULE | Freq: Three times a day (TID) | ORAL | Status: DC | PRN
  Administered 2019-02-11 (×3): 200 mg via ORAL
  Filled 2019-02-11 (×3): qty 2

## 2019-02-11 NOTE — Progress Notes (Addendum)
Patient complaining of dyspnea, BP 136/68, HR 97, resp 22, ax temp 97.8. Applied 2L of 02, prn robitussin given with no relief. Patient has +4 pitting edema in bilateral lower extremities. Attempted to elevate his lower extremities, he stated it made him feel like he couldn't breathe more. Notified Dr. Jannifer Franklin on call, who stated he would put orders in. Will continue to monitor.

## 2019-02-11 NOTE — Progress Notes (Signed)
Administered 20mg  of lasix, duoneb tx, tessalon pearl cough suppressant, and 1 view chest x ray. Patient having non-productive cough. 02 sats 96% on 2L.  At bedside continuing to monitor.

## 2019-02-11 NOTE — Progress Notes (Addendum)
[facility] at [place] NAME: [name]    MR#:  [id number]  DATE OF BIRTH:  23-May-1946  SUBJECTIVE:   Pt. Here due to shortness of breath due to COPD with underlying MAC pneumonia.  Patient today complaining of some worsening lower extremity edema but no worsening shortness of breath.  No fever, cough but nonproductive.  REVIEW OF SYSTEMS:    Review of Systems  Constitutional: Negative for chills and fever.  HENT: Negative for congestion and tinnitus.   Eyes: Negative for blurred vision and double vision.  Respiratory: Positive for cough and shortness of breath. Negative for wheezing.   Cardiovascular: Negative for chest pain, orthopnea and PND.  Gastrointestinal: Negative for abdominal pain, diarrhea, nausea and vomiting.  Genitourinary: Negative for dysuria and hematuria.  Neurological: Negative for dizziness, sensory change and focal weakness.  All other systems reviewed and are negative.   Nutrition: Regular Tolerating Diet: Yes Tolerating PT: Eval noted.    DRUG ALLERGIES:   Allergies  Allergen Reactions   Other Other (See Comments)    Hydromet cough syrup-causes GI upset   Codeine Nausea And Vomiting    VITALS:  Blood pressure 138/61, pulse (!) 103, temperature 98.2 F (36.8 C), temperature source Oral, resp. rate 17, height _0  (1.702 m), weight 65.2 kg, SpO2 100 %.  PHYSICAL EXAMINATION:   Physical Exam  GENERAL:  73 y.o.-year-old patient sitting in in no acute distress.  EYES: Pupils equal, round, reactive to light and accommodation. No scleral icterus. Extraocular muscles intact.  HEENT: Head atraumatic, normocephalic. Oropharynx and nasopharynx clear.  NECK:  Supple, no jugular venous distention. No thyroid enlargement, no tenderness.  LUNGS: Prolonged inspiratory and expiratory phase, minimal rhonchi bilaterally.  No rales, wheezing.  Negative use of accessory muscles. CARDIOVASCULAR: S1, S2 normal. No  murmurs, rubs, or gallops.  ABDOMEN: Soft, nontender, nondistended. Bowel sounds present. No organomegaly or mass.  EXTREMITIES: No cyanosis, clubbing, + 2 edema b/l with chronic venous stasis b/l.   NEUROLOGIC: Cranial nerves II through XII are intact. No focal Motor or sensory deficits b/l.   PSYCHIATRIC: The patient is alert and oriented x 3.  SKIN: No obvious rash, lesion, or ulcer.    LABORATORY PANEL:   CBC Recent Labs  Lab 02/11/19 1039  WBC 15.2*  HGB 10.0*  HCT 30.2*  PLT 514*   ------------------------------------------------------------------------------------------------------------------  Chemistries  Recent Labs  Lab 02/09/19 0214 02/11/19 1039  NA 131* 133*  K 3.5 3.0*  CL 95* 93*  CO2 24 29  GLUCOSE 129* 142*  BUN 20 33*  CREATININE 1.16 1.15  CALCIUM 8.2* 8.5*  MG  --  1.9  AST 24  --   ALT 21  --   ALKPHOS 68  --   BILITOT 0.8  --    ------------------------------------------------------------------------------------------------------------------  Cardiac Enzymes Recent Labs  Lab 02/09/19 0214  TROPONINI <0.03   ------------------------------------------------------------------------------------------------------------------  RADIOLOGY:  Dg Chest Port 1 View  Result Date: 02/11/2019 CLINICAL DATA:  Shortness of breath, cough, pneumonia. EXAM: PORTABLE CHEST 1 VIEW COMPARISON:  Radiographs 2 days prior, 02/09/2019 FINDINGS: Development of patchy opacity in the periphery of the right lung base. Chronic volume loss and postsurgical change in the left hemithorax with diffusely increased density throughout the aerated left lung. Possible cavitary component at the left lung base which was seen on 2017 chest CT and chronic. Underlying right lung hyperinflation again seen with multiple chain sutures. Unchanged mediastinal contours, heart size is  obscured by postsurgical change. No pulmonary edema or pneumothorax. IMPRESSION: 1. Development of patchy  opacity in the periphery of the right lung base, atelectasis versus pneumonia. 2. Chronic volume loss and postsurgical change in the left hemithorax. Electronically Signed   By: Keith Rake M.D.   On: 02/11/2019 03:11     ASSESSMENT AND PLAN:   73 year old male with past medical history of COPD, history of lung cancer, asthma, hyperlipidemia who presented to the hospital due to shortness of breath.  1.  Acute respiratory failure with hypoxia-secondary to underlying COPD with now MAC pneumonia. -Continue O2 supplementation, continue duo nebs, inhaled tobramycin and empiric meropenem for now. -Wean off oxygen as tolerated.  2.  Pneumonia- due to Mycobacterium avium complex. - Patient followed by pulmonary as an outpatient.  He could not get his inhaled tobramycin due to insurance reasons. -Continue inhaled tobramycin, empiric meropenem for now. -We will discuss with pulmonary regarding longevity of treatment.  3.  Hypokalemia- will supplement orally. -Repeat level in the morning.  Check magnesium level which is normal.    4.  Leukocytosis- suspected to be secondary to pneumonia combined with underlying steroids. -We will continue to monitor.  5. Hyperlipidemia - cont. Atorvastatin  All the records are reviewed and case discussed with Care Management/Social Worker. Management plans discussed with the patient, family and they are in agreement.  CODE STATUS: Full code  DVT Prophylaxis: Lovenox  TOTAL TIME TAKING CARE OF THIS PATIENT: 30 minutes.   POSSIBLE D/C IN 2-3 DAYS, DEPENDING ON CLINICAL CONDITION.   Henreitta Leber M.D on 02/11/2019 at 1:53 PM  Between 7am to 6pm - Pager - [phone]  After 6pm go to [email or web address] - Proofreader  [facility]  Office  [phone]  CC: Primary care physician; Asencion Noble, MD

## 2019-02-11 NOTE — TOC Initial Note (Signed)
Transition of Care [facility]) - Initial/Assessment Note    Patient Details  Name: [name] MRN: [id number] Date of Birth: 1946/07/06  Transition of Care [facility]) CM/SW Contact:    Steven Maudlin, RN Phone Number: 02/11/2019, 10:21 AM  Clinical Narrative:  [facility] team met with patient to discuss PT recommendation for home health along with high risk readmissions screening. Patient loves with his wife [name], who we have included in this conversation. Patient lives at home with spouse, uses a rolling walker occasionally. Still mostly independent with activities of daily living. Spouse provides transport. Both are agreeable to home health. CMS Medicare.gov Compare Post Acute Care list reviewed with patient and since they have used [facility] in the past the prefer to use them again. Heads up to [name] about need for services once pt is discharged.                 Expected Discharge Plan: [place] Barriers to Discharge: Continued Medical Work up   Patient Goals and CMS Choice Patient states their goals for this hospitalization and ongoing recovery are:: to ge tthe fluid in my legs down CMS Medicare.gov Compare Post Acute Care list provided to:: Patient Choice offered to / list presented to : Patient  Expected Discharge Plan and Services Expected Discharge Plan: [place]   Discharge Planning Services: CM Consult Post Acute Care Choice: Durable Medical Equipment Living arrangements for the past 2 months: Single Family Home                           HH Arranged: PT HH Agency: [facility] (formerly [employer]) Date [place]: 02/11/19 Time [place]: 28 Representative spoke with at [place]: [name]  Prior Living Arrangements/Services Living arrangements for the past 2 months: [place]   Patient language and need for interpreter reviewed:: No Do you feel safe going back to the place where you  live?: Yes      Need for Family Participation in Patient Care: No (Comment)     Criminal Activity/Legal Involvement Pertinent to Current Situation/Hospitalization: No - Comment as needed  Activities of Daily Living Home Assistive Devices/Equipment: Walker (specify type)(rolling walker) ADL Screening (condition at time of admission) Patient's cognitive ability adequate to safely complete daily activities?: Yes Is the patient deaf or have difficulty hearing?: No Does the patient have difficulty seeing, even when wearing glasses/contacts?: No Does the patient have difficulty concentrating, remembering, or making decisions?: No Patient able to express need for assistance with ADLs?: Yes Does the patient have difficulty dressing or bathing?: No Independently performs ADLs?: Yes (appropriate for developmental age) Does the patient have difficulty walking or climbing stairs?: No Weakness of Legs: None Weakness of Arms/Hands: None  Permission Sought/Granted                  Emotional Assessment Appearance:: Appears stated age Attitude/Demeanor/Rapport: Engaged, Ambitious   Orientation: : Oriented to Self, Oriented to Place, Oriented to  Time, Oriented to Situation      Admission diagnosis:  COPD exacerbation ([place]) [J44.1] Community acquired pneumonia of left lung, unspecified part of lung [J18.9] Acute on chronic congestive heart failure, unspecified heart failure type ([place]) [I50.9] HCAP (healthcare-associated pneumonia) [J18.9] Patient Active Problem List   Diagnosis Date Noted  . CAP (community acquired pneumonia) 02/09/2019  . HCAP (healthcare-associated pneumonia) 02/09/2019  . Atherosclerosis of native coronary artery  of native heart with stable angina pectoris ([place]) 04/09/2018  . Iron deficiency anemia due to chronic blood loss 09/15/2016  . Palliative care encounter   . DNR (do not resuscitate) discussion   . Protein-calorie malnutrition, severe 07/24/2016  .  Respiratory failure ([place])   . Acute respiratory failure ([place]) 07/22/2016  . Iron deficiency anemia 07/21/2016  . General weakness   . Hyponatremia 07/20/2016  . COPD exacerbation ([place]) 06/28/2016  . Mycobacterium avium complex ([place]) 06/24/2016  . Bronchiectasis with acute lower respiratory infection ([place]) 05/20/2016  . Lung disease, bullous ([place]) 05/19/2016  . Peripheral edema 12/30/2014  . Encounter for therapeutic drug monitoring 03/15/2014  . Mediastinal adenopathy 01/26/2014  . Tick bite of axillary region 12/26/2013  . Pulmonary embolus ([place]) 12/22/2013  . PNA (pneumonia) 12/22/2013  . Hyperlipidemia 12/26/2012  . NSTEMI (non-ST elevated myocardial infarction) ([place]) 06/03/2012  . HTN (hypertension) 06/03/2012  . Adenocarcinoma of left lung ([place]) 04/30/2008  . DYSPNEA 11/18/2007  . COPD mixed type ([place]) 11/06/2007  . SPONTANEOUS PNEUMOTHORAX 10/31/2007   PCP:  Asencion Noble, MD Pharmacy:   [id number] [place] [name]: [phone]: [id number]: [phone]: [phone]    Social Determinants of Health (SDOH) Interventions    Readmission Risk Interventions Readmission Risk Prevention Plan 02/11/2019 02/10/2019  Transportation Screening Complete Complete  Home Care Screening - Complete  Medication Review (RN CM) - Complete  HRI or Home Care Consult Complete -  Palliative Care Screening Patient Refused -  Some recent data might be hidden

## 2019-02-11 NOTE — Plan of Care (Signed)

## 2019-02-12 LAB — CBC
HCT: 29.4 % — ABNORMAL LOW (ref 39.0–52.0)
Hemoglobin: 9.4 g/dL — ABNORMAL LOW (ref 13.0–17.0)
MCH: 27.6 pg (ref 26.0–34.0)
MCHC: 32 g/dL (ref 30.0–36.0)
MCV: 86.5 fL (ref 80.0–100.0)
Platelets: 523 10*3/uL — ABNORMAL HIGH (ref 150–400)
RBC: 3.4 MIL/uL — ABNORMAL LOW (ref 4.22–5.81)
RDW: 14.3 % (ref 11.5–15.5)
WBC: 16.7 10*3/uL — ABNORMAL HIGH (ref 4.0–10.5)
nRBC: 0 % (ref 0.0–0.2)

## 2019-02-12 LAB — CREATININE, SERUM
Creatinine, Ser: 0.96 mg/dL (ref 0.61–1.24)
GFR calc Af Amer: >60 mL/min (ref >60)
GFR calc non Af Amer: >60 mL/min (ref >60)

## 2019-02-12 MED ORDER — AMOXICILLIN-POT CLAVULANATE 875-125 MG PO TABS: 1.0000 | ORAL_TABLET | Freq: Two times a day (BID) | ORAL | 0 refills | Status: AC

## 2019-02-12 MED ORDER — TOBRAMYCIN 300 MG/5ML IN NEBU: 300.0000 mg | INHALATION_SOLUTION | Freq: Two times a day (BID) | RESPIRATORY_TRACT | 5 refills | Status: DC

## 2019-02-12 NOTE — Discharge Summary (Signed)
[facility] NAME: [name]    MR#:  [id number]  DATE OF BIRTH:  12-Jan-1946  DATE OF ADMISSION:  02/09/2019 ADMITTING PHYSICIAN: Harrie Foreman, MD  DATE OF DISCHARGE: 02/12/2019 12:50 PM  PRIMARY CARE PHYSICIAN: Asencion Noble, MD    ADMISSION DIAGNOSIS:  COPD exacerbation ([place]) [J44.1] Community acquired pneumonia of left lung, unspecified part of lung [J18.9] Acute on chronic congestive heart failure, unspecified heart failure type ([place]) [I50.9] HCAP (healthcare-associated pneumonia) [J18.9]  DISCHARGE DIAGNOSIS:  Active Problems:   CAP (community acquired pneumonia)   HCAP (healthcare-associated pneumonia)   SECONDARY DIAGNOSIS:   Past Medical History:  Diagnosis Date  . Acute MI [facility])    pt denies  . Arthritis of lumbar spine   . Asthma   . Clotting disorder ([place])   . Collapsed lung    x3  . COPD (chronic obstructive pulmonary disease) ([place])   . Dyspnea   . History of benign thyroid tumor   . Lung cancer ([place])   . Mycobacterium avium complex ([place]) 06/24/2016  . Pneumonia   . Pneumothorax   . Small cell carcinoma of lung ([place])   . Spontaneous pneumothorax     HOSPITAL COURSE:   73 year old male with past medical history of COPD, history of lung cancer, asthma, hyperlipidemia who presented to the hospital due to shortness of breath.  1.  Acute respiratory failure with hypoxia-secondary to underlying COPD with now MAC pneumonia. -Initially patient was admitted to the hospital as he could not get his inhaled tobramycin at home.  Patient was initiated on inhaled tobramycin along with empiric meropenem. -Patient has clinically improved, is not hypoxic was ambulated and feels better and therefore is being discharged home to continue his inhaled tobramycin and a few more days of oral Augmentin.  2.  Pneumonia- due to Mycobacterium avium complex. -Patient was admitted as he was not able to afford to get his inhaled  tobramycin, he received those while in the hospital.  As per the patient he has not received the medication at home and is therefore being discharged home will continue his inhaled tobramycin.  He will continue aggressive pulmonary toileting with chest PT, bronchodilator therapy and will continue follow-up with his pulmonologist Dr. Alva Garnet as an outpatient. -He was also treated empirically in the hospital with IV meropenem and therefore is being discharged on oral Augmentin for a few more days to finish the course.  3.  Hypokalemia- Improved and resolved with supplementation.   4.  Leukocytosis- improved and resolved with abx. Therapy and can be further followed as outpatient.   5. Hyperlipidemia - pt. Will cont. Atorvastatin  DISCHARGE CONDITIONS:   Stable.   CONSULTS OBTAINED:  Treatment Team:  Wilhelmina Mcardle, MD  DRUG ALLERGIES:   Allergies  Allergen Reactions  . Other Other (See Comments)    Hydromet cough syrup-causes GI upset  . Codeine Nausea And Vomiting    DISCHARGE MEDICATIONS:   Allergies as of 02/12/2019      Reactions   Other Other (See Comments)   Hydromet cough syrup-causes GI upset   Codeine Nausea And Vomiting      Medication List    TAKE these medications   acetaminophen 325 MG tablet Commonly known as:  TYLENOL Take 650 mg by mouth as needed.   albuterol 108 (90 Base) MCG/ACT inhaler Commonly known as:  VENTOLIN HFA Inhale 2 puffs into the lungs every 6 (six) hours as needed for wheezing  or shortness of breath.   albuterol (2.5 MG/3ML) 0.083% nebulizer solution Commonly known as:  PROVENTIL Take 3 mLs (2.5 mg total) by nebulization 2 (two) times daily. With chest percussion treatment. May also use every 6 hrs as needed for increased shortness of breath, wheezing, chest tightness   amoxicillin-clavulanate 875-125 MG tablet Commonly known as:  AUGMENTIN Take 1 tablet by mouth 2 (two) times daily for 4 days.   aspirin 81 MG EC tablet Take 81  mg by mouth daily. Swallow whole.   atorvastatin 10 MG tablet Commonly known as:  LIPITOR TAKE 1 TABLET BY MOUTH EVERY DAY   furosemide 20 MG tablet Commonly known as:  LASIX Take 20 mg by mouth daily.   guaiFENesin 600 MG 12 hr tablet Commonly known as:  MUCINEX Take 600 mg by mouth daily.   Klor-Con M20 20 MEQ tablet Generic drug:  potassium chloride SA Take 20 mEq by mouth daily.   moxifloxacin 0.5 % ophthalmic solution Commonly known as:  VIGAMOX Place 4 drops into the left eye daily. 4 gtts until Monday and then Dr was going to lower it to 2 gtts.   prednisoLONE acetate 1 % ophthalmic suspension Commonly known as:  PRED FORTE Place 1 drop into the left eye 4 (four) times daily.   tobramycin (PF) 300 MG/5ML nebulizer solution Commonly known as:  TOBI Take 5 mLs (300 mg total) by nebulization every 12 (twelve) hours. What changed:  additional instructions   traMADol 50 MG tablet Commonly known as:  ULTRAM Take 1 tablet (50 mg total) by mouth every 6 (six) hours as needed. Takes 2 tablets 4 times daily as needed for pain.         DISCHARGE INSTRUCTIONS:   DIET:  Cardiac diet  DISCHARGE CONDITION:  Stable  ACTIVITY:  Activity as tolerated  OXYGEN:  Home Oxygen: No.   Oxygen Delivery: room air  DISCHARGE LOCATION:  Home with Home Health PT   If you experience worsening of your admission symptoms, develop shortness of breath, life threatening emergency, suicidal or homicidal thoughts you must seek medical attention immediately by calling 911 or calling your MD immediately  if symptoms less severe.  You Must read complete instructions/literature along with all the possible adverse reactions/side effects for all the Medicines you take and that have been prescribed to you. Take any new Medicines after you have completely understood and accpet all the possible adverse reactions/side effects.   Please note  You were cared for by a hospitalist during your  hospital stay. If you have any questions about your discharge medications or the care you received while you were in the hospital after you are discharged, you can call the unit and asked to speak with the hospitalist on call if the hospitalist that took care of you is not available. Once you are discharged, your primary care physician will handle any further medical issues. Please note that NO REFILLS for any discharge medications will be authorized once you are discharged, as it is imperative that you return to your primary care physician (or establish a relationship with a primary care physician if you do not have one) for your aftercare needs so that they can reassess your need for medications and monitor your lab values.     Today   Patient sitting up in chair in no acute distress.  No acute events overnight.  Shortness of breath is improved.  Still has a dry cough.  Afebrile and otherwise stable.  Will discharge  home today.  VITAL SIGNS:  Blood pressure 129/70, pulse 89, temperature 98.1 F (36.7 C), temperature source Oral, resp. rate 20, height _0  (1.702 m), weight 66 kg, SpO2 97 %.  I/O:    Intake/Output Summary (Last 24 hours) at 02/12/2019 1558 Last data filed at 02/12/2019 0950 Gross per 24 hour  Intake 480 ml  Output 75 ml  Net 405 ml    PHYSICAL EXAMINATION:  GENERAL:  73 y.o.-year-old patient sitting up in chair in no acute distress.  EYES: Pupils equal, round, reactive to light and accommodation. No scleral icterus. Extraocular muscles intact.  HEENT: Head atraumatic, normocephalic. Oropharynx and nasopharynx clear.  NECK:  Supple, no jugular venous distention. No thyroid enlargement, no tenderness.  LUNGS: Normal breath sounds bilaterally, no wheezing, rales,rhonchi. No use of accessory muscles of respiration.  CARDIOVASCULAR: S1, S2 normal. No murmurs, rubs, or gallops.  ABDOMEN: Soft, non-tender, non-distended. Bowel sounds present. No organomegaly or mass.   EXTREMITIES: + 2 edema b/l with signs of chronic venous stasis b/l, No cyanosis, or clubbing.  NEUROLOGIC: Cranial nerves II through XII are intact. No focal motor or sensory defecits b/l.  PSYCHIATRIC: The patient is alert and oriented x 3. SKIN: No obvious rash, lesion, or ulcer.   DATA REVIEW:   CBC Recent Labs  Lab 02/12/19 0527  WBC 16.7*  HGB 9.4*  HCT 29.4*  PLT 523*    Chemistries  Recent Labs  Lab 02/09/19 0214 02/11/19 1039 02/12/19 0527  NA 131* 133*  --   K 3.5 3.0*  --   CL 95* 93*  --   CO2 24 29  --   GLUCOSE 129* 142*  --   BUN 20 33*  --   CREATININE 1.16 1.15 0.96  CALCIUM 8.2* 8.5*  --   MG  --  1.9  --   AST 24  --   --   ALT 21  --   --   ALKPHOS 68  --   --   BILITOT 0.8  --   --     Cardiac Enzymes Recent Labs  Lab 02/09/19 0214  TROPONINI <0.03    Microbiology Results  Results for orders placed or performed during the hospital encounter of 02/09/19  SARS Coronavirus 2 (CEPHEID- Performed in [place] hospital lab), Hosp Order     Status: None   Collection Time: 02/09/19  2:18 AM  Result Value Ref Range Status   SARS Coronavirus 2 NEGATIVE NEGATIVE Final    Comment: (NOTE) If result is NEGATIVE SARS-CoV-2 target nucleic acids are NOT DETECTED. The SARS-CoV-2 RNA is generally detectable in upper and lower  respiratory specimens during the acute phase of infection. The lowest  concentration of SARS-CoV-2 viral copies this assay can detect is 250  copies / mL. A negative result does not preclude SARS-CoV-2 infection  and should not be used as the sole basis for treatment or other  patient management decisions.  A negative result may occur with  improper specimen collection / handling, submission of specimen other  than nasopharyngeal swab, presence of viral mutation(s) within the  areas targeted by this assay, and inadequate number of viral copies  (<250 copies / mL). A negative result must be combined with clinical   observations, patient history, and epidemiological information. If result is POSITIVE SARS-CoV-2 target nucleic acids are DETECTED. The SARS-CoV-2 RNA is generally detectable in upper and lower  respiratory specimens dur ing the acute phase of infection.  Positive  results are  indicative of active infection with SARS-CoV-2.  Clinical  correlation with patient history and other diagnostic information is  necessary to determine patient infection status.  Positive results do  not rule out bacterial infection or co-infection with other viruses. If result is PRESUMPTIVE POSTIVE SARS-CoV-2 nucleic acids MAY BE PRESENT.   A presumptive positive result was obtained on the submitted specimen  and confirmed on repeat testing.  While 2019 novel coronavirus  (SARS-CoV-2) nucleic acids may be present in the submitted sample  additional confirmatory testing may be necessary for epidemiological  and / or clinical management purposes  to differentiate between  SARS-CoV-2 and other Sarbecovirus currently known to infect humans.  If clinically indicated additional testing with an alternate test  methodology [phone]) is advised. The SARS-CoV-2 RNA is generally  detectable in upper and lower respiratory sp ecimens during the acute  phase of infection. The expected result is Negative. Fact Sheet for Patients:  StrictlyIdeas.no Fact Sheet for Healthcare Providers: BankingDealers.co.za This test is not yet approved or cleared by the [place] FDA and has been authorized for detection and/or diagnosis of SARS-CoV-2 by FDA under an Emergency Use Authorization (EUA).  This EUA will remain in effect (meaning this test can be used) for the duration of the COVID-19 declaration under Section 564(b)(1) of the Act, 21 U.S.C. section 360bbb-3(b)(1), unless the authorization is terminated or revoked sooner. Performed at [facility], [address] [place], [place] [address]   Culture, blood (Routine x 2)     Status: None (Preliminary result)   Collection Time: 02/09/19  2:30 AM  Result Value Ref Range Status   Specimen Description BLOOD RIGHT ANTECUBITAL  Final   Special Requests   Final    BOTTLES DRAWN AEROBIC AND ANAEROBIC Blood Culture results may not be optimal due to an excessive volume of blood received in culture bottles   Culture   Final    NO GROWTH 3 DAYS Performed at [facility], [address] [place], [place] [address]    Report Status PENDING  Incomplete  Culture, blood (Routine x 2)     Status: None (Preliminary result)   Collection Time: 02/09/19  2:39 AM  Result Value Ref Range Status   Specimen Description BLOOD BLOOD RIGHT HAND  Final   Special Requests   Final    BOTTLES DRAWN AEROBIC AND ANAEROBIC Blood Culture results may not be optimal due to an excessive volume of blood received in culture bottles   Culture   Final    NO GROWTH 3 DAYS Performed at [facility], [address], [place], [place] [address]    Report Status PENDING  Incomplete    RADIOLOGY:  Dg Chest Port 1 View  Result Date: 02/11/2019 CLINICAL DATA:  Shortness of breath, cough, pneumonia. EXAM: PORTABLE CHEST 1 VIEW COMPARISON:  Radiographs 2 days prior, 02/09/2019 FINDINGS: Development of patchy opacity in the periphery of the right lung base. Chronic volume loss and postsurgical change in the left hemithorax with diffusely increased density throughout the aerated left lung. Possible cavitary component at the left lung base which was seen on 2017 chest CT and chronic. Underlying right lung hyperinflation again seen with multiple chain sutures. Unchanged mediastinal contours, heart size is obscured by postsurgical change. No pulmonary edema or pneumothorax. IMPRESSION: 1. Development of patchy opacity in the periphery of the right lung base, atelectasis versus pneumonia. 2. Chronic volume loss and postsurgical change in  the left hemithorax. Electronically Signed   By:  Keith Rake M.D.   On: 02/11/2019 03:11      Management plans discussed with the patient, family and they are in agreement.  CODE STATUS:     Code Status Orders  (From admission, onward)         Start     Ordered   02/09/19 0503  Full code  Continuous     02/09/19 0502         TOTAL TIME TAKING CARE OF THIS PATIENT: 40 minutes.    Henreitta Leber M.D on 02/12/2019 at 3:58 PM  Between 7am to 6pm - Pager - [phone]  After 6pm go to [email or web address] - Proofreader  [facility] [place]  Office  [phone]  CC: Primary care physician; Asencion Noble, MD

## 2019-02-12 NOTE — Telephone Encounter (Signed)
Looked at hospital notes Appears he has PNA with MAC Could not afford outpt ABX provided by pulmonary Hospital best option as not a major cardiac issue

## 2019-02-12 NOTE — TOC Transition Note (Signed)
Transition of Care [facility]) - CM/SW Discharge Note   Patient Details  Name: [name] MRN: [id number] Date of Birth: 23-May-1946  Transition of Care [facility]) CM/SW Contact:  Latanya Maudlin, RN Phone Number: 02/12/2019, 11:20 AM   Clinical Narrative:  Patient to be discharged per MD order. Orders in place for home health services. Patient was previously set up with home health through [facility] since that is who they have used in the past. No DME needs. Family to transport.       Final next level of care: [place] Barriers to Discharge: No Barriers Identified   Patient Goals and CMS Choice Patient states their goals for this hospitalization and ongoing recovery are:: to ge tthe fluid in my legs down CMS Medicare.gov Compare Post Acute Care list provided to:: Patient Choice offered to / list presented to : Patient  Discharge Placement                       Discharge Plan and Services   Discharge Planning Services: CM Consult Post Acute Care Choice: Durable Medical Equipment                    HH Arranged: PT [place]: [facility] Date [place]: 02/12/19 Time [place]: 1120 Representative spoke with at [place]: [place] ([place]) Interventions     Readmission Risk Interventions Readmission Risk Prevention Plan 02/12/2019 02/11/2019 02/10/2019  Transportation Screening Complete Complete Complete  PCP or Specialist Appt within 3-5 Days Complete - -  Home Care Screening - - Complete  Medication Review (RN CM) - - Complete  HRI or Home Care Consult Complete Complete -  Social Work Consult for [place] Planning/Counseling Patient refused - -  Palliative Care Screening Patient Refused Patient Refused -  Medication Review Press photographer) Complete - -  Some recent data might be hidden

## 2019-02-12 NOTE — Consult Note (Signed)
Pharmacy Antibiotic Note  [name] is a 73 y.o. male admitted on 02/09/2019 with Gram Negative Superinfection, treating PNA with MAC.   Pharmacy has been consulted for Meropenem dosing also on tobramycin nebs BID.   Plan: Continue Meropenem 1g q 8h  Height: _0  (170.2 cm) Weight: 145 lb 8.1 oz (66 kg) IBW/kg (Calculated) : 66.1  Temp (24hrs), Avg:98.2 F (36.8 C), Min:98.1 F (36.7 C), Max:98.4 F (36.9 C)  Recent Labs  Lab 02/09/19 0214 02/11/19 1039 02/12/19 0527  WBC 13.0* 15.2* 16.7*  CREATININE 1.16 1.15 0.96  LATICACIDVEN 1.6  --   --     Estimated Creatinine Clearance: 64.9 mL/min (by C-G formula based on SCr of 0.96 mg/dL).    Allergies  Allergen Reactions  . Other Other (See Comments)    Hydromet cough syrup-causes GI upset  . Codeine Nausea And Vomiting    Antimicrobials this admission: 5/14 Azithromycin x 1 5/14 Ceftriaxone x 1 5/14 Meropenem >> 5/15 Inhaled tobramycin >>  Dose adjustments this admission: None  Microbiology results: 5/14 BCx: NG x 1 day  Thank you for allowing pharmacy to be a part of this patient's care.  Oswald Hillock, PharmD, BCPS Clinical Pharmacist 02/12/2019 11:49 AM]\

## 2019-02-12 NOTE — Progress Notes (Signed)
Pt is being discharge home with HH. Discharge papers given and explained to pt.  Pt verbalized understanding. Meds and f/u appointments reviewed. Rx given. Awaiting transportation.

## 2019-02-13 ENCOUNTER — Other Ambulatory Visit: Payer: Self-pay

## 2019-02-13 DIAGNOSIS — J441 Chronic obstructive pulmonary disease with (acute) exacerbation: Secondary | ICD-10-CM

## 2019-02-14 ENCOUNTER — Encounter: Payer: Self-pay | Admitting: *Deleted

## 2019-02-14 ENCOUNTER — Telehealth: Payer: Self-pay | Admitting: Pulmonary Disease

## 2019-02-14 ENCOUNTER — Other Ambulatory Visit: Payer: Self-pay | Admitting: *Deleted

## 2019-02-14 LAB — CULTURE, BLOOD (ROUTINE X 2)
Culture: NO GROWTH
Culture: NO GROWTH

## 2019-02-14 NOTE — Telephone Encounter (Signed)
Spoke to wife, the patient is having difficulty tolerating Augmentin 875mg , having severe diarrhea and nausea. Tobi seems to be working well. She is wanting to know if he can stop Augmentin since he only has 2 days left. He is being good about drinking plenty of fluids to avoid dehydration. [name] cell [phone].  DS please advise.

## 2019-02-14 NOTE — Telephone Encounter (Signed)
Prescription signed and faxed to [phone] with confirmation.

## 2019-02-14 NOTE — Patient Outreach (Signed)
[facility]) [place] Telephone Outreach PCP office completes Transition of Care follow up post-hospital discharge Post-hospital discharge day # 2  02/14/2019  KALYB PEMBLE 04/04/1946 [id number]  Successful telephone outreach to Charlton Amor, wife (on [place]) of patient Rolando Hessling, 73 y/o male referred to [place] after recent hospitalization May 14-17, 2020 for CAP/ shortness of breath/ COPD exacerbation.  Patient has history including, but not limited to, COPD; adenocarcinoma of (L) lung; HTN/ HLD; and NSTEMI.  HIPAA/ identity verified with patient's spouse and [place] services were discussed with her.  Today, patient's spouse states that she and patient are not interested in receiving [place] services, stating that patient is doing fine, and she is currently expecting a call from his pulmonologist and is not available to talk today, as she is waiting on this return call.  I offered to place another call later this week, however, she stated that another call was not necessary; wife verbalizes frustration that this referral was placed without she nor patient's knowledge at time of hospitalization and stated that "if they wanted [place] to participate, they should have let us know while we were at the hospital and they placed the referral."  Wife agrees to have me put letter in mail describing [facility] CM services and verifies current address.  Wife politely declines [facility] CM services and states that she will review letter once she receives in mail and if they are interested at that time, she will contact me.  Plan:  Will place [facility] CM unsuccessful outreach letter in mail to patient, as wife requested today.  Oneta Rack, RN, BSN, [employer] [facility] Care Management  [phone]

## 2019-02-15 NOTE — Telephone Encounter (Signed)
OK to stop Augmentin  Thanks  [name]

## 2019-02-15 NOTE — Telephone Encounter (Signed)
Spoke to patient's wife, she is aware he can stop the Augmentin. Nothing further at this time.

## 2019-02-24 ENCOUNTER — Telehealth: Payer: Self-pay | Admitting: Pulmonary Disease

## 2019-02-24 NOTE — Telephone Encounter (Signed)
Called patient for COVID-19 pre-screening for in office visit.  Have you recently traveled any where out of the local area in the last 2 weeks? no  Have you been in close contact with a person diagnosed with COVID-19 within the last 2 weeks? No   Do you currently have any of the following symptoms? If so, when did they start? Cough-baseline    Diarrhea-last week with abx(phone note)   Joint Pain Fever      Muscle Pain   Red eyes Shortness of breath-baseline   Abdominal pain Vomiting-last week- subsided after stopping abx. Loss of smell    Rash    Sore Throat Headache    Weakness   Bruising or bleeding   Okay to proceed with visit. (date)

## 2019-02-27 ENCOUNTER — Other Ambulatory Visit: Payer: Self-pay

## 2019-02-27 ENCOUNTER — Ambulatory Visit (INDEPENDENT_AMBULATORY_CARE_PROVIDER_SITE_OTHER): Payer: Medicare Other | Admitting: Pulmonary Disease

## 2019-02-27 ENCOUNTER — Encounter: Payer: Self-pay | Admitting: Pulmonary Disease

## 2019-02-27 DIAGNOSIS — J471 Bronchiectasis with (acute) exacerbation: Secondary | ICD-10-CM | POA: Diagnosis not present

## 2019-02-27 DIAGNOSIS — I2510 Atherosclerotic heart disease of native coronary artery without angina pectoris: Secondary | ICD-10-CM | POA: Diagnosis not present

## 2019-02-27 DIAGNOSIS — R42 Dizziness and giddiness: Secondary | ICD-10-CM

## 2019-02-27 DIAGNOSIS — A310 Pulmonary mycobacterial infection: Secondary | ICD-10-CM

## 2019-02-27 DIAGNOSIS — F419 Anxiety disorder, unspecified: Secondary | ICD-10-CM | POA: Diagnosis not present

## 2019-02-27 DIAGNOSIS — Z7189 Other specified counseling: Secondary | ICD-10-CM

## 2019-02-27 MED ORDER — ALPRAZOLAM 0.25 MG PO TABS: 0.1250 mg | ORAL_TABLET | Freq: Two times a day (BID) | ORAL | 5 refills | Status: AC | PRN

## 2019-02-27 NOTE — Patient Instructions (Addendum)
Change nebulized tobramycin (TOBI) to 300 mg nebulized once a day at bedtime Initiate physical therapy evaluation Referral to palliative care services Alprazolam (Xanax) 0.125 (1/2 tab) to 0.25 (1 tab) twice daily as needed fro anxiety.  Prescription entered  Follow-up in 2-3 months.  Call sooner if needed

## 2019-02-28 ENCOUNTER — Telehealth: Payer: Self-pay

## 2019-02-28 NOTE — Progress Notes (Signed)
PULMONARY OFFICE FOLLOW UP  PROBLEMS: 1) Severe COPD 2) Pulmonary MAC - he has been intolerant to attempts @ Rx 3) Bronchiectasis 4) H/O lung cancer - S/P LLL resection 2000 5) Former smoker - quit 1995  INTERVAL HISTORY: Last seen 04/27.  Hospitalized 5/14-5/17/2020 with diagnosis of COPD exacerbation and pneumonia.   SUBJ: This is a post-hospital follow-up.  His discharge summary has been reviewed by me.  In addition, I spoke with Dr. Verdell Carmine at the time of discharge.  Since discharge, nebulized tobramycin has been initiated.  The patient also was treated with antibiotics during the hospitalization.  He was discharged home on Augmentin but had difficulty tolerating this medication and did not complete the prescribed course.  During this encounter, I interacted with the patient in person and with the patient's wife over the phone.  They expressed concern that the tobramycin seems to be causing "dizziness" and unsteadiness on his feet.  Notably, he did suffer fall prior to hospitalization.  He did not suffer any traumatic injury at that time.  Since hospitalization, he has been profoundly weak and remains severely limited by shortness of breath.  He and his wife question whether nebulized amikacin might be better tolerated due to their concerns that the tobramycin is causing unsteadiness on his feet.  Presently, he has chronic cough with mucoid sputum production.  He denies hemoptysis.  He is chronically short of breath with little day-to-day or moment to moment variation.  He has no fevers, pleuritic chest pain.  His appetite is marginal.  He is generally weak in addition to being profoundly limited by shortness of breath.  His wife also expresses concern that anxiety might be a significant contributor to his shortness of breath.  We reviewed his medications and he assures me that he is using Advair consistently.  He is nebulizing albuterol twice a day while he uses the chest percussion vest.   He is nebulizing tobramycin twice a day.  He continues to use the chest percussion vest compliantly and continues to benefit from it.    OBJ: Vitals:   02/27/19 1040 02/27/19 1048  BP:  (!) 142/60  Pulse:  (!) 106  SpO2:  96%  Weight: 138 lb (62.6 kg)   Height: _0  (1.702 m)   RA  Gen: In WC, fatigued-appearing and chronically ill in no overt respiratory distress.  Somewhat disheveled HEENT: Hoarse voice quality, NCAT, sclerae white Neck: No JVD noted Lungs: Breath sounds are mildly-moderately diminished without adventitious sounds Cardiovascular: Regular, no M Abdomen: Soft, nontender, normal BS Ext: No C/C/E Neuro: Generally weak, no focal deficits noted Skin: Limited exam, no lesions noted    DATA: BMP Latest Ref Rng & Units 02/12/2019 02/11/2019 02/09/2019  Glucose 70 - 99 mg/dL - 142(H) 129(H)  BUN 8 - 23 mg/dL - 33(H) 20  Creatinine 0.61 - 1.24 mg/dL 0.96 1.15 1.16  Sodium 135 - 145 mmol/L - 133(L) 131(L)  Potassium 3.5 - 5.1 mmol/L - 3.0(L) 3.5  Chloride 98 - 111 mmol/L - 93(L) 95(L)  CO2 22 - 32 mmol/L - 29 24  Calcium 8.9 - 10.3 mg/dL - 8.5(L) 8.2(L)    CBC Latest Ref Rng & Units 02/12/2019 02/11/2019 02/09/2019  WBC 4.0 - 10.5 K/uL 16.7(H) 15.2(H) 13.0(H)  Hemoglobin 13.0 - 17.0 g/dL 9.4(L) 10.0(L) 9.8(L)  Hematocrit 39.0 - 52.0 % 29.4(L) 30.2(L) 29.9(L)  Platelets 150 - 400 K/uL 523(H) 514(H) 511(H)    CXR 5/16: His baseline CXR is chronically abnormal with significant chronic  changes on the L >R.  On his most recent film, there was a vague opacity in the R costophrenic angle  I have personally reviewed all chest radiographs including chest x-rays and CT scan unless otherwise indicated  IMPRESSION: 1) former smoker, severe COPD 2) S/P LLL resection for lung cancer 2000 3) Severe bronchiectasis due to pulmonary MAC.   Intolerant to attempts at treating MAC  Recurrent exacerbations of bronchiectasis  Bronchiectasis becoming increasingly refractory to  systemic antibiotic therapy 4) progressive weakness and unsteadiness.  He had his wife attribute this to nebulized tobramycin therapy.  I am less convinced that this is the cause and suspect that the more likely cause is progression of his severe underlying lung disease   PLAN: 1) continue Advair inhaler, 1 inhalation twice daily.  Rinse mouth after use 2) continue chest percussion vest 15-30 minutes twice daily 3) continue nebulized albuterol 2.5 mg with chest percussion vest treatments. He is also to use as needed up to every 6 hours for shortness of breath, wheezing, chest tightness, cough 4) I recommended that he change the nebulized tobramycin to once a day prior to bedtime and assess whether this benefits his unsteadiness. 5) they have been holding off on in-home physical therapy after his recent hospitalization.  I encouraged that they get this initiated 6) we had a long discussion regarding goals of care.  We reviewed the end-stage nature of his lung disease and the progressive course over the past year.  He acknowledged that after each exacerbation, he has difficulty recovering back to his prior status.  He and his wife have already discussed some of these matters.  He has executed a living will and healthcare power of attorney document.  He and I discussed interventions at the end of life such as life support and ACLS.  We both agree that, at this point in his life and illness, these interventions would not be appropriate.  I have made a referral to palliative care services.  My hope is they can progress these conversations and formalize these decisions legally (such as executing out of hospital DNR documentation).  Follow-up in 2-3 months.  Call sooner if needed  Merton Border, MD PCCM service Mobile [phone] Pager [phone] 02/28/2019 1:26 PM

## 2019-02-28 NOTE — Telephone Encounter (Signed)
Telphone call to wife to schedule time to discuss palliative care services with patient and wife.  Wife reports she nor patient have access to computer or smart phone.  Wife states patient is scheduled to have PT start services this week.  Wife in agreement with telephonic visit on 03/02/19 at 3:00 PM.

## 2019-03-01 DIAGNOSIS — I252 Old myocardial infarction: Secondary | ICD-10-CM | POA: Diagnosis not present

## 2019-03-01 DIAGNOSIS — Z85118 Personal history of other malignant neoplasm of bronchus and lung: Secondary | ICD-10-CM | POA: Diagnosis not present

## 2019-03-01 DIAGNOSIS — Z8701 Personal history of pneumonia (recurrent): Secondary | ICD-10-CM | POA: Diagnosis not present

## 2019-03-01 DIAGNOSIS — J441 Chronic obstructive pulmonary disease with (acute) exacerbation: Secondary | ICD-10-CM | POA: Diagnosis not present

## 2019-03-01 DIAGNOSIS — M47816 Spondylosis without myelopathy or radiculopathy, lumbar region: Secondary | ICD-10-CM | POA: Diagnosis not present

## 2019-03-01 DIAGNOSIS — I509 Heart failure, unspecified: Secondary | ICD-10-CM | POA: Diagnosis not present

## 2019-03-01 DIAGNOSIS — I110 Hypertensive heart disease with heart failure: Secondary | ICD-10-CM | POA: Diagnosis not present

## 2019-03-01 DIAGNOSIS — Z85850 Personal history of malignant neoplasm of thyroid: Secondary | ICD-10-CM | POA: Diagnosis not present

## 2019-03-02 ENCOUNTER — Other Ambulatory Visit: Payer: Medicare Other

## 2019-03-02 ENCOUNTER — Other Ambulatory Visit: Payer: Self-pay

## 2019-03-02 DIAGNOSIS — Z515 Encounter for palliative care: Secondary | ICD-10-CM

## 2019-03-02 NOTE — Progress Notes (Addendum)
COMMUNITY PALLIATIVE CARE SW NOTE  PATIENT NAME: [name] DOB: June 23, 1946 MRN: [id number]  PRIMARY CARE PROVIDER: Asencion Noble, MD  RESPONSIBLE PARTY:  Acct ID - Guarantor Home Phone Work Phone Relationship Acct Type  [id number] [name], [name]* [phone]  Self P/F     [address], [place], [place] [address]     PLAN OF CARE and INTERVENTIONS:             1. GOALS OF CARE/ ADVANCE CARE PLANNING:  Patient is a FULL CODE. Discussion to continue. Patient and wife request for SW to mail HCPOA/Living Will form to review.  2. SOCIAL/EMOTIONAL/SPIRITUAL ASSESSMENT/ INTERVENTIONS:  SW and RN completed TELEPHONIC visit with patient and [name] (patient's wife). [name] reports patient is doing "good". Denies pain, sore from frequent cough. Patient is wearing a respiratory vest twice a day. Using nebulizers twice a day. Patient had a fall on May 9th. [name] said patient is weaker and more off balance. Patient sleeps in his recliner, and does rest well. Patient has tried to increase fluid intake. 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 Patient has one daughter, she lives in [place] and they communicate frequently via phone.  3. PATIENT/CAREGIVER EDUCATION/ COPING:  Patient and [name] indicate positive coping and that they are doing well. SW to continue provided advance care planning education and support on goals of care. [name] said she takes a break and walks one hour each day and enjoys this time.  4. PERSONAL EMERGENCY PLAN:  Family will call 9-1-1 for emergencies. Due to COVID-19 crisis, patient is remaining at home. 5. COMMUNITY RESOURCES COORDINATION/ HEALTH CARE NAVIGATION:  Patient has virtual visit next week with cardiologist. Patient has started home health PT with [facility], they plan to visit twice a week for the next few weeks. 6. FINANCIAL/LEGAL CONCERNS/INTERVENTIONS:  No concerns.      SOCIAL HX:  Social History   Tobacco Use  . Smoking status: Former Smoker   Packs/day: 3.00    Years: 30.00    Pack years: 90.00    Types: Cigars    Last attempt to quit: 03/02/1992    Years since quitting: 27.0  . Smokeless tobacco: Never Used  . Tobacco comment: quit 22 yrs ago  Substance Use Topics  . Alcohol use: No    Alcohol/week: 0.0 standard drinks    CODE STATUS:   Code Status: Prior  ADVANCED DIRECTIVES: N MOST FORM COMPLETE: No. HOSPICE EDUCATION PROVIDED: None.  PPS: Patient ambulates with a walker.   Due to the COVID-19 crisis, this visit was done via telephone from my office and it was initiated and consent by this patient and/or family. This was a scheduled visit.   I spent 30 minutes with patient/family, from 3:00-3:45p providing education, support and consultation.     Margaretmary Lombard, LCSW

## 2019-03-02 NOTE — Progress Notes (Signed)
PATIENT NAME: [name] DOB: [birth year] MRN: [id number]  PRIMARY CARE PROVIDER: Asencion Noble, MD  RESPONSIBLE PARTY:  Acct ID - Guarantor Home Phone Work Phone Relationship Acct Type  [id number] LEVAUGHN, [phone]  Self P/F     [address], [place], [place] [address]    PLAN OF CARE and INTERVENTIONS:               1.  GOALS OF CARE/ ADVANCE CARE PLANNING:  Remain at home with wife for as long as wife can manage patient's care.                 2.  PATIENT/CAREGIVER EDUCATION:  Education on palliative care services, education on s/s of infection, reviewed meds.               3.  DISEASE STATUS: Patient is a 73 year old patient who resides at home with his wife [name]. Patient has COPD and had hospitalization 3 weeks ago.  Patient and wife agreeable to telephonic visit with SW and RN. Due to the COVID-19 crisis, this visit was done via telephonic from my office and it was initiated and consent by this patient and or family. Patient's wife [name] reports PT with [facility] will be seeing patient 2 times per week.  Patient denies having any pain at the present time.  Wife reports patient has respiratory vest that he uses 2 times per day.  Wife reports vest helps patient get mucous up.  Patient has a productive cough of clear sputum. Wife reports patient is sore from coughing.  Patient uses nebulizer and was taking tobramycin bid but MD decreased to daily on Monday.  Patient suffered a fall 02/04/19 and balance has been off since then.  Wife reports patient is able to ambulate with walker, wife reports patient is ambulating better since finishing antibiotics.  Wife reports patient gets short of breath on exertion.  Patient is not currently on O2 and O2 sats ron 92-98%.  Patient 's appetite is fair.  Wife reports patient sleeps in recliner and restls well at night.  Wife states patient has  o interest in obtaining hospital bed.  Patient having increased edema in lower extremities.  Wife  states patient is taking Lasix 20 mg daily.  Patient has telephonic visit with MD in the AM.  Wife to talk with MD about increasing patient's Lasix for a few days to reduce edema.  Patient and wife coping well and wife states she walks for 1 hour everyday.  Wife states patient is safe in the home and she is able to run errands and obtain what they need.  Palliative care services explained and patient and wife in agreement with Palliative Care services.  Wife given number to contact Palliative care members.  Patient and wife encouraged to call with questions or concerns.                      HISTORY OF PRESENT ILLNESS:    CODE STATUS: Full Code  ADVANCED DIRECTIVES:N MOST FORM: No PPS: 50%   PHYSICAL EXAM:   VITALS:Telephonic visit, no VS obtained  LUNGS: shortness of breath on exertion/ productive cough of clear sputum CARDIAC:  EXTREMITIES: 2-3 + pitting edema to LE's SKIN: denies having any open areas of skin breakdown  NEURO: Alert and oriented x 4       [name], RN

## 2019-03-03 DIAGNOSIS — I110 Hypertensive heart disease with heart failure: Secondary | ICD-10-CM | POA: Diagnosis not present

## 2019-03-03 DIAGNOSIS — J449 Chronic obstructive pulmonary disease, unspecified: Secondary | ICD-10-CM | POA: Diagnosis not present

## 2019-03-03 DIAGNOSIS — J441 Chronic obstructive pulmonary disease with (acute) exacerbation: Secondary | ICD-10-CM | POA: Diagnosis not present

## 2019-03-03 DIAGNOSIS — J479 Bronchiectasis, uncomplicated: Secondary | ICD-10-CM | POA: Diagnosis not present

## 2019-03-03 DIAGNOSIS — Z85118 Personal history of other malignant neoplasm of bronchus and lung: Secondary | ICD-10-CM | POA: Diagnosis not present

## 2019-03-03 DIAGNOSIS — I252 Old myocardial infarction: Secondary | ICD-10-CM | POA: Diagnosis not present

## 2019-03-03 DIAGNOSIS — I509 Heart failure, unspecified: Secondary | ICD-10-CM | POA: Diagnosis not present

## 2019-03-03 DIAGNOSIS — M47816 Spondylosis without myelopathy or radiculopathy, lumbar region: Secondary | ICD-10-CM | POA: Diagnosis not present

## 2019-03-07 ENCOUNTER — Telehealth: Payer: Self-pay | Admitting: Cardiovascular Disease

## 2019-03-07 DIAGNOSIS — I110 Hypertensive heart disease with heart failure: Secondary | ICD-10-CM | POA: Diagnosis not present

## 2019-03-07 DIAGNOSIS — M47816 Spondylosis without myelopathy or radiculopathy, lumbar region: Secondary | ICD-10-CM | POA: Diagnosis not present

## 2019-03-07 DIAGNOSIS — I252 Old myocardial infarction: Secondary | ICD-10-CM | POA: Diagnosis not present

## 2019-03-07 DIAGNOSIS — Z85118 Personal history of other malignant neoplasm of bronchus and lung: Secondary | ICD-10-CM | POA: Diagnosis not present

## 2019-03-07 DIAGNOSIS — J441 Chronic obstructive pulmonary disease with (acute) exacerbation: Secondary | ICD-10-CM | POA: Diagnosis not present

## 2019-03-07 DIAGNOSIS — I509 Heart failure, unspecified: Secondary | ICD-10-CM | POA: Diagnosis not present

## 2019-03-07 NOTE — Telephone Encounter (Signed)
Patient has upcoming virtual 6/16 please advise if :   - needed prior to next available echo appt on 7/1 or schould be rescheduled after    Please send response back to me HIGH PRIORITY so msg can be reviewed quickly   Thanks

## 2019-03-07 NOTE — Telephone Encounter (Signed)
He should keep his appointment with Dr. Rockey Situ on 6/16 as this is also hospital follow up.

## 2019-03-08 NOTE — Telephone Encounter (Signed)
Patient wife declined appt at this time states there is nothing to discuss and patient has a hard time with multiple appts.    She decided to cancel 6/16 appt despite recommendations and will have echo and labs on 6/25 then fu Gollan 6/30

## 2019-03-09 DIAGNOSIS — H16002 Unspecified corneal ulcer, left eye: Secondary | ICD-10-CM | POA: Diagnosis not present

## 2019-03-09 DIAGNOSIS — Z947 Corneal transplant status: Secondary | ICD-10-CM | POA: Diagnosis not present

## 2019-03-09 DIAGNOSIS — H1789 Other corneal scars and opacities: Secondary | ICD-10-CM | POA: Diagnosis not present

## 2019-03-09 DIAGNOSIS — Z961 Presence of intraocular lens: Secondary | ICD-10-CM | POA: Diagnosis not present

## 2019-03-14 ENCOUNTER — Telehealth: Payer: Medicare Other | Admitting: Cardiovascular Disease

## 2019-03-14 DIAGNOSIS — I509 Heart failure, unspecified: Secondary | ICD-10-CM | POA: Diagnosis not present

## 2019-03-14 DIAGNOSIS — M47816 Spondylosis without myelopathy or radiculopathy, lumbar region: Secondary | ICD-10-CM | POA: Diagnosis not present

## 2019-03-14 DIAGNOSIS — I110 Hypertensive heart disease with heart failure: Secondary | ICD-10-CM | POA: Diagnosis not present

## 2019-03-14 DIAGNOSIS — Z85118 Personal history of other malignant neoplasm of bronchus and lung: Secondary | ICD-10-CM | POA: Diagnosis not present

## 2019-03-14 DIAGNOSIS — J441 Chronic obstructive pulmonary disease with (acute) exacerbation: Secondary | ICD-10-CM | POA: Diagnosis not present

## 2019-03-14 DIAGNOSIS — I252 Old myocardial infarction: Secondary | ICD-10-CM | POA: Diagnosis not present

## 2019-03-16 ENCOUNTER — Telehealth: Payer: Self-pay

## 2019-03-16 DIAGNOSIS — I110 Hypertensive heart disease with heart failure: Secondary | ICD-10-CM | POA: Diagnosis not present

## 2019-03-16 DIAGNOSIS — I252 Old myocardial infarction: Secondary | ICD-10-CM | POA: Diagnosis not present

## 2019-03-16 DIAGNOSIS — M47816 Spondylosis without myelopathy or radiculopathy, lumbar region: Secondary | ICD-10-CM | POA: Diagnosis not present

## 2019-03-16 DIAGNOSIS — Z85118 Personal history of other malignant neoplasm of bronchus and lung: Secondary | ICD-10-CM | POA: Diagnosis not present

## 2019-03-16 DIAGNOSIS — I509 Heart failure, unspecified: Secondary | ICD-10-CM | POA: Diagnosis not present

## 2019-03-16 DIAGNOSIS — J441 Chronic obstructive pulmonary disease with (acute) exacerbation: Secondary | ICD-10-CM | POA: Diagnosis not present

## 2019-03-16 NOTE — Telephone Encounter (Signed)
Given his admission, these are not needed at this time. He had a CMET drawn while admitted. The upcoming echo will give [place] more information than the BNP. I recommend he keep the echo and in person visit with Dr. Rockey Situ.

## 2019-03-16 NOTE — Telephone Encounter (Signed)
Called patient.  Made patient and patients wife aware that Dr. Rockey Situ is now seeing patients in the office.  Made patient aware they are schedule for a virtual visit on 03/28/2019 which is an in office only day for Dr. Rockey Situ. They was agreeable to come in the office to be seen.  Patients wife wanted to know if patient could get the labs drawn that Christell Faith, PA wanted the day he gets his ECHO (03/23/2019) Made patient aware that I would reach out to [employer], [place] and see if they are still needed due to the recent hospital visit.

## 2019-03-21 DIAGNOSIS — M47816 Spondylosis without myelopathy or radiculopathy, lumbar region: Secondary | ICD-10-CM | POA: Diagnosis not present

## 2019-03-21 DIAGNOSIS — I509 Heart failure, unspecified: Secondary | ICD-10-CM | POA: Diagnosis not present

## 2019-03-21 DIAGNOSIS — Z85118 Personal history of other malignant neoplasm of bronchus and lung: Secondary | ICD-10-CM | POA: Diagnosis not present

## 2019-03-21 DIAGNOSIS — I110 Hypertensive heart disease with heart failure: Secondary | ICD-10-CM | POA: Diagnosis not present

## 2019-03-21 DIAGNOSIS — I252 Old myocardial infarction: Secondary | ICD-10-CM | POA: Diagnosis not present

## 2019-03-21 DIAGNOSIS — J441 Chronic obstructive pulmonary disease with (acute) exacerbation: Secondary | ICD-10-CM | POA: Diagnosis not present

## 2019-03-23 ENCOUNTER — Other Ambulatory Visit: Payer: Medicare Other

## 2019-03-23 DIAGNOSIS — R197 Diarrhea, unspecified: Secondary | ICD-10-CM | POA: Diagnosis not present

## 2019-03-24 DIAGNOSIS — Z85118 Personal history of other malignant neoplasm of bronchus and lung: Secondary | ICD-10-CM | POA: Diagnosis not present

## 2019-03-24 DIAGNOSIS — M47816 Spondylosis without myelopathy or radiculopathy, lumbar region: Secondary | ICD-10-CM | POA: Diagnosis not present

## 2019-03-24 DIAGNOSIS — I509 Heart failure, unspecified: Secondary | ICD-10-CM | POA: Diagnosis not present

## 2019-03-24 DIAGNOSIS — I252 Old myocardial infarction: Secondary | ICD-10-CM | POA: Diagnosis not present

## 2019-03-24 DIAGNOSIS — I110 Hypertensive heart disease with heart failure: Secondary | ICD-10-CM | POA: Diagnosis not present

## 2019-03-24 DIAGNOSIS — J441 Chronic obstructive pulmonary disease with (acute) exacerbation: Secondary | ICD-10-CM | POA: Diagnosis not present

## 2019-03-26 NOTE — Progress Notes (Signed)
Cardiology Office Note  Date:  03/28/2019   ID:  [name], [name] 01/03/1946, MRN [id number]  PCP:  Asencion Noble, MD   Chief Complaint  Patient presents with  . other    patient c/o SOB and swelling in ankles. Meds reviewed verbally with patient.     HPI:  [name] is a pleasant 73 year old gentleman with history of  lung cancer,  CT 05/19/2016 documenting a 7 cm cavity, bulla, left base Treated for mycobacteria, has not tolerated this well COPD  H/O lung cancer - S/P LLL resection 2000 non-ST elevation MI after choking on a vitamin pill,  admission to [facility] 05/09/2012 with peak troponin 1.5.  cardiac catheterization that showed nonocclusive coronary artery disease, several regions of 20% stenosis otherwise normal ejection fraction, normal echocardiogram.  He presents for routine followup of his coronary artery disease, shortness of breath  Recent hospitalization for pneumonia requiring IV antibiotics May 2020 Since discharge from the hospital is had severe leg swelling Not eating well Weight down 144 to 137 over the past year  Anemia, stable Albumin 2.7 in May 2020 Potassium 3.0 in 01/2019  Recent diarrhea x 1 week   Main complaint today is severe leg swelling which has been slowly getting better over the past month or so but still significant Sores on left leg, pitting edema bilaterally 2+ to above the mid shins He has been wearing compression hose with no improvement  Cough, shortness of breath  Still taking Lasix 20 daily with potassium per the wife on phone call today  Had corneal transplant, it is rejecting Performed at [facility] May need repeat procedure  Previously seen by Dr. Grayland Ormond for iron infusion  EKG personally reviewed by myself on todays visit Shows normal sinus rhythm with rate 86 bpm left axis deviation nonspecific T wave abnormality   hospital, Echo 07/20/2016   normal ejection fraction Mildly elevated right heart pressures   PMH:   has a past  medical history of Acute MI ([place]), Arthritis of lumbar spine, Asthma, Clotting disorder ([place]), Collapsed lung, COPD (chronic obstructive pulmonary disease) ([place]), Dyspnea, History of benign thyroid tumor, Lung cancer ([place]), Mycobacterium avium complex ([place]) (06/24/2016), Pneumonia, Pneumothorax, Small cell carcinoma of lung ([place]), and Spontaneous pneumothorax.  PSH:    Past Surgical History:  Procedure Laterality Date  . APPENDECTOMY    . benign thryoid nodule resected    . CARDIAC CATHETERIZATION  2013   [facility]  . CHEST TUBE INSERTION    . COLONOSCOPY N/A 08/30/2015   Procedure: COLONOSCOPY;  Surgeon: Rogene Houston, MD;  Location: [facility];  Service: Endoscopy;  Laterality: N/A;  1:45  . left upper lobectomy for small cell lung cancer    . VATS R thoracotomy      Current Outpatient Medications  Medication Sig Dispense Refill  . acetaminophen (TYLENOL) 325 MG tablet Take 650 mg by mouth as needed.      Marland Kitchen albuterol (PROVENTIL HFA;VENTOLIN HFA) 108 (90 Base) MCG/ACT inhaler Inhale 2 puffs into the lungs every 6 (six) hours as needed for wheezing or shortness of breath. 1 Inhaler 12  . albuterol (PROVENTIL) (2.5 MG/3ML) 0.083% nebulizer solution Take 3 mLs (2.5 mg total) by nebulization 2 (two) times daily. With chest percussion treatment. May also use every 6 hrs as needed for increased shortness of breath, wheezing, chest tightness 360 mL 3  . ALPRAZolam (XANAX) 0.25 MG tablet Take 0.5-1 tablets (0.125-0.25 mg total) by mouth 2 (two) times daily as needed for anxiety  or sleep. Use as needed for anxiety if all other measures fail.  Start with half tablet initially to see how you respond to this medication 30 tablet 5  . aspirin 81 MG EC tablet Take 81 mg by mouth daily. Swallow whole.    Marland Kitchen atorvastatin (LIPITOR) 10 MG tablet TAKE 1 TABLET BY MOUTH EVERY DAY 90 tablet 3  . Fluticasone-Salmeterol (ADVAIR DISKUS) 100-50 MCG/DOSE AEPB Inhale 1 puff into the lungs 2 (two) times daily.    .  furosemide (LASIX) 20 MG tablet Take 20 mg by mouth daily.     Marland Kitchen guaiFENesin (MUCINEX) 600 MG 12 hr tablet Take 600 mg by mouth daily.     Marland Kitchen KLOR-CON M20 20 MEQ tablet Take 20 mEq by mouth daily.    Marland Kitchen moxifloxacin (VIGAMOX) 0.5 % ophthalmic solution Place 4 drops into the left eye daily. 4 gtts until Monday and then Dr was going to lower it to 2 gtts.    . prednisoLONE acetate (PRED FORTE) 1 % ophthalmic suspension Place 1 drop into the left eye 4 (four) times daily.     Marland Kitchen tobramycin, PF, (TOBI) 300 MG/5ML nebulizer solution Take 5 mLs (300 mg total) by nebulization every 12 (twelve) hours. 300 mL 5  . traMADol (ULTRAM) 50 MG tablet Take 1 tablet (50 mg total) by mouth every 6 (six) hours as needed. Takes 2 tablets 4 times daily as needed for pain. 20 tablet 0   No current facility-administered medications for this visit.      Allergies:   Other and Codeine   Social History:  The patient  reports that he quit smoking about 27 years ago. His smoking use included cigars. He has a 90.00 pack-year smoking history. He has never used smokeless tobacco. He reports that he does not drink alcohol or use drugs.   Family History:   family history includes Colon cancer in his father; Heart disease in his mother; Heart failure in his brother.    Review of Systems: Review of Systems  Constitutional: Positive for weight loss.  Respiratory: Positive for cough.        Chest congestion  Cardiovascular: Negative.   Gastrointestinal: Negative.   Musculoskeletal: Negative.   Neurological: Negative.   Psychiatric/Behavioral: Negative.   All other systems reviewed and are negative.    PHYSICAL EXAM: VS:  BP (!) 112/50 (BP Location: Right Arm, Patient Position: Sitting, Cuff Size: Normal)   Pulse 86   Ht _0  (1.702 m)   Wt 137 lb (62.1 kg)   BMI 21.46 kg/m  , BMI Body mass index is 21.46 kg/m. Constitutional:  oriented to person, place, and time. No distress.  HENT:  Head: Grossly normal Eyes:   no discharge. No scleral icterus.  Neck: No JVD, no carotid bruits  Cardiovascular: Regular rate and rhythm, no murmurs appreciated 2+ pitting lower extremity edema to mid shins Pulmonary/Chest: Clear to auscultation bilaterally, no wheezes or rails Abdominal: Soft.  no distension.  no tenderness.  Musculoskeletal: Normal range of motion Neurological:  normal muscle tone. Coordination normal. No atrophy Skin: Skin warm and dry Psychiatric: normal affect, pleasant   Recent Labs: 02/09/2019: ALT 21; TSH 0.495 02/11/2019: BUN 33; Magnesium 1.9; Potassium 3.0; Sodium 133 02/12/2019: Creatinine, Ser 0.96; Hemoglobin 9.4; Platelets 523    Lipid Panel No results found for: CHOL, HDL, LDLCALC, TRIG    Wt Readings from Last 3 Encounters:  03/28/19 137 lb (62.1 kg)  03/02/19 139 lb (63 kg)  02/27/19 138 lb (  62.6 kg)     ASSESSMENT AND PLAN:  COPD exacerbation (HCC) Chronic secretions, on suppressive antibiotics nebulizers inhalers Recent hospital admission May 2020 requiring antibiotics  Mycobacterium avium complex ([place]) Followed by Dr. Ola Spurr  Dr. Alva Garnet,  chronic disease currently stable  Lower extremity edema Suspect pulmonary hypertension, diastolic CHF, Lots of fluids during recent hospitalization May 2020 We will increase Lasix up to 40 twice daily with potassium 20 twice daily for 3 days then down to Lasix 20 twice daily with potassium 20 twice daily CMP today  Coronary artery disease involving native heart without angina pectoris, unspecified vessel or lesion type - Plan: EKG 12-Lead Currently with no symptoms of angina. No further workup at this time. Continue current medication regimen.stable  Other secondary hypertension - Plan: EKG 12-Lead No changes to his medications  Mixed hyperlipidemia Currently on Lipitor  Other iron deficiency anemia - Plan: Ambulatory referral to Hematology / Oncology Seen by Dr. Grayland Ormond Chronic stable anemia as of May  2020  Corneal transplant Previous rejection    Total encounter time more than 25 minutes  Greater than 50% was spent in counseling and coordination of care with the patient   Disposition:   F/U 2 months   Orders Placed This Encounter  Procedures  . EKG 12-Lead     Signed, Esmond Plants, M.D., Ph.D. 03/28/2019  [place], [place]

## 2019-03-27 ENCOUNTER — Telehealth: Payer: Self-pay | Admitting: Cardiovascular Disease

## 2019-03-27 DIAGNOSIS — Z85118 Personal history of other malignant neoplasm of bronchus and lung: Secondary | ICD-10-CM | POA: Diagnosis not present

## 2019-03-27 DIAGNOSIS — J441 Chronic obstructive pulmonary disease with (acute) exacerbation: Secondary | ICD-10-CM | POA: Diagnosis not present

## 2019-03-27 DIAGNOSIS — I110 Hypertensive heart disease with heart failure: Secondary | ICD-10-CM | POA: Diagnosis not present

## 2019-03-27 DIAGNOSIS — I252 Old myocardial infarction: Secondary | ICD-10-CM | POA: Diagnosis not present

## 2019-03-27 DIAGNOSIS — I509 Heart failure, unspecified: Secondary | ICD-10-CM | POA: Diagnosis not present

## 2019-03-27 DIAGNOSIS — M47816 Spondylosis without myelopathy or radiculopathy, lumbar region: Secondary | ICD-10-CM | POA: Diagnosis not present

## 2019-03-27 NOTE — Telephone Encounter (Signed)

## 2019-03-28 ENCOUNTER — Other Ambulatory Visit: Payer: Self-pay

## 2019-03-28 ENCOUNTER — Ambulatory Visit (INDEPENDENT_AMBULATORY_CARE_PROVIDER_SITE_OTHER): Payer: Medicare Other | Admitting: Cardiovascular Disease

## 2019-03-28 ENCOUNTER — Encounter: Payer: Self-pay | Admitting: Cardiovascular Disease

## 2019-03-28 VITALS — BP 112/50 | HR 86 | Ht 67.0 in | Wt 137.0 lb

## 2019-03-28 DIAGNOSIS — I10 Essential (primary) hypertension: Secondary | ICD-10-CM

## 2019-03-28 DIAGNOSIS — I272 Pulmonary hypertension, unspecified: Secondary | ICD-10-CM | POA: Diagnosis not present

## 2019-03-28 DIAGNOSIS — J449 Chronic obstructive pulmonary disease, unspecified: Secondary | ICD-10-CM

## 2019-03-28 DIAGNOSIS — I2510 Atherosclerotic heart disease of native coronary artery without angina pectoris: Secondary | ICD-10-CM

## 2019-03-28 DIAGNOSIS — J470 Bronchiectasis with acute lower respiratory infection: Secondary | ICD-10-CM | POA: Diagnosis not present

## 2019-03-28 DIAGNOSIS — I5033 Acute on chronic diastolic (congestive) heart failure: Secondary | ICD-10-CM | POA: Diagnosis not present

## 2019-03-28 DIAGNOSIS — R0602 Shortness of breath: Secondary | ICD-10-CM

## 2019-03-28 DIAGNOSIS — A310 Pulmonary mycobacterial infection: Secondary | ICD-10-CM | POA: Diagnosis not present

## 2019-03-28 MED ORDER — FUROSEMIDE 20 MG PO TABS: 20.0000 mg | ORAL_TABLET | Freq: Two times a day (BID) | ORAL | 3 refills | Status: DC

## 2019-03-28 MED ORDER — KLOR-CON M20 20 MEQ PO TBCR: 20.0000 meq | EXTENDED_RELEASE_TABLET | Freq: Two times a day (BID) | ORAL | 3 refills | Status: DC

## 2019-03-28 NOTE — Patient Instructions (Addendum)
Please call the office in one week to let us know if swelling is improving  Boost/Ensure for nutrition daily?  Medication Instructions:  Your physician has recommended you make the following change in your medication:  1. INCREASE Furosemide 20 mg and take 2 tablets (40 mg) twice a day (AM and after lunch) for 3 days  2. AFTER 3 Days: DECREASE to Furosemide 20 mg twice a day 3. INCREASE Potassium (K-Dur) 20 mEq twice a day  If you need a refill on your cardiac medications before your next appointment, please call your pharmacy.    Lab work: CMP today   If you have labs (blood work) drawn today and your tests are completely normal, you will receive your results only by: Marland Kitchen MyChart Message (if you have MyChart) OR . A paper copy in the mail If you have any lab test that is abnormal or we need to change your treatment, we will call you to review the results.   Testing/Procedures: No new testing needed   Follow-Up: At [facility], you and your health needs are our priority.  As part of our continuing mission to provide you with exceptional heart care, we have created designated Provider Care Teams.  These Care Teams include your primary Cardiologist (physician) and Advanced Practice Providers (APPs -  Physician Assistants and Nurse Practitioners) who all work together to provide you with the care you need, when you need it.  . You will need a follow up appointment in 2 months  . Providers on your designated Care Team:   . Murray Hodgkins, NP . Christell Faith, PA-C . Marrianne Mood, PA-C  Any Other Special Instructions Will Be Listed Below (If Applicable).  For educational health videos Log in to : [email or web address] Or : [email or web address], password : [email or web address]

## 2019-03-29 ENCOUNTER — Telehealth: Payer: Self-pay

## 2019-03-29 DIAGNOSIS — M47816 Spondylosis without myelopathy or radiculopathy, lumbar region: Secondary | ICD-10-CM | POA: Diagnosis not present

## 2019-03-29 DIAGNOSIS — I509 Heart failure, unspecified: Secondary | ICD-10-CM | POA: Diagnosis not present

## 2019-03-29 DIAGNOSIS — I110 Hypertensive heart disease with heart failure: Secondary | ICD-10-CM | POA: Diagnosis not present

## 2019-03-29 DIAGNOSIS — I252 Old myocardial infarction: Secondary | ICD-10-CM | POA: Diagnosis not present

## 2019-03-29 DIAGNOSIS — J441 Chronic obstructive pulmonary disease with (acute) exacerbation: Secondary | ICD-10-CM | POA: Diagnosis not present

## 2019-03-29 DIAGNOSIS — Z85118 Personal history of other malignant neoplasm of bronchus and lung: Secondary | ICD-10-CM | POA: Diagnosis not present

## 2019-03-29 LAB — COMPREHENSIVE METABOLIC PANEL
ALT: 12 IU/L (ref 0–44)
AST: 13 IU/L (ref 0–40)
Albumin/Globulin Ratio: 1.1 — ABNORMAL LOW (ref 1.2–2.2)
Albumin: 3.5 g/dL — ABNORMAL LOW (ref 3.7–4.7)
Alkaline Phosphatase: 76 IU/L (ref 39–117)
BUN/Creatinine Ratio: 15 (ref 10–24)
BUN: 29 mg/dL — ABNORMAL HIGH (ref 8–27)
Bilirubin Total: 0.3 mg/dL (ref 0.0–1.2)
CO2: 19 mmol/L — ABNORMAL LOW (ref 20–29)
Calcium: 8.7 mg/dL (ref 8.6–10.2)
Chloride: 94 mmol/L — ABNORMAL LOW (ref 96–106)
Creatinine, Ser: 1.93 mg/dL — ABNORMAL HIGH (ref 0.76–1.27)
GFR calc Af Amer: 39 mL/min/{1.73_m2} — ABNORMAL LOW (ref >59)
GFR calc non Af Amer: 34 mL/min/{1.73_m2} — ABNORMAL LOW (ref >59)
Globulin, Total: 3.2 g/dL (ref 1.5–4.5)
Glucose: 114 mg/dL — ABNORMAL HIGH (ref 65–99)
Potassium: 3.1 mmol/L — ABNORMAL LOW (ref 3.5–5.2)
Sodium: 134 mmol/L (ref 134–144)
Total Protein: 6.7 g/dL (ref 6.0–8.5)

## 2019-03-29 NOTE — Telephone Encounter (Signed)
Telephone call to patient's home, spoke to patient's wife [name]. RN offered in home visit as SW and RN had not met with patient and wife face to face.  Wife requests telephonic visit this month.  Wife requests visit 03-31-19 at 8:30 AM.

## 2019-03-31 ENCOUNTER — Other Ambulatory Visit: Payer: Medicare Other

## 2019-03-31 ENCOUNTER — Other Ambulatory Visit: Payer: Self-pay

## 2019-03-31 DIAGNOSIS — M47816 Spondylosis without myelopathy or radiculopathy, lumbar region: Secondary | ICD-10-CM | POA: Diagnosis not present

## 2019-03-31 DIAGNOSIS — Z515 Encounter for palliative care: Secondary | ICD-10-CM

## 2019-03-31 DIAGNOSIS — Z85118 Personal history of other malignant neoplasm of bronchus and lung: Secondary | ICD-10-CM | POA: Diagnosis not present

## 2019-03-31 DIAGNOSIS — I509 Heart failure, unspecified: Secondary | ICD-10-CM | POA: Diagnosis not present

## 2019-03-31 DIAGNOSIS — Z85850 Personal history of malignant neoplasm of thyroid: Secondary | ICD-10-CM | POA: Diagnosis not present

## 2019-03-31 DIAGNOSIS — I252 Old myocardial infarction: Secondary | ICD-10-CM | POA: Diagnosis not present

## 2019-03-31 DIAGNOSIS — J441 Chronic obstructive pulmonary disease with (acute) exacerbation: Secondary | ICD-10-CM | POA: Diagnosis not present

## 2019-03-31 DIAGNOSIS — Z8701 Personal history of pneumonia (recurrent): Secondary | ICD-10-CM | POA: Diagnosis not present

## 2019-03-31 DIAGNOSIS — I110 Hypertensive heart disease with heart failure: Secondary | ICD-10-CM | POA: Diagnosis not present

## 2019-03-31 NOTE — Progress Notes (Signed)
PATIENT NAME: HARLEY FITZWATER DOB: 03/28/1946 MRN: [id number]  PRIMARY CARE PROVIDER: Asencion Noble, MD  RESPONSIBLE PARTY:  Acct ID - Guarantor Home Phone Work Phone Relationship Acct Type  [id number] BRYANN, MCNEALY* [phone]  Self P/F     [address], [place], [place] [address]    PLAN OF CARE and INTERVENTIONS:               1.  GOALS OF CARE/ ADVANCE CARE PLANNING: Patient wishes to remain at home with his wife [name] with symptoms managed.               2.  PATIENT/CAREGIVER EDUCATION:  Education on pursed lip breathing to control shortness of breath, education on fall precautions, education on eating yogurt to restore flora in the gut.               3.  DISEASE STATUS: Patient is a 73 year old patient with COPD, H/O lung cancer and heart disease.  Patient lives with his wife [name].   Due to the COVID-19 crisis, this visit was done via telephonic from my office and it was initiated and consent by this patient and or family. RN talked with patient for telephonic visit.  Patient saw cardiologist this week.  Patient was retaining fluid and patient's Lasix and Potassium was increased until Monday 04/03/19.  Wife reports patient's weight on 5/3 was 143 pounds and current weight is 131 pounds.  Patient alert and denies having any pain at the present time.  Patient is ambulating with walker.  Patient receiving PT and wife is hopeful patient will be able to walk with a cane after PT.  Patient is not on home O2 but has shortness of breath on exertion.  Wife reports patient is "doing better" and has not used rescued inhalers for 3 days.  Patient does nebulizer treatments bid.  Wife reports patient uses cough assist vest for 3o minutes bid.  Patient has a productive cough of clear sputum.  Patient's appetite is poor and wife states patient has been drinking Ensure.  Wife reports patient continues to have pitting edema in feet and ankles.  Patient has no open areas of skin breakdown.  Patient reports he has  been sleeping well and naps during the day.  Patient and wife deny having any needs or concerns at the present time. Patient and wife informed to call with questions or concerns.        HISTORY OF PRESENT ILLNESS:    CODE STATUS: Full Code  ADVANCED DIRECTIVES: N MOST FORM: No PPS: 50%   PHYSICAL EXAM:   VITALS: Vital signs taken by wife    LUNGS: Shortness of breath on exertion, productive cough of clear sputum CARDIAC:  EXTREMITIES: 1+ edema to LE's SKIN: Skin color, texture, turgor normal. No rashes or lesions  NEURO: positive for gait problems and weakness       [name], RN

## 2019-04-03 DIAGNOSIS — Z85118 Personal history of other malignant neoplasm of bronchus and lung: Secondary | ICD-10-CM | POA: Diagnosis not present

## 2019-04-03 DIAGNOSIS — I509 Heart failure, unspecified: Secondary | ICD-10-CM | POA: Diagnosis not present

## 2019-04-03 DIAGNOSIS — I110 Hypertensive heart disease with heart failure: Secondary | ICD-10-CM | POA: Diagnosis not present

## 2019-04-03 DIAGNOSIS — I252 Old myocardial infarction: Secondary | ICD-10-CM | POA: Diagnosis not present

## 2019-04-03 DIAGNOSIS — J441 Chronic obstructive pulmonary disease with (acute) exacerbation: Secondary | ICD-10-CM | POA: Diagnosis not present

## 2019-04-03 DIAGNOSIS — M47816 Spondylosis without myelopathy or radiculopathy, lumbar region: Secondary | ICD-10-CM | POA: Diagnosis not present

## 2019-04-05 ENCOUNTER — Telehealth: Payer: Self-pay | Admitting: Cardiovascular Disease

## 2019-04-05 DIAGNOSIS — I252 Old myocardial infarction: Secondary | ICD-10-CM | POA: Diagnosis not present

## 2019-04-05 DIAGNOSIS — I110 Hypertensive heart disease with heart failure: Secondary | ICD-10-CM | POA: Diagnosis not present

## 2019-04-05 DIAGNOSIS — J441 Chronic obstructive pulmonary disease with (acute) exacerbation: Secondary | ICD-10-CM | POA: Diagnosis not present

## 2019-04-05 DIAGNOSIS — I509 Heart failure, unspecified: Secondary | ICD-10-CM | POA: Diagnosis not present

## 2019-04-05 DIAGNOSIS — Z85118 Personal history of other malignant neoplasm of bronchus and lung: Secondary | ICD-10-CM | POA: Diagnosis not present

## 2019-04-05 DIAGNOSIS — M47816 Spondylosis without myelopathy or radiculopathy, lumbar region: Secondary | ICD-10-CM | POA: Diagnosis not present

## 2019-04-05 NOTE — Telephone Encounter (Signed)
Patient is calling stating that swelling has improved. Please call.

## 2019-04-06 NOTE — Telephone Encounter (Signed)
Wife reports swelling is much improved since OV. She states that Dr. Rockey Situ wanted him to call and follow up. She reports that he is down to taking 1 Lasix and 1 KCl per day.   Wt this am 133 lbs.  BP 113/58, HR 96, o2 95%.   Advised pt to call for any further questions or concerns.

## 2019-04-06 NOTE — Telephone Encounter (Signed)
Attempted to call patient. LMTCB 04/06/2019

## 2019-04-06 NOTE — Telephone Encounter (Signed)
Patient wife [name] returning call Best number to call is [phone]

## 2019-04-10 ENCOUNTER — Ambulatory Visit: Payer: Medicare Other

## 2019-04-10 ENCOUNTER — Ambulatory Visit: Payer: Medicare Other | Admitting: Oncology

## 2019-04-10 ENCOUNTER — Other Ambulatory Visit: Payer: Medicare Other

## 2019-04-10 ENCOUNTER — Telehealth: Payer: Self-pay | Admitting: *Deleted

## 2019-04-10 DIAGNOSIS — I5033 Acute on chronic diastolic (congestive) heart failure: Secondary | ICD-10-CM

## 2019-04-10 NOTE — Telephone Encounter (Signed)
Patient accidentally hung up phone and then called back and spoke with his wife per release form. Reviewed labs and medication changes. Requested that he please go tomorrow to have labs rechecked per provider request. She verbalized understanding and will have patient go to [facility] tomorrow to have that done. She was appreciative for the call back with no further questions at this time.

## 2019-04-10 NOTE — Telephone Encounter (Signed)
-----   Message from Minna Merritts, MD sent at 04/02/2019 10:09 AM EDT ----- On last clinic visit, labs drawn, at that time with 2+ leg edema Lab work has come back , renal function was abnormal Lasix increased up to 40 twice daily 3 days then down to 20 twice daily Also instructed potassium 20 twice a day Possible he is having cardiorenal issue and renal function may get better with diuresis but we will need to closely track his kidney function and electrolytes Would recommend repeat BMP as soon as possible this week Potassium was low, would make sure he is taking potassium

## 2019-04-11 ENCOUNTER — Other Ambulatory Visit
Admission: RE | Admit: 2019-04-11 | Discharge: 2019-04-11 | Disposition: A | Discharge status: Home / Self Care | Payer: Medicare Other | Source: Ambulatory Visit | Attending: Cardiovascular Disease | Admitting: Cardiovascular Disease

## 2019-04-11 ENCOUNTER — Telehealth: Payer: Self-pay | Admitting: Cardiovascular Disease

## 2019-04-11 DIAGNOSIS — E876 Hypokalemia: Secondary | ICD-10-CM

## 2019-04-11 DIAGNOSIS — J441 Chronic obstructive pulmonary disease with (acute) exacerbation: Secondary | ICD-10-CM | POA: Diagnosis not present

## 2019-04-11 DIAGNOSIS — A310 Pulmonary mycobacterial infection: Secondary | ICD-10-CM

## 2019-04-11 DIAGNOSIS — I509 Heart failure, unspecified: Secondary | ICD-10-CM | POA: Diagnosis not present

## 2019-04-11 DIAGNOSIS — I5033 Acute on chronic diastolic (congestive) heart failure: Secondary | ICD-10-CM | POA: Insufficient documentation

## 2019-04-11 DIAGNOSIS — M47816 Spondylosis without myelopathy or radiculopathy, lumbar region: Secondary | ICD-10-CM | POA: Diagnosis not present

## 2019-04-11 DIAGNOSIS — I252 Old myocardial infarction: Secondary | ICD-10-CM | POA: Diagnosis not present

## 2019-04-11 DIAGNOSIS — I110 Hypertensive heart disease with heart failure: Secondary | ICD-10-CM | POA: Diagnosis not present

## 2019-04-11 DIAGNOSIS — Z85118 Personal history of other malignant neoplasm of bronchus and lung: Secondary | ICD-10-CM | POA: Diagnosis not present

## 2019-04-11 LAB — COMPREHENSIVE METABOLIC PANEL
ALT: 24 U/L (ref 0–44)
AST: 23 U/L (ref 15–41)
Albumin: 3 g/dL — ABNORMAL LOW (ref 3.5–5.0)
Alkaline Phosphatase: 69 U/L (ref 38–126)
Anion gap: 12 (ref 5–15)
BUN: 31 mg/dL — ABNORMAL HIGH (ref 8–23)
CO2: 27 mmol/L (ref 22–32)
Calcium: 8.5 mg/dL — ABNORMAL LOW (ref 8.9–10.3)
Chloride: 90 mmol/L — ABNORMAL LOW (ref 98–111)
Creatinine, Ser: 1.7 mg/dL — ABNORMAL HIGH (ref 0.61–1.24)
GFR calc Af Amer: 46 mL/min — ABNORMAL LOW (ref >60)
GFR calc non Af Amer: 39 mL/min — ABNORMAL LOW (ref >60)
Glucose, Bld: 126 mg/dL — ABNORMAL HIGH (ref 70–99)
Potassium: 2.4 mmol/L — CL (ref 3.5–5.1)
Sodium: 129 mmol/L — ABNORMAL LOW (ref 135–145)
Total Bilirubin: 0.8 mg/dL (ref 0.3–1.2)
Total Protein: 7.2 g/dL (ref 6.5–8.1)

## 2019-04-11 LAB — BRAIN NATRIURETIC PEPTIDE: B Natriuretic Peptide: 102 pg/mL — ABNORMAL HIGH (ref 0.0–100.0)

## 2019-04-11 MED ORDER — KLOR-CON M20 20 MEQ PO TBCR: 20.0000 meq | EXTENDED_RELEASE_TABLET | Freq: Every day | ORAL | Status: DC

## 2019-04-11 MED ORDER — FUROSEMIDE 20 MG PO TABS: 20.0000 mg | ORAL_TABLET | Freq: Every day | ORAL | Status: DC

## 2019-04-11 NOTE — Telephone Encounter (Signed)
CRITICAL VALUE STICKER  CRITICAL VALUE:  RECEIVER (on-site recipient of call): Olin Hauser    DATE & TIME NOTIFIED: 04/11/2019 11:05  MESSENGER (representative from lab): Katie  MD NOTIFIED: Dr. Rockey Situ through Secure chat  TIME OF NOTIFICATION: 11:07  RESPONSE: PENDING

## 2019-04-11 NOTE — Telephone Encounter (Signed)
-----   Message from Rise Mu, PA-C sent at 04/11/2019 11:09 AM EDT ----- Potassium is significantly low at 2.4.  I do see that he was recently diuresed by primary cardiologist.  It was uncertain if the patient was taking his potassium.  Please have patient increase KCl to 40 mEq twice daily for 2 days followed by resumption of KCl 20 mEq twice daily thereafter.  Renal function remains elevated though is improved from prior.  Albumin is low which is likely contributing to his swelling.  Sodium is also downtrending.  If patient remains significantly symptomatic it may be best for him to proceed to the ED as there may be some component of cardiorenal syndrome.  Recheck BMP 04/14/2019.  Await BNP.

## 2019-04-11 NOTE — Telephone Encounter (Addendum)
Spoke with the pt wife and made her aware of the pt lab results and Christell Faith, PA recommendation. Pt will increase K+ to 71mEq bid for 2 days. Pt wife sts that the pt is taking Lasix 20mg  daily and Potassium 1mEq daily not bid. Adv her that the pt will need to have a repeat bmet on Fri 04/14/19.  Adv the pt that I will update Ryan on the pt current lasix and K+ dosage and call back with instructions on what to do after the 2 day increase of K+. Pt wife sts that the pt is also to have labs at [place] ordered by the pt pcp and that Dr.Fagan normally orders labs every 3 months to follow the pt kidney function and electrolytes. Adv her that I will contact the pt pcp office to inquire what labs are ordered so that we do not duplicate. lmtcb for Dr.Fagan's nurse.

## 2019-04-11 NOTE — Telephone Encounter (Signed)
Spoke with Jewel the nurse at the pt pcp Dr.Fagan's office. They were wanting the pt to have cmet @ [facility] this Friday. Since Cardiology is addressing the pt hypokalemia they will cancel that order and ask that we share the results of the pt recent lab results with Dr.Fagan. 7/14 cmet results faxed via Epic to Dr.Fagan.  Adv Jewel that we will share the results of the pt repeat 7/17 bmet as well. Jewel voiced appreciation.  Spoke with the pt wife. Adv her that I have talked with Thurmond Butts and the pt pcp office. Pt is to resume his normal dosage Lasix 20mg  daily and Potassium 20 mEq daily on day 3. Pt medication updated to reflect correct dosing. [facility]. Pt wife verbalized understanding.

## 2019-04-13 DIAGNOSIS — I252 Old myocardial infarction: Secondary | ICD-10-CM | POA: Diagnosis not present

## 2019-04-13 DIAGNOSIS — I509 Heart failure, unspecified: Secondary | ICD-10-CM | POA: Diagnosis not present

## 2019-04-13 DIAGNOSIS — Z85118 Personal history of other malignant neoplasm of bronchus and lung: Secondary | ICD-10-CM | POA: Diagnosis not present

## 2019-04-13 DIAGNOSIS — M47816 Spondylosis without myelopathy or radiculopathy, lumbar region: Secondary | ICD-10-CM | POA: Diagnosis not present

## 2019-04-13 DIAGNOSIS — J441 Chronic obstructive pulmonary disease with (acute) exacerbation: Secondary | ICD-10-CM | POA: Diagnosis not present

## 2019-04-13 DIAGNOSIS — I110 Hypertensive heart disease with heart failure: Secondary | ICD-10-CM | POA: Diagnosis not present

## 2019-04-14 ENCOUNTER — Telehealth: Payer: Self-pay | Admitting: Cardiovascular Disease

## 2019-04-14 ENCOUNTER — Other Ambulatory Visit: Payer: Self-pay

## 2019-04-14 ENCOUNTER — Other Ambulatory Visit
Admission: RE | Admit: 2019-04-14 | Discharge: 2019-04-14 | Disposition: A | Discharge status: Home / Self Care | Payer: Medicare Other | Source: Ambulatory Visit | Attending: Physician Assistant | Admitting: Physician Assistant

## 2019-04-14 DIAGNOSIS — E876 Hypokalemia: Secondary | ICD-10-CM | POA: Diagnosis not present

## 2019-04-14 LAB — BASIC METABOLIC PANEL
Anion gap: 12 (ref 5–15)
BUN: 26 mg/dL — ABNORMAL HIGH (ref 8–23)
CO2: 23 mmol/L (ref 22–32)
Calcium: 8.4 mg/dL — ABNORMAL LOW (ref 8.9–10.3)
Chloride: 95 mmol/L — ABNORMAL LOW (ref 98–111)
Creatinine, Ser: 1.51 mg/dL — ABNORMAL HIGH (ref 0.61–1.24)
GFR calc Af Amer: 53 mL/min — ABNORMAL LOW (ref >60)
GFR calc non Af Amer: 45 mL/min — ABNORMAL LOW (ref >60)
Glucose, Bld: 127 mg/dL — ABNORMAL HIGH (ref 70–99)
Potassium: 3 mmol/L — ABNORMAL LOW (ref 3.5–5.1)
Sodium: 130 mmol/L — ABNORMAL LOW (ref 135–145)

## 2019-04-14 NOTE — Telephone Encounter (Signed)
I spoke with the patient's wife regarding the patient's labs and Dr. Donivan Scull recommendations to :  1) increase potassium 20 meq- take 2 tablets (40 meq) by mouth BID x 2 days, then 2) take potassium 20 meq- 1.5 tablets (30 meq) by mouth once daily 3) limit free water & increase dietary sodium 4) repeat a BMP on Wednesday 7/22  Mrs. Perz voices understanding of the above and is agreeable. They will go to the [facility] on 7/22 for a repeat BMP.  BMP order placed.

## 2019-04-14 NOTE — Telephone Encounter (Signed)
Notes recorded by Emily Filbert, RN on 04/14/2019 at 6:33 PM EDT  Reviewed the patient's low potassium with Dr. Rockey Situ.  Orders received to increase potassium to 40 meq BID x 2 days, 30 meq once daily after that.  Limit free water intake and increase dietary sodium intake.   Will repeat a BMP on Wednesday 7/22.

## 2019-04-14 NOTE — Addendum Note (Signed)
Addended by: Santiago Bur on: 04/14/2019 10:04 AM   Modules accepted: Orders

## 2019-04-18 DIAGNOSIS — I252 Old myocardial infarction: Secondary | ICD-10-CM | POA: Diagnosis not present

## 2019-04-18 DIAGNOSIS — J441 Chronic obstructive pulmonary disease with (acute) exacerbation: Secondary | ICD-10-CM | POA: Diagnosis not present

## 2019-04-18 DIAGNOSIS — I509 Heart failure, unspecified: Secondary | ICD-10-CM | POA: Diagnosis not present

## 2019-04-18 DIAGNOSIS — M47816 Spondylosis without myelopathy or radiculopathy, lumbar region: Secondary | ICD-10-CM | POA: Diagnosis not present

## 2019-04-18 DIAGNOSIS — I110 Hypertensive heart disease with heart failure: Secondary | ICD-10-CM | POA: Diagnosis not present

## 2019-04-18 DIAGNOSIS — Z85118 Personal history of other malignant neoplasm of bronchus and lung: Secondary | ICD-10-CM | POA: Diagnosis not present

## 2019-04-19 ENCOUNTER — Other Ambulatory Visit
Admission: RE | Admit: 2019-04-19 | Discharge: 2019-04-19 | Disposition: A | Discharge status: Home / Self Care | Payer: Medicare Other | Attending: Cardiovascular Disease | Admitting: Cardiovascular Disease

## 2019-04-19 ENCOUNTER — Other Ambulatory Visit: Payer: Self-pay

## 2019-04-19 DIAGNOSIS — E876 Hypokalemia: Secondary | ICD-10-CM | POA: Insufficient documentation

## 2019-04-19 LAB — BASIC METABOLIC PANEL
Anion gap: 12 (ref 5–15)
BUN: 22 mg/dL (ref 8–23)
CO2: 24 mmol/L (ref 22–32)
Calcium: 8.4 mg/dL — ABNORMAL LOW (ref 8.9–10.3)
Chloride: 96 mmol/L — ABNORMAL LOW (ref 98–111)
Creatinine, Ser: 1.45 mg/dL — ABNORMAL HIGH (ref 0.61–1.24)
GFR calc Af Amer: 55 mL/min — ABNORMAL LOW (ref >60)
GFR calc non Af Amer: 48 mL/min — ABNORMAL LOW (ref >60)
Glucose, Bld: 125 mg/dL — ABNORMAL HIGH (ref 70–99)
Potassium: 3.4 mmol/L — ABNORMAL LOW (ref 3.5–5.1)
Sodium: 132 mmol/L — ABNORMAL LOW (ref 135–145)

## 2019-04-20 DIAGNOSIS — I252 Old myocardial infarction: Secondary | ICD-10-CM | POA: Diagnosis not present

## 2019-04-20 DIAGNOSIS — I509 Heart failure, unspecified: Secondary | ICD-10-CM | POA: Diagnosis not present

## 2019-04-20 DIAGNOSIS — I110 Hypertensive heart disease with heart failure: Secondary | ICD-10-CM | POA: Diagnosis not present

## 2019-04-20 DIAGNOSIS — Z85118 Personal history of other malignant neoplasm of bronchus and lung: Secondary | ICD-10-CM | POA: Diagnosis not present

## 2019-04-20 DIAGNOSIS — M47816 Spondylosis without myelopathy or radiculopathy, lumbar region: Secondary | ICD-10-CM | POA: Diagnosis not present

## 2019-04-20 DIAGNOSIS — J441 Chronic obstructive pulmonary disease with (acute) exacerbation: Secondary | ICD-10-CM | POA: Diagnosis not present

## 2019-04-21 ENCOUNTER — Telehealth: Payer: Self-pay | Admitting: Cardiovascular Disease

## 2019-04-21 ENCOUNTER — Telehealth: Payer: Self-pay

## 2019-04-21 DIAGNOSIS — I5032 Chronic diastolic (congestive) heart failure: Secondary | ICD-10-CM | POA: Diagnosis not present

## 2019-04-21 DIAGNOSIS — E440 Moderate protein-calorie malnutrition: Secondary | ICD-10-CM | POA: Diagnosis not present

## 2019-04-21 DIAGNOSIS — J209 Acute bronchitis, unspecified: Secondary | ICD-10-CM | POA: Diagnosis not present

## 2019-04-21 NOTE — Telephone Encounter (Signed)
Minna Merritts, MD  P Cv Div [place] Triage        Potassium is slowly creeping upwards  I think he is on 30 mEq daily  Would stay on that dose  Renal function continues to improve  Sodium low but stable    Reviewed results with wife, okay per DPR. Reviewed med dosages indicated by Dr. Rockey Situ.   She verbalized understanding and agreeable to POC.   Confirmed appt for next month.   Advised pt to call for any further questions or concerns.

## 2019-04-21 NOTE — Telephone Encounter (Signed)
Patients wife calling in to go over lab results of 7/22. Also, patients PCP Asencion Noble) would like the results sent to him as well.

## 2019-04-21 NOTE — Telephone Encounter (Signed)
Call to Dr. Beverlee Nims office to request most recent lab work. Spoke to receptionist who agreed to send them over. Fax number provided.

## 2019-04-26 DIAGNOSIS — Z85118 Personal history of other malignant neoplasm of bronchus and lung: Secondary | ICD-10-CM | POA: Diagnosis not present

## 2019-04-26 DIAGNOSIS — M47816 Spondylosis without myelopathy or radiculopathy, lumbar region: Secondary | ICD-10-CM | POA: Diagnosis not present

## 2019-04-26 DIAGNOSIS — I252 Old myocardial infarction: Secondary | ICD-10-CM | POA: Diagnosis not present

## 2019-04-26 DIAGNOSIS — I110 Hypertensive heart disease with heart failure: Secondary | ICD-10-CM | POA: Diagnosis not present

## 2019-04-26 DIAGNOSIS — J441 Chronic obstructive pulmonary disease with (acute) exacerbation: Secondary | ICD-10-CM | POA: Diagnosis not present

## 2019-04-26 DIAGNOSIS — I509 Heart failure, unspecified: Secondary | ICD-10-CM | POA: Diagnosis not present

## 2019-04-28 ENCOUNTER — Telehealth: Payer: Self-pay

## 2019-04-28 DIAGNOSIS — J441 Chronic obstructive pulmonary disease with (acute) exacerbation: Secondary | ICD-10-CM | POA: Diagnosis not present

## 2019-04-28 DIAGNOSIS — M47816 Spondylosis without myelopathy or radiculopathy, lumbar region: Secondary | ICD-10-CM | POA: Diagnosis not present

## 2019-04-28 DIAGNOSIS — I110 Hypertensive heart disease with heart failure: Secondary | ICD-10-CM | POA: Diagnosis not present

## 2019-04-28 DIAGNOSIS — I509 Heart failure, unspecified: Secondary | ICD-10-CM | POA: Diagnosis not present

## 2019-04-28 DIAGNOSIS — I252 Old myocardial infarction: Secondary | ICD-10-CM | POA: Diagnosis not present

## 2019-04-28 DIAGNOSIS — Z85118 Personal history of other malignant neoplasm of bronchus and lung: Secondary | ICD-10-CM | POA: Diagnosis not present

## 2019-04-28 NOTE — Telephone Encounter (Signed)
Telephone call to patient's home, RN spoke with patient's wife [name].  [name] states patient is continuing to receive PT.  [name] states PT will be making home visit today.  [name] states PT will inform her of the days he will be visiting patient next week.  [name] requests RN call back later this afternoon after PT informs her of days he will be visiting so palliative care visit can be scheduled.

## 2019-04-30 DIAGNOSIS — E785 Hyperlipidemia, unspecified: Secondary | ICD-10-CM | POA: Diagnosis not present

## 2019-04-30 DIAGNOSIS — I252 Old myocardial infarction: Secondary | ICD-10-CM | POA: Diagnosis not present

## 2019-04-30 DIAGNOSIS — J441 Chronic obstructive pulmonary disease with (acute) exacerbation: Secondary | ICD-10-CM | POA: Diagnosis not present

## 2019-04-30 DIAGNOSIS — M47816 Spondylosis without myelopathy or radiculopathy, lumbar region: Secondary | ICD-10-CM | POA: Diagnosis not present

## 2019-04-30 DIAGNOSIS — I509 Heart failure, unspecified: Secondary | ICD-10-CM | POA: Diagnosis not present

## 2019-04-30 DIAGNOSIS — Z85850 Personal history of malignant neoplasm of thyroid: Secondary | ICD-10-CM | POA: Diagnosis not present

## 2019-04-30 DIAGNOSIS — Z87891 Personal history of nicotine dependence: Secondary | ICD-10-CM | POA: Diagnosis not present

## 2019-04-30 DIAGNOSIS — Z85118 Personal history of other malignant neoplasm of bronchus and lung: Secondary | ICD-10-CM | POA: Diagnosis not present

## 2019-04-30 DIAGNOSIS — I110 Hypertensive heart disease with heart failure: Secondary | ICD-10-CM | POA: Diagnosis not present

## 2019-05-01 ENCOUNTER — Emergency Department: Payer: Medicare Other

## 2019-05-01 ENCOUNTER — Other Ambulatory Visit: Payer: Self-pay

## 2019-05-01 ENCOUNTER — Inpatient Hospital Stay
Admission: EM | Admit: 2019-05-01 | Discharge: 2019-05-05 | DRG: 177 | Disposition: A | Discharge status: Hospice | Payer: Medicare Other | Attending: Internal Medicine | Admitting: Internal Medicine

## 2019-05-01 ENCOUNTER — Other Ambulatory Visit: Payer: Medicare Other

## 2019-05-01 ENCOUNTER — Telehealth: Payer: Self-pay

## 2019-05-01 DIAGNOSIS — L89152 Pressure ulcer of sacral region, stage 2: Secondary | ICD-10-CM | POA: Diagnosis present

## 2019-05-01 DIAGNOSIS — E876 Hypokalemia: Secondary | ICD-10-CM | POA: Diagnosis present

## 2019-05-01 DIAGNOSIS — Z7951 Long term (current) use of inhaled steroids: Secondary | ICD-10-CM | POA: Diagnosis not present

## 2019-05-01 DIAGNOSIS — Z20828 Contact with and (suspected) exposure to other viral communicable diseases: Secondary | ICD-10-CM | POA: Diagnosis present

## 2019-05-01 DIAGNOSIS — Z87891 Personal history of nicotine dependence: Secondary | ICD-10-CM | POA: Diagnosis not present

## 2019-05-01 DIAGNOSIS — M47816 Spondylosis without myelopathy or radiculopathy, lumbar region: Secondary | ICD-10-CM | POA: Diagnosis present

## 2019-05-01 DIAGNOSIS — Z8619 Personal history of other infectious and parasitic diseases: Secondary | ICD-10-CM | POA: Diagnosis not present

## 2019-05-01 DIAGNOSIS — A310 Pulmonary mycobacterial infection: Secondary | ICD-10-CM | POA: Diagnosis not present

## 2019-05-01 DIAGNOSIS — J449 Chronic obstructive pulmonary disease, unspecified: Secondary | ICD-10-CM | POA: Diagnosis not present

## 2019-05-01 DIAGNOSIS — E872 Acidosis: Secondary | ICD-10-CM | POA: Diagnosis not present

## 2019-05-01 DIAGNOSIS — I878 Other specified disorders of veins: Secondary | ICD-10-CM | POA: Diagnosis not present

## 2019-05-01 DIAGNOSIS — R0902 Hypoxemia: Secondary | ICD-10-CM | POA: Diagnosis not present

## 2019-05-01 DIAGNOSIS — J9620 Acute and chronic respiratory failure, unspecified whether with hypoxia or hypercapnia: Secondary | ICD-10-CM | POA: Diagnosis not present

## 2019-05-01 DIAGNOSIS — Z885 Allergy status to narcotic agent status: Secondary | ICD-10-CM | POA: Diagnosis not present

## 2019-05-01 DIAGNOSIS — R404 Transient alteration of awareness: Secondary | ICD-10-CM | POA: Diagnosis not present

## 2019-05-01 DIAGNOSIS — Z8249 Family history of ischemic heart disease and other diseases of the circulatory system: Secondary | ICD-10-CM | POA: Diagnosis not present

## 2019-05-01 DIAGNOSIS — Z66 Do not resuscitate: Secondary | ICD-10-CM | POA: Diagnosis present

## 2019-05-01 DIAGNOSIS — J9602 Acute respiratory failure with hypercapnia: Secondary | ICD-10-CM | POA: Diagnosis not present

## 2019-05-01 DIAGNOSIS — J9601 Acute respiratory failure with hypoxia: Secondary | ICD-10-CM | POA: Diagnosis present

## 2019-05-01 DIAGNOSIS — I252 Old myocardial infarction: Secondary | ICD-10-CM | POA: Diagnosis not present

## 2019-05-01 DIAGNOSIS — I5032 Chronic diastolic (congestive) heart failure: Secondary | ICD-10-CM | POA: Diagnosis present

## 2019-05-01 DIAGNOSIS — Z85118 Personal history of other malignant neoplasm of bronchus and lung: Secondary | ICD-10-CM | POA: Diagnosis not present

## 2019-05-01 DIAGNOSIS — J9600 Acute respiratory failure, unspecified whether with hypoxia or hypercapnia: Secondary | ICD-10-CM | POA: Diagnosis not present

## 2019-05-01 DIAGNOSIS — I130 Hypertensive heart and chronic kidney disease with heart failure and stage 1 through stage 4 chronic kidney disease, or unspecified chronic kidney disease: Secondary | ICD-10-CM | POA: Diagnosis present

## 2019-05-01 DIAGNOSIS — J189 Pneumonia, unspecified organism: Secondary | ICD-10-CM | POA: Diagnosis present

## 2019-05-01 DIAGNOSIS — Z902 Acquired absence of lung [part of]: Secondary | ICD-10-CM

## 2019-05-01 DIAGNOSIS — N183 Chronic kidney disease, stage 3 (moderate): Secondary | ICD-10-CM | POA: Diagnosis not present

## 2019-05-01 DIAGNOSIS — Z743 Need for continuous supervision: Secondary | ICD-10-CM | POA: Diagnosis not present

## 2019-05-01 DIAGNOSIS — J471 Bronchiectasis with (acute) exacerbation: Secondary | ICD-10-CM | POA: Diagnosis present

## 2019-05-01 DIAGNOSIS — R531 Weakness: Secondary | ICD-10-CM | POA: Diagnosis not present

## 2019-05-01 DIAGNOSIS — I959 Hypotension, unspecified: Secondary | ICD-10-CM | POA: Diagnosis not present

## 2019-05-01 DIAGNOSIS — R0602 Shortness of breath: Secondary | ICD-10-CM

## 2019-05-01 DIAGNOSIS — Z7982 Long term (current) use of aspirin: Secondary | ICD-10-CM | POA: Diagnosis not present

## 2019-05-01 DIAGNOSIS — Z9981 Dependence on supplemental oxygen: Secondary | ICD-10-CM | POA: Diagnosis not present

## 2019-05-01 DIAGNOSIS — I509 Heart failure, unspecified: Secondary | ICD-10-CM | POA: Diagnosis not present

## 2019-05-01 DIAGNOSIS — Z888 Allergy status to other drugs, medicaments and biological substances status: Secondary | ICD-10-CM | POA: Diagnosis not present

## 2019-05-01 DIAGNOSIS — J441 Chronic obstructive pulmonary disease with (acute) exacerbation: Secondary | ICD-10-CM | POA: Diagnosis not present

## 2019-05-01 DIAGNOSIS — L8990 Pressure ulcer of unspecified site, unspecified stage: Secondary | ICD-10-CM | POA: Insufficient documentation

## 2019-05-01 DIAGNOSIS — C3490 Malignant neoplasm of unspecified part of unspecified bronchus or lung: Secondary | ICD-10-CM | POA: Diagnosis not present

## 2019-05-01 DIAGNOSIS — J470 Bronchiectasis with acute lower respiratory infection: Secondary | ICD-10-CM | POA: Diagnosis present

## 2019-05-01 DIAGNOSIS — Z800 Family history of malignant neoplasm of digestive organs: Secondary | ICD-10-CM | POA: Diagnosis not present

## 2019-05-01 DIAGNOSIS — J479 Bronchiectasis, uncomplicated: Secondary | ICD-10-CM | POA: Diagnosis not present

## 2019-05-01 DIAGNOSIS — I5030 Unspecified diastolic (congestive) heart failure: Secondary | ICD-10-CM | POA: Diagnosis not present

## 2019-05-01 DIAGNOSIS — R05 Cough: Secondary | ICD-10-CM | POA: Diagnosis not present

## 2019-05-01 DIAGNOSIS — Z7189 Other specified counseling: Secondary | ICD-10-CM | POA: Diagnosis not present

## 2019-05-01 DIAGNOSIS — N189 Chronic kidney disease, unspecified: Secondary | ICD-10-CM | POA: Diagnosis not present

## 2019-05-01 DIAGNOSIS — Z515 Encounter for palliative care: Secondary | ICD-10-CM | POA: Diagnosis not present

## 2019-05-01 DIAGNOSIS — R279 Unspecified lack of coordination: Secondary | ICD-10-CM | POA: Diagnosis not present

## 2019-05-01 DIAGNOSIS — R0689 Other abnormalities of breathing: Secondary | ICD-10-CM | POA: Diagnosis not present

## 2019-05-01 DIAGNOSIS — J984 Other disorders of lung: Secondary | ICD-10-CM | POA: Diagnosis not present

## 2019-05-01 DIAGNOSIS — R001 Bradycardia, unspecified: Secondary | ICD-10-CM | POA: Diagnosis not present

## 2019-05-01 DIAGNOSIS — J9621 Acute and chronic respiratory failure with hypoxia: Secondary | ICD-10-CM | POA: Diagnosis present

## 2019-05-01 DIAGNOSIS — R634 Abnormal weight loss: Secondary | ICD-10-CM | POA: Diagnosis not present

## 2019-05-01 LAB — COMPREHENSIVE METABOLIC PANEL
ALT: 16 U/L (ref 0–44)
AST: 16 U/L (ref 15–41)
Albumin: 2.9 g/dL — ABNORMAL LOW (ref 3.5–5.0)
Alkaline Phosphatase: 70 U/L (ref 38–126)
Anion gap: 15 (ref 5–15)
BUN: 23 mg/dL (ref 8–23)
CO2: 26 mmol/L (ref 22–32)
Calcium: 8.7 mg/dL — ABNORMAL LOW (ref 8.9–10.3)
Chloride: 94 mmol/L — ABNORMAL LOW (ref 98–111)
Creatinine, Ser: 1.53 mg/dL — ABNORMAL HIGH (ref 0.61–1.24)
GFR calc Af Amer: 52 mL/min — ABNORMAL LOW (ref >60)
GFR calc non Af Amer: 45 mL/min — ABNORMAL LOW (ref >60)
Glucose, Bld: 111 mg/dL — ABNORMAL HIGH (ref 70–99)
Potassium: 3.1 mmol/L — ABNORMAL LOW (ref 3.5–5.1)
Sodium: 135 mmol/L (ref 135–145)
Total Bilirubin: 0.6 mg/dL (ref 0.3–1.2)
Total Protein: 7 g/dL (ref 6.5–8.1)

## 2019-05-01 LAB — CBC WITH DIFFERENTIAL/PLATELET
Abs Immature Granulocytes: 0.18 10*3/uL — ABNORMAL HIGH (ref 0.00–0.07)
Basophils Absolute: 0.1 10*3/uL (ref 0.0–0.1)
Basophils Relative: 1 %
Eosinophils Absolute: 0.8 10*3/uL — ABNORMAL HIGH (ref 0.0–0.5)
Eosinophils Relative: 4 %
HCT: 29.1 % — ABNORMAL LOW (ref 39.0–52.0)
Hemoglobin: 8.8 g/dL — ABNORMAL LOW (ref 13.0–17.0)
Immature Granulocytes: 1 %
Lymphocytes Relative: 5 %
Lymphs Abs: 0.9 10*3/uL (ref 0.7–4.0)
MCH: 26.5 pg (ref 26.0–34.0)
MCHC: 30.2 g/dL (ref 30.0–36.0)
MCV: 87.7 fL (ref 80.0–100.0)
Monocytes Absolute: 1.2 10*3/uL — ABNORMAL HIGH (ref 0.1–1.0)
Monocytes Relative: 7 %
Neutro Abs: 15.6 10*3/uL — ABNORMAL HIGH (ref 1.7–7.7)
Neutrophils Relative %: 82 %
Platelets: 533 10*3/uL — ABNORMAL HIGH (ref 150–400)
RBC: 3.32 MIL/uL — ABNORMAL LOW (ref 4.22–5.81)
RDW: 15.2 % (ref 11.5–15.5)
WBC: 18.9 10*3/uL — ABNORMAL HIGH (ref 4.0–10.5)
nRBC: 0 % (ref 0.0–0.2)

## 2019-05-01 LAB — PROCALCITONIN: Procalcitonin: 0.17 ng/mL

## 2019-05-01 LAB — TROPONIN I (HIGH SENSITIVITY)
Troponin I (High Sensitivity): 12 ng/L (ref <18)
Troponin I (High Sensitivity): 10 ng/L (ref <18)

## 2019-05-01 LAB — SARS CORONAVIRUS 2 BY RT PCR (HOSPITAL ORDER, PERFORMED IN ~~LOC~~ HOSPITAL LAB): SARS Coronavirus 2: NEGATIVE

## 2019-05-01 LAB — BRAIN NATRIURETIC PEPTIDE: B Natriuretic Peptide: 137 pg/mL — ABNORMAL HIGH (ref 0.0–100.0)

## 2019-05-01 MED ORDER — POTASSIUM CHLORIDE CRYS ER 20 MEQ PO TBCR
40.0000 meq | EXTENDED_RELEASE_TABLET | ORAL | Status: DC
  Administered 2019-05-01: 40 meq via ORAL
  Filled 2019-05-01: qty 2

## 2019-05-01 MED ORDER — FUROSEMIDE 10 MG/ML IJ SOLN
20.0000 mg | Freq: Two times a day (BID) | INTRAMUSCULAR | Status: DC
  Administered 2019-05-02 – 2019-05-05 (×6): 20 mg via INTRAVENOUS
  Filled 2019-05-01 (×7): qty 2

## 2019-05-01 MED ORDER — IPRATROPIUM-ALBUTEROL 0.5-2.5 (3) MG/3ML IN SOLN
3.0000 mL | RESPIRATORY_TRACT | Status: DC
  Administered 2019-05-01 – 2019-05-02 (×4): 3 mL via RESPIRATORY_TRACT
  Filled 2019-05-01 (×5): qty 3

## 2019-05-01 MED ORDER — SODIUM CHLORIDE 0.9 % IV SOLN
1.0000 g | INTRAVENOUS | Status: DC
  Filled 2019-05-01: qty 10

## 2019-05-01 MED ORDER — ACETAMINOPHEN 325 MG PO TABS
650.0000 mg | ORAL_TABLET | Freq: Four times a day (QID) | ORAL | Status: DC | PRN
  Administered 2019-05-04: 650 mg via ORAL
  Filled 2019-05-01: qty 2

## 2019-05-01 MED ORDER — ENOXAPARIN SODIUM 40 MG/0.4ML ~~LOC~~ SOLN
40.0000 mg | SUBCUTANEOUS | Status: DC
  Administered 2019-05-01 – 2019-05-03 (×2): 40 mg via SUBCUTANEOUS
  Filled 2019-05-01 (×2): qty 0.4

## 2019-05-01 MED ORDER — SODIUM CHLORIDE 0.9 % IV SOLN
1.0000 g | Freq: Once | INTRAVENOUS | Status: AC
  Administered 2019-05-01: 1 g via INTRAVENOUS
  Filled 2019-05-01: qty 10

## 2019-05-01 MED ORDER — ASPIRIN EC 81 MG PO TBEC
81.0000 mg | DELAYED_RELEASE_TABLET | Freq: Every day | ORAL | Status: DC
  Administered 2019-05-02 – 2019-05-05 (×4): 81 mg via ORAL
  Filled 2019-05-01 (×4): qty 1

## 2019-05-01 MED ORDER — ONDANSETRON HCL 4 MG PO TABS: 4.0000 mg | ORAL_TABLET | Freq: Four times a day (QID) | ORAL | Status: DC | PRN

## 2019-05-01 MED ORDER — TRAZODONE HCL 50 MG PO TABS
25.0000 mg | ORAL_TABLET | Freq: Every evening | ORAL | Status: DC | PRN
  Administered 2019-05-03: 25 mg via ORAL
  Filled 2019-05-01: qty 1

## 2019-05-01 MED ORDER — PREDNISOLONE ACETATE 1 % OP SUSP
1.0000 [drp] | Freq: Two times a day (BID) | OPHTHALMIC | Status: DC
  Administered 2019-05-01 – 2019-05-05 (×7): 1 [drp] via OPHTHALMIC
  Filled 2019-05-01 (×2): qty 1

## 2019-05-01 MED ORDER — ATORVASTATIN CALCIUM 20 MG PO TABS
10.0000 mg | ORAL_TABLET | Freq: Every day | ORAL | Status: DC
  Administered 2019-05-02 – 2019-05-05 (×4): 10 mg via ORAL
  Filled 2019-05-01 (×4): qty 1

## 2019-05-01 MED ORDER — SODIUM CHLORIDE 0.9 % IV SOLN
500.0000 mg | Freq: Once | INTRAVENOUS | Status: AC
  Administered 2019-05-01: 500 mg via INTRAVENOUS
  Filled 2019-05-01: qty 500

## 2019-05-01 MED ORDER — ONDANSETRON HCL 4 MG/2ML IJ SOLN: 4.0000 mg | Freq: Four times a day (QID) | INTRAMUSCULAR | Status: DC | PRN

## 2019-05-01 MED ORDER — AZITHROMYCIN 500 MG PO TABS
500.0000 mg | ORAL_TABLET | Freq: Every day | ORAL | Status: DC
  Filled 2019-05-01: qty 1

## 2019-05-01 MED ORDER — ACETAMINOPHEN 650 MG RE SUPP: 650.0000 mg | Freq: Four times a day (QID) | RECTAL | Status: DC | PRN

## 2019-05-01 MED ORDER — BISACODYL 5 MG PO TBEC: 5.0000 mg | DELAYED_RELEASE_TABLET | Freq: Every day | ORAL | Status: DC | PRN

## 2019-05-01 MED ORDER — ASPIRIN EC 81 MG PO TBEC: 81.0000 mg | DELAYED_RELEASE_TABLET | Freq: Every day | ORAL | Status: DC

## 2019-05-01 MED ORDER — IOHEXOL 350 MG/ML SOLN
60.0000 mL | Freq: Once | INTRAVENOUS | Status: AC | PRN
  Administered 2019-05-01: 16:00:00 60 mL via INTRAVENOUS

## 2019-05-01 MED ORDER — TOBRAMYCIN 300 MG/5ML IN NEBU
300.0000 mg | INHALATION_SOLUTION | Freq: Two times a day (BID) | RESPIRATORY_TRACT | Status: DC
  Filled 2019-05-01 (×3): qty 5

## 2019-05-01 MED ORDER — MOMETASONE FURO-FORMOTEROL FUM 100-5 MCG/ACT IN AERO
2.0000 | INHALATION_SPRAY | Freq: Two times a day (BID) | RESPIRATORY_TRACT | Status: DC
  Administered 2019-05-01 – 2019-05-05 (×8): 2 via RESPIRATORY_TRACT
  Filled 2019-05-01: qty 8.8

## 2019-05-01 MED ORDER — DOCUSATE SODIUM 100 MG PO CAPS
100.0000 mg | ORAL_CAPSULE | Freq: Two times a day (BID) | ORAL | Status: DC
  Administered 2019-05-01 – 2019-05-05 (×5): 100 mg via ORAL
  Filled 2019-05-01 (×6): qty 1

## 2019-05-01 MED ORDER — ALPRAZOLAM 0.25 MG PO TABS
0.1250 mg | ORAL_TABLET | Freq: Two times a day (BID) | ORAL | Status: DC | PRN
  Administered 2019-05-02 – 2019-05-05 (×4): 0.25 mg via ORAL
  Filled 2019-05-01 (×4): qty 1

## 2019-05-01 NOTE — ED Notes (Signed)
Pt states that he does not want anything to eat or drink.

## 2019-05-01 NOTE — ED Notes (Signed)
Patient transported to CT 

## 2019-05-01 NOTE — ED Triage Notes (Signed)
Pt from home via ems with reports that pt has been having increased sob and swelling for the past few days. Pt has hx of copd and chf. Pts RA sats when ems arrived was upper 80's. Pt denies pain.

## 2019-05-01 NOTE — Progress Notes (Signed)
Please note, patient was to be seen today by Donald Prose outpatient Palliative RN. Patient to the ED prior to this appointment. CMRN Marshell Garfinkel made aware. Flo Shanks BSN, RN, [facility] [employer] [phone]

## 2019-05-01 NOTE — ED Notes (Signed)
Pt clean and dry, VSS. Awaiting admission bed, wife updated. Offered food and drink, declines.

## 2019-05-01 NOTE — ED Notes (Signed)
Pt c/o generalized weakness progressing over past week, baseline SOB with small increase over past week and thick white productive cough.

## 2019-05-01 NOTE — Progress Notes (Signed)
PATIENT NAME: [name] DOB: [birth year] MRN: [id number]  PRIMARY CARE PROVIDER: Asencion Noble, MD  RESPONSIBLE PARTY:  Acct ID - Guarantor Home Phone Work Phone Relationship Acct Type  [id number] Steven, [name]  Self P/F     [address], [place],  [address]    PLAN OF CARE and INTERVENTIONS:               1.  GOALS OF CARE/ ADVANCE CARE PLANNING:  Patient would like to decrease hospitalizations, patient would like to get stronger and continue with PT services.                2.  PATIENT/CAREGIVER EDUCATION:  Education on s/s of infection, education on fall precautions.                3.  DISEASE STATUS: Patient is a 73 year old patient with COPD who lives at home with his wife [name].  RN called wife this AM to schedule visit.  Wife reports patient is not doing well and asks if nurse can make visit today.  Wife informs RN that patient's O2 sats are low and patient is unsteady on his feet. RN asked CoV19 screening questions and screening was negative.  Patient sitting in chair and shakes his head no when RN asks him if he is having any pain.  Wife reports patient is unable to walk without assistance and she is not sure she can continue to mange patient's care.  Patient lethargic and closes eyes while sitting in chair.  Patient has a productive cough of grayish sputum.  Wife states patient has declined the past 2 days.  Patient received nebulizer treatment this AM at 10:00 and patient has used rescue inhaler x 2 this AM.  Patient's O2 sats 79 - 81 %.  Patient has no fever but is short of breath with attempting to speak.  Nurse feels patient possibly could have pneumonia.  Wife informed patient needs to go to the ER due to shortness of breath and O2 sats being low.  Wife calls 911 and EMD arrives.  Patient being transported to [facility].  Patient remains a Full Code.  Wife reports patient has lost 10 pounds since June.  RN emailed [name] hospital liaison to inform her patient  being transported to the hospital.  Wife to call hospital in 1 hour to get update on patient's status as wife can not go into ER due to [place].  Support provided to wife.  Wife informed to call with questions or concerns.              HISTORY OF PRESENT ILLNESS:    CODE STATUS: Full Code  ADVANCED DIRECTIVES: N MOST FORM: No PPS: 30%   PHYSICAL EXAM:   VITALS:See vital signs  LUNGS: decreased breath sounds, expiratory wheezes bilaterally CARDIAC: Cor RRR  EXTREMITIES: 3+ and pitting edema SKIN: Skin color, texture, turgor normal. No rashes or lesions  NEURO: positive for speech problems and weakness       Steven Simmer, RN

## 2019-05-01 NOTE — ED Notes (Signed)
Pt urinated himself prior to collection, will attempt to collect urine in urinal next time he needs to urinate.

## 2019-05-01 NOTE — ED Provider Notes (Signed)
[facility] Emergency Department Provider Note  ____________________________________________   First MD Initiated Contact with Patient 05/01/19 1355     (approximate)  I have reviewed the triage vital signs and the nursing notes.   HISTORY  Chief Complaint Shortness of Breath and Congestive Heart Failure    HPI [name] is a 73 y.o. male with a history of COPD (not normally on home oxygen), hx MI, EF 65-70% on echo in 2017, hx lung cancer s/p LLL resection, treated for MAC in 2017, who presents for worsening shortness of breath and leg swelling since Thursday.  Patient states since Thursday he has had gradual worsening of shortness of breath and generalized fatigue.  Today family was concerned about his level of fatigue, therefore called EMS.  On EMS arrival, patient saturation was in the high 80s, placed on nasal cannula with improvement.  Patient denies any cough, fevers, or sick contacts.  He denies any chest pain, nausea, vomiting.  He reports worsening, symmetric lower extremity swelling for the last several weeks.  He is taking his medications as directed.  He reports continued urine response to Lasix.         Past Medical History:  Diagnosis Date  . Acute MI [facility])    pt denies  . Arthritis of lumbar spine   . Asthma   . Clotting disorder ([place])   . Collapsed lung    x3  . COPD (chronic obstructive pulmonary disease) ([place])   . Dyspnea   . History of benign thyroid tumor   . Lung cancer ([place])   . Mycobacterium avium complex ([place]) 06/24/2016  . Pneumonia   . Pneumothorax   . Small cell carcinoma of lung ([place])   . Spontaneous pneumothorax     Patient Active Problem List   Diagnosis Date Noted  . CAP (community acquired pneumonia) 02/09/2019  . HCAP (healthcare-associated pneumonia) 02/09/2019  . Atherosclerosis of native coronary artery of native heart with stable angina pectoris ([place]) 04/09/2018  . Iron deficiency anemia due to  chronic blood loss 09/15/2016  . Palliative care encounter   . DNR (do not resuscitate) discussion   . Protein-calorie malnutrition, severe 07/24/2016  . Respiratory failure ([place])   . Acute respiratory failure ([place]) 07/22/2016  . Iron deficiency anemia 07/21/2016  . General weakness   . Hyponatremia 07/20/2016  . COPD exacerbation ([place]) 06/28/2016  . Mycobacterium avium complex ([place]) 06/24/2016  . Bronchiectasis with acute lower respiratory infection ([place]) 05/20/2016  . Lung disease, bullous ([place]) 05/19/2016  . Peripheral edema 12/30/2014  . Encounter for therapeutic drug monitoring 03/15/2014  . Mediastinal adenopathy 01/26/2014  . Tick bite of axillary region 12/26/2013  . Pulmonary embolus ([place]) 12/22/2013  . PNA (pneumonia) 12/22/2013  . Hyperlipidemia 12/26/2012  . NSTEMI (non-ST elevated myocardial infarction) ([place]) 06/03/2012  . HTN (hypertension) 06/03/2012  . Adenocarcinoma of left lung ([place]) 04/30/2008  . DYSPNEA 11/18/2007  . COPD mixed type ([place]) 11/06/2007  . SPONTANEOUS PNEUMOTHORAX 10/31/2007    Past Surgical History:  Procedure Laterality Date  . APPENDECTOMY    . benign thryoid nodule resected    . CARDIAC CATHETERIZATION  2013   [facility]  . CHEST TUBE INSERTION    . COLONOSCOPY N/A 08/30/2015   Procedure: COLONOSCOPY;  Surgeon: Rogene Houston, MD;  Location: [facility];  Service: Endoscopy;  Laterality: N/A;  1:45  . left upper lobectomy for small cell lung cancer    . VATS R thoracotomy  Prior to Admission medications   Medication Sig Start Date End Date Taking? Authorizing Provider  acetaminophen (TYLENOL) 325 MG tablet Take 650 mg by mouth as needed.      [provider]  albuterol (PROVENTIL HFA;VENTOLIN HFA) 108 (90 Base) MCG/ACT inhaler Inhale 2 puffs into the lungs every 6 (six) hours as needed for wheezing or shortness of breath. 03/02/16   Baird Lyons D, MD  albuterol (PROVENTIL) (2.5 MG/3ML) 0.083% nebulizer solution Take 3 mLs  (2.5 mg total) by nebulization 2 (two) times daily. With chest percussion treatment. May also use every 6 hrs as needed for increased shortness of breath, wheezing, chest tightness 11/23/18   Wilhelmina Mcardle, MD  ALPRAZolam Duanne Moron) 0.25 MG tablet Take 0.5-1 tablets (0.125-0.25 mg total) by mouth 2 (two) times daily as needed for anxiety or sleep. Use as needed for anxiety if all other measures fail.  Start with half tablet initially to see how you respond to this medication 02/27/19 02/27/20  Wilhelmina Mcardle, MD  aspirin 81 MG EC tablet Take 81 mg by mouth daily. Swallow whole.    [provider]  atorvastatin (LIPITOR) 10 MG tablet TAKE 1 TABLET BY MOUTH EVERY DAY 06/17/15   Minna Merritts, MD  Fluticasone-Salmeterol (ADVAIR DISKUS) 100-50 MCG/DOSE AEPB Inhale 1 puff into the lungs 2 (two) times daily.    [provider]  furosemide (LASIX) 20 MG tablet Take 1 tablet (20 mg total) by mouth daily. 04/11/19   Dunn, Areta Haber, PA-C  guaiFENesin (MUCINEX) 600 MG 12 hr tablet Take 600 mg by mouth daily.     [provider]  KLOR-CON M20 20 MEQ tablet Take 1 tablet (20 mEq total) by mouth daily. 04/11/19   Dunn, Areta Haber, PA-C  moxifloxacin (VIGAMOX) 0.5 % ophthalmic solution Place 4 drops into the left eye daily. 4 gtts until Monday and then Dr was going to lower it to 2 gtts. 01/31/19   [provider]  prednisoLONE acetate (PRED FORTE) 1 % ophthalmic suspension Place 1 drop into the left eye 4 (four) times daily.  10/19/17   [provider]  tobramycin, PF, (TOBI) 300 MG/5ML nebulizer solution Take 5 mLs (300 mg total) by nebulization every 12 (twelve) hours. 02/12/19   Henreitta Leber, MD  traMADol (ULTRAM) 50 MG tablet Take 1 tablet (50 mg total) by mouth every 6 (six) hours as needed. Takes 2 tablets 4 times daily as needed for pain. 07/29/16   [name], MD    Allergies Other and Codeine  Family History  Problem Relation Age of Onset  . Heart disease Mother    . Colon cancer Father   . Heart failure Brother     Social History Social History   Tobacco Use  . Smoking status: Former Smoker    Packs/day: 3.00    Years: 30.00    Pack years: 90.00    Types: Cigars    Quit date: 03/02/1992    Years since quitting: 27.1  . Smokeless tobacco: Never Used  . Tobacco comment: quit 22 yrs ago  Substance Use Topics  . Alcohol use: No    Alcohol/week: 0.0 standard drinks  . Drug use: No    Review of Systems  Constitutional: Denies fever or chills. Eyes: Denies visual changes. ENT: Denies sore throat. Cardiovascular: Denies chest pain. Respiratory: + shortness of breath. Gastrointestinal: Denies abdominal pain, nausea, or vomiting. Genitourinary: Denies for dysuria. Musculoskeletal: + Leg swelling. Skin: Denies for rash. Neurological: Denies for  headaches.  ____________________________________________   PHYSICAL EXAM:  VITAL SIGNS: BP (!) 116/58 (BP Location: Right Arm)   Pulse 89   Temp 98.3 F (36.8 C) (Oral)   Resp (!) 22   Wt 59 kg   SpO2 94%   BMI 20.37 kg/m    Constitutional: Alert and oriented. Appears short of breath, speaking in shortened sentences.  Eyes: Conjunctivae are normal. Sclera anicteric. Head: Normocephalic. Atraumatic. Nose: No congestion/rhinnorhea. Mouth/Throat: Mucous membranes are moist.  Neck: No stridor.   Cardiovascular: Normal rate, regular rhythm. Grossly normal heart sounds.   Respiratory: Mildly increased work of breathing, able to speak in shortened sentences.  Wheezing bilaterally.  On 4 L nasal cannula. Gastrointestinal: Soft and nontender. No distention.  Musculoskeletal: Significant, pitting, symmetric bilateral lower extremity edema to the level of the knee. Neurologic:  Normal speech and language. No gross focal neurologic deficits are appreciated.  Skin: LE venous stasis changes.  Psychiatric: Mood and affect are appropriate for situation.   ____________________________________________  EKG  Wandering baseline.  No acute ischemic changes.  ____________________________________________   INITIAL IMPRESSION / ASSESSMENT AND PLAN / ED COURSE  73 year old male with a history of COPD (not normally on home oxygen), MI, EF 65-70% on echo in 2017, hx lung cancer s/p LLL resection, treated for MAC in 2017, who presents for worsening shortness of breath and leg swelling since Thursday.  Not normally on home oxygen, requiring 4 L nasal cannula (high 80s with EMS).  Wheezing bilaterally. Pitting edema bilaterally.   Differential includes COPD exacerbation, heart failure, infection, reoccurrence of prior cancer.  Will plan for labs, chest x-ray, reassess. Will cover empirically for COPD exacerbation with ceftriaxone and azithromycin.   XR at baseline is abnormal, concerning for interval worsening - perhaps infection vs recurrence of cancers. will proceed with CT for further deliniation.   Discussed with hospitalist for admission.  ____________________________________________   FINAL CLINICAL IMPRESSION(S) / ED DIAGNOSES  Final diagnoses:  Shortness of breath     Note:  This document was prepared using Dragon voice recognition software and may include unintentional dictation errors.   Lilia Pro., MD 05/01/19 [phone]

## 2019-05-01 NOTE — H&P (Addendum)
[facility] [place] NAME: [name]    MR#:  [id number]  DATE OF BIRTH:  1946/01/02  DATE OF ADMISSION:  05/01/2019  PRIMARY CARE PHYSICIAN: Asencion Noble, MD   REQUESTING/REFERRING PHYSICIAN: Dr. Gweneth Dimitri  CHIEF COMPLAINT:   Chief Complaint  Patient presents with  . Shortness of Breath  . Congestive Heart Failure    HISTORY OF PRESENT ILLNESS:  [name]  is a 73 y.o. male with a known history of left lung cancer, history of COPD, MAI, history of diastolic heart failure follows up with Dr. Rockey Situ comes in because of worsening shortness of breath, not able to take few steps without getting short of breath.  Patient not able to give complete history, spoke to patient's wife [name] over the phone, she mentioned that patient is getting more short of breath, oxygen saturations were in high 80s at home, patient followed by palliative care team.  Has been having a lot of headache and is because of low oxygen saturation he was brought in by EMS.  No fever, patient does have cough, leg edema.  Received IV Rocephin, Zithromax, emergency room.  On 4 L now.  Saturation 97%.  PAST MEDICAL HISTORY:   Past Medical History:  Diagnosis Date  . Acute MI [facility])    pt denies  . Arthritis of lumbar spine   . Asthma   . Clotting disorder ([place])   . Collapsed lung    x3  . COPD (chronic obstructive pulmonary disease) ([place])   . Dyspnea   . History of benign thyroid tumor   . Lung cancer ([place])   . Mycobacterium avium complex ([place]) 06/24/2016  . Pneumonia   . Pneumothorax   . Small cell carcinoma of lung ([place])   . Spontaneous pneumothorax     PAST SURGICAL HISTOIRY:   Past Surgical History:  Procedure Laterality Date  . APPENDECTOMY    . benign thryoid nodule resected    . CARDIAC CATHETERIZATION  2013   [facility]  . CHEST TUBE INSERTION    . COLONOSCOPY N/A 08/30/2015   Procedure: COLONOSCOPY;  Surgeon: Rogene Houston, MD;  Location: [facility]  ENDO SUITE;  Service: Endoscopy;  Laterality: N/A;  1:45  . left upper lobectomy for small cell lung cancer    . VATS R thoracotomy      SOCIAL HISTORY:   Social History   Tobacco Use  . Smoking status: Former [name]    Packs/day: 3.00    Years: 30.00    Pack years: 90.00    Types: Cigars    Quit date: 03/02/1992    Years since quitting: 27.1  . Smokeless tobacco: Never Used  . Tobacco comment: quit 22 yrs ago  Substance Use Topics  . Alcohol use: No    Alcohol/week: 0.0 standard drinks    FAMILY HISTORY:   Family History  Problem Relation Age of Onset  . Heart disease Mother   . Colon cancer Father   . Heart failure Brother     DRUG ALLERGIES:   Allergies  Allergen Reactions  . Other Other (See Comments)    Hydromet cough syrup-causes GI upset  . Codeine Nausea And Vomiting    REVIEW OF SYSTEMS:  CONSTITUTIONAL: Has been having fatigue, shortness of breath.  EYES: No blurred or double vision.  EARS, NOSE, AND THROAT: No tinnitus or ear pain.  RESPIRATORY: Cough, shortness of breath, weight loss.  No hemoptysis. CARDIOVASCULAR: No chest pain,  orthopnea, edema.  GASTROINTESTINAL: No nausea, vomiting, diarrhea or abdominal pain.  GENITOURINARY: No dysuria, hematuria.  ENDOCRINE: No polyuria, nocturia,  HEMATOLOGY: No anemia, easy bruising or bleeding SKIN: No rash or lesion. MUSCULOSKELETAL: No joint pain or arthritis.   NEUROLOGIC: No tingling, numbness, weakness.  PSYCHIATRY: No anxiety or depression.   MEDICATIONS AT HOME:   Prior to Admission medications   Medication Sig Start Date End Date Taking? Authorizing Provider  albuterol (PROVENTIL HFA;VENTOLIN HFA) 108 (90 Base) MCG/ACT inhaler Inhale 2 puffs into the lungs every 6 (six) hours as needed for wheezing or shortness of breath. 03/02/16  Yes Young, Tarri Fuller D, MD  albuterol (PROVENTIL) (2.5 MG/3ML) 0.083% nebulizer solution Take 3 mLs (2.5 mg total) by nebulization 2 (two) times daily. With chest  percussion treatment. May also use every 6 hrs as needed for increased shortness of breath, wheezing, chest tightness 11/23/18  Yes Wilhelmina Mcardle, MD  ALPRAZolam Duanne Moron) 0.25 MG tablet Take 0.5-1 tablets (0.125-0.25 mg total) by mouth 2 (two) times daily as needed for anxiety or sleep. Use as needed for anxiety if all other measures fail.  Start with half tablet initially to see how you respond to this medication 02/27/19 02/27/20 Yes Wilhelmina Mcardle, MD  aspirin 81 MG EC tablet Take 81 mg by mouth daily. Swallow whole.   Yes [provider]  atorvastatin (LIPITOR) 10 MG tablet TAKE 1 TABLET BY MOUTH EVERY DAY 06/17/15  Yes Gollan, Kathlene November, MD  Fluticasone-Salmeterol (ADVAIR DISKUS) 100-50 MCG/DOSE AEPB Inhale 1 puff into the lungs 2 (two) times daily.   Yes [provider]  furosemide (LASIX) 20 MG tablet Take 1 tablet (20 mg total) by mouth daily. 04/11/19  Yes Dunn, Areta Haber, PA-C  guaiFENesin (MUCINEX) 600 MG 12 hr tablet Take 600 mg by mouth daily.    Yes [provider]  KLOR-CON M20 20 MEQ tablet Take 1 tablet (20 mEq total) by mouth daily. Patient taking differently: Take 30 mEq by mouth daily.  04/11/19  Yes Dunn, Areta Haber, PA-C  prednisoLONE acetate (PRED FORTE) 1 % ophthalmic suspension Place 1 drop into the left eye 2 (two) times daily.  10/19/17  Yes [provider]  tobramycin, PF, (TOBI) 300 MG/5ML nebulizer solution Take 5 mLs (300 mg total) by nebulization every 12 (twelve) hours. Patient taking differently: Take 300 mg by nebulization daily.  02/12/19  Yes Sainani, Belia Heman, MD  traMADol (ULTRAM) 50 MG tablet Take 1 tablet (50 mg total) by mouth every 6 (six) hours as needed. Takes 2 tablets 4 times daily as needed for pain. 07/29/16  Yes Sudini, Alveta Heimlich, MD      VITAL SIGNS:  Blood pressure (!) 116/56, pulse 85, temperature 98.3 F (36.8 C), temperature source Oral, resp. rate 16, weight 59 kg, SpO2 97 %.  PHYSICAL EXAMINATION:  GENERAL:  73  y.o.-year-old patient lying in the bed, appears chronically ill. EYES: Pupils equal, round, reactive to light and accommodation. No scleral icterus. Extraocular muscles intact.  HEENT: Head atraumatic, normocephalic. Oropharynx and nasopharynx clear.  NECK:  Supple, no jugular venous distention. No thyroid enlargement, no tenderness.  LUNGS: Patient has bilateral wheezing. CARDIOVASCULAR: S1, S2 normal. No murmurs, rubs, or gallops.  ABDOMEN: Soft, nontender, nondistended. Bowel sounds present. No organomegaly or mass.  EXTREMITIES: 2+ pedal edema NEUROLOGIC: Cranial nerves II through XII are intact. Muscle strength 5/5 in all extremities. Sensation intact. Gait not checked.  PSYCHIATRIC: The patient is alert and oriented x 3.  SKIN: No  obvious rash, lesion, or ulcer.   LABORATORY PANEL:   CBC Recent Labs  Lab 05/01/19 1357  WBC 18.9*  HGB 8.8*  HCT 29.1*  PLT 533*   ------------------------------------------------------------------------------------------------------------------  Chemistries  Recent Labs  Lab 05/01/19 1357  NA 135  K 3.1*  CL 94*  CO2 26  GLUCOSE 111*  BUN 23  CREATININE 1.53*  CALCIUM 8.7*  AST 16  ALT 16  ALKPHOS 70  BILITOT 0.6   ------------------------------------------------------------------------------------------------------------------  Cardiac Enzymes No results for input(s): TROPONINI in the last 168 hours. ------------------------------------------------------------------------------------------------------------------  RADIOLOGY:  Ct Angio Chest Pe W/cm &/or Wo Cm  Result Date: 05/01/2019 CLINICAL DATA:  Shortness of breath, productive cough EXAM: CT ANGIOGRAPHY CHEST WITH CONTRAST TECHNIQUE: Multidetector CT imaging of the chest was performed using the standard protocol during bolus administration of intravenous contrast. Multiplanar CT image reconstructions and MIPs were obtained to evaluate the vascular anatomy. CONTRAST:  42m  OMNIPAQUE IOHEXOL 350 MG/ML SOLN COMPARISON:  Same day chest radiograph, CT chest, 06/28/2016 FINDINGS: Cardiovascular: Satisfactory opacification of the pulmonary arteries to the segmental level. No evidence of pulmonary embolism. Normal heart size. Scattered left coronary artery calcifications. No pericardial effusion. Aortic atherosclerosis. Mediastinum/Nodes: No enlarged mediastinal, hilar, or axillary lymph nodes. Thyroid gland, trachea, and esophagus demonstrate no significant findings. Lungs/Pleura: Redemonstrated postoperative findings of left upper lobectomy. There is extensive, nodular and cavitary airspace disease of the remaining left lower lobe, with a large, thick walled cavitary lesion about the inferior left pleural space containing a large air and fluid level. This is significantly enlarged compared to prior CT dated 06/28/2016 and generally in keeping with findings on recent radiographs. There is evidence of a prior right upper lobe wedge resection with new, dense consolidation of the paramedian right upper lobe (series 6, image 34). There is likewise very extensive nodular and cavitary airspace disease of the right lung, most conspicuous in the apical right upper lobe which is worsened in comparison CT dated 06/28/2016. Small left pleural effusion. Upper Abdomen: No acute abnormality. Musculoskeletal: No chest wall abnormality. No acute or significant osseous findings. Review of the MIP images confirms the above findings. IMPRESSION: 1.  Negative examination for pulmonary embolism. 2. Redemonstrated postoperative findings of left upper lobectomy. There is extensive, nodular and cavitary airspace disease of the remaining left lower lobe, with a large, thick walled cavitary lesion about the inferior left pleural space containing a large air and fluid level. This is significantly enlarged compared to prior CT dated 06/28/2016 and generally in keeping with findings on recent radiographs. There is  evidence of a prior right upper lobe wedge resection with new, dense consolidation of the paramedian right upper lobe (series 6, image 34). There is likewise very extensive nodular and cavitary airspace disease of the right lung, most conspicuous in the apical right upper lobe which is worsened in comparison CT dated 06/28/2016. Small left pleural effusion. 3. Overall constellation of findings is generally indicative of ongoing chronic infection possibly with underlying findings of metastatic pulmonary disease. Dense consolidation of the paramedian right upper lobe is the most concerning for malignancy, although could represent acute airspace disease, and could be further evaluated by PET-CT for abnormal FDG avidity. Interval CT or PET imaging dated after 06/28/2016 would be helpful for comparison if available. 4.  Coronary artery disease and aortic atherosclerosis. Electronically Signed   By: AEddie CandleM.D.   On: 05/01/2019 16:49   Dg Chest Port 1 View  Result Date: 05/01/2019 CLINICAL DATA:  Increasing shortness  of breath, lung cancer EXAM: PORTABLE CHEST 1 VIEW COMPARISON:  02/11/2019 FINDINGS: There has been an interval increase in dense, masslike opacity of the apical and paramedian right upper lobe. Redemonstrated extensive scarring and volume loss of the left lung with a large cavitary lesion at the left lung base. Diffuse, coarse interstitial opacity of the aerated portions of the right lung. The heart and mediastinal contours are largely obscured. IMPRESSION: There has been an interval increase in dense, masslike opacity of the apical and paramedian right upper lobe. Redemonstrated extensive scarring and volume loss of the left lung with a large cavitary lesion at the left lung base. Diffuse, coarse interstitial opacity of the aerated portions of the right lung. The heart and mediastinal contours are largely obscured. Consider CT to further evaluate for malignant recurrence or acutely superimposed  airspace disease. Electronically Signed   By: Eddie Candle M.D.   On: 05/01/2019 14:30    EKG:   Orders placed or performed during the hospital encounter of 05/01/19  . ED EKG  . ED EKG    IMPRESSION AND PLAN:  73 year old male patient with multiple medical problems of diastolic heart failure, essential hypertension, history of previous left lung cancer status post surgery, iron deficiency anemia gets iron shots with Dr. Grayland Ormond comes in because of worsening shortness of breath, fatigue. 1.  Acute respiratory failure with hypoxia, oxygen saturation 80% on room air when he came, now on 4 L of oxygen, patient acute respiratory failure likely secondary to COPD exacerbation and also right-sided pneumonia, unable to exclude malignancy on the right side which is new, so I consulted pulmonary physician, patient has seen Dr. Rosita Fire before so consult pulmonology.   #2 severe bronchiectasis secondary to MAC, intolerant attempt at treating MAC, recurrent exacerbations of bronchiectasis, refractory to systemic antibiotic therapy, patient is tobramycin inhalers, chest percussion therapy as per Dr. Rosita Fire note on June 1, patient was admitted in May for similar complaints.  However CT of the chest is concerning for malignancy on the right side and also worsening bronchiectasis. 3.  History of for left lung cancer status post resection in 2000, now CT chest concerning for right lung malignancy.  Discussed the same with patient's wife, will consult pulmonary. 4.  Progressive weakness, unsteadiness.  Physical therapy consult, patient followed by palliative care, CODE STATUS DNR as per discussion with wife. 5.  Diastolic heart failure, patient Lasix dose has been adjusted now patient has hypokalemia.  Continue IV Lasix, potassium replacement. #6 chronic kidney disease stage III: Creatinine is slightly worse than before.  Watch closely while on diuretics.  Recently was seen by Dr. Rockey Situ on June 30.  Are reviewed  and case discussed with ED provider. Management plans discussed with the patient, family and they are in agreement.  CODE STATUS: DNR TOTAL TIME TAKING CARE OF THIS PATIENT: 55 minutes.    Epifanio Lesches M.D on 05/01/2019 at 6:18 PM  Between 7am to 6pm - Pager - [phone]  After 6pm go to [email or web address] - password [email or web address] [facility]  Office  [phone]  CC: Primary care physician; Asencion Noble, MD  Note: This dictation was prepared with Dragon dictation along with smaller phrase technology. Any transcriptional errors that result from this process are unintentional.

## 2019-05-01 NOTE — Telephone Encounter (Signed)
Telephone call to patient's home to schedule palliative care visit.  Wife in agreement with RN making home visit today at 1:00.

## 2019-05-01 NOTE — ED Notes (Signed)
ED TO INPATIENT HANDOFF REPORT  ED Nurse Name and Phone #: [phone]  S Name/Age/Gender [name] 73 y.o. male Room/Bed: ED01A/ED01A  Code Status   Code Status: Prior  Home/SNF/Other Home Patient oriented to: self, place, time and situation Is this baseline? Yes   Triage Complete: Triage complete  Chief Complaint sob  Triage Note Pt from home via ems with reports that pt has been having increased sob and swelling for the past few days. Pt has hx of copd and chf. Pts RA sats when ems arrived was upper 80's. Pt denies pain.   Allergies Allergies  Allergen Reactions  . Other Other (See Comments)    Hydromet cough syrup-causes GI upset  . Codeine Nausea And Vomiting    Level of Care/Admitting Diagnosis ED Disposition    ED Disposition Condition Comment   Admit  The patient appears reasonably stabilized for admission considering the current resources, flow, and capabilities available in the ED at this time, and I doubt any other [facility] requiring further screening and/or treatment in the ED prior to admission is  present.       B Medical/Surgery History Past Medical History:  Diagnosis Date  . Acute MI [facility])    pt denies  . Arthritis of lumbar spine   . Asthma   . Clotting disorder ([place])   . Collapsed lung    x3  . COPD (chronic obstructive pulmonary disease) ([place])   . Dyspnea   . History of benign thyroid tumor   . Lung cancer ([place])   . Mycobacterium avium complex ([place]) 06/24/2016  . Pneumonia   . Pneumothorax   . Small cell carcinoma of lung ([place])   . Spontaneous pneumothorax    Past Surgical History:  Procedure Laterality Date  . APPENDECTOMY    . benign thryoid nodule resected    . CARDIAC CATHETERIZATION  2013   [facility]  . CHEST TUBE INSERTION    . COLONOSCOPY N/A 08/30/2015   Procedure: COLONOSCOPY;  Surgeon: Rogene Houston, MD;  Location: [facility];  Service: Endoscopy;  Laterality: N/A;  1:45  . left upper lobectomy for small cell lung cancer     . VATS R thoracotomy       A IV Location/Drains/Wounds Patient Lines/Drains/Airways Status   Active Line/Drains/Airways    Name:   Placement date:   Placement time:   Site:   Days:   Peripheral IV 05/01/19 Right Forearm   05/01/19    1504    Forearm   less than 1   External Urinary Catheter   02/11/19    2300    -   79          Intake/Output Last 24 hours No intake or output data in the 24 hours ending 05/01/19 1641  Labs/Imaging Results for orders placed or performed during the hospital encounter of 05/01/19 (from the past 48 hour(s))  Brain natriuretic peptide     Status: Abnormal   Collection Time: 05/01/19  1:57 PM  Result Value Ref Range   B Natriuretic Peptide 137.0 (H) 0.0 - 100.0 pg/mL    Comment: Performed at [facility], [address] [place], [place] [address]  Comprehensive metabolic panel     Status: Abnormal   Collection Time: 05/01/19  1:57 PM  Result Value Ref Range   Sodium 135 135 - 145 mmol/L   Potassium 3.1 (L) 3.5 - 5.1 mmol/L   Chloride 94 (L) 98 - 111 mmol/L  CO2 26 22 - 32 mmol/L   Glucose, Bld 111 (H) 70 - 99 mg/dL   BUN 23 8 - 23 mg/dL   Creatinine, Ser 1.53 (H) 0.61 - 1.24 mg/dL   Calcium 8.7 (L) 8.9 - 10.3 mg/dL   Total Protein 7.0 6.5 - 8.1 g/dL   Albumin 2.9 (L) 3.5 - 5.0 g/dL   AST 16 15 - 41 U/L   ALT 16 0 - 44 U/L   Alkaline Phosphatase 70 38 - 126 U/L   Total Bilirubin 0.6 0.3 - 1.2 mg/dL   GFR calc non Af Amer 45 (L) >60 mL/min   GFR calc Af Amer 52 (L) >60 mL/min   Anion gap 15 5 - 15    Comment: Performed at [facility], [address] [place],  [address]  CBC with Differential     Status: Abnormal   Collection Time: 05/01/19  1:57 PM  Result Value Ref Range   WBC 18.9 (H) 4.0 - 10.5 K/uL   RBC 3.32 (L) 4.22 - 5.81 MIL/uL   Hemoglobin 8.8 (L) 13.0 - 17.0 g/dL   HCT 29.1 (L) 39.0 - 52.0 %   MCV 87.7 80.0 - 100.0 fL   MCH 26.5 26.0 - 34.0 pg   MCHC 30.2 30.0 - 36.0 g/dL   RDW 15.2 11.5 -  15.5 %   Platelets 533 (H) 150 - 400 K/uL   nRBC 0.0 0.0 - 0.2 %   Neutrophils Relative % 82 %   Neutro Abs 15.6 (H) 1.7 - 7.7 K/uL   Lymphocytes Relative 5 %   Lymphs Abs 0.9 0.7 - 4.0 K/uL   Monocytes Relative 7 %   Monocytes Absolute 1.2 (H) 0.1 - 1.0 K/uL   Eosinophils Relative 4 %   Eosinophils Absolute 0.8 (H) 0.0 - 0.5 K/uL   Basophils Relative 1 %   Basophils Absolute 0.1 0.0 - 0.1 K/uL   Immature Granulocytes 1 %   Abs Immature Granulocytes 0.18 (H) 0.00 - 0.07 K/uL    Comment: Performed at [facility], [place], [place] [address]  Troponin I (High Sensitivity)     Status: None   Collection Time: 05/01/19  1:57 PM  Result Value Ref Range   Troponin I (High Sensitivity) 12 <18 ng/L    Comment: (NOTE) Elevated high sensitivity troponin I (hsTnI) values and significant  changes across serial measurements may suggest ACS but many other  chronic and acute conditions are known to elevate hsTnI results.  Refer to the "Links" section for chest pain algorithms and additional  guidance. Performed at [facility], [address] [place],  [address]   SARS Coronavirus 2 [facility] order, Performed in [facility] hospital lab) Nasopharyngeal Nasopharyngeal Swab     Status: None   Collection Time: 05/01/19  2:10 PM   Specimen: Nasopharyngeal Swab  Result Value Ref Range   SARS Coronavirus 2 NEGATIVE NEGATIVE    Comment: (NOTE) If result is NEGATIVE SARS-CoV-2 target nucleic acids are NOT DETECTED. The SARS-CoV-2 RNA is generally detectable in upper and lower  respiratory specimens during the acute phase of infection. The lowest  concentration of SARS-CoV-2 viral copies this assay can detect is 250  copies / mL. A negative result does not preclude SARS-CoV-2 infection  and should not be used as the sole basis for treatment or other  patient management decisions.  A negative result may occur with  improper specimen collection / handling,  submission of specimen other  than nasopharyngeal swab, presence of viral mutation(s) within the  areas targeted by this assay, and inadequate number of viral copies  (<250 copies / mL). A negative result must be combined with clinical  observations, patient history, and epidemiological information. If result is POSITIVE SARS-CoV-2 target nucleic acids are DETECTED. The SARS-CoV-2 RNA is generally detectable in upper and lower  respiratory specimens dur ing the acute phase of infection.  Positive  results are indicative of active infection with SARS-CoV-2.  Clinical  correlation with patient history and other diagnostic information is  necessary to determine patient infection status.  Positive results do  not rule out bacterial infection or co-infection with other viruses. If result is PRESUMPTIVE POSTIVE SARS-CoV-2 nucleic acids MAY BE PRESENT.   A presumptive positive result was obtained on the submitted specimen  and confirmed on repeat testing.  While 2019 novel coronavirus  (SARS-CoV-2) nucleic acids may be present in the submitted sample  additional confirmatory testing may be necessary for epidemiological  and / or clinical management purposes  to differentiate between  SARS-CoV-2 and other Sarbecovirus currently known to infect humans.  If clinically indicated additional testing with an alternate test  methodology [phone]) is advised. The SARS-CoV-2 RNA is generally  detectable in upper and lower respiratory sp ecimens during the acute  phase of infection. The expected result is Negative. Fact Sheet for Patients:  StrictlyIdeas.no Fact Sheet for Healthcare Providers: BankingDealers.co.za This test is not yet approved or cleared by the [place] FDA and has been authorized for detection and/or diagnosis of SARS-CoV-2 by FDA under an Emergency Use Authorization (EUA).  This EUA will remain in effect (meaning this test can be  used) for the duration of the COVID-19 declaration under Section 564(b)(1) of the Act, 21 U.S.C. section 360bbb-3(b)(1), unless the authorization is terminated or revoked sooner. Performed at [facility], [address] [place], [place] [address]    Dg Chest Port 1 View  Result Date: 05/01/2019 CLINICAL DATA:  Increasing shortness of breath, lung cancer EXAM: PORTABLE CHEST 1 VIEW COMPARISON:  02/11/2019 FINDINGS: There has been an interval increase in dense, masslike opacity of the apical and paramedian right upper lobe. Redemonstrated extensive scarring and volume loss of the left lung with a large cavitary lesion at the left lung base. Diffuse, coarse interstitial opacity of the aerated portions of the right lung. The heart and mediastinal contours are largely obscured. IMPRESSION: There has been an interval increase in dense, masslike opacity of the apical and paramedian right upper lobe. Redemonstrated extensive scarring and volume loss of the left lung with a large cavitary lesion at the left lung base. Diffuse, coarse interstitial opacity of the aerated portions of the right lung. The heart and mediastinal contours are largely obscured. Consider CT to further evaluate for malignant recurrence or acutely superimposed airspace disease. Electronically Signed   By: Eddie Candle M.D.   On: 05/01/2019 14:30    Pending Labs Unresulted Labs (From admission, onward)    Start     Ordered   05/01/19 1357  Urinalysis, Complete w Microscopic  ONCE - STAT,   STAT     05/01/19 1356          Vitals/Pain Today's Vitals   05/01/19 1415 05/01/19 1427 05/01/19 1512 05/01/19 1600  BP: (!) 116/58  114/60 121/65  Pulse: 89     Resp: (!) 22  (!) 24 (!) 23  Temp: 98.3 F (36.8 C)     TempSrc: Oral  SpO2: 94%  98% 97%  Weight:  59 kg    PainSc: 0-No pain 0-No pain 0-No pain     Isolation Precautions No active isolations  Medications Medications  azithromycin (ZITHROMAX) 500 mg in  sodium chloride 0.9 % 250 mL IVPB (500 mg Intravenous New Bag/Given 05/01/19 1559)  cefTRIAXone (ROCEPHIN) 1 g in sodium chloride 0.9 % 100 mL IVPB (0 g Intravenous Stopped 05/01/19 1559)  iohexol (OMNIPAQUE) 350 MG/ML injection 60 mL (60 mLs Intravenous Contrast Given 05/01/19 1621)    Mobility walks with device Low fall risk   Focused Assessments Cardiac Assessment Handoff:  Cardiac Rhythm: Normal sinus rhythm Lab Results  Component Value Date   CKTOTAL 277 (H) 05/10/2012   CKMB 15.0 (H) 05/10/2012   TROPONINI <0.03 02/09/2019   No results found for: DDIMER Does the Patient currently have chest pain? No  , Pulmonary Assessment Handoff:  Lung sounds: Bilateral Breath Sounds: Clear L Breath Sounds: Clear R Breath Sounds: Clear O2 Device: Nasal Cannula O2 Flow Rate (L/min): 2 L/min      R Recommendations: See Admitting Provider Note  Report given to:   Additional Notes: pt c/o weakness and progressive SOB over last 3-4 days, hx of COPD. Pt wearing 2 L [place] currently, received IV ABX in ER, continent with urine. No urine collected due to pt urinating immediatly upon arrival. Alert and oriented X 4. Wife updated.

## 2019-05-02 DIAGNOSIS — Z885 Allergy status to narcotic agent status: Secondary | ICD-10-CM

## 2019-05-02 DIAGNOSIS — J471 Bronchiectasis with (acute) exacerbation: Secondary | ICD-10-CM

## 2019-05-02 DIAGNOSIS — Z888 Allergy status to other drugs, medicaments and biological substances status: Secondary | ICD-10-CM

## 2019-05-02 DIAGNOSIS — J9621 Acute and chronic respiratory failure with hypoxia: Secondary | ICD-10-CM

## 2019-05-02 DIAGNOSIS — A310 Pulmonary mycobacterial infection: Principal | ICD-10-CM

## 2019-05-02 DIAGNOSIS — N189 Chronic kidney disease, unspecified: Secondary | ICD-10-CM

## 2019-05-02 DIAGNOSIS — J479 Bronchiectasis, uncomplicated: Secondary | ICD-10-CM

## 2019-05-02 DIAGNOSIS — I509 Heart failure, unspecified: Secondary | ICD-10-CM

## 2019-05-02 DIAGNOSIS — I878 Other specified disorders of veins: Secondary | ICD-10-CM

## 2019-05-02 DIAGNOSIS — E876 Hypokalemia: Secondary | ICD-10-CM

## 2019-05-02 DIAGNOSIS — Z87891 Personal history of nicotine dependence: Secondary | ICD-10-CM

## 2019-05-02 DIAGNOSIS — C3490 Malignant neoplasm of unspecified part of unspecified bronchus or lung: Secondary | ICD-10-CM

## 2019-05-02 DIAGNOSIS — Z902 Acquired absence of lung [part of]: Secondary | ICD-10-CM

## 2019-05-02 DIAGNOSIS — J9601 Acute respiratory failure with hypoxia: Secondary | ICD-10-CM

## 2019-05-02 LAB — CBC
HCT: 25.2 % — ABNORMAL LOW (ref 39.0–52.0)
Hemoglobin: 7.4 g/dL — ABNORMAL LOW (ref 13.0–17.0)
MCH: 26 pg (ref 26.0–34.0)
MCHC: 29.4 g/dL — ABNORMAL LOW (ref 30.0–36.0)
MCV: 88.4 fL (ref 80.0–100.0)
Platelets: 478 10*3/uL — ABNORMAL HIGH (ref 150–400)
RBC: 2.85 MIL/uL — ABNORMAL LOW (ref 4.22–5.81)
RDW: 15.3 % (ref 11.5–15.5)
WBC: 15.9 10*3/uL — ABNORMAL HIGH (ref 4.0–10.5)
nRBC: 0 % (ref 0.0–0.2)

## 2019-05-02 LAB — URINALYSIS, COMPLETE (UACMP) WITH MICROSCOPIC
Bacteria, UA: NONE SEEN
Bilirubin Urine: NEGATIVE
Glucose, UA: NEGATIVE mg/dL
Hgb urine dipstick: NEGATIVE
Ketones, ur: NEGATIVE mg/dL
Leukocytes,Ua: NEGATIVE
Nitrite: NEGATIVE
Protein, ur: 30 mg/dL — AB
Specific Gravity, Urine: 1.027 (ref 1.005–1.030)
pH: 6 (ref 5.0–8.0)

## 2019-05-02 LAB — BLOOD GAS, ARTERIAL
Acid-Base Excess: 5.1 mmol/L — ABNORMAL HIGH (ref 0.0–2.0)
Bicarbonate: 34.3 mmol/L — ABNORMAL HIGH (ref 20.0–28.0)
FIO2: 0.32
O2 Saturation: 98.7 %
Patient temperature: 37
pCO2 arterial: 73 mmHg (ref 32.0–48.0)
pH, Arterial: 7.28 — ABNORMAL LOW (ref 7.350–7.450)
pO2, Arterial: 133 mmHg — ABNORMAL HIGH (ref 83.0–108.0)

## 2019-05-02 LAB — GLUCOSE, CAPILLARY: Glucose-Capillary: 87 mg/dL (ref 70–99)

## 2019-05-02 LAB — BASIC METABOLIC PANEL
Anion gap: 8 (ref 5–15)
BUN: 19 mg/dL (ref 8–23)
CO2: 29 mmol/L (ref 22–32)
Calcium: 8.2 mg/dL — ABNORMAL LOW (ref 8.9–10.3)
Chloride: 100 mmol/L (ref 98–111)
Creatinine, Ser: 1.47 mg/dL — ABNORMAL HIGH (ref 0.61–1.24)
GFR calc Af Amer: 54 mL/min — ABNORMAL LOW (ref >60)
GFR calc non Af Amer: 47 mL/min — ABNORMAL LOW (ref >60)
Glucose, Bld: 99 mg/dL (ref 70–99)
Potassium: 2.9 mmol/L — ABNORMAL LOW (ref 3.5–5.1)
Sodium: 137 mmol/L (ref 135–145)

## 2019-05-02 LAB — MAGNESIUM: Magnesium: 2.1 mg/dL (ref 1.7–2.4)

## 2019-05-02 MED ORDER — PREDNISONE 20 MG PO TABS
40.0000 mg | ORAL_TABLET | Freq: Every day | ORAL | Status: DC
  Administered 2019-05-02 – 2019-05-05 (×4): 40 mg via ORAL
  Filled 2019-05-02 (×4): qty 2

## 2019-05-02 MED ORDER — ENSURE ENLIVE PO LIQD
237.0000 mL | Freq: Two times a day (BID) | ORAL | Status: DC
  Administered 2019-05-03 – 2019-05-05 (×4): 237 mL via ORAL

## 2019-05-02 MED ORDER — IPRATROPIUM-ALBUTEROL 0.5-2.5 (3) MG/3ML IN SOLN
3.0000 mL | Freq: Four times a day (QID) | RESPIRATORY_TRACT | Status: DC
  Administered 2019-05-02 – 2019-05-05 (×11): 3 mL via RESPIRATORY_TRACT
  Filled 2019-05-02 (×11): qty 3

## 2019-05-02 MED ORDER — TOBRAMYCIN 300 MG/5ML IN NEBU
300.0000 mg | INHALATION_SOLUTION | Freq: Every day | RESPIRATORY_TRACT | Status: DC
  Administered 2019-05-04 – 2019-05-05 (×2): 300 mg via RESPIRATORY_TRACT
  Filled 2019-05-02 (×4): qty 5

## 2019-05-02 MED ORDER — POTASSIUM CHLORIDE CRYS ER 20 MEQ PO TBCR
40.0000 meq | EXTENDED_RELEASE_TABLET | Freq: Two times a day (BID) | ORAL | Status: DC
  Administered 2019-05-02 – 2019-05-05 (×6): 40 meq via ORAL
  Filled 2019-05-02 (×6): qty 2

## 2019-05-02 MED ORDER — SODIUM CHLORIDE 0.9 % IV SOLN
2.0000 g | Freq: Two times a day (BID) | INTRAVENOUS | Status: DC
  Administered 2019-05-02 – 2019-05-05 (×5): 2 g via INTRAVENOUS
  Filled 2019-05-02 (×8): qty 2

## 2019-05-02 MED ORDER — ADULT MULTIVITAMIN W/MINERALS CH
1.0000 | ORAL_TABLET | Freq: Every day | ORAL | Status: DC
  Administered 2019-05-03 – 2019-05-05 (×3): 1 via ORAL
  Filled 2019-05-02 (×3): qty 1

## 2019-05-02 MED ORDER — AZITHROMYCIN 250 MG PO TABS
250.0000 mg | ORAL_TABLET | ORAL | Status: DC
  Administered 2019-05-02 – 2019-05-04 (×2): 250 mg via ORAL
  Filled 2019-05-02: qty 1

## 2019-05-02 NOTE — Progress Notes (Signed)
On call MD notified of blood gas results. MD will call RN back with any further instructions. I will continue to assess.

## 2019-05-02 NOTE — Progress Notes (Signed)
Initial Nutrition Assessment  DOCUMENTATION CODES:   Not applicable  INTERVENTION:   Ensure Enlive po BID, each supplement provides 350 kcal and 20 grams of protein  MVI daily   Recommend liberal diet once advanced   NUTRITION DIAGNOSIS:   Increased nutrient needs related to catabolic illness(COPD, lung mass) as evidenced by increased estimated needs.  GOAL:   Patient will meet greater than or equal to 90% of their needs  MONITOR:   PO intake, Supplement acceptance, Labs, Weight trends, Skin, I & O's  REASON FOR ASSESSMENT:   Malnutrition Screening Tool    ASSESSMENT:   73 y.o. male with a known history of left lung cancer s/p resection, new lung mass, COPD, MAI, history of diastolic heart failure admitted for worsening shortness of breath   RD working remotely.  Pt with increased estimated needs r/t lung mass and COPD. Pt currently NPO. RD will add supplements and MVI to help pt meet his estimated needs once diet advanced. Per chart, pt with 16lb(11%) over the past 2-3 months; this is significant weight loss.   Medications reviewed and include: aspirin, azithromycin, colace, lovenox, lasix, KCl, ceftriaxone   Labs reviewed: K 2.9(L), creat 1.47(H), Mg 2.1 wnl Wbc- 15.9(H), Hgb 7.4(L), Hct 25.2(L)  Unable to complete Nutrition-Focused physical exam at this time.   Diet Order:   Diet Order    None     EDUCATION NEEDS:   No education needs have been identified at this time  Skin:  Skin Assessment: Reviewed RN Assessment(Stage II buttocks)  Last BM:  pta  Height:   Ht Readings from Last 1 Encounters:  05/01/19 5\' 7"  (1.702 m)    Weight:   Wt Readings from Last 1 Encounters:  05/02/19 59.4 kg    Ideal Body Weight:  67 kg  BMI:  Body mass index is 20.52 kg/m.  Estimated Nutritional Needs:   Kcal:  1700-2000kcal/day  Protein:  85-100g/day  Fluid:  >1.5L/day  Koleen Distance MS, RD, LDN Pager #- [phone] Office#- [phone] After  Hours Pager: [phone]

## 2019-05-02 NOTE — Plan of Care (Signed)
  Problem: Education: Goal: Knowledge of General Education information will improve Description: Including pain rating scale, medication(s)/side effects and non-pharmacologic comfort measures Outcome: Not Progressing   Problem: Clinical Measurements: Goal: Will remain free from infection Outcome: Not Progressing Goal: Respiratory complications will improve Outcome: Progressing

## 2019-05-02 NOTE — Consult Note (Addendum)
NAME: [name]  DOB: Feb 27, 1946  MRN: [id number]  Date/Time: 05/02/2019 5:09 PM  REQUESTING PROVIDER: Alva Garnet Subjective:  REASON FOR CONSULT: bronchiectasis/ ?No history from patient- chart reviewed TRUSTIN CHAPA is a 73 y.o. male with a history of COPD, bronchiectasis, CA lung status post left lower lobectomy is admitted with weakness, increasing shortness of breath And leg swelling for the past 7 days.. Patient has had shortness of breath for the past week but he has been gradually getting fatigued to the point that he was not able to get up and walk and he was also short of breath and hence wife was concerned and called EMS.  When they arrived his oxygen was in the high 80s. Patient did not have any cough or fever or sick contacts.  He did not have any chest pain nausea or vomiting. He has had symmetrical lower extremity swelling for the last several weeks.  He has been on Lasix. 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  On 06/26/2016 by Dr. Drucilla Schmidt.  He was then admitted on 07/20/2016 with increasing edema hyponatremia elevated creatinine was seen by ID Dr. Ola Spurr and the treatment was put on hold.  Saw him as outpatient in December 2017 and was asked to restart MAC treatment but the patient did not start the treatment.  He was followed by Dr. Jamal Collin pulmonologist for COPD and bronchiectasis .  He was referred to me in March 2020 to see whether he would be able to get nebulized tobramycin.  Patient that time had again expressed that he did not want to take any treatment for MAC for fear of side effects.   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   Pneumonia    Pneumothorax    Small cell carcinoma of lung ([place])    Spontaneous pneumothorax     Past Surgical History:  Procedure Laterality Date   APPENDECTOMY     benign thryoid nodule resected     CARDIAC CATHETERIZATION  2013   [facility]   CHEST TUBE INSERTION     COLONOSCOPY N/A 08/30/2015   Procedure: COLONOSCOPY;  Surgeon: Rogene Houston, MD;  Location: [facility];  Service: Endoscopy;  Laterality: N/A;  1:45   left upper lobectomy for small cell lung cancer     VATS R thoracotomy      Social History   Socioeconomic History   Marital status: Married    Spouse name: Not on file   Number of children: 1   Years of education: Not on file   Highest education level: Not on file  Occupational History   Occupation: Retired    Comment: Lobbyist  strain: Not on file   Food insecurity    Worry: Not on file    Inability: Not on file   Transportation needs    Medical: Not on file    Non-medical: Not on file  Tobacco Use   Smoking status: Former Smoker    Packs/day: 3.00    Years: 30.00    Pack years: 90.00    Types: Cigars    Quit date: 03/02/1992    Years since quitting: 27.1   Smokeless tobacco: Never Used   Tobacco comment: quit 22 yrs ago  Substance and Sexual Activity   Alcohol use: No    Alcohol/week: 0.0 standard drinks   Drug use: No   Sexual activity: Yes  Lifestyle   Physical activity    Days per week: Not on file    Minutes per session: Not on file   Stress: Not on file  Relationships   Social connections     Talks on phone: Not on file    Gets together: Not on file    Attends religious service: Not on file    Active member of club or organization: Not on file    Attends meetings of clubs or organizations: Not on file    Relationship status: Not on file   Intimate partner violence    Fear of current or ex partner: Not on file    Emotionally abused: Not on file    Physically abused: Not on file    Forced sexual activity: Not on file  Other Topics Concern   Not on file  Social History Narrative   Not on file    Family History  Problem Relation Age of Onset   Heart disease Mother    Colon cancer Father    Heart failure Brother    Allergies  Allergen Reactions   Other Other (See Comments)    Hydromet cough syrup-causes GI upset   Codeine Nausea And Vomiting   ? Current Facility-Administered Medications  Medication Dose Route Frequency Provider Last Rate Last Dose   acetaminophen (TYLENOL) tablet 650 mg  650 mg Oral Q6H PRN Epifanio Lesches, MD       Or   acetaminophen (TYLENOL) suppository 650 mg  650 mg Rectal Q6H PRN Epifanio Lesches, MD       ALPRAZolam Duanne Moron) tablet 0.125-0.25 mg  0.125-0.25 mg Oral BID PRN Epifanio Lesches, MD   0.25 mg at 05/02/19 0043   aspirin EC tablet 81 mg  81 mg Oral Daily Epifanio Lesches, MD   81 mg at 05/02/19 1045   atorvastatin (LIPITOR) tablet 10 mg  10 mg Oral Daily Epifanio Lesches, MD   10 mg at 05/02/19 1045   azithromycin (ZITHROMAX) tablet 250 mg  250 mg Oral Dub Mikes, MD   250 mg at 05/02/19 1537   bisacodyl (DULCOLAX) EC tablet 5 mg  5 mg Oral Daily PRN Epifanio Lesches, MD       ceFEPIme (MAXIPIME) 2 g in sodium chloride 0.9 % 100 mL IVPB  2 g Intravenous Q12H Duncan, Asajah R, RPH 200 mL/hr at 05/02/19 1537 2 g at 05/02/19 1537   docusate sodium (COLACE) capsule 100 mg  100 mg Oral BID Epifanio Lesches, MD   100 mg at 05/02/19 1045   enoxaparin (LOVENOX) injection 40 mg  40 mg  Subcutaneous Q24H Epifanio Lesches, MD   40 mg at 05/01/19 2319   [START ON 05/03/2019] feeding supplement (ENSURE ENLIVE) (ENSURE ENLIVE) liquid 237 mL  237 mL  Oral BID BM [name], MD       furosemide (LASIX) injection 20 mg  20 mg Intravenous BID Epifanio Lesches, MD   20 mg at 05/02/19 1045   ipratropium-albuterol (DUONEB) 0.5-2.5 (3) MG/3ML nebulizer solution 3 mL  3 mL Nebulization Q6H Wilhelmina Mcardle, MD       mometasone-formoterol (DULERA) 100-5 MCG/ACT inhaler 2 puff  2 puff Inhalation BID Epifanio Lesches, MD   2 puff at 05/02/19 1051   [START ON 05/03/2019] multivitamin with minerals tablet 1 tablet  1 tablet Oral Daily [name], MD       ondansetron [facility]) tablet 4 mg  4 mg Oral Q6H PRN Epifanio Lesches, MD       Or   ondansetron (ZOFRAN) injection 4 mg  4 mg Intravenous Q6H PRN Epifanio Lesches, MD       potassium chloride SA (K-DUR) CR tablet 40 mEq  40 mEq Oral BID [name], MD   40 mEq at 05/02/19 1045   prednisoLONE acetate (PRED FORTE) 1 % ophthalmic suspension 1 drop  1 drop Left Eye BID Epifanio Lesches, MD   1 drop at 05/01/19 2320   predniSONE (DELTASONE) tablet 40 mg  40 mg Oral Q breakfast Wilhelmina Mcardle, MD   40 mg at 05/02/19 1541   [START ON 05/03/2019] tobramycin (PF) (TOBI) nebulizer solution 300 mg  300 mg Nebulization Daily Wilhelmina Mcardle, MD       traZODone (DESYREL) tablet 25 mg  25 mg Oral QHS PRN Epifanio Lesches, MD         Abtx:  Anti-infectives (From admission, onward)   Start     Dose/Rate Route Frequency Ordered Stop   05/03/19 1000  tobramycin (PF) (TOBI) nebulizer solution 300 mg     300 mg Nebulization Daily 05/02/19 1344     05/02/19 1600  azithromycin (ZITHROMAX) tablet 500 mg  Status:  Discontinued     500 mg Oral Daily 05/01/19 1841 05/02/19 1344   05/02/19 1500  cefTRIAXone (ROCEPHIN) 1 g in sodium chloride 0.9 % 100 mL IVPB  Status:  Discontinued     1 g 200  mL/hr over 30 Minutes Intravenous Every 24 hours 05/01/19 1841 05/02/19 1344   05/02/19 1500  ceFEPIme (MAXIPIME) 2 g in sodium chloride 0.9 % 100 mL IVPB     2 g 200 mL/hr over 30 Minutes Intravenous Every 12 hours 05/02/19 1354     05/02/19 1345  azithromycin (ZITHROMAX) tablet 250 mg     250 mg Oral Every other day 05/02/19 1344     05/01/19 1845  tobramycin (PF) (TOBI) nebulizer solution 300 mg  Status:  Discontinued     300 mg Nebulization Every 12 hours 05/01/19 1840 05/02/19 1344   05/01/19 1515  azithromycin (ZITHROMAX) 500 mg in sodium chloride 0.9 % 250 mL IVPB     500 mg 250 mL/hr over 60 Minutes Intravenous  Once 05/01/19 1504 05/01/19 1722   05/01/19 1515  cefTRIAXone (ROCEPHIN) 1 g in sodium chloride 0.9 % 100 mL IVPB     1 g 200 mL/hr over 30 Minutes Intravenous  Once 05/01/19 1504 05/01/19 1559      REVIEW OF SYSTEMS:  NA: Objective:  VITALS:  BP (!) 107/44 (BP Location: Right Arm)    Pulse 85    Temp 97.8 F (36.6 C) (Oral)    Resp 19    Ht _0  (1.702 m)    Wt 59.4 kg    SpO2 100%  BMI 20.52 kg/m  PHYSICAL EXAM:  General: somnolent- opens eyes to calling his name but non verbal Head: Normocephalic, without obvious abnormality, atraumatic. Eyes: Conjunctivae clear, anicteric sclerae. Pupils are equal ENT did not examine  Neck: Supple, symmetrical, no adenopathy, thyroid: non tender  Lungs: b/l air entry- crepts, decreased air entry left  Heart: s1s2 tachycardia Abdomen: Soft, non-tender,not distended. Bowel sounds normal. No masses Extremities: venous stasis legs Skin: No rashes or lesions. Or bruising Lymph: Cervical, supraclavicular normal. Neurologic: moves all his limbs spontaneously Pertinent Labs Lab Results CBC    Component Value Date/Time   WBC 15.9 (H) 05/02/2019 0516   RBC 2.85 (L) 05/02/2019 0516   HGB 7.4 (L) 05/02/2019 0516   HGB 12.0 (L) 12/16/2013 0440   HCT 25.2 (L) 05/02/2019 0516   HCT 37.6 (L) 12/16/2013 0440   PLT 478 (H)  05/02/2019 0516   PLT 432 12/16/2013 0440   MCV 88.4 05/02/2019 0516   MCV 91 12/16/2013 0440   MCH 26.0 05/02/2019 0516   MCHC 29.4 (L) 05/02/2019 0516   RDW 15.3 05/02/2019 0516   RDW 14.6 (H) 12/16/2013 0440   LYMPHSABS 0.9 05/01/2019 1357   LYMPHSABS 2.2 12/16/2013 0440   MONOABS 1.2 (H) 05/01/2019 1357   MONOABS 1.3 (H) 12/16/2013 0440   EOSABS 0.8 (H) 05/01/2019 1357   EOSABS 0.1 12/16/2013 0440   BASOSABS 0.1 05/01/2019 1357   BASOSABS 0.1 12/16/2013 0440    CMP Latest Ref Rng & Units 05/02/2019 05/01/2019 04/19/2019  Glucose 70 - 99 mg/dL 99 111(H) 125(H)  BUN 8 - 23 mg/dL _0 Creatinine 0.61 - 1.24 mg/dL 1.47(H) 1.53(H) 1.45(H)  Sodium 135 - 145 mmol/L 137 135 132(L)  Potassium 3.5 - 5.1 mmol/L 2.9(L) 3.1(L) 3.4(L)  Chloride 98 - 111 mmol/L 100 94(L) 96(L)  CO2 22 - 32 mmol/L _1 Calcium 8.9 - 10.3 mg/dL 8.2(L) 8.7(L) 8.4(L)  Total Protein 6.5 - 8.1 g/dL - 7.0 -  Total Bilirubin 0.3 - 1.2 mg/dL - 0.6 -  Alkaline Phos 38 - 126 U/L - 70 -  AST 15 - 41 U/L - 16 -  ALT 0 - 44 U/L - 16 -      Microbiology: Recent Results (from the past 240 hour(s))  SARS Coronavirus 2 [facility] order, Performed in [facility] hospital lab) Nasopharyngeal Nasopharyngeal Swab     Status: None   Collection Time: 05/01/19  2:10 PM   Specimen: Nasopharyngeal Swab  Result Value Ref Range Status   SARS Coronavirus 2 NEGATIVE NEGATIVE Final    Comment: (NOTE) If result is NEGATIVE SARS-CoV-2 target nucleic acids are NOT DETECTED. The SARS-CoV-2 RNA is generally detectable in upper and lower  respiratory specimens during the acute phase of infection. The lowest  concentration of SARS-CoV-2 viral copies this assay can detect is 250  copies / mL. A negative result does not preclude SARS-CoV-2 infection  and should not be used as the sole basis for treatment or other  patient management decisions.  A negative result may occur with  improper specimen collection / handling, submission  of specimen other  than nasopharyngeal swab, presence of viral mutation(s) within the  areas targeted by this assay, and inadequate number of viral copies  (<250 copies / mL). A negative result must be combined with clinical  observations, patient history, and epidemiological information. If result is POSITIVE SARS-CoV-2 target nucleic acids are DETECTED. The SARS-CoV-2 RNA is generally detectable in upper and lower  respiratory specimens  dur ing the acute phase of infection.  Positive  results are indicative of active infection with SARS-CoV-2.  Clinical  correlation with patient history and other diagnostic information is  necessary to determine patient infection status.  Positive results do  not rule out bacterial infection or co-infection with other viruses. If result is PRESUMPTIVE POSTIVE SARS-CoV-2 nucleic acids MAY BE PRESENT.   A presumptive positive result was obtained on the submitted specimen  and confirmed on repeat testing.  While 2019 novel coronavirus  (SARS-CoV-2) nucleic acids may be present in the submitted sample  additional confirmatory testing may be necessary for epidemiological  and / or clinical management purposes  to differentiate between  SARS-CoV-2 and other Sarbecovirus currently known to infect humans.  If clinically indicated additional testing with an alternate test  methodology [phone]) is advised. The SARS-CoV-2 RNA is generally  detectable in upper and lower respiratory sp ecimens during the acute  phase of infection. The expected result is Negative. Fact Sheet for Patients:  StrictlyIdeas.no Fact Sheet for Healthcare Providers: BankingDealers.co.za This test is not yet approved or cleared by the [place] FDA and has been authorized for detection and/or diagnosis of SARS-CoV-2 by FDA under an Emergency Use Authorization (EUA).  This EUA will remain in effect (meaning this test can be used) for  the duration of the COVID-19 declaration under Section 564(b)(1) of the Act, 21 U.S.C. section 360bbb-3(b)(1), unless the authorization is terminated or revoked sooner. Performed at [facility], [address] [place], [place] [address]     IMAGING RESULTS:  I have personally reviewed the films ?There is extensive, nodular and cavitary airspace disease of the remaining left lower lobe, with a large, thick walled cavitary lesion about the inferior left pleural space containing a large air and fluid level. This is significantly enlarged compared to prior CT dated 06/28/2016 and generally in keeping with findings on recent radiographs. There is evidence of a prior right upper lobe wedge resection with new, dense consolidation of the paramedian right upper lobe (series 6, image 34). There is likewise very extensive nodular and cavitary airspace disease of the right lung, most conspicuous in the apical right upper lobe which is worsened in comparison CT dated 06/28/2016. Small left pleural effusion.   Impression/Recommendation ? Acute hypoxic resp failure due to Bilateral ongoing cavitary lesions in both lungs with underlying COPD/Mycobacterium avium intracellulare infection. Also has underlying carcinoma of the lung status post resection.  Needs sputum cultures to see whether he is colonized with any organism.  Currently on cefepime, azithromycin.  Procalcitonin is only 0.17 so  likely there is not an overwhelming bacterial infection.  Will decide on long term suppressive therapy once culture obtained?  Untreated MAC.  Patient did not complete treatment because of severe side effects in 2017.  CKD.  Congestive heart failure  Hypokalemia   Discussed with requesting provider Note:  This document was prepared using Dragon voice recognition software and may include unintentional dictation errors.

## 2019-05-02 NOTE — Progress Notes (Signed)
PT Cancellation Note  Patient Details Name: [name] MRN: [id number] DOB: 20-Apr-1946   Cancelled Treatment:    Reason Eval/Treat Not Completed: Patient's level of consciousness. Order received and chart reviewed.  Pt currently somnolent and not able to be aroused by sternal rub or verbally.  Will re-attempt when pt is more appropriate.  Roxanne Gates, PT, DPT  Roxanne Gates 05/02/2019, 3:12 PM

## 2019-05-02 NOTE — Progress Notes (Signed)
Pt is very sleepy and lethargic. Lungs show exp wheeze and oxygen sats are 95% on [place]. MD notified for orders for blood gas. I will continue to assess.

## 2019-05-02 NOTE — Consult Note (Signed)
Pharmacy Antibiotic Note  [name] is a 73 y.o. male admitted on 05/01/2019 with Exacerbation of bronchiectasis.  Pharmacy has been consulted for Cefepime dosing.  Plan: Cefepime 2 g Q12H  Height: 5\' 7"  (170.2 cm) Weight: 131 lb (59.4 kg) IBW/kg (Calculated) : 66.1  Temp (24hrs), Avg:98.2 F (36.8 C), Min:97.8 F (36.6 C), Max:98.5 F (36.9 C)  Recent Labs  Lab 05/01/19 1357 05/02/19 0516  WBC 18.9* 15.9*  CREATININE 1.53* 1.47*    Estimated Creatinine Clearance: 38.2 mL/min (A) (by C-G formula based on SCr of 1.47 mg/dL (H)).    Allergies  Allergen Reactions  . Other Other (See Comments)    Hydromet cough syrup-causes GI upset  . Codeine Nausea And Vomiting    Antimicrobials this admission: 8/3 CTX x1 dose 8/3 Azithromycin >>  8/4 Cefepime >>   Thank you for allowing pharmacy to be a part of this patient's care.  Rowland Lathe 05/02/2019 4:09 PM

## 2019-05-02 NOTE — Progress Notes (Signed)
Please note, patient is currently followed by [facility] care outpatient Palliative at home. CSW Evette Cristal made aware. Flo Shanks BSN, RN, [facility] [employer] [phone]

## 2019-05-02 NOTE — Progress Notes (Signed)
PULMONARY HOSPITAL CONSULT NOTE  Requesting MD/Service: Anselm Jungling Date of consultation: 05/02/2019  Reason for consultation: Acute on chronic respiratory failure  PT PROFILE: 73 y.o. male well-known to me with history of pulmonary MAC, severe bronchiectasis, history of lung cancer (s/p LLL resection 2000) on chronic oxygen therapy, home palliative care.  He was admitted 8/3 with hypoxemic respiratory failure and worsening dyspnea  DATA: CT chest 08/03: Status post left upper lobectomy. There is extensive, nodular and cavitary airspace disease of the remaining left lower lobe, with a large, thick walled cavitary lesion about the inferior left pleural space containing a large air and fluid level. This is significantly enlarged compared to prior CT dated 06/28/2016    HPI:  Patient is very well-known to me.  I follow him in the office for severe and progressive bronchiectasis.  The underlying cause of bronchiectasis is pulmonary MAC.  He has never been able to tolerate specific treatment for pulmonary MAC.  Over the past several months to year, he has had progressive decline with repeated exacerbation of bronchiectasis.  He is now on oxygen therapy and uses chest percussion twice a day.  Recently, I started him on nebulized tobramycin.  He is on chronic bronchodilator therapy.  Recently, I enrolled him and palliative care as an outpatient.  Despite my efforts, he has progressively declined and was admitted yesterday with the admission history as documented.  He has not had any hemoptysis.  He has not had any fevers.  He is making moderate to large amount of mucopurulent sputum.  He has lost approximately 15 pounds over the past year.  Past Medical History:  Diagnosis Date  . Acute MI [facility])    pt denies  . Arthritis of lumbar spine   . Asthma   . Clotting disorder ([place])   . Collapsed lung    x3  . COPD (chronic obstructive pulmonary disease) ([place])   . Dyspnea   . History of benign thyroid tumor    . Lung cancer ([place])   . Mycobacterium avium complex ([place]) 06/24/2016  . Pneumonia   . Pneumothorax   . Small cell carcinoma of lung ([place])   . Spontaneous pneumothorax     Past Surgical History:  Procedure Laterality Date  . APPENDECTOMY    . benign thryoid nodule resected    . CARDIAC CATHETERIZATION  2013   [facility]  . CHEST TUBE INSERTION    . COLONOSCOPY N/A 08/30/2015   Procedure: COLONOSCOPY;  Surgeon: Rogene Houston, MD;  Location: [facility];  Service: Endoscopy;  Laterality: N/A;  1:45  . left upper lobectomy for small cell lung cancer    . VATS R thoracotomy      MEDICATIONS: I have reviewed all medications and confirmed regimen as documented  Social History   Socioeconomic History  . Marital status: Married    Spouse name: Not on file  . Number of children: 1  . Years of education: Not on file  . Highest education level: Not on file  Occupational History  . Occupation: Retired    Comment: Manager of [employer]  . Financial resource strain: Not on file  . Food insecurity    Worry: Not on file    Inability: Not on file  . Transportation needs    Medical: Not on file    Non-medical: Not on file  Tobacco Use  . Smoking status: Former Smoker    Packs/day: 3.00    Years: 30.00  Pack years: 90.00    Types: Cigars    Quit date: 03/02/1992    Years since quitting: 27.1  . Smokeless tobacco: Never Used  . Tobacco comment: quit 22 yrs ago  Substance and Sexual Activity  . Alcohol use: No    Alcohol/week: 0.0 standard drinks  . Drug use: No  . Sexual activity: Yes  Lifestyle  . Physical activity    Days per week: Not on file    Minutes per session: Not on file  . Stress: Not on file  Relationships  . Social Herbalist on phone: Not on file    Gets together: Not on file    Attends religious service: Not on file    Active member of club or organization: Not on file    Attends meetings of clubs or organizations: Not on file     Relationship status: Not on file  . Intimate partner violence    Fear of current or ex partner: Not on file    Emotionally abused: Not on file    Physically abused: Not on file    Forced sexual activity: Not on file  Other Topics Concern  . Not on file  Social History Narrative  . Not on file    Family History  Problem Relation Age of Onset  . Heart disease Mother   . Colon cancer Father   . Heart failure Brother     ROS: No fever, myalgias/arthralgias No new focal weakness or sensory deficits No otalgia, hearing loss, visual changes, nasal and sinus symptoms, mouth and throat problems No neck pain or adenopathy No abdominal pain, N/V/D, diarrhea, change in bowel pattern No dysuria, change in urinary pattern   Vitals:   05/02/19 0500 05/02/19 0736 05/02/19 1148 05/02/19 1522  BP:  90/73  (!) 107/44  Pulse:  82 87 85  Resp:  _0 Temp:  97.8 F (36.6 C)    TempSrc:  Oral    SpO2:  96% 96% 100%  Weight: 59.4 kg     Height:         EXAM:  Gen: Very frail-appearing, no overt distress at rest, coughing intermittently, cognition intact HEENT: NCAT, sclera white, oropharynx normal, temporal wasting Neck: Supple without LAN, thyromegaly, JVD Lungs: breath sounds diffusely coarse with bilateral rhonchi Cardiovascular: RRR, no murmurs noted Abdomen: Soft, nontender, normal BS Ext: without clubbing, cyanosis, edema Neuro: CNs grossly intact, motor and sensory intact Skin: Limited exam, no lesions noted  DATA:   BMP Latest Ref Rng & Units 05/02/2019 05/01/2019 04/19/2019  Glucose 70 - 99 mg/dL 99 111(H) 125(H)  BUN 8 - 23 mg/dL _1 Creatinine 0.61 - 1.24 mg/dL 1.47(H) 1.53(H) 1.45(H)  BUN/Creat Ratio 10 - 24 - - -  Sodium 135 - 145 mmol/L 137 135 132(L)  Potassium 3.5 - 5.1 mmol/L 2.9(L) 3.1(L) 3.4(L)  Chloride 98 - 111 mmol/L 100 94(L) 96(L)  CO2 22 - 32 mmol/L _2 Calcium 8.9 - 10.3 mg/dL 8.2(L) 8.7(L) 8.4(L)    CBC Latest Ref Rng & Units 05/02/2019  05/01/2019 02/12/2019  WBC 4.0 - 10.5 K/uL 15.9(H) 18.9(H) 16.7(H)  Hemoglobin 13.0 - 17.0 g/dL 7.4(L) 8.8(L) 9.4(L)  Hematocrit 39.0 - 52.0 % 25.2(L) 29.1(L) 29.4(L)  Platelets 150 - 400 K/uL 478(H) 533(H) 523(H)    CXR: Extensive volume loss and infiltrate on L (chronic).  Patchy, predominantly RUL, opacities on R  I have personally reviewed all chest radiographs reported  above including CXRs and CT chest unless otherwise indicated.  Since prior film in May, the volume loss on L appears worse  IMPRESSION:   H/O lung cancer, S/P LUL resection 2000 Pulmonary MAC - previously intolerant to attempts at treatment Very severe bronchiectasis with acute exacerbation Severe COPD Acute on chronic hypoxemic respiratory failure  General course of inexorable decline over past 12 months   PLAN:  Change abx to cefepime to provide Pseudomonas coverage Respiratory culture to rule out MRSA.  If positive for this, he will need vancomycin Would recommend a relatively prolonged course of antibiotics (2-3 weeks) We might consider chronic suppressive antibiotics Continue nebulized bronchodilators.  Dosing schedule adjusted Continue nebulized tobramycin.  Changed to daily  He has had difficulty tolerating twice a day dosing Prednisone 40 mg daily for 5 days ordered Palliative care consultation requested ID consultation requested to address antibiotic choice, duration, etc.  I have discussed with Dr. Delaine Lame   Discussed goals of care with patient and his wife.  He is getting to the point where she is no longer able to provide adequate care for him at home.  Would consider alternatives such as SNF with hospice versus residential hospice.  I communicated with Palliative Care regarding this   Merton Border, MD PCCM service Mobile [phone] Pager [phone] 05/02/2019 4:04 PM

## 2019-05-02 NOTE — Progress Notes (Addendum)
Patient ID: [name], male   DOB: [birth year], 73 y.o.   MRN: [id number] called by RN to evaluate patient. Patient has been almost lethargic all day unable to be arrives. He has not been able to take his oral meds as well. He is overall declining given poor lung condition. Spoke with Dr. Jamal Collin and Dr. Anselm Jungling. Palliative care has seen patient as outpatient one time at home. Wife [name] who I spoke with at length on the phone understands patient's poor prognosis and overall gradual decline. Wife does not want any aggressive measures at present. Patient is a DNR. Did discuss with wife to consider options for home with hospice versus hospice facility. She will discussed with Dr. Anselm Jungling tomorrow.  Will continue to monitor. RN informed.  Time 20 mins

## 2019-05-02 NOTE — Progress Notes (Signed)
[facility] [place] NAME: [name]    MR#:  [id number]  DATE OF BIRTH:  Oct 31, 1945  SUBJECTIVE:  CHIEF COMPLAINT:   Chief Complaint  Patient presents with  . Shortness of Breath  . Congestive Heart Failure   Came with worsening SOB, loss of weight. He had failed MAC treatment a few years ago. Pt is not talking much, wife is in room.  REVIEW OF SYSTEMS:  CONSTITUTIONAL: No fever, have fatigue or weakness.  EYES: No blurred or double vision.  EARS, NOSE, AND THROAT: No tinnitus or ear pain.  RESPIRATORY: Have cough, shortness of breath, no wheezing or hemoptysis.  CARDIOVASCULAR: No chest pain, orthopnea, edema.  GASTROINTESTINAL: No nausea, vomiting, diarrhea or abdominal pain.  GENITOURINARY: No dysuria, hematuria.  ENDOCRINE: No polyuria, nocturia,  HEMATOLOGY: No anemia, easy bruising or bleeding SKIN: No rash or lesion. MUSCULOSKELETAL: No joint pain or arthritis.   NEUROLOGIC: No tingling, numbness, weakness.  PSYCHIATRY: No anxiety or depression.   ROS  DRUG ALLERGIES:   Allergies  Allergen Reactions  . Other Other (See Comments)    Hydromet cough syrup-causes GI upset  . Codeine Nausea And Vomiting    VITALS:  Blood pressure (!) 107/44, pulse 85, temperature 97.8 F (36.6 C), temperature source Oral, resp. rate 19, height _0  (1.702 m), weight 59.4 kg, SpO2 100 %.  PHYSICAL EXAMINATION:   GENERAL:  73 y.o.-year-old thin patient lying in the bed, appears chronically ill. EYES: Pupils equal, round, reactive to light and accommodation. No scleral icterus. Extraocular muscles intact.  HEENT: Head atraumatic, normocephalic. Oropharynx and nasopharynx clear.  NECK:  Supple, no jugular venous distention. No thyroid enlargement, no tenderness.  LUNGS: Patient has bilateral wheezing. CARDIOVASCULAR: S1, S2 normal. No murmurs, rubs, or gallops.  ABDOMEN: Soft, nontender, nondistended. Bowel sounds present. No  organomegaly or mass.  EXTREMITIES: 2+ pedal edema NEUROLOGIC: Cranial nerves II through XII are intact. Muscle strength 3/5 in all extremities. Sensation intact. Gait not checked.  PSYCHIATRIC: The patient is alert and oriented x 3.  SKIN: No obvious rash, lesion, or ulcer.   Physical Exam LABORATORY PANEL:   CBC Recent Labs  Lab 05/02/19 0516  WBC 15.9*  HGB 7.4*  HCT 25.2*  PLT 478*   ------------------------------------------------------------------------------------------------------------------  Chemistries  Recent Labs  Lab 05/01/19 1357 05/02/19 0516  NA 135 137  K 3.1* 2.9*  CL 94* 100  CO2 26 29  GLUCOSE 111* 99  BUN 23 19  CREATININE 1.53* 1.47*  CALCIUM 8.7* 8.2*  MG  --  2.1  AST 16  --   ALT 16  --   ALKPHOS 70  --   BILITOT 0.6  --    ------------------------------------------------------------------------------------------------------------------  Cardiac Enzymes No results for input(s): TROPONINI in the last 168 hours. ------------------------------------------------------------------------------------------------------------------  RADIOLOGY:  Ct Angio Chest Pe W/cm &/or Wo Cm  Result Date: 05/01/2019 CLINICAL DATA:  Shortness of breath, productive cough EXAM: CT ANGIOGRAPHY CHEST WITH CONTRAST TECHNIQUE: Multidetector CT imaging of the chest was performed using the standard protocol during bolus administration of intravenous contrast. Multiplanar CT image reconstructions and MIPs were obtained to evaluate the vascular anatomy. CONTRAST:  58m OMNIPAQUE IOHEXOL 350 MG/ML SOLN COMPARISON:  Same day chest radiograph, CT chest, 06/28/2016 FINDINGS: Cardiovascular: Satisfactory opacification of the pulmonary arteries to the segmental level. No evidence of pulmonary embolism. Normal heart size. Scattered left coronary artery calcifications. No pericardial effusion. Aortic atherosclerosis. Mediastinum/Nodes: No enlarged mediastinal, hilar, or axillary lymph  nodes. Thyroid gland, trachea, and esophagus demonstrate no significant findings. Lungs/Pleura: Redemonstrated postoperative findings of left upper lobectomy. There is extensive, nodular and cavitary airspace disease of the remaining left lower lobe, with a large, thick walled cavitary lesion about the inferior left pleural space containing a large air and fluid level. This is significantly enlarged compared to prior CT dated 06/28/2016 and generally in keeping with findings on recent radiographs. There is evidence of a prior right upper lobe wedge resection with new, dense consolidation of the paramedian right upper lobe (series 6, image 34). There is likewise very extensive nodular and cavitary airspace disease of the right lung, most conspicuous in the apical right upper lobe which is worsened in comparison CT dated 06/28/2016. Small left pleural effusion. Upper Abdomen: No acute abnormality. Musculoskeletal: No chest wall abnormality. No acute or significant osseous findings. Review of the MIP images confirms the above findings. IMPRESSION: 1.  Negative examination for pulmonary embolism. 2. Redemonstrated postoperative findings of left upper lobectomy. There is extensive, nodular and cavitary airspace disease of the remaining left lower lobe, with a large, thick walled cavitary lesion about the inferior left pleural space containing a large air and fluid level. This is significantly enlarged compared to prior CT dated 06/28/2016 and generally in keeping with findings on recent radiographs. There is evidence of a prior right upper lobe wedge resection with new, dense consolidation of the paramedian right upper lobe (series 6, image 34). There is likewise very extensive nodular and cavitary airspace disease of the right lung, most conspicuous in the apical right upper lobe which is worsened in comparison CT dated 06/28/2016. Small left pleural effusion. 3. Overall constellation of findings is generally indicative  of ongoing chronic infection possibly with underlying findings of metastatic pulmonary disease. Dense consolidation of the paramedian right upper lobe is the most concerning for malignancy, although could represent acute airspace disease, and could be further evaluated by PET-CT for abnormal FDG avidity. Interval CT or PET imaging dated after 06/28/2016 would be helpful for comparison if available. 4.  Coronary artery disease and aortic atherosclerosis. Electronically Signed   By: Eddie Candle M.D.   On: 05/01/2019 16:49   Dg Chest Port 1 View  Result Date: 05/01/2019 CLINICAL DATA:  Increasing shortness of breath, lung cancer EXAM: PORTABLE CHEST 1 VIEW COMPARISON:  02/11/2019 FINDINGS: There has been an interval increase in dense, masslike opacity of the apical and paramedian right upper lobe. Redemonstrated extensive scarring and volume loss of the left lung with a large cavitary lesion at the left lung base. Diffuse, coarse interstitial opacity of the aerated portions of the right lung. The heart and mediastinal contours are largely obscured. IMPRESSION: There has been an interval increase in dense, masslike opacity of the apical and paramedian right upper lobe. Redemonstrated extensive scarring and volume loss of the left lung with a large cavitary lesion at the left lung base. Diffuse, coarse interstitial opacity of the aerated portions of the right lung. The heart and mediastinal contours are largely obscured. Consider CT to further evaluate for malignant recurrence or acutely superimposed airspace disease. Electronically Signed   By: Eddie Candle M.D.   On: 05/01/2019 14:30    ASSESSMENT AND PLAN:   Active Problems:   Acute respiratory failure with hypoxia [facility])  73 year old male patient with multiple medical problems of diastolic heart failure, essential hypertension, history of previous left lung cancer status post surgery, iron deficiency anemia gets iron shots with Dr. Grayland Ormond comes in  because of  worsening shortness of breath, fatigue.  1.  Acute respiratory failure with hypoxia, oxygen saturation 80% on room air when he came, now on 4 L of oxygen, patient acute respiratory failure likely secondary to COPD exacerbation and also right-sided pneumonia, unable to exclude malignancy on the right side Dr.Simonds for pulm consult. No malignancy, it seems to be bronchiectasis complication.   2 severe bronchiectasis secondary to MAC, intolerant attempt at treating MAC, recurrent exacerbations of bronchiectasis, refractory to systemic antibiotic therapy, patient is tobramycin inhalers, chest percussion therapy as per Dr. Rosita Fire , patient was admitted in May for similar complaints.  As per Dr. Alva Garnet- Change Abx coverage to cefepime for Pseudomonas and check for MRSA in sputum. ID consulted per pulm. Pt might need long term suppression therapy.  3.  History of for left lung cancer status post resection in 2000,   consult pulmonary.  4.  Progressive weakness, unsteadiness.  Physical therapy consult, patient followed by palliative care, CODE STATUS DNR as per discussion with wife. Long term prognosis very poor- Called palliative care consult in hospital.  5.  Diastolic heart failure, patient Lasix dose has been adjusted now patient has hypokalemia.  Continue IV Lasix, potassium replacement.  6 chronic kidney disease stage III: Creatinine is slightly worse than before.  Watch closely while on diuretics.  Recently was seen by Dr. Rockey Situ on June 30.       All the records are reviewed and case discussed with Care Management/Social Workerr. Management plans discussed with the patient, family and they are in agreement.  CODE STATUS: DNR  TOTAL TIME TAKING CARE OF THIS PATIENT: 35 minutes.    POSSIBLE D/C IN 2-3 DAYS, DEPENDING ON CLINICAL CONDITION.   [name] M.D on 05/02/2019   Between 7am to 6pm - Pager - [phone]  After 6pm go to [email or web address] - password [email or web address]  [facility]  Office  [phone]  CC: Primary care physician; Asencion Noble, MD  Note: This dictation was prepared with Dragon dictation along with smaller phrase technology. Any transcriptional errors that result from this process are unintentional.

## 2019-05-03 DIAGNOSIS — J9602 Acute respiratory failure with hypercapnia: Secondary | ICD-10-CM

## 2019-05-03 DIAGNOSIS — J449 Chronic obstructive pulmonary disease, unspecified: Secondary | ICD-10-CM

## 2019-05-03 DIAGNOSIS — Z7189 Other specified counseling: Secondary | ICD-10-CM

## 2019-05-03 DIAGNOSIS — Z515 Encounter for palliative care: Secondary | ICD-10-CM

## 2019-05-03 LAB — BASIC METABOLIC PANEL
Anion gap: 13 (ref 5–15)
BUN: 24 mg/dL — ABNORMAL HIGH (ref 8–23)
CO2: 28 mmol/L (ref 22–32)
Calcium: 8.8 mg/dL — ABNORMAL LOW (ref 8.9–10.3)
Chloride: 96 mmol/L — ABNORMAL LOW (ref 98–111)
Creatinine, Ser: 1.41 mg/dL — ABNORMAL HIGH (ref 0.61–1.24)
GFR calc Af Amer: 57 mL/min — ABNORMAL LOW (ref >60)
GFR calc non Af Amer: 49 mL/min — ABNORMAL LOW (ref >60)
Glucose, Bld: 133 mg/dL — ABNORMAL HIGH (ref 70–99)
Potassium: 3.2 mmol/L — ABNORMAL LOW (ref 3.5–5.1)
Sodium: 137 mmol/L (ref 135–145)

## 2019-05-03 LAB — GLUCOSE, CAPILLARY: Glucose-Capillary: 110 mg/dL — ABNORMAL HIGH (ref 70–99)

## 2019-05-03 LAB — CBC
HCT: 29.6 % — ABNORMAL LOW (ref 39.0–52.0)
Hemoglobin: 8.8 g/dL — ABNORMAL LOW (ref 13.0–17.0)
MCH: 26.6 pg (ref 26.0–34.0)
MCHC: 29.7 g/dL — ABNORMAL LOW (ref 30.0–36.0)
MCV: 89.4 fL (ref 80.0–100.0)
Platelets: 517 10*3/uL — ABNORMAL HIGH (ref 150–400)
RBC: 3.31 MIL/uL — ABNORMAL LOW (ref 4.22–5.81)
RDW: 15.1 % (ref 11.5–15.5)
WBC: 19.9 10*3/uL — ABNORMAL HIGH (ref 4.0–10.5)
nRBC: 0 % (ref 0.0–0.2)

## 2019-05-03 MED ORDER — ALUM & MAG HYDROXIDE-SIMETH 200-200-20 MG/5ML PO SUSP
30.0000 mL | Freq: Four times a day (QID) | ORAL | Status: DC | PRN
  Administered 2019-05-03: 30 mL via ORAL
  Filled 2019-05-03: qty 30

## 2019-05-03 NOTE — Progress Notes (Signed)
PT Cancellation Note  Patient Details Name: [name] MRN: [id number] DOB: 30-Oct-1945   Cancelled Treatment:    Reason Eval/Treat Not Completed: Other (comment). Per chart review, pt continues to decline, very lethargic. Secure chat with MD. Will hold this date as palliative care is assisting in POC and appropriateness for PT intervention. Will follow up tomorrow.   Erhardt Dada 05/03/2019, 3:30 PM  Greggory Stallion, PT, DPT [phone]

## 2019-05-03 NOTE — Consult Note (Addendum)
Consultation Note Date: 05/03/2019   Patient Name: [name]  DOB: 21-Jun-1946  MRN: [id number]  Age / Sex: 73 y.o., male  PCP: Asencion Noble, MD Referring Physician: [name], *  Reason for Consultation: Establishing goals of care  HPI/Patient Profile: [name]  is a 73 y.o. male with a known history of left lung cancer, history of COPD, MAI, history of diastolic heart failure follows up with Dr. Rockey Situ comes in because of worsening shortness of breath, not able to take few steps without getting short of breath.  Clinical Assessment and Goals of Care: Patient is resting in bed. He lives at home with his wife. He has 1 daughter. He states the health team has spoken with him about hospice. He states he is neurtral about the thought of hospice. He states he wants to be home, and with his family. He envisions end of life being at home with his family, and his biggest fear is that he will not see his family again when he dies. He is a man of [place]. He states that he has done a lot of things in his life he is not proud of and wants to be presentable to God when he dies. He discusses salvation. He states his other fear is that he wants to be sure his wife will be taken care of when he dies.  Spoke with his wife. She states he has been declining steadily and he has lost around 15 pounds. He becomes SOB with any exertion. He attempted abx regimen for MAC 2 years ago, and was hospitalized because of the treatment and was unable to complete it. She wants him to come home, but providing care to him has become increasingly difficult. She states if she has the tools to help him (DME), she would be more comfortable with taking him home. She does not want him to move to a nursing facility as she wants to be able to visit him and spend time with him.   Plans to meet with wife tomorrow morning at bedside. We  discussed hospice facility. Today, his intake has been poor. If it remains poor, will discuss hospice facility placement.     SUMMARY OF RECOMMENDATIONS   Hospice home vs hospice facility   Code Status/Advance Care Planning:  DNR    Prognosis:   < 6 months  Discharge Planning: To Be Determined      Primary Diagnoses: Present on Admission: . Acute respiratory failure with hypoxia ([place])   I have reviewed the medical record, interviewed the patient and family, and examined the patient. The following aspects are pertinent.  Past Medical History:  Diagnosis Date  . Acute MI [facility])    pt denies  . Arthritis of lumbar spine   . Asthma   . Clotting disorder ([place])   . Collapsed lung    x3  . COPD (chronic obstructive pulmonary disease) ([place])   . Dyspnea   . History of benign thyroid tumor   . Lung cancer ([place])   .  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 Pneumonia   . Pneumothorax   . Small cell carcinoma of lung ([place])   . Spontaneous pneumothorax    Social History   Socioeconomic History  . Marital status: Married    Spouse name: Not on file  . Number of children: 1  . Years of education: Not on file  . Highest education level: Not on file  Occupational History  . Occupation: Retired    Comment: Manager of [employer]  . Financial resource strain: Not on file  . Food insecurity    Worry: Not on file    Inability: Not on file  . Transportation needs    Medical: Not on file    Non-medical: Not on file  Tobacco Use  . Smoking status: Former Smoker    Packs/day: 3.00    Years: 30.00    Pack years: 90.00    Types: Cigars    Quit date: 03/02/1992    Years since quitting: 27.1  . Smokeless tobacco: Never Used  . Tobacco comment: quit 22 yrs ago  Substance and Sexual Activity  . Alcohol use: No    Alcohol/week: 0.0 standard drinks  . Drug use: No  . Sexual activity: Yes  Lifestyle  . Physical activity    Days per week: Not on file     Minutes per session: Not on file  . Stress: Not on file  Relationships  . Social Herbalist on phone: Not on file    Gets together: Not on file    Attends religious service: Not on file    Active member of club or organization: Not on file    Attends meetings of clubs or organizations: Not on file    Relationship status: Not on file  Other Topics Concern  . Not on file  Social History Narrative  . Not on file   Family History  Problem Relation Age of Onset  . Heart disease Mother   . Colon cancer Father   . Heart failure Brother    Scheduled Meds: . aspirin EC  81 mg Oral Daily  . atorvastatin  10 mg Oral Daily  . azithromycin  250 mg Oral QODAY  . docusate sodium  100 mg Oral BID  . enoxaparin (LOVENOX) injection  40 mg Subcutaneous Q24H  . feeding supplement (ENSURE ENLIVE)  237 mL Oral BID BM  . furosemide  20 mg Intravenous BID  . ipratropium-albuterol  3 mL Nebulization Q6H  . mometasone-formoterol  2 puff Inhalation BID  . multivitamin with minerals  1 tablet Oral Daily  . potassium chloride  40 mEq Oral BID  . prednisoLONE acetate  1 drop Left Eye BID  . predniSONE  40 mg Oral Q breakfast  . tobramycin (PF)  300 mg Nebulization Daily   Continuous Infusions: . ceFEPime (MAXIPIME) IV 200 mL/hr at 05/03/19 0725   PRN Meds:.acetaminophen **OR** acetaminophen, ALPRAZolam, bisacodyl, ondansetron **OR** ondansetron (ZOFRAN) IV, traZODone Medications Prior to Admission:  Prior to Admission medications   Medication Sig Start Date End Date Taking? Authorizing Provider  albuterol (PROVENTIL HFA;VENTOLIN HFA) 108 (90 Base) MCG/ACT inhaler Inhale 2 puffs into the lungs every 6 (six) hours as needed for wheezing or shortness of breath. 03/02/16  Yes Young, Tarri Fuller D, MD  albuterol (PROVENTIL) (2.5 MG/3ML) 0.083% nebulizer solution Take 3 mLs (2.5 mg total) by nebulization 2 (two) times daily. With chest percussion treatment. May also use every 6 hrs as  needed for  increased shortness of breath, wheezing, chest tightness 11/23/18  Yes Wilhelmina Mcardle, MD  ALPRAZolam Duanne Moron) 0.25 MG tablet Take 0.5-1 tablets (0.125-0.25 mg total) by mouth 2 (two) times daily as needed for anxiety or sleep. Use as needed for anxiety if all other measures fail.  Start with half tablet initially to see how you respond to this medication 02/27/19 02/27/20 Yes Wilhelmina Mcardle, MD  aspirin 81 MG EC tablet Take 81 mg by mouth daily. Swallow whole.   Yes [provider]  atorvastatin (LIPITOR) 10 MG tablet TAKE 1 TABLET BY MOUTH EVERY DAY 06/17/15  Yes Gollan, Kathlene November, MD  Fluticasone-Salmeterol (ADVAIR DISKUS) 100-50 MCG/DOSE AEPB Inhale 1 puff into the lungs 2 (two) times daily.   Yes [provider]  furosemide (LASIX) 20 MG tablet Take 1 tablet (20 mg total) by mouth daily. 04/11/19  Yes Dunn, Areta Haber, PA-C  guaiFENesin (MUCINEX) 600 MG 12 hr tablet Take 600 mg by mouth daily.    Yes [provider]  KLOR-CON M20 20 MEQ tablet Take 1 tablet (20 mEq total) by mouth daily. Patient taking differently: Take 30 mEq by mouth daily.  04/11/19  Yes Dunn, Areta Haber, PA-C  prednisoLONE acetate (PRED FORTE) 1 % ophthalmic suspension Place 1 drop into the left eye 2 (two) times daily.  10/19/17  Yes [provider]  tobramycin, PF, (TOBI) 300 MG/5ML nebulizer solution Take 5 mLs (300 mg total) by nebulization every 12 (twelve) hours. Patient taking differently: Take 300 mg by nebulization daily.  02/12/19  Yes Sainani, Belia Heman, MD  traMADol (ULTRAM) 50 MG tablet Take 1 tablet (50 mg total) by mouth every 6 (six) hours as needed. Takes 2 tablets 4 times daily as needed for pain. 07/29/16  Yes [name], MD   Allergies  Allergen Reactions  . Other Other (See Comments)    Hydromet cough syrup-causes GI upset  . Codeine Nausea And Vomiting   Review of Systems  Constitutional: Positive for fatigue.  Respiratory: Positive for shortness of breath.     Physical  Exam Pulmonary:     Effort: Pulmonary effort is normal.     Comments: Weak cough Skin:    General: Skin is warm and dry.  Neurological:     Mental Status: He is alert.     Vital Signs: BP 116/62 (BP Location: Left Arm)   Pulse 84   Temp (!) 97.4 F (36.3 C) (Oral)   Resp 20   Ht _0  (1.702 m)   Wt 56.8 kg   SpO2 95%   BMI 19.62 kg/m  Pain Scale: 0-10   Pain Score: 0-No pain   SpO2: SpO2: 95 % O2 Device:SpO2: 95 % O2 Flow Rate: .O2 Flow Rate (L/min): 2 L/min  IO: Intake/output summary:   Intake/Output Summary (Last 24 hours) at 05/03/2019 1453 Last data filed at 05/03/2019 1412 Gross per 24 hour  Intake 460 ml  Output 1900 ml  Net -1440 ml    LBM: Last BM Date: 05/02/19 Baseline Weight: Weight: 59 kg Most recent weight: Weight: 56.8 kg     Palliative Assessment/Data: 30%     Time In: 2:00 Time Out: 3:30 Time Total: 90 min Greater than 50%  of this time was spent counseling and coordinating care related to the above assessment and plan.  Signed by: Asencion Gowda, NP   Please contact Palliative Medicine Team phone at [phone] for questions and concerns.  For individual provider: See Shea Evans

## 2019-05-03 NOTE — Progress Notes (Signed)
[facility] [place] NAME: [name]    MR#:  [id number]  DATE OF BIRTH:  March 27, 1946  SUBJECTIVE:  CHIEF COMPLAINT:   Chief Complaint  Patient presents with  . Shortness of Breath  . Congestive Heart Failure   Came with worsening SOB, loss of weight. He had failed MAC treatment a few years ago. Pt is not talking much, wife is in room.  REVIEW OF SYSTEMS:  CONSTITUTIONAL: No fever, have fatigue or weakness.  EYES: No blurred or double vision.  EARS, NOSE, AND THROAT: No tinnitus or ear pain.  RESPIRATORY: Have cough, shortness of breath, no wheezing or hemoptysis.  CARDIOVASCULAR: No chest pain, orthopnea, edema.  GASTROINTESTINAL: No nausea, vomiting, diarrhea or abdominal pain.  GENITOURINARY: No dysuria, hematuria.  ENDOCRINE: No polyuria, nocturia,  HEMATOLOGY: No anemia, easy bruising or bleeding SKIN: No rash or lesion. MUSCULOSKELETAL: No joint pain or arthritis.   NEUROLOGIC: No tingling, numbness, weakness.  PSYCHIATRY: No anxiety or depression.   ROS  DRUG ALLERGIES:   Allergies  Allergen Reactions  . Other Other (See Comments)    Hydromet cough syrup-causes GI upset  . Codeine Nausea And Vomiting    VITALS:  Blood pressure (!) 110/57, pulse 86, temperature (!) 97.4 F (36.3 C), temperature source Oral, resp. rate 19, height _0  (1.702 m), weight 56.8 kg, SpO2 98 %.  PHYSICAL EXAMINATION:   GENERAL:  73 y.o.-year-old thin patient lying in the bed, appears chronically ill. EYES: Pupils equal, round, reactive to light and accommodation. No scleral icterus. Extraocular muscles intact.  HEENT: Head atraumatic, normocephalic. Oropharynx and nasopharynx clear.  NECK:  Supple, no jugular venous distention. No thyroid enlargement, no tenderness.  LUNGS: Patient has bilateral wheezing. CARDIOVASCULAR: S1, S2 normal. No murmurs, rubs, or gallops.  ABDOMEN: Soft, nontender, nondistended. Bowel sounds present. No  organomegaly or mass.  EXTREMITIES: 2+ pedal edema NEUROLOGIC: Cranial nerves II through XII are intact. Muscle strength 3/5 in all extremities. Sensation intact. Gait not checked.  PSYCHIATRIC: The patient is alert and oriented x 2.  SKIN: No obvious rash, lesion, or ulcer.   Physical Exam LABORATORY PANEL:   CBC Recent Labs  Lab 05/03/19 0643  WBC 19.9*  HGB 8.8*  HCT 29.6*  PLT 517*   ------------------------------------------------------------------------------------------------------------------  Chemistries  Recent Labs  Lab 05/01/19 1357 05/02/19 0516 05/03/19 0643  NA 135 137 137  K 3.1* 2.9* 3.2*  CL 94* 100 96*  CO2 _1 GLUCOSE 111* 99 133*  BUN 23 19 24*  CREATININE 1.53* 1.47* 1.41*  CALCIUM 8.7* 8.2* 8.8*  MG  --  2.1  --   AST 16  --   --   ALT 16  --   --   ALKPHOS 70  --   --   BILITOT 0.6  --   --    ------------------------------------------------------------------------------------------------------------------  Cardiac Enzymes No results for input(s): TROPONINI in the last 168 hours. ------------------------------------------------------------------------------------------------------------------  RADIOLOGY:  No results found.  ASSESSMENT AND PLAN:   Active Problems:   Acute respiratory failure with hypoxia ([place])  73 year old male patient with multiple medical problems of diastolic heart failure, essential hypertension, history of previous left lung cancer status post surgery, iron deficiency anemia gets iron shots with Dr. Grayland Ormond comes in because of worsening shortness of breath, fatigue.  1.  Acute respiratory failure with hypoxia, oxygen saturation 80% on room air when he came, now on 4 L of oxygen, patient acute respiratory failure  likely secondary to COPD exacerbation and also right-sided pneumonia, unable to exclude malignancy on the right side Dr.Simonds for pulm consult. No malignancy, it seems to be bronchiectasis  complication.   2 severe bronchiectasis secondary to MAC, intolerant attempt at treating MAC, recurrent exacerbations of bronchiectasis, refractory to systemic antibiotic therapy, patient was on tobramycin inhalers, chest percussion therapy as per Dr. Rosita Fire , patient was admitted in May for similar complaints.  As per Dr. Alva Garnet- Change Abx coverage to cefepime for Pseudomonas and check for MRSA in sputum. ID consulted per pulm. Pt might need long term suppression therapy. It could be oral as per ID- waiting on sputum cx to check for colonization.  3.  History of for left lung cancer status post resection in 2000,   consult pulmonary.  4.  Progressive weakness, unsteadiness.  Physical therapy consult, patient followed by palliative care, CODE STATUS DNR as per discussion with wife. Long term prognosis very poor- Called palliative care consult in hospital.  5.  Diastolic heart failure, patient Lasix dose has been adjusted now patient has hypokalemia.  Continue IV Lasix, potassium replacement.  6 chronic kidney disease stage III: Creatinine is slightly worse than before.  Watch closely while on diuretics.  Recently was seen by Dr. Rockey Situ on June 30.    7. Altered mental status with CO2 retention Pt had AMS yesterday.  Wife decided no further intervention, more alert and stable today.   All the records are reviewed and case discussed with Care Management/Social Workerr. Management plans discussed with the patient, family and they are in agreement.  CODE STATUS: DNR  TOTAL TIME TAKING CARE OF THIS PATIENT: 35 minutes.   POSSIBLE D/C IN 2-3 DAYS, DEPENDING ON CLINICAL CONDITION.   [name] M.D on 05/03/2019   Between 7am to 6pm - Pager - [phone]  After 6pm go to [email or web address] - password [email or web address] [facility]  Office  [phone]  CC: Primary care physician; Asencion Noble, MD  Note: This dictation was prepared with Dragon dictation along with  smaller phrase technology. Any transcriptional errors that result from this process are unintentional.

## 2019-05-03 NOTE — Progress Notes (Signed)
ID Pt more alert today SOB at rest Talking  Patient Vitals for the past 24 hrs:  BP Temp Temp src Pulse Resp SpO2 Weight  05/03/19 1528 (!) 110/57 - - 86 19 98 % -  05/03/19 1005 - - - 84 20 95 % -  05/03/19 0714 116/62 (!) 97.4 F (36.3 C) Oral 80 19 96 % -  05/03/19 0413 118/60 97.6 F (36.4 C) Oral 76 14 100 % -  05/03/19 0243 - - - - - - 56.8 kg  05/02/19 2312 (!) 106/51 - - - - 95 % -  05/02/19 1936 - - - - - 98 % -  05/02/19 1930 (!) 112/46 98 F (36.7 C) Oral 80 20 100 % -    Chest b/l rhonchi HS tachycardia  CBC Latest Ref Rng & Units 05/03/2019 05/02/2019 05/01/2019  WBC 4.0 - 10.5 K/uL 19.9(H) 15.9(H) 18.9(H)  Hemoglobin 13.0 - 17.0 g/dL 8.8(L) 7.4(L) 8.8(L)  Hematocrit 39.0 - 52.0 % 29.6(L) 25.2(L) 29.1(L)  Platelets 150 - 400 K/uL 517(H) 478(H) 533(H)    CMP Latest Ref Rng & Units 05/03/2019 05/02/2019 05/01/2019  Glucose 70 - 99 mg/dL 133(H) 99 111(H)  BUN 8 - 23 mg/dL 24(H) 19 23  Creatinine 0.61 - 1.24 mg/dL 1.41(H) 1.47(H) 1.53(H)  Sodium 135 - 145 mmol/L 137 137 135  Potassium 3.5 - 5.1 mmol/L 3.2(L) 2.9(L) 3.1(L)  Chloride 98 - 111 mmol/L 96(L) 100 94(L)  CO2 22 - 32 mmol/L _0 Calcium 8.9 - 10.3 mg/dL 8.8(L) 8.2(L) 8.7(L)  Total Protein 6.5 - 8.1 g/dL - - 7.0  Total Bilirubin 0.3 - 1.2 mg/dL - - 0.6  Alkaline Phos 38 - 126 U/L - - 70  AST 15 - 41 U/L - - 16  ALT 0 - 44 U/L - - 16    Impression/recommendation Acute hypoxic resp failure due to Bilateral ongoing cavitary lesions in both lungs with underlying COPD/Mycobacterium avium intracellulare infection. Also has underlying carcinoma of the lung status post resection. Had hypercarbia yesterday  Needs sputum cultures to see whether he is colonized with any organism.  Currently on cefepime, azithromycin.  Procalcitonin is only 0.17 so  likely there is not an overwhelming bacterial infection.  Will decide on long term suppressive therapy once culture obtained. Will not need Iv suppressive therapy- may  cycle antibiotics - ? Po quinolone?  Untreated MAC.  Patient did not complete treatment because of severe side effects in 2017.  CKD.  Congestive heart failure  Palliative on board  Discussed the management with patient and Dr.Simonds

## 2019-05-04 DIAGNOSIS — L8990 Pressure ulcer of unspecified site, unspecified stage: Secondary | ICD-10-CM | POA: Insufficient documentation

## 2019-05-04 LAB — BASIC METABOLIC PANEL
Anion gap: 10 (ref 5–15)
BUN: 33 mg/dL — ABNORMAL HIGH (ref 8–23)
CO2: 31 mmol/L (ref 22–32)
Calcium: 8.8 mg/dL — ABNORMAL LOW (ref 8.9–10.3)
Chloride: 92 mmol/L — ABNORMAL LOW (ref 98–111)
Creatinine, Ser: 1.36 mg/dL — ABNORMAL HIGH (ref 0.61–1.24)
GFR calc Af Amer: 60 mL/min — ABNORMAL LOW (ref >60)
GFR calc non Af Amer: 52 mL/min — ABNORMAL LOW (ref >60)
Glucose, Bld: 110 mg/dL — ABNORMAL HIGH (ref 70–99)
Potassium: 3 mmol/L — ABNORMAL LOW (ref 3.5–5.1)
Sodium: 133 mmol/L — ABNORMAL LOW (ref 135–145)

## 2019-05-04 LAB — GLUCOSE, CAPILLARY: Glucose-Capillary: 121 mg/dL — ABNORMAL HIGH (ref 70–99)

## 2019-05-04 MED ORDER — ENOXAPARIN SODIUM 30 MG/0.3ML ~~LOC~~ SOLN
30.0000 mg | SUBCUTANEOUS | Status: DC
  Administered 2019-05-04: 30 mg via SUBCUTANEOUS
  Filled 2019-05-04: qty 0.3

## 2019-05-04 MED ORDER — POTASSIUM CHLORIDE CRYS ER 20 MEQ PO TBCR
20.0000 meq | EXTENDED_RELEASE_TABLET | Freq: Once | ORAL | Status: AC
  Administered 2019-05-04: 20 meq via ORAL
  Filled 2019-05-04: qty 1

## 2019-05-04 NOTE — Progress Notes (Signed)
PT Cancellation Note  Patient Details Name: [name] MRN: [id number] DOB: 16-Jul-1946   Cancelled Treatment:    Reason Eval/Treat Not Completed: Other (comment). Discussed with staff. Pt is not appropriate for PT evaluation at this time, plans for dc with hospice. Will cancel current order. Please re-order if needs change.   Christie Viscomi 05/04/2019, 2:43 PM  Greggory Stallion, PT, DPT [phone]

## 2019-05-04 NOTE — Consult Note (Signed)
[facility] Nurse wound consult note Consultation was completed by review of records and nursing documentation  Reason for Consult: Stage 2 Pressure injury Wound type: per nursing documentation small ulceration on the sacrum: consistent with Stage 2 Pressure Injury  Pressure Injury POA: Yes Per nursing assessment/flowsheet  Measurement: 0.5cm x0.5cm x 0 Wound bed: 100% red  Drainage (amount, consistency, odor) none Periwound: intact  Dressing procedure/placement/frequency: Continue silicone foam per skin care order set. Turn and reposition every 2 hours. Protect skin from bowel incontinence with moisture barrier cream. RD following for nutritional supplementation   Plans for transition to home with hospice care or residential hospice care per palliative care notes 05/03/19   Re consult if needed, will not follow at this time. Thanks  Naoki Migliaccio [employer], RN,CWOCN, CNS, [place] [phone])

## 2019-05-04 NOTE — TOC Initial Note (Addendum)
Transition of Care [facility]) - Initial/Assessment Note    Patient Details  Name: [name] MRN: [id number] Date of Birth: 1945/12/14  Transition of Care [facility]) CM/SW Contact:    Ross Ludwig, LCSW Phone Number: 05/04/2019, 7:37 PM  Clinical Narrative:   Patient is a 73 year old male who is alert and oriented x2.  Patient has some confusion assessment completed by speaking with patient's wife.  Patient's wife has decided that patient should return home with hospice services.  Patient was being followed by outpatient palliative.  Page is familiar with patient.  Patient's wife, stated she thinks she will be able to provide some help in the home with other family members.  CSW was informed by palliative of patient's wife's wishes.  CSW made referral to [facility] nurse liaison Santiago Glad.  Patient's wife would like a hospital bed, bedside commode, oxygen, suction device for saliva, and a wheelchair.  CSW to continue to follow patient's progress throughout discharge planning.                Expected Discharge Plan: Home w Hospice Care Barriers to Discharge: Equipment Delay   Patient Goals and CMS Choice Patient states their goals for this hospitalization and ongoing recovery are:: To return back home with hospice services. CMS Medicare.gov Compare Post Acute Care list provided to:: Patient Represenative (must comment) Choice offered to / list presented to : Spouse  Expected Discharge Plan and Services Expected Discharge Plan: [place] Acute Care Choice: Hospice Living arrangements for the past 2 months: Single Family Home                 DME Arranged: Hospital bed DME Agency: Other - Comment([facility]) Date DME Agency Contacted: 05/04/19 Time DME Agency Contacted: 1440 Representative spoke with at DME Agency: Santiago Glad            Prior Living Arrangements/Services Living arrangements for the past 2 months: [place] Lives  with:: Spouse Patient language and need for interpreter reviewed:: Yes Do you feel safe going back to the place where you live?: Yes      Need for Family Participation in Patient Care: Yes (Comment) Care giver support system in place?: Yes (comment)   Criminal Activity/Legal Involvement Pertinent to Current Situation/Hospitalization: No - Comment as needed  Activities of Daily Living Home Assistive Devices/Equipment: Cane (specify quad or straight) ADL Screening (condition at time of admission) Patient's cognitive ability adequate to safely complete daily activities?: Yes Is the patient deaf or have difficulty hearing?: No Does the patient have difficulty seeing, even when wearing glasses/contacts?: No Does the patient have difficulty concentrating, remembering, or making decisions?: No Patient able to express need for assistance with ADLs?: Yes Does the patient have difficulty dressing or bathing?: No Independently performs ADLs?: Yes (appropriate for developmental age) Does the patient have difficulty walking or climbing stairs?: Yes Weakness of Legs: None Weakness of Arms/Hands: None  Permission Sought/Granted Permission sought to share information with : Facility Sport and exercise psychologist, Family Supports, Case Manager Permission granted to share information with : Yes, Verbal Permission Granted  Share Information with NAME: Keir,[name] Spouse [phone]  [phone]  Permission granted to share info w AGENCY: [facility] agency        Emotional Assessment Appearance:: Appears stated age   Affect (typically observed): Pleasant, Stable Orientation: : Oriented to Self, Oriented to Place Alcohol / Substance Use: Not Applicable Psych Involvement: No (comment)  Admission diagnosis:  Shortness of breath [R06.02] Patient Active Problem List   Diagnosis Date Noted  . Pressure injury of skin 05/04/2019  . Acute respiratory failure with hypoxia ([place]) 05/01/2019  . CAP  (community acquired pneumonia) 02/09/2019  . HCAP (healthcare-associated pneumonia) 02/09/2019  . Atherosclerosis of native coronary artery of native heart with stable angina pectoris ([place]) 04/09/2018  . Iron deficiency anemia due to chronic blood loss 09/15/2016  . Palliative care encounter   . DNR (do not resuscitate) discussion   . Protein-calorie malnutrition, severe 07/24/2016  . Respiratory failure ([place])   . Acute respiratory failure ([place]) 07/22/2016  . Iron deficiency anemia 07/21/2016  . General weakness   . Hyponatremia 07/20/2016  . COPD exacerbation ([place]) 06/28/2016  . 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 Bronchiectasis with acute lower respiratory infection ([place]) 05/20/2016  . Lung disease, bullous ([place]) 05/19/2016  . Peripheral edema 12/30/2014  . Encounter for therapeutic drug monitoring 03/15/2014  . Mediastinal adenopathy 01/26/2014  . Tick bite of axillary region 12/26/2013  . Pulmonary embolus ([place]) 12/22/2013  . PNA (pneumonia) 12/22/2013  . Hyperlipidemia 12/26/2012  . NSTEMI (non-ST elevated myocardial infarction) ([place]) 06/03/2012  . HTN (hypertension) 06/03/2012  . Adenocarcinoma of left lung ([place]) 04/30/2008  . DYSPNEA 11/18/2007  . COPD mixed type ([place]) 11/06/2007  . SPONTANEOUS PNEUMOTHORAX 10/31/2007   PCP:  Asencion Noble, MD Pharmacy:   [id number] [place] [id number]: [phone]: [address]Phone: [phone]: [phone]    Social Determinants of Health (SDOH) Interventions    Readmission Risk Interventions Readmission Risk Prevention Plan 02/12/2019 02/11/2019 02/10/2019  Transportation Screening Complete Complete Complete  PCP or Specialist Appt within 3-5 Days Complete - -  Home Care Screening - - Complete  Medication Review (RN CM) - - Complete  HRI or  Home Care Consult Complete Complete -  Social Work Consult for [place]/Counseling Patient refused - -  Palliative Care Screening Patient Refused Patient Refused -  Medication Review (Press photographer Complete - -  Some recent data might be hidden

## 2019-05-04 NOTE — Care Management Important Message (Signed)
Important Message  Patient Details  Name: [name] MRN: [id number] Date of Birth: [birth year]   Medicare Important Message Given:  Yes     Dannette Barbara 05/04/2019, 3:48 PM

## 2019-05-04 NOTE — Progress Notes (Signed)
[place] [facility] [place] NAME: [name]    MR#:  [id number]  DATE OF BIRTH:  June 05, 1946  SUBJECTIVE:  CHIEF COMPLAINT:   Chief Complaint  Patient presents with  . Shortness of Breath  . Congestive Heart Failure  Feeling very weak and tired and coughing.  Wife at bedside leaning towards hospice care at home awaiting for palliative care to see the patient Came with worsening SOB, loss of weight. He had failed MAC treatment a few years ago.   REVIEW OF SYSTEMS:  CONSTITUTIONAL: No fever, have fatigue or weakness.  EYES: No blurred or double vision.  EARS, NOSE, AND THROAT: No tinnitus or ear pain.  RESPIRATORY: Have cough, shortness of breath, no wheezing or hemoptysis.  CARDIOVASCULAR: No chest pain, orthopnea, edema.  GASTROINTESTINAL: No nausea, vomiting, diarrhea or abdominal pain.  GENITOURINARY: No dysuria, hematuria.  ENDOCRINE: No polyuria, nocturia,  HEMATOLOGY: No anemia, easy bruising or bleeding SKIN: No rash or lesion. MUSCULOSKELETAL: No joint pain or arthritis.   NEUROLOGIC: No tingling, numbness, weakness.  PSYCHIATRY: No anxiety or depression.   ROS  DRUG ALLERGIES:   Allergies  Allergen Reactions  . Other Other (See Comments)    Hydromet cough syrup-causes GI upset  . Codeine Nausea And Vomiting    VITALS:  Blood pressure 117/60, pulse 77, temperature 97.8 F (36.6 C), resp. rate 18, height _0  (1.702 m), weight 57.5 kg, SpO2 96 %.  PHYSICAL EXAMINATION:   GENERAL:  73 y.o.-year-old thin patient lying in the bed, appears chronically ill. EYES: Pupils equal, round, reactive to light and accommodation. No scleral icterus. Extraocular muscles intact.  HEENT: Head atraumatic, normocephalic. Oropharynx and nasopharynx clear.  NECK:  Supple, no jugular venous distention. No thyroid enlargement, no tenderness.  LUNGS: Patient has bilateral wheezing. CARDIOVASCULAR: S1, S2 normal. No murmurs, rubs, or gallops.   ABDOMEN: Soft, nontender, nondistended. Bowel sounds present.  EXTREMITIES: 2+ pedal edema NEUROLOGIC: Cranial nerves II through XII are intact. Muscle strength 3/5 in all extremities. Sensation intact. Gait not checked.  PSYCHIATRIC: The patient is alert and oriented x 2.  SKIN: No obvious rash, lesion, or ulcer.   Physical Exam LABORATORY PANEL:   CBC Recent Labs  Lab 05/03/19 0643  WBC 19.9*  HGB 8.8*  HCT 29.6*  PLT 517*   ------------------------------------------------------------------------------------------------------------------  Chemistries  Recent Labs  Lab 05/01/19 1357 05/02/19 0516  05/04/19 0527  NA 135 137   < > 133*  K 3.1* 2.9*   < > 3.0*  CL 94* 100   < > 92*  CO2 26 29   < > 31  GLUCOSE 111* 99   < > 110*  BUN 23 19   < > 33*  CREATININE 1.53* 1.47*   < > 1.36*  CALCIUM 8.7* 8.2*   < > 8.8*  MG  --  2.1  --   --   AST 16  --   --   --   ALT 16  --   --   --   ALKPHOS 70  --   --   --   BILITOT 0.6  --   --   --    < > = values in this interval not displayed.   ------------------------------------------------------------------------------------------------------------------  Cardiac Enzymes No results for input(s): TROPONINI in the last 168 hours. ------------------------------------------------------------------------------------------------------------------  RADIOLOGY:  No results found.  ASSESSMENT AND PLAN:   Active Problems:   Acute respiratory failure with hypoxia (HCC)  Pressure injury of skin  73 year old male patient with multiple medical problems of diastolic heart failure, essential hypertension, history of previous left lung cancer status post surgery, iron deficiency anemia gets iron shots with Dr. Grayland Ormond comes in because of worsening shortness of breath, fatigue.  1.  Acute respiratory failure with hypoxia, COPD exacerbation, right-sided pneumonia with underlying severe bronchiectasis secondary to MAC  oxygen  saturation 80% on room air at the time of arrival to the hospital, now on 4 L of oxygen, patient acute respiratory failure likely secondary to COPD exacerbation and also right-sided pneumonia, unable to exclude malignancy on the right side  Pulmonology and infectious diseases following   2 severe bronchiectasis secondary to MAC, intolerant attempt at treating MAC, recurrent exacerbations of bronchiectasis, refractory to systemic antibiotic therapy, patient was on tobramycin inhalers, chest percussion therapy as per Dr. Rosita Fire , patient was admitted in May for similar complaints.  As per Dr. Alva Garnet- Change Abx coverage to cefepime for Pseudomonas and check for MRSA in sputum. ID consulted per pulm. Pt might need long term suppression therapy. It could be oral as per ID- waiting on sputum cx to check for colonization.  3.  History of for left lung cancer status post resection in 2000,   consult pulmonary.  4.  Adult failure to thrive with Progressive weakness, clinically deteriorating Physical therapy consult, patient followed by palliative care, CODE STATUS DNR as per discussion with wife. Long term prognosis very poor-patient and wife discussed with palliative care and considering home with hospice care  5.  Diastolic heart failure, patient Lasix dose has been adjusted now patient has hypokalemia.    Will change IV to p.o. Lasix  6 chronic kidney disease stage III: Creatinine is slightly worse than before.  Watch closely while on diuretics.  Recently was seen by Dr. Rockey Situ on June 30.    7. Altered mental status with hypercapnia  Clinically improved patient is mentating fine seems to be at his baseline   Disposition Home with hospice care  All the records are reviewed and case discussed with Care Management/Social Workerr. Management plans discussed with the patient, wife at bedside and they are in agreement.  CODE STATUS: DNR  TOTAL TIME TAKING CARE OF THIS PATIENT: 35 minutes.    POSSIBLE D/C IN 1 DAYS, DEPENDING ON CLINICAL CONDITION.   Nicholes Mango M.D on 05/04/2019   Between 7am to 6pm - Pager - [phone]  After 6pm go to [email or web address] - password [email or web address]  Office  [phone]  CC: Primary care physician; Asencion Noble, MD  Note: This dictation was prepared with Dragon dictation along with smaller phrase technology. Any transcriptional errors that result from this process are unintentional.

## 2019-05-04 NOTE — Consult Note (Signed)
PHARMACY CONSULT NOTE - FOLLOW UP  Pharmacy Consult for Electrolyte Monitoring and Replacement   Recent Labs: Potassium (mmol/L)  Date Value  05/04/2019 3.0 (L)  12/12/2013 4.2   Magnesium (mg/dL)  Date Value  05/02/2019 2.1   Calcium (mg/dL)  Date Value  05/04/2019 8.8 (L)   Calcium, Total (mg/dL)  Date Value  12/12/2013 9.6   Albumin (g/dL)  Date Value  05/01/2019 2.9 (L)  03/28/2019 3.5 (L)  05/09/2012 3.9   Phosphorus (mg/dL)  Date Value  06/28/2016 3.7   Sodium (mmol/L)  Date Value  05/04/2019 133 (L)  03/28/2019 134  12/12/2013 135 (L)   Corrected Ca: 9.68 Scr 1.47 >> 1.41 >> 1.36   Assessment: Patient has been hypokalemic and on scheduled KCL PO 40 mEq BID since 05/02/2019. Last dose of KCL 40 mEq was today @ 0904 - no other replacements given today. Patient is also taking furosemide IV 20 mg Q12H. Due to renal function, will cautiously replace electrolytes.   Patient's renal function is improving.   Goal of Therapy:  Electrolytes WNL.   Plan:  Will order an additional dose of KCL 20 mEq x1 for 1200 today.  Will recheck potassium @ 1600. Pending potassium level, patient may or may not need KCL 40 mEq that is due at 2200.   Will follow electrolytes with AM labs.   Rowland Lathe ,PharmD Clinical Pharmacist 05/04/2019 9:57 AM

## 2019-05-04 NOTE — Progress Notes (Signed)
New referral for [place] [place] hospice services at home received from [place] following a Palliative Medicine follow up visit. Patient is 73 year old man with a known history of left lung cancer, COPD, MAC, Diastolic heart failure, and arthritis admitted to [facility] on 8/3 for treatment of acute on chronic respiratory failure and severe bronchiectasis. He has continued to decline despite medical interventions. Palliative medicine was consulted for goals of care and have been following. Patient and his wife have chosen to focus on comfort with the support of hospice services at home. Writer spoke vis telephone to patient's wife [name] to initiate education regarding hospice services, philosophy[phy and team approach to care with understanding voiced. Writer also made Mrs. Cooperman aware that at this time due to COVID 19 hospice is unable to offer in person volunteer visits and that virtual visit are available on request. DME need discussed. Patient will need a hospital bed, oxygen, over bed table and suction. Plan is for delivery tomorrow with discharge home via EMS when DME is in place. Hospital care team updated. Signed DNR to accompany patient at discharge. Will continue to follow through discharge. Flo Shanks BSN, RN, [facility] [phone]

## 2019-05-04 NOTE — Consult Note (Signed)
Pharmacy Antibiotic Note  [name] is a 73 y.o. male admitted on 05/01/2019 with Exacerbation of bronchiectasis.  Pharmacy has been consulted for Cefepime dosing.  D#3: Cefepime  Plan: Cefepime 2 g Q12H  Height: 5\' 7"  (170.2 cm) Weight: 126 lb 12.8 oz (57.5 kg) IBW/kg (Calculated) : 66.1  Temp (24hrs), Avg:98 F (36.7 C), Min:97.8 F (36.6 C), Max:98.2 F (36.8 C)  Recent Labs  Lab 05/01/19 1357 05/02/19 0516 05/03/19 0643 05/04/19 0527  WBC 18.9* 15.9* 19.9*  --   CREATININE 1.53* 1.47* 1.41* 1.36*    Estimated Creatinine Clearance: 39.9 mL/min (A) (by C-G formula based on SCr of 1.36 mg/dL (H)).    Allergies  Allergen Reactions  . Other Other (See Comments)    Hydromet cough syrup-causes GI upset  . Codeine Nausea And Vomiting    Antimicrobials this admission: 8/3 CTX x1 dose 8/3 Azithromycin >>  8/4 Cefepime >>   Thank you for allowing pharmacy to be a part of this patient's care.  Rowland Lathe 05/04/2019 8:51 AM

## 2019-05-04 NOTE — Progress Notes (Addendum)
Daily Progress Note   Patient Name: [name]       Date: 05/04/2019 DOB: August 21, 1946  Age: 73 y.o. MRN#: [id number] Attending Physician: Nicholes Mango, MD Primary Care Physician: Asencion Noble, MD Admit Date: 05/01/2019  Reason for Consultation/Follow-up: Establishing goals of care  Subjective: Patient is resting in bed. Wife is at bedside. We discussed his diagnosis, prognosis, GOC, EOL wishes disposition and options. Discussed limitations of medical interventions to prolong quality of life in some situations and discussed the concept of human mortality.    Patient states he is ready to go home. Wife states she is worried about taking him home as she will have to help him as worries about assistance he may need, but does not want him going to a nursing facility. He has been eating and drinking a bit more than bites and sips. He removed his O2 during the meeting, nurse checked O2 sat which remained in the low 90's.   A detailed discussion was had today regarding advanced directives.  Concepts specific to code status, artifical feeding and hydration, IV antibiotics and rehospitalization were discussed.  The difference between an aggressive medical intervention path and a comfort care path was discussed.  Values and goals of care important to patient and family were attempted to be elicited.  Options discussed. Patient wants to go home to spend time with his family for what time he has left. Wife states she needs time to get things ready at home and would like for him to come home tomorrow. He would like to continue antibiotics until D/C but does not want further labs or diagnostics.  I completed a MOST form today and the signed original was placed in the chart. A photocopy was placed in the chart to be  scanned into EMR. The patient outlined their wishes for the following treatment decisions:  Cardiopulmonary Resuscitation: Do Not Attempt Resuscitation (DNR/No CPR)  Medical Interventions: Comfort Measures: Keep clean, warm, and dry. Use medication by any route, positioning, wound care, and other measures to relieve pain and suffering. Use oxygen, suction and manual treatment of airway obstruction as needed for comfort. Do not transfer to the hospital unless comfort needs cannot be met in current location.  Antibiotics: Determine use of limitation of antibiotics when infection occurs  IV Fluids: No IV fluids (  provide other measures to ensure comfort)  Feeding Tube: No feeding tube    Length of Stay: 3  Current Medications: Scheduled Meds:   aspirin EC  81 mg Oral Daily   atorvastatin  10 mg Oral Daily   azithromycin  250 mg Oral QODAY   docusate sodium  100 mg Oral BID   enoxaparin (LOVENOX) injection  40 mg Subcutaneous Q24H   feeding supplement (ENSURE ENLIVE)  237 mL Oral BID BM   furosemide  20 mg Intravenous BID   ipratropium-albuterol  3 mL Nebulization Q6H   mometasone-formoterol  2 puff Inhalation BID   multivitamin with minerals  1 tablet Oral Daily   potassium chloride  40 mEq Oral BID   prednisoLONE acetate  1 drop Left Eye BID   predniSONE  40 mg Oral Q breakfast   tobramycin (PF)  300 mg Nebulization Daily    Continuous Infusions:  ceFEPime (MAXIPIME) IV 2 g (05/03/19 2153)    PRN Meds: acetaminophen **OR** acetaminophen, ALPRAZolam, alum & mag hydroxide-simeth, bisacodyl, ondansetron **OR** ondansetron (ZOFRAN) IV, traZODone  Physical Exam Pulmonary:     Effort: Pulmonary effort is normal.     Comments: Productive cough.  Skin:    General: Skin is warm and dry.  Neurological:     Mental Status: He is alert.             Vital Signs: BP 117/60 (BP Location: Right Arm)    Pulse 77    Temp 97.8 F (36.6 C)    Resp 18    Ht 5' 7" (1.702 m)    Wt  57.5 kg    SpO2 96%    BMI 19.86 kg/m  SpO2: SpO2: 96 % O2 Device: O2 Device: Nasal Cannula O2 Flow Rate: O2 Flow Rate (L/min): 2 L/min  Intake/output summary:   Intake/Output Summary (Last 24 hours) at 05/04/2019 1156 Last data filed at 05/04/2019 1113 Gross per 24 hour  Intake 600 ml  Output 2200 ml  Net -1600 ml   LBM: Last BM Date: 05/03/19 Baseline Weight: Weight: 59 kg Most recent weight: Weight: 57.5 kg       Palliative Assessment/Data: 30%      Patient Active Problem List   Diagnosis Date Noted   Pressure injury of skin 05/04/2019   Acute respiratory failure with hypoxia (HCC) 05/01/2019   CAP (community acquired pneumonia) 02/09/2019   HCAP (healthcare-associated pneumonia) 02/09/2019   Atherosclerosis of native coronary artery of native heart with stable angina pectoris ([place]) 04/09/2018   Iron deficiency anemia due to chronic blood loss 09/15/2016   Palliative care encounter    DNR (do not resuscitate) discussion    Protein-calorie malnutrition, severe 07/24/2016   Respiratory failure ([place])    Acute respiratory failure ([place]) 07/22/2016   Iron deficiency anemia 07/21/2016   General weakness    Hyponatremia 07/20/2016   COPD exacerbation ([place]) 06/28/2016   Mycobacterium avium complex ([place]) 06/24/2016   Bronchiectasis with acute lower respiratory infection ([place]) 05/20/2016   Lung disease, bullous ([place]) 05/19/2016   Peripheral edema 12/30/2014   Encounter for therapeutic drug monitoring 03/15/2014   Mediastinal adenopathy 01/26/2014   Tick bite of axillary region 12/26/2013   Pulmonary embolus ([place]) 12/22/2013   PNA (pneumonia) 12/22/2013   Hyperlipidemia 12/26/2012   NSTEMI (non-ST elevated myocardial infarction) ([place]) 06/03/2012   HTN (hypertension) 06/03/2012   Adenocarcinoma of left lung ([place]) 04/30/2008   DYSPNEA 11/18/2007   COPD mixed type ([place]) 11/06/2007   SPONTANEOUS PNEUMOTHORAX 10/31/2007  Palliative Care  Assessment & Plan    Recommendations/Plan:  Home with hospice    Code Status:    Code Status Orders  (From admission, onward)         Start     Ordered   05/01/19 1840  Do not attempt resuscitation (DNR)  Continuous    Question Answer Comment  In the event of cardiac or respiratory ARREST Do not call a code blue   In the event of cardiac or respiratory ARREST Do not perform Intubation, CPR, defibrillation or ACLS   In the event of cardiac or respiratory ARREST Use medication by any route, position, wound care, and other measures to relive pain and suffering. May use oxygen, suction and manual treatment of airway obstruction as needed for comfort.   Comments nmp      05/01/19 1840        Code Status History    Date Active Date Inactive Code Status Order ID Comments User Context   05/01/2019 1840 05/01/2019 1840 DNR [id number]  Epifanio Lesches, MD ED   02/09/2019 0502 02/12/2019 1608 Full Code [id number]  Harrie Foreman, MD ED   01/15/2019 0819 01/15/2019 1333 Full Code [id number]  [name], MD ED   07/20/2016 1544 07/29/2016 2013 Full Code [id number]  Henreitta Leber, MD Inpatient   06/28/2016 2237 06/30/2016 1627 Full Code [id number]  Hugelmeyer, Ubaldo Glassing, DO Inpatient   Advance Care Planning Activity       Prognosis:   < 6 months  MAC. O2. Decline. Poor oral intake.   Discharge Planning:  Home with Hospice  Care plan was discussed with primary MD, SW.   Thank you for allowing the Palliative Medicine Team to assist in the care of this patient.   Time In: 9:40 Time Out: 11:50 Total Time 70 min Prolonged Time Billed  yes      Greater than 50%  of this time was spent counseling and coordinating care related to the above assessment and plan.  Asencion Gowda, NP  Please contact Palliative Medicine Team phone at [phone] for questions and concerns.

## 2019-05-05 LAB — BASIC METABOLIC PANEL
Anion gap: 9 (ref 5–15)
BUN: 35 mg/dL — ABNORMAL HIGH (ref 8–23)
CO2: 35 mmol/L — ABNORMAL HIGH (ref 22–32)
Calcium: 9 mg/dL (ref 8.9–10.3)
Chloride: 93 mmol/L — ABNORMAL LOW (ref 98–111)
Creatinine, Ser: 1.43 mg/dL — ABNORMAL HIGH (ref 0.61–1.24)
GFR calc Af Amer: 56 mL/min — ABNORMAL LOW (ref >60)
GFR calc non Af Amer: 49 mL/min — ABNORMAL LOW (ref >60)
Glucose, Bld: 103 mg/dL — ABNORMAL HIGH (ref 70–99)
Potassium: 3.2 mmol/L — ABNORMAL LOW (ref 3.5–5.1)
Sodium: 137 mmol/L (ref 135–145)

## 2019-05-05 LAB — GLUCOSE, CAPILLARY: Glucose-Capillary: 102 mg/dL — ABNORMAL HIGH (ref 70–99)

## 2019-05-05 MED ORDER — ACETAMINOPHEN 325 MG PO TABS: 650.0000 mg | ORAL_TABLET | Freq: Four times a day (QID) | ORAL | Status: AC | PRN

## 2019-05-05 MED ORDER — DOCUSATE SODIUM 100 MG PO CAPS: 100.0000 mg | ORAL_CAPSULE | Freq: Two times a day (BID) | ORAL | 0 refills | Status: AC

## 2019-05-05 MED ORDER — PREDNISONE 20 MG PO TABS: 20.0000 mg | ORAL_TABLET | Freq: Every day | ORAL | 0 refills | Status: AC

## 2019-05-05 MED ORDER — TRAMADOL HCL 50 MG PO TABS: 50.0000 mg | ORAL_TABLET | Freq: Four times a day (QID) | ORAL | 0 refills | Status: AC | PRN

## 2019-05-05 NOTE — Progress Notes (Signed)
ID Pt is going home with hospice care So no oral antibiotics needed- spoke to his wife who concurs that it causes more side effects like diarrhea.

## 2019-05-05 NOTE — Progress Notes (Signed)
Patient transported via EMS, tele box removed, IV removed, condom cath left in place per wife request. Wife notified of patient transfer. No concerns at this time.

## 2019-05-05 NOTE — Consult Note (Signed)
Pharmacy Antibiotic Note  [name] is a 73 y.o. male admitted on 05/01/2019 with Exacerbation of bronchiectasis.  Pharmacy has been consulted for Cefepime dosing.  D#4: Cefepime  Plan: Cefepime 2 g Q12H  Height: 5\' 7"  (170.2 cm) Weight: 126 lb 12.8 oz (57.5 kg) IBW/kg (Calculated) : 66.1  Temp (24hrs), Avg:97.9 F (36.6 C), Min:97.7 F (36.5 C), Max:98.2 F (36.8 C)  Recent Labs  Lab 05/01/19 1357 05/02/19 0516 05/03/19 0643 05/04/19 0527 05/05/19 0624  WBC 18.9* 15.9* 19.9*  --   --   CREATININE 1.53* 1.47* 1.41* 1.36* 1.43*    Estimated Creatinine Clearance: 38 mL/min (A) (by C-G formula based on SCr of 1.43 mg/dL (H)).    Allergies  Allergen Reactions  . Other Other (See Comments)    Hydromet cough syrup-causes GI upset  . Codeine Nausea And Vomiting    Antimicrobials this admission: 8/3 CTX x1 dose 8/3 Azithromycin >>  8/4 Cefepime >>   Thank you for allowing pharmacy to be a part of this patient's care.  Rowland Lathe 05/05/2019 7:17 AM

## 2019-05-05 NOTE — Discharge Summary (Signed)
[facility] NAME: [name]    MR#:  [id number]  DATE OF BIRTH:  1946-07-07  DATE OF ADMISSION:  05/01/2019 ADMITTING PHYSICIAN: Epifanio Lesches, MD  DATE OF DISCHARGE: 05/05/19   PRIMARY CARE PHYSICIAN: Asencion Noble, MD    ADMISSION DIAGNOSIS:  Shortness of breath [R06.02]  DISCHARGE DIAGNOSIS:  Active Problems:   Acute respiratory failure with hypoxia (HCC)   Pressure injury of skin MAC infection  SECONDARY DIAGNOSIS:   Past Medical History:  Diagnosis Date  . Acute MI [facility])    pt denies  . Arthritis of lumbar spine   . Asthma   . Clotting disorder ([place])   . Collapsed lung    x3  . COPD (chronic obstructive pulmonary disease) ([place])   . Dyspnea   . History of benign thyroid tumor   . Lung cancer ([place])   . Mycobacterium avium complex ([place]) 06/24/2016  . Pneumonia   . Pneumothorax   . Small cell carcinoma of lung ([place])   . Spontaneous pneumothorax     HOSPITAL COURSE:    1. Acute respiratory failure with hypoxia, COPD exacerbation, right-sided pneumonia with underlying severe bronchiectasis secondary to MAC  oxygen saturation 80% on room air at the time of arrival to the hospital, now on 4 L of oxygen, patient acute respiratory failure likely secondary to COPD exacerbation and also right-sided pneumonia, unable to exclude malignancy on the right side  Pulmonology and infectious diseases followed during the hospital course -Seen by palliative care on May 04, 2019.  Agreeable with home with hospice care and patient is refusing more labs and blood work.  Want to continue antibiotics while he is in the hospital only  2 severe bronchiectasis secondary to MAC, intolerant attempt at treating MAC, recurrent exacerbations of bronchiectasis, refractory to systemic antibiotic therapy, patient was on tobramycin inhalers, chest percussion therapy as per Dr. Rosita Fire , patient was admitted in May for similar  complaints.  As per Dr. Alva Garnet- Change Abx coverage to cefepime for Pseudomonas and check for MRSA in sputum. ID consulted per pulm. Pt might need long term suppression therapy. It could be oral as per ID- waiting on sputum cx to check for colonization. Patient and wife has decided to go home with hospice care after talking to the palliative care  3. History of for left lung cancer status post resection in 2000,     4.  Adult failure to thrive withProgressive weakness, clinically deterioratingPhysical therapy consult, patient followed by palliative care, CODE STATUS DNR as per discussion with wife. Long term prognosis very poor-patient and wife discussed with palliative care and considering home with hospice care  5. Diastolic heart failure, patient Lasix dose has been adjusted now patient has hypokalemia.   p.o. Lasix  6 chronic kidney disease stage III: Creatinine is slightly worse than before. Recently was seen by Dr. Rockey Situ on June 30.   7. Altered mental status with hypercapnia  Clinically improved patient is mentating fine seems to be at his baseline   Disposition Home with hospice care DISCHARGE CONDITIONS:   Guarded   CONSULTS OBTAINED:     PROCEDURES   DRUG ALLERGIES:   Allergies  Allergen Reactions  . Other Other (See Comments)    Hydromet cough syrup-causes GI upset  . Codeine Nausea And Vomiting    DISCHARGE MEDICATIONS:   Allergies as of 05/05/2019      Reactions   Other Other (See Comments)   Hydromet  cough syrup-causes GI upset   Codeine Nausea And Vomiting      Medication List    STOP taking these medications   atorvastatin 10 MG tablet Commonly known as: LIPITOR   furosemide 20 MG tablet Commonly known as: LASIX   Klor-Con M20 20 MEQ tablet Generic drug: potassium chloride SA   tobramycin (PF) 300 MG/5ML nebulizer solution Commonly known as: TOBI     TAKE these medications   acetaminophen 325 MG tablet Commonly known as:  TYLENOL Take 2 tablets (650 mg total) by mouth every 6 (six) hours as needed for mild pain (or Fever >/= 101).   Advair Diskus 100-50 MCG/DOSE Aepb Generic drug: Fluticasone-Salmeterol Inhale 1 puff into the lungs 2 (two) times daily.   albuterol 108 (90 Base) MCG/ACT inhaler Commonly known as: VENTOLIN HFA Inhale 2 puffs into the lungs every 6 (six) hours as needed for wheezing or shortness of breath.   albuterol (2.5 MG/3ML) 0.083% nebulizer solution Commonly known as: PROVENTIL Take 3 mLs (2.5 mg total) by nebulization 2 (two) times daily. With chest percussion treatment. May also use every 6 hrs as needed for increased shortness of breath, wheezing, chest tightness   ALPRAZolam 0.25 MG tablet Commonly known as: Xanax Take 0.5-1 tablets (0.125-0.25 mg total) by mouth 2 (two) times daily as needed for anxiety or sleep. Use as needed for anxiety if all other measures fail.  Start with half tablet initially to see how you respond to this medication   aspirin 81 MG EC tablet Take 81 mg by mouth daily. Swallow whole.   docusate sodium 100 MG capsule Commonly known as: COLACE Take 1 capsule (100 mg total) by mouth 2 (two) times daily.   guaiFENesin 600 MG 12 hr tablet Commonly known as: MUCINEX Take 600 mg by mouth daily.   prednisoLONE acetate 1 % ophthalmic suspension Commonly known as: PRED FORTE Place 1 drop into the left eye 2 (two) times daily.   predniSONE 20 MG tablet Commonly known as: DELTASONE Take 1 tablet (20 mg total) by mouth daily with breakfast. Start taking on: May 06, 2019   traMADol 50 MG tablet Commonly known as: ULTRAM Take 1 tablet (50 mg total) by mouth every 6 (six) hours as needed. Takes 2 tablets 4 times daily as needed for pain.        DISCHARGE INSTRUCTIONS:   Hospice care at home  DIET:  Regular diet  DISCHARGE CONDITION:  Fair  ACTIVITY:  Activity as tolerated  OXYGEN:  Home Oxygen: Yes.     Oxygen Delivery: 2 liters/min via  Patient connected to nasal cannula oxygen  DISCHARGE LOCATION:  home   If you experience worsening of your admission symptoms, develop shortness of breath, life threatening emergency, suicidal or homicidal thoughts you must seek medical attention immediately by calling 911 or calling your MD immediately  if symptoms less severe.  You Must read complete instructions/literature along with all the possible adverse reactions/side effects for all the Medicines you take and that have been prescribed to you. Take any new Medicines after you have completely understood and accpet all the possible adverse reactions/side effects.   Please note  You were cared for by a hospitalist during your hospital stay. If you have any questions about your discharge medications or the care you received while you were in the hospital after you are discharged, you can call the unit and asked to speak with the hospitalist on call if the hospitalist that took care of you is  not available. Once you are discharged, your primary care physician will handle any further medical issues. Please note that NO REFILLS for any discharge medications will be authorized once you are discharged, as it is imperative that you return to your primary care physician (or establish a relationship with a primary care physician if you do not have one) for your aftercare needs so that they can reassess your need for medications and monitor your lab values.     Today  Chief Complaint  Patient presents with  . Shortness of Breath  . Congestive Heart Failure   Patient is short of breath and coughing  ROS: Limited CONSTITUTIONAL: Denies fevers, chills.  Positive fatigue and weakness Respiratory shortness of breath at rest   VITAL SIGNS:  Blood pressure 114/60, pulse 81, temperature 97.6 F (36.4 C), temperature source Oral, resp. rate 20, height _0  (1.702 m), weight 57.5 kg, SpO2 100 %.  I/O:    Intake/Output Summary (Last 24 hours) at  05/05/2019 1354 Last data filed at 05/05/2019 1207 Gross per 24 hour  Intake 600 ml  Output 1800 ml  Net -1200 ml    PHYSICAL EXAMINATION:  GENERAL:  73 y.o.-year-old patient lying in the bed with no acute distress.  Emaciated EYES: Pupils equal, round, reactive to light and accommodation. No scleral icterus. Extraocular muscles intact.  HEENT: Head atraumatic, normocephalic. Oropharynx and nasopharynx clear.  NECK:  Supple, no jugular venous distention.  LUNGS: Diminished breath sounds bilaterally, no wheezing, rales,rhonchi or crepitation. No use of accessory muscles of respiration.  CARDIOVASCULAR: S1, S2 normal. No murmurs, rubs, or gallops.  ABDOMEN: Soft, non-tender, non-distended. Bowel sou EXTREMITIES: No pedal edema, cyanosis, or clubbing.  NEUROLOGIC: Awake and alert oriented x2 sensation intact. Gait not checked.  PSYCHIATRIC: The patient is alert and oriented x 2.  SKIN: No obvious rash, lesion, or ulcer.   DATA REVIEW:   CBC Recent Labs  Lab 05/03/19 0643  WBC 19.9*  HGB 8.8*  HCT 29.6*  PLT 517*    Chemistries  Recent Labs  Lab 05/01/19 1357 05/02/19 0516  05/05/19 0624  NA 135 137   < > 137  K 3.1* 2.9*   < > 3.2*  CL 94* 100   < > 93*  CO2 26 29   < > 35*  GLUCOSE 111* 99   < > 103*  BUN 23 19   < > 35*  CREATININE 1.53* 1.47*   < > 1.43*  CALCIUM 8.7* 8.2*   < > 9.0  MG  --  2.1  --   --   AST 16  --   --   --   ALT 16  --   --   --   ALKPHOS 70  --   --   --   BILITOT 0.6  --   --   --    < > = values in this interval not displayed.    Cardiac Enzymes No results for input(s): TROPONINI in the last 168 hours.  Microbiology Results  Results for orders placed or performed during the hospital encounter of 05/01/19  SARS Coronavirus 2 [facility] order, Performed in [facility] hospital lab) Nasopharyngeal Nasopharyngeal Swab     Status: None   Collection Time: 05/01/19  2:10 PM   Specimen: Nasopharyngeal Swab  Result Value Ref Range Status    SARS Coronavirus 2 NEGATIVE NEGATIVE Final    Comment: (NOTE) If result is NEGATIVE SARS-CoV-2 target nucleic acids are NOT DETECTED. The  SARS-CoV-2 RNA is generally detectable in upper and lower  respiratory specimens during the acute phase of infection. The lowest  concentration of SARS-CoV-2 viral copies this assay can detect is 250  copies / mL. A negative result does not preclude SARS-CoV-2 infection  and should not be used as the sole basis for treatment or other  patient management decisions.  A negative result may occur with  improper specimen collection / handling, submission of specimen other  than nasopharyngeal swab, presence of viral mutation(s) within the  areas targeted by this assay, and inadequate number of viral copies  (<250 copies / mL). A negative result must be combined with clinical  observations, patient history, and epidemiological information. If result is POSITIVE SARS-CoV-2 target nucleic acids are DETECTED. The SARS-CoV-2 RNA is generally detectable in upper and lower  respiratory specimens dur ing the acute phase of infection.  Positive  results are indicative of active infection with SARS-CoV-2.  Clinical  correlation with patient history and other diagnostic information is  necessary to determine patient infection status.  Positive results do  not rule out bacterial infection or co-infection with other viruses. If result is PRESUMPTIVE POSTIVE SARS-CoV-2 nucleic acids MAY BE PRESENT.   A presumptive positive result was obtained on the submitted specimen  and confirmed on repeat testing.  While 2019 novel coronavirus  (SARS-CoV-2) nucleic acids may be present in the submitted sample  additional confirmatory testing may be necessary for epidemiological  and / or clinical management purposes  to differentiate between  SARS-CoV-2 and other Sarbecovirus currently known to infect humans.  If clinically indicated additional testing with an alternate test   methodology [phone]) is advised. The SARS-CoV-2 RNA is generally  detectable in upper and lower respiratory sp ecimens during the acute  phase of infection. The expected result is Negative. Fact Sheet for Patients:  StrictlyIdeas.no Fact Sheet for Healthcare Providers: BankingDealers.co.za This test is not yet approved or cleared by the [place] FDA and has been authorized for detection and/or diagnosis of SARS-CoV-2 by FDA under an Emergency Use Authorization (EUA).  This EUA will remain in effect (meaning this test can be used) for the duration of the COVID-19 declaration under Section 564(b)(1) of the Act, 21 U.S.C. section 360bbb-3(b)(1), unless the authorization is terminated or revoked sooner. Performed at [facility], [place], [place] [address]     RADIOLOGY:  Ct Angio Chest Pe W/cm &/or Wo Cm  Result Date: 05/01/2019 CLINICAL DATA:  Shortness of breath, productive cough EXAM: CT ANGIOGRAPHY CHEST WITH CONTRAST TECHNIQUE: Multidetector CT imaging of the chest was performed using the standard protocol during bolus administration of intravenous contrast. Multiplanar CT image reconstructions and MIPs were obtained to evaluate the vascular anatomy. CONTRAST:  51m OMNIPAQUE IOHEXOL 350 MG/ML SOLN COMPARISON:  Same day chest radiograph, CT chest, 06/28/2016 FINDINGS: Cardiovascular: Satisfactory opacification of the pulmonary arteries to the segmental level. No evidence of pulmonary embolism. Normal heart size. Scattered left coronary artery calcifications. No pericardial effusion. Aortic atherosclerosis. Mediastinum/Nodes: No enlarged mediastinal, hilar, or axillary lymph nodes. Thyroid gland, trachea, and esophagus demonstrate no significant findings. Lungs/Pleura: Redemonstrated postoperative findings of left upper lobectomy. There is extensive, nodular and cavitary airspace disease of the remaining left lower  lobe, with a large, thick walled cavitary lesion about the inferior left pleural space containing a large air and fluid level. This is significantly enlarged compared to prior CT dated 06/28/2016 and generally in keeping with findings on recent radiographs. There is evidence  of a prior right upper lobe wedge resection with new, dense consolidation of the paramedian right upper lobe (series 6, image 34). There is likewise very extensive nodular and cavitary airspace disease of the right lung, most conspicuous in the apical right upper lobe which is worsened in comparison CT dated 06/28/2016. Small left pleural effusion. Upper Abdomen: No acute abnormality. Musculoskeletal: No chest wall abnormality. No acute or significant osseous findings. Review of the MIP images confirms the above findings. IMPRESSION: 1.  Negative examination for pulmonary embolism. 2. Redemonstrated postoperative findings of left upper lobectomy. There is extensive, nodular and cavitary airspace disease of the remaining left lower lobe, with a large, thick walled cavitary lesion about the inferior left pleural space containing a large air and fluid level. This is significantly enlarged compared to prior CT dated 06/28/2016 and generally in keeping with findings on recent radiographs. There is evidence of a prior right upper lobe wedge resection with new, dense consolidation of the paramedian right upper lobe (series 6, image 34). There is likewise very extensive nodular and cavitary airspace disease of the right lung, most conspicuous in the apical right upper lobe which is worsened in comparison CT dated 06/28/2016. Small left pleural effusion. 3. Overall constellation of findings is generally indicative of ongoing chronic infection possibly with underlying findings of metastatic pulmonary disease. Dense consolidation of the paramedian right upper lobe is the most concerning for malignancy, although could represent acute airspace disease, and  could be further evaluated by PET-CT for abnormal FDG avidity. Interval CT or PET imaging dated after 06/28/2016 would be helpful for comparison if available. 4.  Coronary artery disease and aortic atherosclerosis. Electronically Signed   By: Eddie Candle M.D.   On: 05/01/2019 16:49   Dg Chest Port 1 View  Result Date: 05/01/2019 CLINICAL DATA:  Increasing shortness of breath, lung cancer EXAM: PORTABLE CHEST 1 VIEW COMPARISON:  02/11/2019 FINDINGS: There has been an interval increase in dense, masslike opacity of the apical and paramedian right upper lobe. Redemonstrated extensive scarring and volume loss of the left lung with a large cavitary lesion at the left lung base. Diffuse, coarse interstitial opacity of the aerated portions of the right lung. The heart and mediastinal contours are largely obscured. IMPRESSION: There has been an interval increase in dense, masslike opacity of the apical and paramedian right upper lobe. Redemonstrated extensive scarring and volume loss of the left lung with a large cavitary lesion at the left lung base. Diffuse, coarse interstitial opacity of the aerated portions of the right lung. The heart and mediastinal contours are largely obscured. Consider CT to further evaluate for malignant recurrence or acutely superimposed airspace disease. Electronically Signed   By: Eddie Candle M.D.   On: 05/01/2019 14:30    EKG:   Orders placed or performed during the hospital encounter of 05/01/19  . ED EKG  . ED EKG      Management plans discussed with the patient, wife  and they are in agreement.  CODE STATUS:     Code Status Orders  (From admission, onward)         Start     Ordered   05/01/19 1840  Do not attempt resuscitation (DNR)  Continuous    Question Answer Comment  In the event of cardiac or respiratory ARREST Do not call a "code blue"   In the event of cardiac or respiratory ARREST Do not perform Intubation, CPR, defibrillation or ACLS   In the event  of cardiac  or respiratory ARREST Use medication by any route, position, wound care, and other measures to relive pain and suffering. May use oxygen, suction and manual treatment of airway obstruction as needed for comfort.   Comments nmp      05/01/19 1840        Code Status History    Date Active Date Inactive Code Status Order ID Comments User Context   05/01/2019 1840 05/01/2019 1840 DNR [id number]  Epifanio Lesches, MD ED   02/09/2019 0502 02/12/2019 1608 Full Code [id number]  Harrie Foreman, MD ED   01/15/2019 0819 01/15/2019 1333 Full Code [id number]  [name], MD ED   07/20/2016 1544 07/29/2016 2013 Full Code [id number]  Henreitta Leber, MD Inpatient   06/28/2016 2237 06/30/2016 1627 Full Code [id number]  Hugelmeyer, Ubaldo Glassing, DO Inpatient   Advance Care Planning Activity      TOTAL TIME TAKING CARE OF THIS PATIENT: 45  minutes.   Note: This dictation was prepared with Dragon dictation along with smaller phrase technology. Any transcriptional errors that result from this process are unintentional.   _0 @  on 05/05/2019 at 1:54 PM  Between 7am to 6pm - Pager - [phone]  After 6pm go to [email or web address] - password [email or web address] [facility]  [place] [facility]  Office  [phone]  CC: Primary care physician; Asencion Noble, MD

## 2019-05-05 NOTE — Discharge Instructions (Signed)
Outpatient follow-up with palliative care/hospice

## 2019-05-05 NOTE — Consult Note (Addendum)
PHARMACY CONSULT NOTE - FOLLOW UP  Pharmacy Consult for Electrolyte Monitoring and Replacement   Recent Labs: Potassium (mmol/L)  Date Value  05/05/2019 3.2 (L)  12/12/2013 4.2   Magnesium (mg/dL)  Date Value  05/02/2019 2.1   Calcium (mg/dL)  Date Value  05/05/2019 9.0   Calcium, Total (mg/dL)  Date Value  12/12/2013 9.6   Albumin (g/dL)  Date Value  05/01/2019 2.9 (L)  03/28/2019 3.5 (L)  05/09/2012 3.9   Phosphorus (mg/dL)  Date Value  06/28/2016 3.7   Sodium (mmol/L)  Date Value  05/05/2019 137  03/28/2019 134  12/12/2013 135 (L)   Corrected Ca: 9.68 Scr 1.47 >> 1.41 >> 1.36 >>  Assessment: Patient remains hypokalemic and on scheduled KCL PO 40 mEq BID since 05/02/2019. Patient is also taking furosemide IV 20 mg Q12H. Due to renal function, will cautiously replace electrolytes.   Potassium was scheduled to be rechecked at @1600  yesterday, as well as phosphorus and magnesium levels for today, but orders were canceled by palliative. However, per palliative consult the patient does not want further labs or diagnostics. Patient will not need additional potassium replacement, other than what is scheduled.   Goal of Therapy:  Electrolytes WNL.   Plan:  Will continue KCL PO 40 mEq BID. Will not recheck potassium with AM labs tomorrow.   Rowland Lathe ,PharmD Clinical Pharmacist 05/05/2019 7:17 AM

## 2019-05-05 NOTE — Plan of Care (Signed)

## 2019-05-05 NOTE — Plan of Care (Signed)
  Problem: Clinical Measurements: Goal: Respiratory complications will improve Outcome: Adequate for Discharge   Problem: Coping: Goal: Level of anxiety will decrease Outcome: Adequate for Discharge

## 2019-05-06 NOTE — Progress Notes (Deleted)
[place]  [phone] [phone]  ID: [name] OB: 10-Jan-1946  MR#: [id number]  LEX#:[id number]  Patient Care Team: Asencion Noble, MD as PCP - General (Internal Medicine) Minna Merritts, MD as PCP - Cardiology (Cardiology) Minna Merritts, MD (Cardiology)  CHIEF COMPLAINT:  Iron deficiency anemia.  INTERVAL HISTORY: Patient returns to clinic today for repeat laboratory and further evaluation.  He continues to have chronic weakness and fatigue, but otherwise feels well. He has no neurologic complaints.  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  He denies any chest pain, cough, hemoptysis, or shortness of breath.  He has a good appetite and denies weight loss.  He denies any nausea, vomiting, constipation, or diarrhea.  He denies any melena or hematochezia.  He has no urinary complaints.  Patient offers no further specific complaints today.  REVIEW OF SYSTEMS:   Review of Systems  Constitutional: Positive for malaise/fatigue. Negative for fever and weight loss.  Respiratory: Negative.  Negative for cough, hemoptysis and shortness of breath.   Cardiovascular: Negative.  Negative for chest pain and leg swelling.  Gastrointestinal: Negative.  Negative for abdominal pain, blood in stool, diarrhea and melena.  Genitourinary: Negative.  Negative for hematuria.  Musculoskeletal: Negative.  Negative for back pain.  Skin: Negative.  Negative for rash.  Neurological: Positive for weakness. Negative for sensory change, focal weakness and headaches.  Psychiatric/Behavioral: Negative.  The patient is not nervous/anxious.     As per HPI. Otherwise, a complete review of systems is negative.  PAST MEDICAL HISTORY: Past Medical History:  Diagnosis Date   Acute MI [facility])    pt denies   Arthritis of lumbar spine    Asthma    Clotting disorder ([place])    Collapsed lung    x3    COPD (chronic obstructive pulmonary disease) ([place])    Dyspnea    History of benign thyroid tumor    Lung cancer ([place])    Mycobacterium avium complex ([place]) 06/24/2016   Pneumonia    Pneumothorax    Small cell carcinoma of lung ([place])    Spontaneous pneumothorax     PAST SURGICAL HISTORY: Past Surgical History:  Procedure Laterality Date   APPENDECTOMY     benign thryoid nodule resected     CARDIAC CATHETERIZATION  2013   [facility]   CHEST TUBE INSERTION     COLONOSCOPY N/A 08/30/2015   Procedure: COLONOSCOPY;  Surgeon: Rogene Houston, MD;  Location: [facility];  Service: Endoscopy;  Laterality: N/A;  1:45   left upper lobectomy for small cell lung cancer     VATS R thoracotomy      FAMILY HISTORY: Family History  Problem Relation Age of Onset   Heart disease Mother    Colon cancer Father    Heart failure Brother     ADVANCED DIRECTIVES (Y/N):  N  HEALTH MAINTENANCE: Social History   Tobacco Use   Smoking status: Former Smoker    Packs/day: 3.00    Years: 30.00    Pack years: 90.00    Types: Cigars    Quit date: 03/02/1992    Years since quitting: 27.1   Smokeless tobacco: Never Used   Tobacco comment: quit 22 yrs ago  Substance Use Topics   Alcohol use: No    Alcohol/week: 0.0 standard drinks   Drug use: No     Colonoscopy:  PAP:  Bone density:  Lipid  panel:  Allergies  Allergen Reactions   Other Other (See Comments)    Hydromet cough syrup-causes GI upset   Codeine Nausea And Vomiting    Current Outpatient Medications  Medication Sig Dispense Refill   acetaminophen (TYLENOL) 325 MG tablet Take 2 tablets (650 mg total) by mouth every 6 (six) hours as needed for mild pain (or Fever >/= 101).     albuterol (PROVENTIL HFA;VENTOLIN HFA) 108 (90 Base) MCG/ACT inhaler Inhale 2 puffs into the lungs every 6 (six) hours as needed for wheezing or shortness of breath. 1 Inhaler 12   albuterol (PROVENTIL) (2.5 MG/3ML) 0.083% nebulizer  solution Take 3 mLs (2.5 mg total) by nebulization 2 (two) times daily. With chest percussion treatment. May also use every 6 hrs as needed for increased shortness of breath, wheezing, chest tightness 360 mL 3   ALPRAZolam (XANAX) 0.25 MG tablet Take 0.5-1 tablets (0.125-0.25 mg total) by mouth 2 (two) times daily as needed for anxiety or sleep. Use as needed for anxiety if all other measures fail.  Start with half tablet initially to see how you respond to this medication 30 tablet 5   aspirin 81 MG EC tablet Take 81 mg by mouth daily. Swallow whole.     docusate sodium (COLACE) 100 MG capsule Take 1 capsule (100 mg total) by mouth 2 (two) times daily. 10 capsule 0   Fluticasone-Salmeterol (ADVAIR DISKUS) 100-50 MCG/DOSE AEPB Inhale 1 puff into the lungs 2 (two) times daily.     guaiFENesin (MUCINEX) 600 MG 12 hr tablet Take 600 mg by mouth daily.      prednisoLONE acetate (PRED FORTE) 1 % ophthalmic suspension Place 1 drop into the left eye 2 (two) times daily.      predniSONE (DELTASONE) 20 MG tablet Take 1 tablet (20 mg total) by mouth daily with breakfast. 15 tablet 0   traMADol (ULTRAM) 50 MG tablet Take 1 tablet (50 mg total) by mouth every 6 (six) hours as needed. Takes 2 tablets 4 times daily as needed for pain. 30 tablet 0   No current facility-administered medications for this visit.     OBJECTIVE: There were no vitals filed for this visit.   There is no height or weight on file to calculate BMI.    ECOG FS:0 - Asymptomatic  General: Well-developed, well-nourished, no acute distress. Eyes: Pink conjunctiva, anicteric sclera. HEENT: Normocephalic, moist mucous membranes. Lungs: Clear to auscultation bilaterally. Heart: Regular rate and rhythm. No rubs, murmurs, or gallops. Abdomen: Soft, nontender, nondistended. No organomegaly noted, normoactive bowel sounds. Musculoskeletal: No edema, cyanosis, or clubbing. Neuro: Alert, answering all questions appropriately. Cranial nerves  grossly intact. Skin: No rashes or petechiae noted. Psych: Normal affect.  LAB RESULTS:  Lab Results  Component Value Date   NA 137 05/05/2019   K 3.2 (L) 05/05/2019   CL 93 (L) 05/05/2019   CO2 35 (H) 05/05/2019   GLUCOSE 103 (H) 05/05/2019   BUN 35 (H) 05/05/2019   CREATININE 1.43 (H) 05/05/2019   CALCIUM 9.0 05/05/2019   PROT 7.0 05/01/2019   ALBUMIN 2.9 (L) 05/01/2019   AST 16 05/01/2019   ALT 16 05/01/2019   ALKPHOS 70 05/01/2019   BILITOT 0.6 05/01/2019   GFRNONAA 49 (L) 05/05/2019   GFRAA 56 (L) 05/05/2019    Lab Results  Component Value Date   WBC 19.9 (H) 05/03/2019   NEUTROABS 15.6 (H) 05/01/2019   HGB 8.8 (L) 05/03/2019   HCT 29.6 (L) 05/03/2019   MCV 89.4 05/03/2019  PLT 517 (H) 05/03/2019   Lab Results  Component Value Date   IRON 23 (L) 12/05/2018   TIBC 233 (L) 12/05/2018   IRONPCTSAT 10 (L) 12/05/2018   Lab Results  Component Value Date   FERRITIN 462 (H) 12/05/2018     STUDIES: Ct Angio Chest Pe W/cm &/or Wo Cm  Result Date: 05/01/2019 CLINICAL DATA:  Shortness of breath, productive cough EXAM: CT ANGIOGRAPHY CHEST WITH CONTRAST TECHNIQUE: Multidetector CT imaging of the chest was performed using the standard protocol during bolus administration of intravenous contrast. Multiplanar CT image reconstructions and MIPs were obtained to evaluate the vascular anatomy. CONTRAST:  2m OMNIPAQUE IOHEXOL 350 MG/ML SOLN COMPARISON:  Same day chest radiograph, CT chest, 06/28/2016 FINDINGS: Cardiovascular: Satisfactory opacification of the pulmonary arteries to the segmental level. No evidence of pulmonary embolism. Normal heart size. Scattered left coronary artery calcifications. No pericardial effusion. Aortic atherosclerosis. Mediastinum/Nodes: No enlarged mediastinal, hilar, or axillary lymph nodes. Thyroid gland, trachea, and esophagus demonstrate no significant findings. Lungs/Pleura: Redemonstrated postoperative findings of left upper lobectomy. There is  extensive, nodular and cavitary airspace disease of the remaining left lower lobe, with a large, thick walled cavitary lesion about the inferior left pleural space containing a large air and fluid level. This is significantly enlarged compared to prior CT dated 06/28/2016 and generally in keeping with findings on recent radiographs. There is evidence of a prior right upper lobe wedge resection with new, dense consolidation of the paramedian right upper lobe (series 6, image 34). There is likewise very extensive nodular and cavitary airspace disease of the right lung, most conspicuous in the apical right upper lobe which is worsened in comparison CT dated 06/28/2016. Small left pleural effusion. Upper Abdomen: No acute abnormality. Musculoskeletal: No chest wall abnormality. No acute or significant osseous findings. Review of the MIP images confirms the above findings. IMPRESSION: 1.  Negative examination for pulmonary embolism. 2. Redemonstrated postoperative findings of left upper lobectomy. There is extensive, nodular and cavitary airspace disease of the remaining left lower lobe, with a large, thick walled cavitary lesion about the inferior left pleural space containing a large air and fluid level. This is significantly enlarged compared to prior CT dated 06/28/2016 and generally in keeping with findings on recent radiographs. There is evidence of a prior right upper lobe wedge resection with new, dense consolidation of the paramedian right upper lobe (series 6, image 34). There is likewise very extensive nodular and cavitary airspace disease of the right lung, most conspicuous in the apical right upper lobe which is worsened in comparison CT dated 06/28/2016. Small left pleural effusion. 3. Overall constellation of findings is generally indicative of ongoing chronic infection possibly with underlying findings of metastatic pulmonary disease. Dense consolidation of the paramedian right upper lobe is the most  concerning for malignancy, although could represent acute airspace disease, and could be further evaluated by PET-CT for abnormal FDG avidity. Interval CT or PET imaging dated after 06/28/2016 would be helpful for comparison if available. 4.  Coronary artery disease and aortic atherosclerosis. Electronically Signed   By: AEddie CandleM.D.   On: 05/01/2019 16:49   Dg Chest Port 1 View  Result Date: 05/01/2019 CLINICAL DATA:  Increasing shortness of breath, lung cancer EXAM: PORTABLE CHEST 1 VIEW COMPARISON:  02/11/2019 FINDINGS: There has been an interval increase in dense, masslike opacity of the apical and paramedian right upper lobe. Redemonstrated extensive scarring and volume loss of the left lung with a large cavitary lesion at the left  lung base. Diffuse, coarse interstitial opacity of the aerated portions of the right lung. The heart and mediastinal contours are largely obscured. IMPRESSION: There has been an interval increase in dense, masslike opacity of the apical and paramedian right upper lobe. Redemonstrated extensive scarring and volume loss of the left lung with a large cavitary lesion at the left lung base. Diffuse, coarse interstitial opacity of the aerated portions of the right lung. The heart and mediastinal contours are largely obscured. Consider CT to further evaluate for malignant recurrence or acutely superimposed airspace disease. Electronically Signed   By: Eddie Candle M.D.   On: 05/01/2019 14:30    ASSESSMENT: Iron deficiency anemia.  PLAN:    1.  Iron deficiency anemia: Patient's hemoglobin has trended down slightly and he has decreased iron stores. Previously, the remainder of his laboratory work was either negative or within normal limits.  We will proceed with one infusion of 510 mg IV Feraheme today.  He does not require second infusion.  Return to clinic in 4 months with repeat laboratory work, further evaluation, and consideration of additional treatment.    2.  MAC:  Continue treatment and follow-up her pulmonology.   3.  Elevated ferritin: Likely secondary to acute phase reactant.  Monitor. 4.  Leukocytosis: Chronic and unchanged.  Likely reactive, monitor.   5.  Thrombocytosis: Resolved.    Patient expressed understanding and was in agreement with this plan. He also understands that He can call clinic at any time with any questions, concerns, or complaints.    Lloyd Huger, MD   05/06/2019 10:11 AM

## 2019-05-08 ENCOUNTER — Inpatient Hospital Stay: Payer: Medicare Other | Admitting: Oncology

## 2019-05-08 ENCOUNTER — Inpatient Hospital Stay: Payer: Medicare Other

## 2019-05-16 ENCOUNTER — Telehealth: Payer: Self-pay | Admitting: Cardiovascular Disease

## 2019-05-16 NOTE — Telephone Encounter (Signed)
Left voicemail message to call back in regards to some orders we received for Dr. Rockey Situ to sign.

## 2019-05-16 NOTE — Telephone Encounter (Signed)
Spoke with Pamala Hurry at [facility] and reviewed that orders we received were reviewed with provider and advised that they should go to patients primary care provider to be signed. She was appreciative for the call back and states they will reach out to Dr. Willey Blade for further orders.

## 2019-05-22 ENCOUNTER — Ambulatory Visit: Payer: Medicare Other | Admitting: Cardiovascular Disease

## 2019-05-30 DEATH — deceased

## 2021-04-09 IMAGING — US RIGHT LOWER EXTREMITY VENOUS ULTRASOUND
1 series · 13 of 24 positions shown · non-contrast
Comparison: None.

CLINICAL DATA: RIGHT leg swelling



[Series 1: right lower extremity venous ultrasound · 0.08mm/px · 13 of 31 slices shown]
[im 1/31]
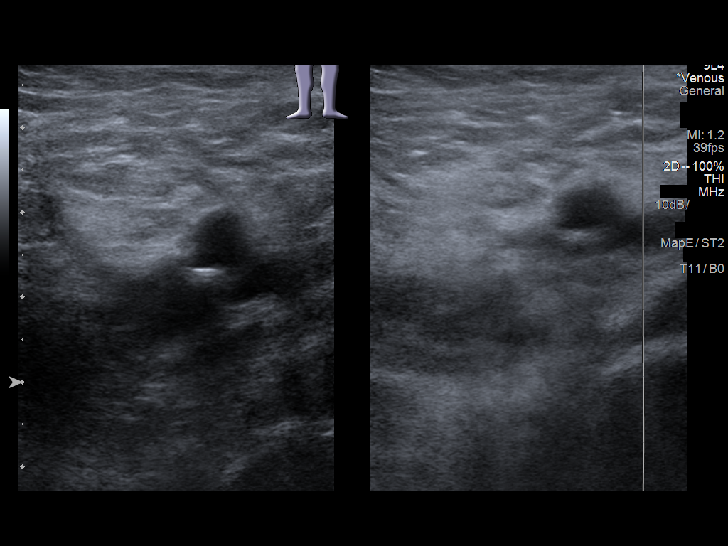
[im 3/31]
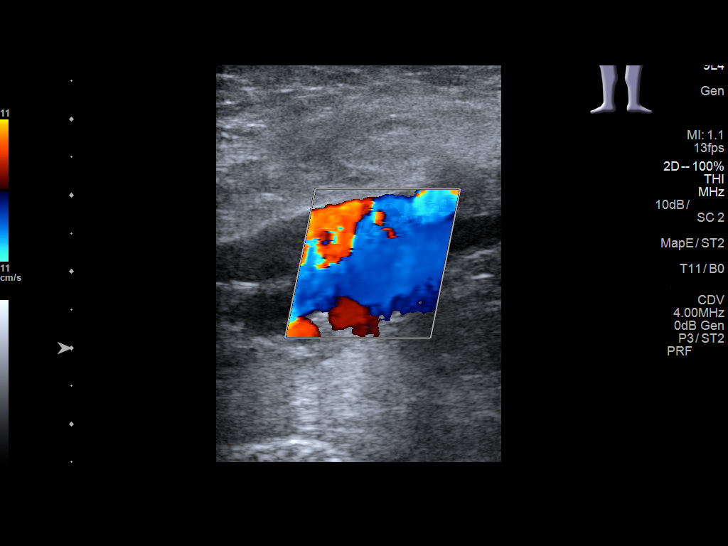
[im 6/31]
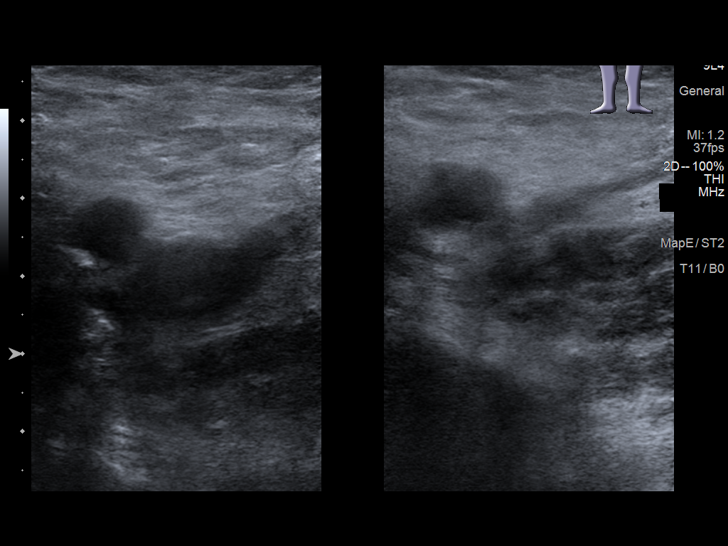
[im 8/31]
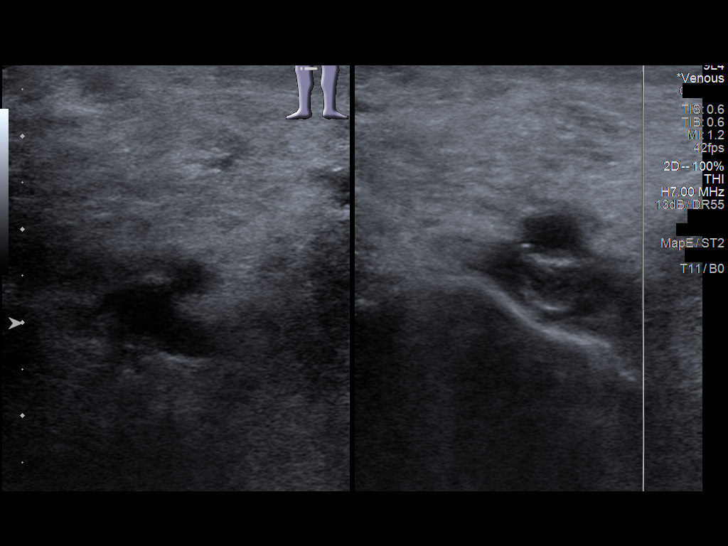
[im 11/31]
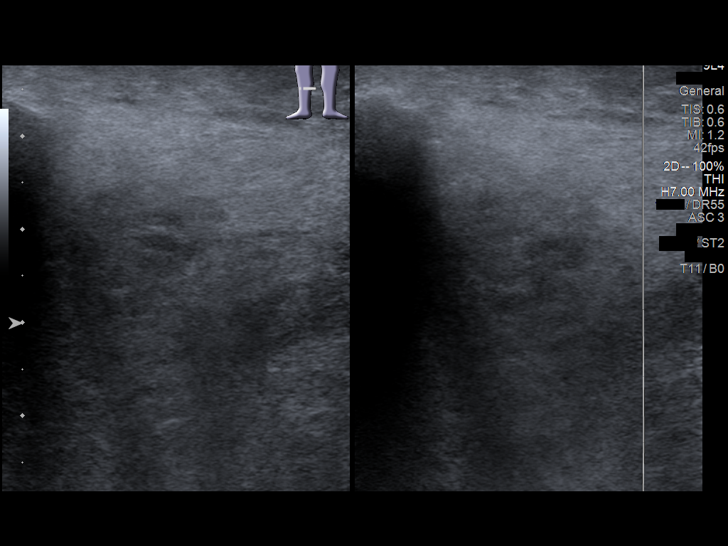
[im 14/31]
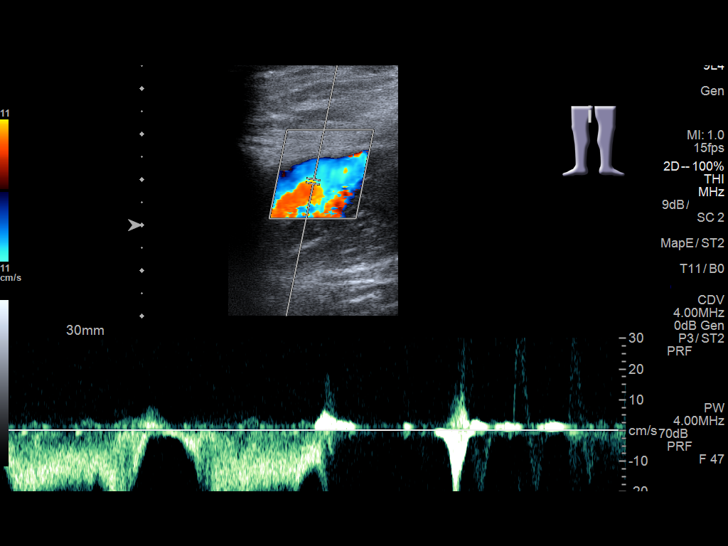
[im 16/31]
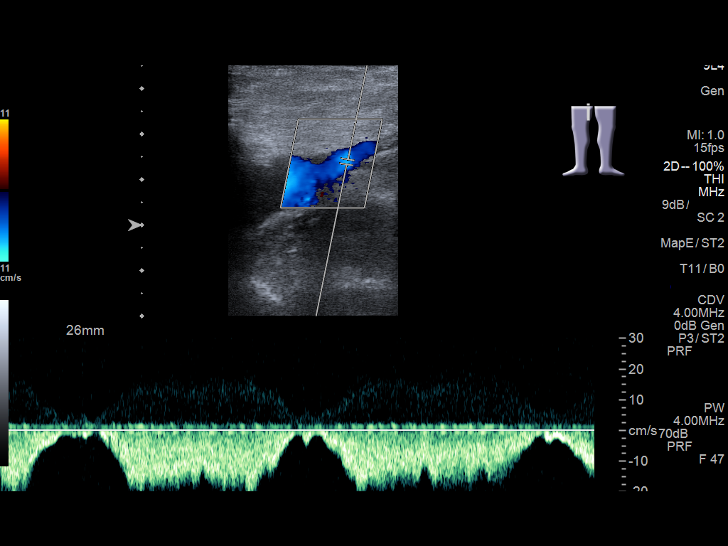
[im 17/31]
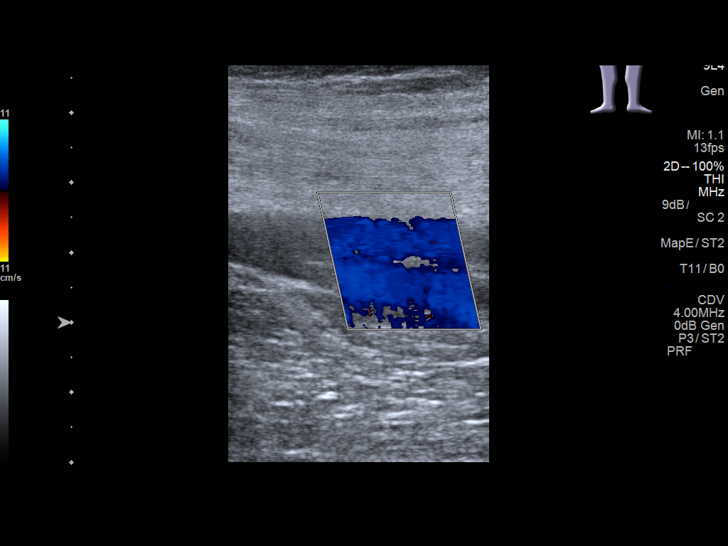
[im 20/31]
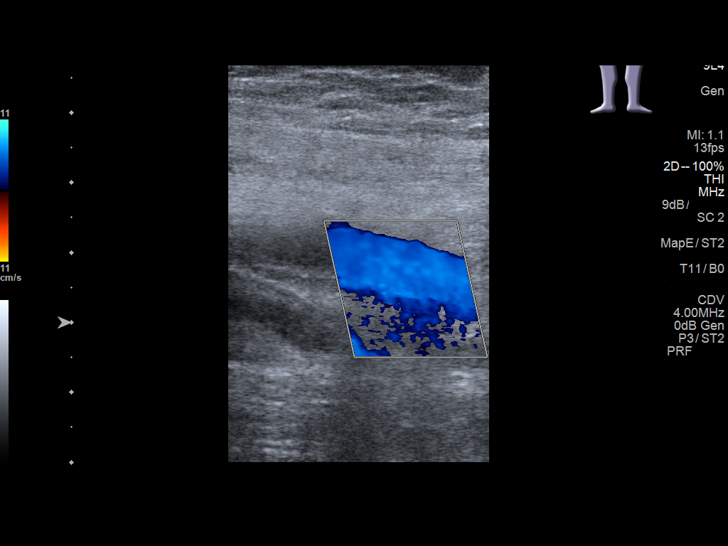
[im 23/31]
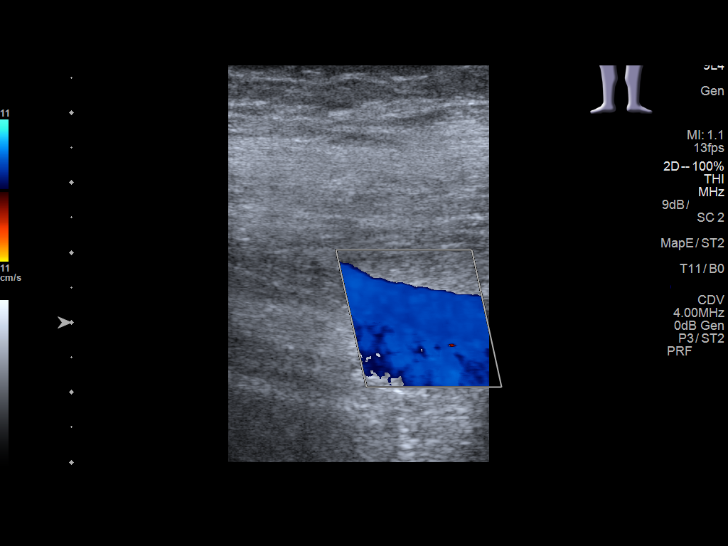
[im 25/31]
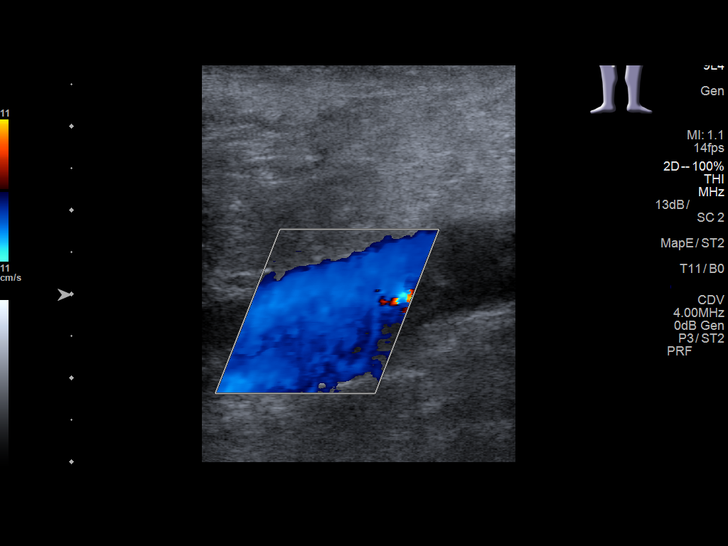
[im 28/31]
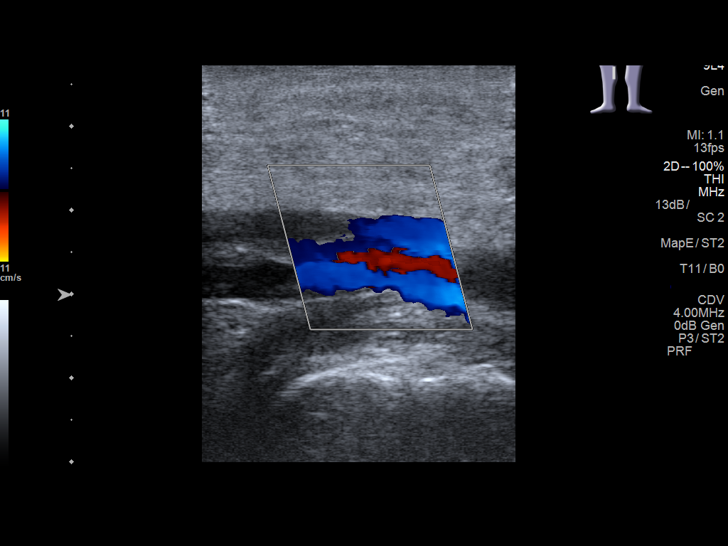
[im 31/31]
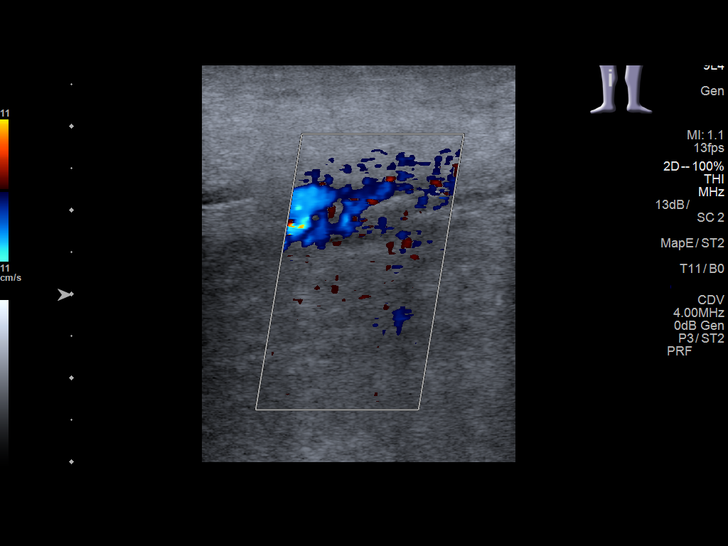

[13 of 24 positions shown; findings below may reference images not displayed]

FINDINGS: Contralateral Common Femoral Vein: Respiratory phasicity is normal
and symmetric with the symptomatic side. No evidence of thrombus.
Normal compressibility.

Common Femoral Vein: No evidence of thrombus. Normal
compressibility, respiratory phasicity and response to augmentation.

Saphenofemoral Junction: No evidence of thrombus. Normal
compressibility and flow on color Doppler imaging.

Profunda Femoral Vein: No evidence of thrombus. Normal
compressibility and flow on color Doppler imaging.

Femoral Vein: No evidence of thrombus. Normal compressibility,
respiratory phasicity and response to augmentation.

Popliteal Vein: No evidence of thrombus. Normal compressibility,
respiratory phasicity and response to augmentation.

Calf Veins: No evidence of thrombus. Normal compressibility and flow
on color Doppler imaging.

Superficial Great Saphenous Vein: No evidence of thrombus. Normal
compressibility.
IMPRESSION: No evidence of deep venous thrombosis.
